# Patient Record
Sex: Male | Born: 1950 | Race: White | Hispanic: No | Smoking: Current every day smoker
Health system: Southern US, Community
[De-identification: ages and names within clinical notes are randomized; demographics above are authoritative.]

## PROBLEM LIST (undated history)

## (undated) DIAGNOSIS — J90 Pleural effusion, not elsewhere classified: Secondary | ICD-10-CM

## (undated) DIAGNOSIS — M47816 Spondylosis without myelopathy or radiculopathy, lumbar region: Secondary | ICD-10-CM

## (undated) DIAGNOSIS — K429 Umbilical hernia without obstruction or gangrene: Secondary | ICD-10-CM

## (undated) DIAGNOSIS — K5790 Diverticulosis of intestine, part unspecified, without perforation or abscess without bleeding: Secondary | ICD-10-CM

## (undated) DIAGNOSIS — C3490 Malignant neoplasm of unspecified part of unspecified bronchus or lung: Secondary | ICD-10-CM

## (undated) DIAGNOSIS — R0602 Shortness of breath: Secondary | ICD-10-CM

## (undated) DIAGNOSIS — K861 Other chronic pancreatitis: Secondary | ICD-10-CM

## (undated) DIAGNOSIS — I10 Essential (primary) hypertension: Secondary | ICD-10-CM

## (undated) DIAGNOSIS — Z86010 Personal history of colonic polyps: Secondary | ICD-10-CM

## (undated) DIAGNOSIS — C73 Malignant neoplasm of thyroid gland: Secondary | ICD-10-CM

## (undated) DIAGNOSIS — K219 Gastro-esophageal reflux disease without esophagitis: Secondary | ICD-10-CM

## (undated) DIAGNOSIS — Z860101 Personal history of adenomatous and serrated colon polyps: Secondary | ICD-10-CM

## (undated) DIAGNOSIS — R188 Other ascites: Secondary | ICD-10-CM

## (undated) DIAGNOSIS — Z9981 Dependence on supplemental oxygen: Secondary | ICD-10-CM

## (undated) DIAGNOSIS — K589 Irritable bowel syndrome without diarrhea: Secondary | ICD-10-CM

## (undated) DIAGNOSIS — I509 Heart failure, unspecified: Secondary | ICD-10-CM

## (undated) HISTORY — DX: Malignant neoplasm of thyroid gland: C73

## (undated) HISTORY — DX: Spondylosis without myelopathy or radiculopathy, lumbar region: M47.816

## (undated) HISTORY — DX: Personal history of adenomatous and serrated colon polyps: Z86.0101

## (undated) HISTORY — DX: Umbilical hernia without obstruction or gangrene: K42.9

## (undated) HISTORY — DX: Gastro-esophageal reflux disease without esophagitis: K21.9

## (undated) HISTORY — DX: Hemochromatosis, unspecified: E83.119

## (undated) HISTORY — DX: Diverticulosis of intestine, part unspecified, without perforation or abscess without bleeding: K57.90

## (undated) HISTORY — DX: Pleural effusion, not elsewhere classified: J90

## (undated) HISTORY — DX: Irritable bowel syndrome, unspecified: K58.9

## (undated) HISTORY — DX: Malignant neoplasm of unspecified part of unspecified bronchus or lung: C34.90

## (undated) HISTORY — DX: Essential (primary) hypertension: I10

## (undated) HISTORY — DX: Personal history of colonic polyps: Z86.010

## (undated) HISTORY — DX: Other chronic pancreatitis: K86.1

---
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
## 1981-11-17 HISTORY — PX: LUMBAR LAMINECTOMY: SHX95

## 1998-04-04 ENCOUNTER — Encounter: Admission: RE | Admit: 1998-04-04 | Discharge: 1998-07-03 | Payer: Self-pay | Admitting: Family Medicine

## 2001-01-19 ENCOUNTER — Ambulatory Visit (HOSPITAL_COMMUNITY): Admission: RE | Admit: 2001-01-19 | Discharge: 2001-01-19 | Payer: Self-pay | Admitting: Oncology

## 2001-04-09 ENCOUNTER — Encounter (HOSPITAL_COMMUNITY): Admission: RE | Admit: 2001-04-09 | Discharge: 2001-07-08 | Payer: Self-pay | Admitting: Oncology

## 2001-07-13 ENCOUNTER — Encounter: Payer: Self-pay | Admitting: Family Medicine

## 2001-07-13 ENCOUNTER — Encounter: Admission: RE | Admit: 2001-07-13 | Discharge: 2001-07-13 | Payer: Self-pay | Admitting: Family Medicine

## 2001-09-13 ENCOUNTER — Encounter (HOSPITAL_COMMUNITY): Admission: RE | Admit: 2001-09-13 | Discharge: 2001-12-12 | Payer: Self-pay | Admitting: Oncology

## 2001-11-17 HISTORY — PX: OTHER SURGICAL HISTORY: SHX169

## 2002-02-24 ENCOUNTER — Encounter (HOSPITAL_COMMUNITY): Admission: RE | Admit: 2002-02-24 | Discharge: 2002-05-25 | Payer: Self-pay | Admitting: Oncology

## 2002-05-25 ENCOUNTER — Encounter (HOSPITAL_COMMUNITY): Admission: RE | Admit: 2002-05-25 | Discharge: 2002-08-23 | Payer: Self-pay | Admitting: Oncology

## 2002-10-07 ENCOUNTER — Encounter (HOSPITAL_COMMUNITY): Admission: RE | Admit: 2002-10-07 | Discharge: 2003-01-05 | Payer: Self-pay | Admitting: Oncology

## 2003-02-01 ENCOUNTER — Encounter: Payer: Self-pay | Admitting: Family Medicine

## 2003-02-01 ENCOUNTER — Encounter: Admission: RE | Admit: 2003-02-01 | Discharge: 2003-02-01 | Payer: Self-pay | Admitting: Family Medicine

## 2003-03-01 ENCOUNTER — Encounter (HOSPITAL_COMMUNITY): Admission: RE | Admit: 2003-03-01 | Discharge: 2003-05-30 | Payer: Self-pay | Admitting: Oncology

## 2003-09-21 ENCOUNTER — Encounter (HOSPITAL_COMMUNITY): Admission: RE | Admit: 2003-09-21 | Discharge: 2003-12-20 | Payer: Self-pay | Admitting: Oncology

## 2004-01-04 ENCOUNTER — Encounter (HOSPITAL_COMMUNITY): Admission: RE | Admit: 2004-01-04 | Discharge: 2004-04-03 | Payer: Self-pay | Admitting: Oncology

## 2004-02-27 ENCOUNTER — Encounter: Admission: RE | Admit: 2004-02-27 | Discharge: 2004-02-27 | Payer: Self-pay | Admitting: Family Medicine

## 2004-05-01 ENCOUNTER — Ambulatory Visit (HOSPITAL_COMMUNITY): Admission: RE | Admit: 2004-05-01 | Discharge: 2004-05-01 | Payer: Self-pay | Admitting: Orthopedic Surgery

## 2004-05-09 ENCOUNTER — Encounter (HOSPITAL_COMMUNITY): Admission: RE | Admit: 2004-05-09 | Discharge: 2004-08-07 | Payer: Self-pay | Admitting: Oncology

## 2004-05-24 ENCOUNTER — Encounter: Admission: RE | Admit: 2004-05-24 | Discharge: 2004-05-24 | Payer: Self-pay | Admitting: Family Medicine

## 2004-10-03 ENCOUNTER — Encounter (HOSPITAL_COMMUNITY): Admission: RE | Admit: 2004-10-03 | Discharge: 2005-01-01 | Payer: Self-pay | Admitting: Oncology

## 2004-12-16 ENCOUNTER — Ambulatory Visit: Payer: Self-pay | Admitting: Oncology

## 2005-01-14 ENCOUNTER — Encounter (HOSPITAL_COMMUNITY): Admission: RE | Admit: 2005-01-14 | Discharge: 2005-04-14 | Payer: Self-pay | Admitting: Oncology

## 2005-02-13 ENCOUNTER — Ambulatory Visit: Payer: Self-pay | Admitting: Oncology

## 2005-04-28 ENCOUNTER — Ambulatory Visit: Payer: Self-pay | Admitting: Oncology

## 2005-06-27 ENCOUNTER — Ambulatory Visit: Payer: Self-pay | Admitting: Oncology

## 2005-08-28 ENCOUNTER — Encounter (HOSPITAL_COMMUNITY): Admission: RE | Admit: 2005-08-28 | Discharge: 2005-09-15 | Payer: Self-pay | Admitting: Family Medicine

## 2006-02-17 ENCOUNTER — Encounter (HOSPITAL_COMMUNITY): Admission: RE | Admit: 2006-02-17 | Discharge: 2006-05-18 | Payer: Self-pay | Admitting: Family Medicine

## 2006-04-23 ENCOUNTER — Encounter: Admission: RE | Admit: 2006-04-23 | Discharge: 2006-04-23 | Payer: Self-pay | Admitting: Orthopedic Surgery

## 2006-05-22 ENCOUNTER — Encounter (HOSPITAL_COMMUNITY): Admission: RE | Admit: 2006-05-22 | Discharge: 2006-07-02 | Payer: Self-pay | Admitting: Family Medicine

## 2007-02-01 ENCOUNTER — Encounter (HOSPITAL_COMMUNITY): Admission: RE | Admit: 2007-02-01 | Discharge: 2007-04-27 | Payer: Self-pay | Admitting: Family Medicine

## 2007-05-24 ENCOUNTER — Encounter: Admission: RE | Admit: 2007-05-24 | Discharge: 2007-05-24 | Payer: Self-pay | Admitting: Orthopedic Surgery

## 2007-06-22 ENCOUNTER — Encounter (HOSPITAL_COMMUNITY): Admission: RE | Admit: 2007-06-22 | Discharge: 2007-08-23 | Payer: Self-pay | Admitting: Family Medicine

## 2007-09-28 ENCOUNTER — Encounter (HOSPITAL_COMMUNITY): Admission: RE | Admit: 2007-09-28 | Discharge: 2007-11-25 | Payer: Self-pay | Admitting: Family Medicine

## 2007-12-02 ENCOUNTER — Ambulatory Visit: Payer: Self-pay | Admitting: Internal Medicine

## 2007-12-15 ENCOUNTER — Ambulatory Visit: Payer: Self-pay | Admitting: Internal Medicine

## 2007-12-15 ENCOUNTER — Encounter: Payer: Self-pay | Admitting: Internal Medicine

## 2007-12-22 ENCOUNTER — Encounter (HOSPITAL_COMMUNITY): Admission: RE | Admit: 2007-12-22 | Discharge: 2008-03-21 | Payer: Self-pay | Admitting: Family Medicine

## 2008-01-07 ENCOUNTER — Encounter: Admission: RE | Admit: 2008-01-07 | Discharge: 2008-01-07 | Payer: Self-pay | Admitting: Family Medicine

## 2008-05-08 ENCOUNTER — Ambulatory Visit (HOSPITAL_COMMUNITY): Admission: RE | Admit: 2008-05-08 | Discharge: 2008-05-08 | Payer: Self-pay | Admitting: Family Medicine

## 2008-09-06 ENCOUNTER — Encounter (HOSPITAL_COMMUNITY): Admission: RE | Admit: 2008-09-06 | Discharge: 2008-11-16 | Payer: Self-pay | Admitting: Family Medicine

## 2008-12-14 ENCOUNTER — Encounter (HOSPITAL_COMMUNITY): Admission: RE | Admit: 2008-12-14 | Discharge: 2008-12-14 | Payer: Self-pay | Admitting: Family Medicine

## 2009-04-13 ENCOUNTER — Encounter (HOSPITAL_COMMUNITY): Admission: RE | Admit: 2009-04-13 | Discharge: 2009-07-12 | Payer: Self-pay | Admitting: Family Medicine

## 2009-09-07 ENCOUNTER — Encounter (HOSPITAL_COMMUNITY): Admission: RE | Admit: 2009-09-07 | Discharge: 2009-12-06 | Payer: Self-pay | Admitting: Family Medicine

## 2010-04-10 ENCOUNTER — Encounter (HOSPITAL_COMMUNITY): Admission: RE | Admit: 2010-04-10 | Discharge: 2010-05-16 | Payer: Self-pay | Admitting: Family Medicine

## 2010-11-22 ENCOUNTER — Ambulatory Visit (HOSPITAL_COMMUNITY)
Admission: RE | Admit: 2010-11-22 | Discharge: 2010-11-22 | Payer: Self-pay | Source: Home / Self Care | Attending: Family Medicine | Admitting: Family Medicine

## 2011-02-01 LAB — CBC
HCT: 51.2 % (ref 39.0–52.0)
Hemoglobin: 18.5 g/dL — ABNORMAL HIGH (ref 13.0–17.0)
MCHC: 36.1 g/dL — ABNORMAL HIGH (ref 30.0–36.0)
RDW: 12.8 % (ref 11.5–15.5)

## 2011-02-02 LAB — CBC
MCH: 36.5 pg — ABNORMAL HIGH (ref 26.0–34.0)
MCHC: 35.9 g/dL (ref 30.0–36.0)
MCV: 101.6 fL — ABNORMAL HIGH (ref 78.0–100.0)
Platelets: 204 10*3/uL (ref 150–400)
RDW: 13.5 % (ref 11.5–15.5)
WBC: 9.8 10*3/uL (ref 4.0–10.5)

## 2011-02-11 ENCOUNTER — Encounter (HOSPITAL_COMMUNITY)
Admission: RE | Admit: 2011-02-11 | Discharge: 2011-02-11 | Disposition: A | Discharge status: Home / Self Care | Payer: BC Managed Care – PPO | Source: Ambulatory Visit | Attending: Family Medicine | Admitting: Family Medicine

## 2011-02-25 LAB — CBC
MCHC: 35.6 g/dL (ref 30.0–36.0)
Platelets: 214 10*3/uL (ref 150–400)
RDW: 13.3 % (ref 11.5–15.5)

## 2011-02-25 LAB — FERRITIN: Ferritin: 731 ng/mL — ABNORMAL HIGH (ref 22–322)

## 2011-03-03 LAB — CBC
Platelets: 234 10*3/uL (ref 150–400)
WBC: 8.6 10*3/uL (ref 4.0–10.5)

## 2011-03-03 LAB — FERRITIN: Ferritin: 951 ng/mL — ABNORMAL HIGH (ref 22–322)

## 2011-07-01 ENCOUNTER — Ambulatory Visit (HOSPITAL_BASED_OUTPATIENT_CLINIC_OR_DEPARTMENT_OTHER)
Admission: RE | Admit: 2011-07-01 | Discharge: 2011-07-01 | Disposition: A | Discharge status: Home / Self Care | Payer: BC Managed Care – PPO | Source: Ambulatory Visit | Attending: Urology | Admitting: Urology

## 2011-07-01 ENCOUNTER — Other Ambulatory Visit: Payer: Self-pay | Admitting: Urology

## 2011-07-01 DIAGNOSIS — Z01812 Encounter for preprocedural laboratory examination: Secondary | ICD-10-CM | POA: Insufficient documentation

## 2011-07-01 DIAGNOSIS — N368 Other specified disorders of urethra: Secondary | ICD-10-CM | POA: Insufficient documentation

## 2011-07-01 DIAGNOSIS — Z01810 Encounter for preprocedural cardiovascular examination: Secondary | ICD-10-CM | POA: Insufficient documentation

## 2011-07-01 DIAGNOSIS — N329 Bladder disorder, unspecified: Secondary | ICD-10-CM | POA: Insufficient documentation

## 2011-07-01 LAB — POCT I-STAT 4, (NA,K, GLUC, HGB,HCT)
Glucose, Bld: 125 mg/dL — ABNORMAL HIGH (ref 70–99)
Potassium: 3.3 mEq/L — ABNORMAL LOW (ref 3.5–5.1)

## 2011-08-08 LAB — CBC
HCT: 50.5
MCHC: 35.5
MCV: 97.9
Platelets: 264
RBC: 5.15

## 2011-08-08 LAB — FERRITIN: Ferritin: 437 — ABNORMAL HIGH (ref 22–322)

## 2011-08-14 LAB — CBC
HCT: 51.5
MCHC: 35.1
Platelets: 221
RDW: 13.8

## 2011-08-18 LAB — CBC
MCHC: 35
Platelets: 236
RBC: 5.12

## 2011-08-18 LAB — FERRITIN: Ferritin: 806 — ABNORMAL HIGH (ref 22–322)

## 2011-08-26 LAB — FERRITIN: Ferritin: 417 — ABNORMAL HIGH (ref 22–322)

## 2011-08-26 LAB — CBC
HCT: 50
Platelets: 254
RDW: 13.6
WBC: 9.1

## 2011-09-01 LAB — CBC
Hemoglobin: 18.5 — ABNORMAL HIGH
MCHC: 35
RBC: 5.5
WBC: 9.1

## 2011-09-01 LAB — FERRITIN: Ferritin: 362 — ABNORMAL HIGH (ref 22–322)

## 2011-12-18 ENCOUNTER — Encounter (HOSPITAL_COMMUNITY)
Admission: RE | Admit: 2011-12-18 | Discharge: 2011-12-18 | Disposition: A | Discharge status: Home / Self Care | Payer: BC Managed Care – PPO | Source: Ambulatory Visit | Attending: Family Medicine | Admitting: Family Medicine

## 2011-12-18 ENCOUNTER — Other Ambulatory Visit (HOSPITAL_COMMUNITY): Payer: Self-pay | Admitting: *Deleted

## 2011-12-18 NOTE — Progress Notes (Signed)
One unit theraputic phlebotomy completed usaing left antecubital fossoe; pt tolerated well.

## 2012-01-30 ENCOUNTER — Encounter (HOSPITAL_COMMUNITY)
Admission: RE | Admit: 2012-01-30 | Discharge: 2012-01-30 | Disposition: A | Discharge status: Home / Self Care | Payer: BC Managed Care – PPO | Source: Ambulatory Visit | Attending: Family Medicine | Admitting: Family Medicine

## 2012-01-30 NOTE — Progress Notes (Signed)
Patient refuses to answer questions regarding abuse,suicide, or falls. States that those questions are "awfully personal and I don't think you should be asking them".

## 2012-01-30 NOTE — Progress Notes (Signed)
Pt. States that he feels fine and is ready to go home. States that he took his bp medicine a little while ago. Gait steady. Accepted a Coke to take with him. Did not want to sit longer to see if bp came back up.

## 2012-04-01 ENCOUNTER — Encounter (HOSPITAL_COMMUNITY)
Admission: RE | Admit: 2012-04-01 | Discharge: 2012-04-01 | Disposition: A | Discharge status: Home / Self Care | Payer: BC Managed Care – PPO | Source: Ambulatory Visit | Attending: Family Medicine | Admitting: Family Medicine

## 2012-04-01 NOTE — Progress Notes (Addendum)
1315 500 cc phlebotomy right anterior forearm.  Patient tolerated well.  Taking pos. Labs received from Dr Diamantina [facility] office showing hemoglobin of 18.1 on 01/20/2012

## 2012-05-11 ENCOUNTER — Other Ambulatory Visit (HOSPITAL_COMMUNITY): Payer: Self-pay | Admitting: *Deleted

## 2012-05-12 ENCOUNTER — Encounter (HOSPITAL_COMMUNITY)
Admission: RE | Admit: 2012-05-12 | Discharge: 2012-05-12 | Disposition: A | Discharge status: Home / Self Care | Payer: BC Managed Care – PPO | Source: Ambulatory Visit | Attending: Family Medicine | Admitting: Family Medicine

## 2012-05-12 LAB — CBC
RBC: 5.04 MIL/uL (ref 4.22–5.81)
RDW: 13.3 % (ref 11.5–15.5)
WBC: 7.3 10*3/uL (ref 4.0–10.5)

## 2012-05-12 LAB — DIFFERENTIAL
Basophils Absolute: 0.1 10*3/uL (ref 0.0–0.1)
Basophils Relative: 1 % (ref 0–1)
Eosinophils Absolute: 0.4 10*3/uL (ref 0.0–0.7)
Eosinophils Relative: 5 % (ref 0–5)
Monocytes Absolute: 0.7 10*3/uL (ref 0.1–1.0)
Monocytes Relative: 10 % (ref 3–12)

## 2012-05-12 NOTE — Progress Notes (Addendum)
1230 instructions received to perform phlebotomy if hgb >17.  1245  500cc phlebotomy from right mid arm per patients request.  Tolerated well. Taking po fluids

## 2012-11-08 ENCOUNTER — Encounter (HOSPITAL_COMMUNITY): Payer: BC Managed Care – PPO

## 2012-11-08 ENCOUNTER — Other Ambulatory Visit (HOSPITAL_COMMUNITY): Payer: Self-pay | Admitting: *Deleted

## 2012-11-22 ENCOUNTER — Encounter: Payer: Self-pay | Admitting: Internal Medicine

## 2012-12-08 ENCOUNTER — Encounter: Payer: Self-pay | Admitting: Internal Medicine

## 2012-12-08 ENCOUNTER — Ambulatory Visit (AMBULATORY_SURGERY_CENTER): Payer: BC Managed Care – PPO | Admitting: *Deleted

## 2012-12-08 VITALS — Ht 72.0 in | Wt 207.0 lb

## 2012-12-08 DIAGNOSIS — Z86010 Personal history of colonic polyps: Secondary | ICD-10-CM

## 2012-12-08 DIAGNOSIS — Z1211 Encounter for screening for malignant neoplasm of colon: Secondary | ICD-10-CM

## 2012-12-08 MED ORDER — MOVIPREP 100 G PO SOLR: ORAL | Status: DC

## 2012-12-20 ENCOUNTER — Telehealth: Payer: Self-pay | Admitting: Internal Medicine

## 2012-12-20 DIAGNOSIS — Z1211 Encounter for screening for malignant neoplasm of colon: Secondary | ICD-10-CM

## 2012-12-20 MED ORDER — MOVIPREP 100 G PO SOLR: ORAL | Status: DC

## 2012-12-20 NOTE — Telephone Encounter (Signed)
Resent Moviprep to [facility] per pt's request. Did speak with patient at this time. He understands it was sent.

## 2012-12-21 ENCOUNTER — Encounter: Payer: Self-pay | Admitting: Internal Medicine

## 2012-12-21 ENCOUNTER — Ambulatory Visit (AMBULATORY_SURGERY_CENTER): Payer: BC Managed Care – PPO | Admitting: Internal Medicine

## 2012-12-21 VITALS — BP 149/95 | HR 79 | Temp 97.0°F | Resp 25 | Ht 72.0 in | Wt 207.0 lb

## 2012-12-21 DIAGNOSIS — Z1211 Encounter for screening for malignant neoplasm of colon: Secondary | ICD-10-CM

## 2012-12-21 DIAGNOSIS — D126 Benign neoplasm of colon, unspecified: Secondary | ICD-10-CM

## 2012-12-21 DIAGNOSIS — Z86010 Personal history of colonic polyps: Secondary | ICD-10-CM

## 2012-12-21 MED ORDER — DICYCLOMINE HCL 10 MG PO CAPS: 20.0000 mg | ORAL_CAPSULE | Freq: Two times a day (BID) | ORAL | Status: DC | PRN

## 2012-12-21 MED ORDER — SODIUM CHLORIDE 0.9 % IV SOLN: 500.0000 mL | INTRAVENOUS | Status: DC

## 2012-12-21 NOTE — Op Note (Signed)
 [facility] [address] [place], [address]   COLONOSCOPY PROCEDURE REPORT  PATIENT: Henry [name], Henry [name]  MR#: [id number] BIRTHDATE: [birth year] , 61  yrs. old GENDER: Male ENDOSCOPIST: Hart Carwin, MD REFERRED BY:  Lupe Carney, M.D. PROCEDURE DATE:  12/21/2012 PROCEDURE:   Colonoscopy with cold biopsy polypectomy and Colonoscopy with snare polypectomy ASA CLASS:   Class III INDICATIONS:Patient's personal history of adenomatous colon polyps and 1988adenoma,1997-hyperplastic,2002-hyperplastic,2009-adenomas.  MEDICATIONS: MAC sedation, administered by CRNA, Fentanyl-Quick Pick, and propofol (Diprivan) 450mg  IV  DESCRIPTION OF PROCEDURE:   After the risks and benefits and of the procedure were explained, informed consent was obtained.  A digital rectal exam revealed no abnormalities of the rectum.    The LB CF-[id number]  endoscope was introduced through the anus and advanced to the cecum, which was identified by both the appendix and ileocecal valve .  The quality of the prep was good, using MoviPrep .  The instrument was then slowly withdrawn as the colon was fully examined.     COLON FINDINGS: Four smooth semi-pedunculated polyps ranging between 3-13mm in size were found throughout the entire examined colon. at 120cm, 80cm, 25cm and 20cm.  A polypectomy was performed with cold forceps x2  and with a cold snare. x2   The resection was complete and the polyp tissue was completely retrieved.     Retroflexed views revealed no abnormalities. there was moderrate;yu severe diverticulosis of the left colon.    The scope was then withdrawn from the patient and the procedure completed.  COMPLICATIONS: There were no complications. ENDOSCOPIC IMPRESSION: Four semi-pedunculated polyps ranging between 3-81mm in size were found throughout the entire examined colon; polypectomy was performed with cold forceps and with a cold snare moderately severe diverticulosis of  the left colon spasm of the left colon  RECOMMENDATIONS: Await pathology results Bentyl 20 mg po bid   REPEAT EXAM: In 5 year(s)  for Colonoscopy.  cc:  _______________________________ eSignedHart Carwin, MD 12/21/2012 9:19 AM     PATIENT NAME:  Henry [name], Henry [name] MR#: [id number]

## 2012-12-21 NOTE — Patient Instructions (Addendum)
YOU HAD AN ENDOSCOPIC PROCEDURE TODAY AT THE [facility]: Refer to the procedure report that was given to you for any specific questions about what was found during the examination.  If the procedure report does not answer your questions, please call your gastroenterologist to clarify.  If you requested that your care partner not be given the details of your procedure findings, then the procedure report has been included in a sealed envelope for you to review at your convenience later.  YOU SHOULD EXPECT: Some feelings of bloating in the abdomen. Passage of more gas than usual.  Walking can help get rid of the air that was put into your GI tract during the procedure and reduce the bloating. If you had a lower endoscopy (such as a colonoscopy or flexible sigmoidoscopy) you may notice spotting of blood in your stool or on the toilet paper. If you underwent a bowel prep for your procedure, then you may not have a normal bowel movement for a few days.  DIET: Your first meal following the procedure should be a light meal and then it is ok to progress to your normal diet.  A half-sandwich or bowl of soup is an example of a good first meal.  Heavy or fried foods are harder to digest and may make you feel nauseous or bloated.  Likewise meals heavy in dairy and vegetables can cause extra gas to form and this can also increase the bloating.  Drink plenty of fluids but you should avoid alcoholic beverages for 24 hours.  You do have diverticulosis; therefore, please eat a high fiber diet to prevent diverticulitis.  ACTIVITY: Your care partner should take you home directly after the procedure.  You should plan to take it easy, moving slowly for the rest of the day.  You can resume normal activity the day after the procedure however you should NOT DRIVE or use heavy machinery for 24 hours (because of the sedation medicines used during the test).    SYMPTOMS TO REPORT IMMEDIATELY: A gastroenterologist can be  reached at any hour.  During normal business hours, 8:30 AM to 5:00 PM Monday through Friday, call [phone].  After hours and on weekends, please call the GI answering service at [phone] who will take a message and have the physician on call contact you.   Following lower endoscopy (colonoscopy or flexible sigmoidoscopy):  Excessive amounts of blood in the stool  Significant tenderness or worsening of abdominal pains  Swelling of the abdomen that is new, acute  Fever of 100F or higher  FOLLOW UP: If any biopsies were taken you will be contacted by phone or by letter within the next 1-3 weeks.  Call your gastroenterologist if you have not heard about the biopsies in 3 weeks.  Our staff will call the home number listed on your records the next business day following your procedure to check on you and address any questions or concerns that you may have at that time regarding the information given to you following your procedure. This is a courtesy call and so if there is no answer at the home number and we have not heard from you through the emergency physician on call, we will assume that you have returned to your regular daily activities without incident.  Take your bentyl if you have abd pain, and stay on a liquid diet that day per Dr. Juanda Chance.  SIGNATURES/CONFIDENTIALITY: You and/or your care partner have signed paperwork which will be entered into  your electronic medical record.  These signatures attest to the fact that that the information above on your After Visit Summary has been reviewed and is understood.  Full responsibility of the confidentiality of this discharge information lies with you and/or your care-partner.   Thank-you for choosing [facility] for your healthcare needs.

## 2012-12-21 NOTE — Progress Notes (Addendum)
Patient did not have preoperative order for IV antibiotic SSI prophylaxis. (G8918)  Patient did not experience any of the following events: a burn prior to discharge; a fall within the facility; wrong site/side/patient/procedure/implant event; or a hospital transfer or hospital admission upon discharge from the facility. (G8907)  

## 2012-12-21 NOTE — Progress Notes (Signed)
Called to room to assist during endoscopic procedure.  Patient ID and intended procedure confirmed with present staff. Received instructions for my participation in the procedure from the performing physician.  

## 2012-12-22 ENCOUNTER — Telehealth: Payer: Self-pay | Admitting: *Deleted

## 2012-12-22 NOTE — Telephone Encounter (Signed)
  Follow up Call-  Call back number 12/21/2012  Post procedure Call Back phone  # [phone]  Permission to leave phone message Yes     Patient questions:  Do you have a fever, pain , or abdominal swelling? yes Pain Score  0 *  Have you tolerated food without any problems? yes  Have you been able to return to your normal activities? yes  Do you have any questions about your discharge instructions: Diet   no Medications  no Follow up visit  no  Do you have questions or concerns about your Care? no  Actions: * If pain score is 4 or above: No action needed, pain <4.  Patient c/o intermitting localized abdomen pain as before exam. He states it's worse when he lays down and it's happen for about 2 weeks. He did tell Dr.Brodie. He has not started taking the Bentyl as prescribed. Encouraged patient to get this medication and try it. If still no improvement to call Dr.Brodie and let her know. He understands.

## 2012-12-27 ENCOUNTER — Encounter: Payer: Self-pay | Admitting: Internal Medicine

## 2013-01-28 ENCOUNTER — Telehealth: Payer: Self-pay | Admitting: Internal Medicine

## 2013-01-28 NOTE — Telephone Encounter (Signed)
Patient states he is still having pain in his stomach above the belly button. States he wakes up and feels fine, then when he eats he feels bad. Later, in the day, he feels okay again. He has noticed the pain is worse if he eats eggs. He reports a tick bite last year and is wondering if this caused Lyme disease. He would like to be seen ASAP. Scheduled with [name], PA on 01/31/13 at 10:30 AM.

## 2013-01-31 ENCOUNTER — Encounter: Payer: Self-pay | Admitting: Physician Assistant

## 2013-01-31 ENCOUNTER — Other Ambulatory Visit (INDEPENDENT_AMBULATORY_CARE_PROVIDER_SITE_OTHER): Payer: BC Managed Care – PPO

## 2013-01-31 ENCOUNTER — Ambulatory Visit (INDEPENDENT_AMBULATORY_CARE_PROVIDER_SITE_OTHER): Payer: BC Managed Care – PPO | Admitting: Physician Assistant

## 2013-01-31 VITALS — BP 142/80 | HR 85 | Ht 72.0 in | Wt 207.2 lb

## 2013-01-31 DIAGNOSIS — I10 Essential (primary) hypertension: Secondary | ICD-10-CM

## 2013-01-31 DIAGNOSIS — R1013 Epigastric pain: Secondary | ICD-10-CM

## 2013-01-31 DIAGNOSIS — Z860100 Personal history of colon polyps, unspecified: Secondary | ICD-10-CM

## 2013-01-31 DIAGNOSIS — Z860101 Personal history of adenomatous and serrated colon polyps: Secondary | ICD-10-CM | POA: Insufficient documentation

## 2013-01-31 LAB — FERRITIN: Ferritin: 195.2 ng/mL (ref 22.0–322.0)

## 2013-01-31 LAB — COMPREHENSIVE METABOLIC PANEL
ALT: 35 U/L (ref 0–53)
AST: 24 U/L (ref 0–37)
Albumin: 4.2 g/dL (ref 3.5–5.2)
Alkaline Phosphatase: 53 U/L (ref 39–117)
BUN: 12 mg/dL (ref 6–23)
Calcium: 9.2 mg/dL (ref 8.4–10.5)
Chloride: 101 mEq/L (ref 96–112)
Potassium: 3.3 mEq/L — ABNORMAL LOW (ref 3.5–5.1)
Sodium: 138 mEq/L (ref 135–145)

## 2013-01-31 LAB — CBC WITH DIFFERENTIAL/PLATELET
Basophils Absolute: 0.1 10*3/uL (ref 0.0–0.1)
Eosinophils Absolute: 0.4 10*3/uL (ref 0.0–0.7)
Lymphocytes Relative: 30 % (ref 12.0–46.0)
MCHC: 35.3 g/dL (ref 30.0–36.0)
MCV: 96.3 fl (ref 78.0–100.0)
Monocytes Absolute: 1 10*3/uL (ref 0.1–1.0)
Neutrophils Relative %: 52.7 % (ref 43.0–77.0)
Platelets: 227 10*3/uL (ref 150.0–400.0)
RBC: 5.06 Mil/uL (ref 4.22–5.81)
RDW: 13.6 % (ref 11.5–14.6)

## 2013-01-31 MED ORDER — ESOMEPRAZOLE MAGNESIUM 40 MG PO CPDR: 40.0000 mg | DELAYED_RELEASE_CAPSULE | Freq: Every day | ORAL | Status: DC

## 2013-01-31 NOTE — Progress Notes (Signed)
Reviewed, CT scan for starters, then decide about further work up, agree with a PPI.

## 2013-01-31 NOTE — Progress Notes (Signed)
Subjective:    Patient ID: Henry [name], male    DOB: [birth year], 62 y.o.   MRN: [id number]  HPI Henry [name] is a 62 year old white male known recently to Henry [name] from colonoscopy done in 12/21/2012. He was found to have 4 polyps all of which were removed and these were tubular adenomas in the 3-7 mm size range. He was also noted to have moderately 62 severe diverticular disease in the left colon. She relates history of hypertension and hemachromatosis. He says he had been followed in the past by Dr. Oletta [name] and had undergone phlebotomies in past years but admits that he really has not been keeping up with this recently. Currently he's getting his labs checked by Henry [name] his primary care physician. He comes in today with complaints of upper abdominal pain which started in December of 2013. He says initially the pain was intermittent and more of a bloated uncomfortable feeling in now with time has gotten progressively worse and more constant. He describes it as a crampy soreness that gets more intense at times. He says she's been waking up with his mid back hurting off-and-on as well over the past couple of months and gets up and put heating pad on. He says he has had lower lumbar disc problems but does not of any disc problems in his mid back as far as he is aware. He says his pain is usually worse after eating breakfast may last for several hours in the morning and and is usually somewhat better later in the afternoon into the evening. Has not had any nausea his appetite is fair he lost about 18 pounds over the past year but says he had been trying to lose some weight. His bowel habits have been regular no melena or hematochezia. Is not had any abdominal surgery, he does not use any regular aspirin or NSAIDs, he does admit to drinking alcohol on a daily basis but says usually just 3 or 4 beers per day.    Review of Systems  Constitutional: Positive for appetite change and unexpected weight  change.  HENT: Negative.   Eyes: Negative.   Respiratory: Negative.   Cardiovascular: Negative.   Gastrointestinal: Positive for abdominal pain and abdominal distention.  Endocrine: Negative.   Genitourinary: Negative.   Allergic/Immunologic: Negative.   Neurological: Negative.   Hematological: Negative.   Psychiatric/Behavioral: Negative.    Outpatient Prescriptions Prior to Visit  Medication Sig Dispense Refill  . diltiazem (CARDIZEM CD) 360 MG 24 hr capsule Take 360 mg by mouth daily.      . hydrochlorothiazide (HYDRODIURIL) 50 MG tablet Take 50 mg by mouth daily.      Marland Kitchen POTASSIUM CITRATE PO Take 2 tablets by mouth daily.      . Valsartan (DIOVAN PO) Take by mouth daily.      Marland Kitchen dicyclomine (BENTYL) 10 MG capsule Take 2 capsules (20 mg total) by mouth 2 (two) times daily as needed.  60 capsule  2  . hyoscyamine (LEVSIN, ANASPAZ) 0.125 MG tablet Take 0.125 mg by mouth every 4 (four) hours as needed.       No facility-administered medications prior to visit.   No Known Allergies Patient Active Problem List  Diagnosis  . HTN (hypertension)  . Hx of adenomatous colonic polyps  . Hemochromatosis   History  Substance Use Topics  . Smoking status: Current Every Day Smoker -- 2.00 packs/day    Types: Cigarettes  . Smokeless tobacco: Never Used  . Alcohol Use:  12.6 oz/week    21 Cans of beer per week   family history includes Colon cancer (age of onset: 26) in his maternal grandmother and Stomach cancer (age of onset: 22) in his paternal grandfather.     Objective:   Physical Exam  well-developed white male in no acute distress blood pressure 142/80 pulse 85 height 6 foot weight 207. HEENT; nontraumatic normocephalic EOMI PERRLA sclera anicteric complexion somewhat ruddy,Neck; Supple no JVD, Cardiovascular; regular rate and rhythm with S1-S2 no murmur or gallop, Pulmonary; clear bilaterally Abdomen soft and is mildly tender in the epigastrium and right upper quadrant there is  slight fullness in the right upper quadrant but no definite hepatomegaly or mass, no palpable splenomegaly, bowel sounds are active. Rectal; exam not done, Extremities; no clubbing, cyanosis ,or edema skin warm and dry, Psych; mood and affect normal and appropriate.        Assessment & Plan:  #62  62 year old male with 3 month history of upper abdominal pain sometimes exacerbated postprandially and with decreased appetite. Etiology of his symptoms is not clear and will need further workup #2 history of adenomatous colon polyps-just had colonoscopy February 2014 #3 diverticulosis #4 hemachromatosis #5 HTN #6 daily EtOH use  Plan; will give him a trial of Nexium 40 mg by mouth every morning over the next 2 weeks Check CBC with diff, CMET,Lipase, and ferritin level Schedule for CT scan of the abdomen and pelvis with contrast Further plans pending results of above-if unrevealing will need EGD with HenryBrodie

## 2013-01-31 NOTE — Patient Instructions (Addendum)
You have been given a separate informational sheet regarding your tobacco use, the importance of quitting and local resources to help you quit. Please go to the basement level to have your labs drawn.    You have been scheduled for a CT scan of the abdomen and pelvis at [facility] ([address]---this is in the same building as Architectural technologist).   You are scheduled on 02-04-2013 Friday at 9:00 am  You should arrive at 8:45 am  prior to your appointment time for registration. Please follow the written instructions below on the day of your exam:  WARNING: IF YOU ARE ALLERGIC TO IODINE/X-RAY DYE, PLEASE NOTIFY RADIOLOGY IMMEDIATELY AT [phone]! YOU WILL BE GIVEN A 13 HOUR PREMEDICATION PREP.  1) Do not eat or drink anything after 5:00 am  (4 hours prior to your test) 2) You have been given 2 bottles of oral contrast to drink. The solution may taste  better if refrigerated, but do NOT add ice or any other liquid to this solution. Shake             well before drinking.    Drink 1 bottle of contrast @ 7:00 am  (2 hours prior to your exam)  Drink 1 bottle of contrast @8 :00 am  (1 hour prior to your exam)  You may take any medications as prescribed with a small amount of water except for the following: Metformin, Glucophage, Glucovance, Avandamet, Riomet, Fortamet, Actoplus Met, Janumet, Glumetza or Metaglip. The above medications must be held the day of the exam AND 48 hours after the exam.  The purpose of you drinking the oral contrast is to aid in the visualization of your intestinal tract. The contrast solution may cause some diarrhea. Before your exam is started, you will be given a small amount of fluid to drink. Depending on your individual set of symptoms, you may also receive an intravenous injection of x-ray contrast/dye. Plan on being at [facility] for 30 minutes or long, depending on the type of exam you are having performed.  If you have any questions  regarding your exam or if you need to reschedule, you may call the CT department at [phone] between the hours of 8:00 am and 5:00 pm, Monday-Friday.  ________________________________________________________________________

## 2013-02-04 ENCOUNTER — Encounter: Payer: Self-pay | Admitting: *Deleted

## 2013-02-04 ENCOUNTER — Ambulatory Visit (INDEPENDENT_AMBULATORY_CARE_PROVIDER_SITE_OTHER)
Admission: RE | Admit: 2013-02-04 | Discharge: 2013-02-04 | Disposition: A | Discharge status: Home / Self Care | Payer: BC Managed Care – PPO | Source: Ambulatory Visit | Attending: Physician Assistant | Admitting: Physician Assistant

## 2013-02-04 DIAGNOSIS — R1013 Epigastric pain: Secondary | ICD-10-CM

## 2013-02-04 MED ORDER — IOHEXOL 300 MG/ML  SOLN
100.0000 mL | Freq: Once | INTRAMUSCULAR | Status: AC | PRN
  Administered 2013-02-04: 100 mL via INTRAVENOUS

## 2013-02-07 ENCOUNTER — Other Ambulatory Visit: Payer: Self-pay | Admitting: *Deleted

## 2013-02-07 DIAGNOSIS — R634 Abnormal weight loss: Secondary | ICD-10-CM

## 2013-02-10 ENCOUNTER — Encounter: Payer: Self-pay | Admitting: *Deleted

## 2013-02-11 ENCOUNTER — Ambulatory Visit (HOSPITAL_COMMUNITY)
Admission: RE | Admit: 2013-02-11 | Discharge: 2013-02-11 | Disposition: A | Discharge status: Home / Self Care | Payer: BC Managed Care – PPO | Source: Ambulatory Visit | Attending: Physician Assistant | Admitting: Physician Assistant

## 2013-02-11 DIAGNOSIS — R109 Unspecified abdominal pain: Secondary | ICD-10-CM | POA: Insufficient documentation

## 2013-02-11 DIAGNOSIS — R160 Hepatomegaly, not elsewhere classified: Secondary | ICD-10-CM | POA: Insufficient documentation

## 2013-02-11 DIAGNOSIS — R634 Abnormal weight loss: Secondary | ICD-10-CM

## 2013-02-11 DIAGNOSIS — R935 Abnormal findings on diagnostic imaging of other abdominal regions, including retroperitoneum: Secondary | ICD-10-CM | POA: Insufficient documentation

## 2013-02-11 DIAGNOSIS — K7689 Other specified diseases of liver: Secondary | ICD-10-CM | POA: Insufficient documentation

## 2013-02-11 DIAGNOSIS — N281 Cyst of kidney, acquired: Secondary | ICD-10-CM | POA: Insufficient documentation

## 2013-02-11 MED ORDER — GADOBENATE DIMEGLUMINE 529 MG/ML IV SOLN
20.0000 mL | Freq: Once | INTRAVENOUS | Status: AC | PRN
  Administered 2013-02-11: 20 mL via INTRAVENOUS

## 2013-02-22 ENCOUNTER — Ambulatory Visit (INDEPENDENT_AMBULATORY_CARE_PROVIDER_SITE_OTHER): Payer: BC Managed Care – PPO | Admitting: Internal Medicine

## 2013-02-22 ENCOUNTER — Encounter: Payer: Self-pay | Admitting: Internal Medicine

## 2013-02-22 ENCOUNTER — Other Ambulatory Visit (INDEPENDENT_AMBULATORY_CARE_PROVIDER_SITE_OTHER): Payer: BC Managed Care – PPO

## 2013-02-22 VITALS — BP 130/88 | HR 84 | Ht 72.0 in | Wt 200.4 lb

## 2013-02-22 DIAGNOSIS — R933 Abnormal findings on diagnostic imaging of other parts of digestive tract: Secondary | ICD-10-CM

## 2013-02-22 MED ORDER — OMEPRAZOLE 40 MG PO CPDR: 40.0000 mg | DELAYED_RELEASE_CAPSULE | Freq: Every day | ORAL | Status: DC

## 2013-02-22 MED ORDER — DICLOFENAC SODIUM 75 MG PO TBEC: 75.0000 mg | DELAYED_RELEASE_TABLET | Freq: Every day | ORAL | Status: DC

## 2013-02-22 NOTE — Progress Notes (Signed)
Henry [name] Aug 11, 1951 MRN [id number]   History of Present Illness:  This is a 62 year old white male who is here for followup after seeing Henry Gip, PA-C in March 2014 for upper abdominal pain.  He drinks excess alcohol and takes anti-inflammatory agents. He was put on Nexium which he discontinued after 1 week when his symptoms improved. We did a colonoscopy in February 2014 on him with findings of moderately severe diverticulosis and 4 polyps which were tubular adenomas. 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 His last ferritin was 195, liver function tests were normal except for a total bilirubin of 1.4. An MRI of the abdomen in March of this year showed a 22.3 cm liver consistent with hepatomegaly. He had a normal spleen. There was abnormal soft tissue encasement of the SMA raising a question of vasculitis with the possibility of pancreatitis or an early neoplastic process. An EUS versus PET scan was suggested. He drinks about a six pack of beer a day.   Past Medical History  Diagnosis Date  . Hypertension   . GERD (gastroesophageal reflux disease)   . Hemochromatosis   . Hx of adenomatous colonic polyps   . Umbilical hernia   . Diverticulosis   . Lumbar spondylosis    Past Surgical History  Procedure Laterality Date  . Lumbar laminectomy  1983  . Knee arthrsoscopy  2003    left  . Tonsillectomy  1962    reports that he has been smoking Cigarettes.  He has been smoking about 2.00 packs per day. He has never used smokeless tobacco. He reports that he drinks about 12.6 ounces of alcohol per week. He reports that he does not use illicit drugs. family history includes Colon cancer (age of onset: 60) in his maternal grandmother and Stomach cancer (age of onset: 40) in his paternal grandfather. No Known Allergies      Review of Systems:  The remainder of the 10 point ROS is negative except as outlined in H&P   Physical Exam: General appearance  Well  developed, in no distress. Eyes- non icteric. HEENT nontraumatic, normocephalic. Mouth no lesions, tongue papillated, no cheilosis. Neck supple without adenopathy, thyroid not enlarged, no carotid bruits, no JVD. Lungs Clear to auscultation bilaterally. Cor normal S1, normal S2, regular rhythm, no murmur,  quiet precordium. Abdomen: Tender epigastrium and left upper quadrant. Rectal: Not done. Extremities no pedal edema. Skin no lesions. Neurological alert and oriented x 3. Psychological normal mood and affect.  Assessment and Plan:  Problem #67 62 year old white male who uses alcohol in excess. He is now having upper abdominal pain and has a radiographic abnormality which raises the question of inflammatory process around his mesentery in the left upper quadrant. We will check his amylase and lipase today. He is reluctant to schedule an EUS at this time or upper endoscopy for that matter. We will give him a trial of diclofenac 75 mg daily and Prilosec 40 mg daily. He will call us in 4 weeks with an update. If the abdominal pain continues, we will schedule him for an upper endoscopy and possible EUS. I have counseled him about stopping alcohol abuse.  Problem #2 Hemochromatosis. Patient's last hemoglobin was 17.2 and Ferritin was 195.  Problem #3 Colorectal screening. Patient is up to date on his colonoscopy. A recall colonoscopy will be due in February 2019.   02/22/2013 Henry [name]

## 2013-02-22 NOTE — Patient Instructions (Addendum)
You have been given a separate informational sheet regarding your tobacco use, the importance of quitting and local resources to help you quit.  Please reduce your alcohol intake  Your physician has requested that you go to the basement for the following lab work before leaving today: Amylase, Lipase  We have sent the following medications to your pharmacy for you to pick up at your convenience: Diclofenac Prilosec  Please call us back in 4 weeks with an update on your condition. If you are not better at that time, we may consider an EUS (Dr Christella Hartigan) due to your Pancreatic inflammation on CT.  CC: Dr Lupe Carney

## 2013-02-23 ENCOUNTER — Other Ambulatory Visit: Payer: Self-pay | Admitting: *Deleted

## 2013-02-23 ENCOUNTER — Other Ambulatory Visit: Payer: Self-pay

## 2013-02-23 ENCOUNTER — Encounter: Payer: Self-pay | Admitting: Gastroenterology

## 2013-02-23 ENCOUNTER — Encounter (HOSPITAL_COMMUNITY): Payer: Self-pay | Admitting: *Deleted

## 2013-02-23 ENCOUNTER — Telehealth: Payer: Self-pay | Admitting: *Deleted

## 2013-02-23 DIAGNOSIS — R932 Abnormal findings on diagnostic imaging of liver and biliary tract: Secondary | ICD-10-CM

## 2013-02-23 DIAGNOSIS — R748 Abnormal levels of other serum enzymes: Secondary | ICD-10-CM

## 2013-02-23 NOTE — Telephone Encounter (Signed)
I agree that EUS is next step but if he declines that is his choice.    Dora, I'll leave repeat labs to you.  Let me know if he changes his mind about EUS. Thanks

## 2013-02-23 NOTE — Telephone Encounter (Signed)
Labs in [facility] for 03/08/13. Left a message for patient to call me.

## 2013-02-23 NOTE — Telephone Encounter (Signed)
Dora I reviewed notes, CT and MRI reports and images.  EUS is a reasonable next step to check for pancreatic process.  Visualization of SMA, ? Soft tissue around SMA may not be very good with EUS however it is worth a try.  Patty, He needs upper EUS, radial +/- linear, ++MAC, next available EUS Thursday.  Thanks

## 2013-02-23 NOTE — Telephone Encounter (Signed)
Please call pt with elevated lipase which suggests pancreatitis. I told him he needs EUS as suggested by Radiologist based on CT scan abnormality. Please set up EUS via [facility] and have DR Christella Hartigan to review. Please tell pt to drastically reduce his alcohol intake if he wants to get better.

## 2013-02-23 NOTE — Telephone Encounter (Signed)
Pt was argumentative with me as well. 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 It is OK to repeat the labs but even if they are back to normal , his pain  Abnormality on CT scan warrant further evaluation. Again, he nees to stop drinking alcohol. I will let Dr Christella Hartigan and Clayborne Dana know. Please repeat the amylase, lipase LFT's in 2 weeks from yesterday.

## 2013-02-23 NOTE — Telephone Encounter (Signed)
Rene Kocher if Dr Juanda Chance gets back to you just let me know if I need to cx appt.

## 2013-02-23 NOTE — Telephone Encounter (Signed)
Pt was called to inform him of the EUS and he states he wants to wait for 2 weeks and come in and have repeat labs.  The pt says he doesn't feel that the procedure is needed. His reasoning is that if the labs return to normal that his pancreas is fine.  I explained that the CT showed an abnormality and radiology suggested that EUS be done.  Pt was very argumentative and insist that he wait and have repeat labs.  Please advise

## 2013-02-24 NOTE — Telephone Encounter (Signed)
Spoke with patient and gave him Dr. Regino Schultze recommendation. He does not want the EUS. He will do labs in 2 weeks. Told patient Dr. Juanda Chance wants him to stop drinking also.

## 2013-03-08 ENCOUNTER — Ambulatory Visit (INDEPENDENT_AMBULATORY_CARE_PROVIDER_SITE_OTHER): Payer: BC Managed Care – PPO

## 2013-03-08 DIAGNOSIS — R748 Abnormal levels of other serum enzymes: Secondary | ICD-10-CM

## 2013-03-08 LAB — HEPATIC FUNCTION PANEL
ALT: 32 U/L (ref 0–53)
Albumin: 3.7 g/dL (ref 3.5–5.2)
Total Bilirubin: 1.1 mg/dL (ref 0.3–1.2)
Total Protein: 6.5 g/dL (ref 6.0–8.3)

## 2013-03-09 ENCOUNTER — Other Ambulatory Visit: Payer: Self-pay

## 2013-03-09 DIAGNOSIS — R748 Abnormal levels of other serum enzymes: Secondary | ICD-10-CM

## 2013-03-09 DIAGNOSIS — R109 Unspecified abdominal pain: Secondary | ICD-10-CM

## 2013-03-09 NOTE — Patient Instructions (Signed)
Pt has been scheduled for EUS 03/31/13 pt has been instructed and meds reviewed Instructions have also been mailed

## 2013-03-10 ENCOUNTER — Encounter (HOSPITAL_COMMUNITY): Admission: RE | Payer: Self-pay | Source: Ambulatory Visit

## 2013-03-10 ENCOUNTER — Ambulatory Visit (HOSPITAL_COMMUNITY)
Admission: RE | Admit: 2013-03-10 | Payer: BC Managed Care – PPO | Source: Ambulatory Visit | Admitting: Gastroenterology

## 2013-03-10 SURGERY — UPPER ENDOSCOPIC ULTRASOUND (EUS) LINEAR
Anesthesia: Monitor Anesthesia Care

## 2013-03-22 ENCOUNTER — Encounter (HOSPITAL_COMMUNITY): Payer: Self-pay | Admitting: Pharmacy Technician

## 2013-03-22 ENCOUNTER — Telehealth: Payer: Self-pay | Admitting: Gastroenterology

## 2013-03-22 NOTE — Telephone Encounter (Signed)
Patient needs to have his ultrasound rescheduled

## 2013-03-22 NOTE — Telephone Encounter (Signed)
Left a message for patient to call me. 

## 2013-03-24 NOTE — Telephone Encounter (Signed)
Patient needs to reschedule his EUS on 03/31/13 due to not having a ride that day. Dr. Christella Hartigan, please, advise.

## 2013-03-24 NOTE — Telephone Encounter (Signed)
Ok, please reschedule it for next available EUS Thursday (not during my hosp week), +MAC (same indication, timing as on previous booking)

## 2013-03-24 NOTE — Telephone Encounter (Signed)
Spoke with Henry [name] in [facility] endo and moved patient to 04/07/13 at 8:30 AM with 7:00 AM arrival. Patient aware.

## 2013-04-07 ENCOUNTER — Ambulatory Visit (HOSPITAL_COMMUNITY): Payer: BC Managed Care – PPO | Admitting: Anesthesiology

## 2013-04-07 ENCOUNTER — Encounter (HOSPITAL_COMMUNITY)
Admission: RE | Disposition: A | Discharge status: Home / Self Care | Payer: Self-pay | Source: Ambulatory Visit | Attending: Gastroenterology

## 2013-04-07 ENCOUNTER — Telehealth: Payer: Self-pay | Admitting: *Deleted

## 2013-04-07 ENCOUNTER — Ambulatory Visit (HOSPITAL_COMMUNITY)
Admission: RE | Admit: 2013-04-07 | Discharge: 2013-04-07 | Disposition: A | Discharge status: Home / Self Care | Payer: BC Managed Care – PPO | Source: Ambulatory Visit | Attending: Gastroenterology | Admitting: Gastroenterology

## 2013-04-07 ENCOUNTER — Encounter: Payer: Self-pay | Admitting: *Deleted

## 2013-04-07 ENCOUNTER — Encounter (HOSPITAL_COMMUNITY): Payer: Self-pay | Admitting: *Deleted

## 2013-04-07 ENCOUNTER — Encounter (HOSPITAL_COMMUNITY): Payer: Self-pay | Admitting: Anesthesiology

## 2013-04-07 DIAGNOSIS — F1020 Alcohol dependence, uncomplicated: Secondary | ICD-10-CM | POA: Insufficient documentation

## 2013-04-07 DIAGNOSIS — R109 Unspecified abdominal pain: Secondary | ICD-10-CM | POA: Insufficient documentation

## 2013-04-07 DIAGNOSIS — R933 Abnormal findings on diagnostic imaging of other parts of digestive tract: Secondary | ICD-10-CM

## 2013-04-07 DIAGNOSIS — R748 Abnormal levels of other serum enzymes: Secondary | ICD-10-CM

## 2013-04-07 DIAGNOSIS — K2990 Gastroduodenitis, unspecified, without bleeding: Secondary | ICD-10-CM | POA: Insufficient documentation

## 2013-04-07 DIAGNOSIS — K219 Gastro-esophageal reflux disease without esophagitis: Secondary | ICD-10-CM | POA: Insufficient documentation

## 2013-04-07 DIAGNOSIS — K2970 Gastritis, unspecified, without bleeding: Secondary | ICD-10-CM | POA: Insufficient documentation

## 2013-04-07 DIAGNOSIS — F17200 Nicotine dependence, unspecified, uncomplicated: Secondary | ICD-10-CM | POA: Insufficient documentation

## 2013-04-07 DIAGNOSIS — R932 Abnormal findings on diagnostic imaging of liver and biliary tract: Secondary | ICD-10-CM

## 2013-04-07 DIAGNOSIS — I10 Essential (primary) hypertension: Secondary | ICD-10-CM | POA: Insufficient documentation

## 2013-04-07 HISTORY — PX: EUS: SHX5427

## 2013-04-07 SURGERY — UPPER ENDOSCOPIC ULTRASOUND (EUS) LINEAR
Anesthesia: Monitor Anesthesia Care

## 2013-04-07 MED ORDER — BUTAMBEN-TETRACAINE-BENZOCAINE 2-2-14 % EX AERO
INHALATION_SPRAY | CUTANEOUS | Status: DC | PRN
  Administered 2013-04-07: 2 via TOPICAL

## 2013-04-07 MED ORDER — KETAMINE HCL 10 MG/ML IJ SOLN
INTRAMUSCULAR | Status: DC | PRN
  Administered 2013-04-07 (×2): 20 mg via INTRAVENOUS

## 2013-04-07 MED ORDER — SODIUM CHLORIDE 0.9 % IV SOLN: INTRAVENOUS | Status: DC

## 2013-04-07 MED ORDER — MIDAZOLAM HCL 5 MG/5ML IJ SOLN
INTRAMUSCULAR | Status: DC | PRN
  Administered 2013-04-07: 2 mg via INTRAVENOUS

## 2013-04-07 MED ORDER — PROMETHAZINE HCL 25 MG/ML IJ SOLN: 6.2500 mg | INTRAMUSCULAR | Status: DC | PRN

## 2013-04-07 MED ORDER — ONDANSETRON HCL 4 MG/2ML IJ SOLN
INTRAMUSCULAR | Status: DC | PRN
  Administered 2013-04-07: 4 mg via INTRAVENOUS

## 2013-04-07 MED ORDER — LACTATED RINGERS IV SOLN
INTRAVENOUS | Status: DC | PRN
  Administered 2013-04-07: 08:00:00 via INTRAVENOUS

## 2013-04-07 MED ORDER — PROPOFOL INFUSION 10 MG/ML OPTIME
INTRAVENOUS | Status: DC | PRN
  Administered 2013-04-07: 120 ug/kg/min via INTRAVENOUS

## 2013-04-07 NOTE — Op Note (Signed)
[facility] [address] [place], [address]   ENDOSCOPIC ULTRASOUND PROCEDURE REPORT PATIENT: Henry [name], Henry [name]  MR#: [id number] BIRTHDATE: 12-30-1950  GENDER: Male ENDOSCOPIST: Rachael Fee, MD REFERRED BY:  Hart Carwin, M.D. PROCEDURE DATE:  04/07/2013 PROCEDURE:   Upper EUS  , EGD with biopsy ASA CLASS:      Class III INDICATIONS:   abdominal pain, chronic alcohol overuse; recent imaging (CT and MR) suggested abnormal soft tissue around SMA. MEDICATIONS: MAC sedation, administered by CRNA DESCRIPTION OF PROCEDURE:   After the risks benefits and alternatives of the procedure were  explained, informed consent was obtained. The patient was then placed in the left, lateral, decubitus postion and IV sedation was administered. Throughout the procedure, the patients blood pressure, pulse and oxygen saturations were monitored continuously.  Under direct visualization, the Pentax Radial EUS [id number]  endoscope was introduced through the mouth  and advanced to the second portion of the duodenum .  Water was used as necessary to provide an acoustic interface.  Upon completion of the imaging, water was removed and the patient was sent to the recovery room in satisfactory condition.  Endoscopic findings (with radial and linear echoendoscopes): 1. Normal esopphagus 2. Mild to moderate distal gastritis, this was biopsied to check for H. pylori 3. Normal duodenum EUS findings: 1. Pancreatic parenchyma was diffusely abnormal; more hypoechoic than usual, lobular overall architecture with hyper echoic strands and foci.  No disrete masses were seen however the sensity of EUS to detect pancreatic neoplasm is lower in the setting of diffuse parenchymal abnormality (chronic pancreatitis). 2. No peripancreatic adenopathy 3. Main pancreatic duct was normal; non-dilated 4. Gallbladder was normal 5. CBD was normal, non-dilated and contained no stones 6. The most proximal 3.5cm  of the SMA was visible on this exam and there was no unusual surrounding soft tissue. The more distal SMA was not visible on this exam. Impression: Diffusely abnormal pancreatic parenchyma due to chronic (likely etoh related) pancreatitis. No discrete pancreatic lesions. The most proximal 3.5cm of SMA was visible and appeared normal without surrounding abnormal soft tissue. The area of concern on CT, MRI may be along more distal SMA that was not visible by EUS.  Should consider PET scan if there is still concern about possible underlying neoplasm along the SMA.   _______________________________ eSigned:  Rachael Fee, MD 04/07/2013 9:32 AM

## 2013-04-07 NOTE — Telephone Encounter (Signed)
Thank you, Jesusita Oka. I will see him in the office And decide if further evaluation warranted. Rene Kocher, please set up an appointment first available. ----- Message ----- From: Rachael Fee, MD Sent: 04/07/2013 9:34 AM To: Hart Carwin, MD Verlee Monte, just completed EUS. See impression below. I also took biopsies from stomach to check for H. Pylori: Diffusely abnormal pancreatic parenchyma due to chronic (likely etoh related) pancreatitis. No discrete pancreatic lesions. The most proximal 3.5cm of SMA was visible and appeared normal without surrounding abnormal soft tissue. The area of concern on CT, MRI may be along more distal SMA that was not visible by EUS. Should consider PET scan if there is still concern about possible underlying neoplasm along the SMA.  Scheduled patient on 05/03/13 at 2:00 PM. Letter mailed.

## 2013-04-07 NOTE — Anesthesia Postprocedure Evaluation (Signed)
  Anesthesia Post-op Note  Patient: Henry [name]  Procedure(s) Performed: Procedure(s) (LRB): UPPER ENDOSCOPIC ULTRASOUND (EUS) LINEAR (N/A)  Patient Location: PACU  Anesthesia Type: MAC  Level of Consciousness: awake and alert   Airway and Oxygen Therapy: Patient Spontanous Breathing  Post-op Pain: mild  Post-op Assessment: Post-op Vital signs reviewed, Patient's Cardiovascular Status Stable, Respiratory Function Stable, Patent Airway and No signs of Nausea or vomiting  Last Vitals:  Filed Vitals:   04/07/13 0940  BP: 144/101  Pulse:   Temp:   Resp: 21    Post-op Vital Signs: stable   Complications: No apparent anesthesia complications

## 2013-04-07 NOTE — Transfer of Care (Signed)
Immediate Anesthesia Transfer of Care Note  Patient: Henry [name]  Procedure(s) Performed: Procedure(s) (LRB): UPPER ENDOSCOPIC ULTRASOUND (EUS) LINEAR (N/A)  Patient Location: PACU  Anesthesia Type: MAC  Level of Consciousness: sedated, patient cooperative and responds to stimulaton  Airway & Oxygen Therapy: Patient Spontanous Breathing and Patient connected to face mask oxgen  Post-op Assessment: Report given to PACU RN and Post -op Vital signs reviewed and stable  Post vital signs: Reviewed and stable  Complications: No apparent anesthesia complications

## 2013-04-07 NOTE — H&P (Signed)
  HPI: This is a man with alcoholism recently found to have abnormal soft tissue around SMA    Past Medical History  Diagnosis Date  . Hypertension   . GERD (gastroesophageal reflux disease)   . Hemochromatosis   . Hx of adenomatous colonic polyps   . Umbilical hernia   . Diverticulosis   . Lumbar spondylosis     ankle    Past Surgical History  Procedure Laterality Date  . Lumbar laminectomy  1983  . Knee arthrsoscopy  2003    left  . Tonsillectomy  1962    Current Facility-Administered Medications  Medication Dose Route Frequency Provider Last Rate Last Dose  . 0.9 %  sodium chloride infusion   Intravenous Continuous Rachael Fee, MD      . 0.9 %  sodium chloride infusion   Intravenous Continuous Rachael Fee, MD      . promethazine [facility]) injection 6.25-12.5 mg  6.25-12.5 mg Intravenous Q15 min PRN Eilene Ghazi, MD        Allergies as of 03/09/2013  . (No Known Allergies)    Family History  Problem Relation Age of Onset  . Colon cancer Maternal Grandmother 60  . Stomach cancer Paternal Grandfather 17    History   Social History  . Marital Status: Widowed    Spouse Name: N/A    Number of Children: 2  . Years of Education: N/A   Occupational History  . PRESIDENT    Social History Main Topics  . Smoking status: Current Every Day Smoker -- 2.00 packs/day for 40 years    Types: Cigarettes  . Smokeless tobacco: Never Used  . Alcohol Use: 12.6 oz/week    21 Cans of beer per week     Comment: Daily 1-2 beers  . Drug Use: No  . Sexually Active: Not on file   Other Topics Concern  . Not on file   Social History Narrative  . No narrative on file      Physical Exam: BP 144/102  Pulse 77  Temp(Src) 98.2 F (36.8 C) (Oral)  Resp 14  Ht 6' (1.829 m)  Wt 196 lb (88.905 kg)  BMI 26.58 kg/m2  SpO2 95% Constitutional: generally well-appearing Psychiatric: alert and oriented x3 Abdomen: soft, nontender, nondistended, no obvious ascites, no  peritoneal signs, normal bowel sounds     Assessment and plan: 62 y.o. male with abnormal soft tissue around SMA  For EUS today

## 2013-04-07 NOTE — Anesthesia Preprocedure Evaluation (Signed)
Anesthesia Evaluation  Patient identified by MRN, date of birth, ID band Patient awake    Reviewed: Allergy & Precautions, H&P , NPO status , Patient's Chart, lab work & pertinent test results  Airway Mallampati: II TM Distance: >3 FB Neck ROM: Full    Dental no notable dental hx.    Pulmonary Current Smoker,  80 pk yr hx breath sounds clear to auscultation  Pulmonary exam normal       Cardiovascular hypertension, Pt. on medications Rhythm:Regular Rate:Normal     Neuro/Psych negative neurological ROS  negative psych ROS   GI/Hepatic negative GI ROS, Neg liver ROS, GERD-  Medicated,  Endo/Other  negative endocrine ROS  Renal/GU negative Renal ROS  negative genitourinary   Musculoskeletal negative musculoskeletal ROS (+)   Abdominal   Peds negative pediatric ROS (+)  Hematology negative hematology ROS (+)   Anesthesia Other Findings   Reproductive/Obstetrics negative OB ROS                           Anesthesia Physical Anesthesia Plan  ASA: III  Anesthesia Plan: MAC   Post-op Pain Management:    Induction: Intravenous  Airway Management Planned: Nasal Cannula  Additional Equipment:   Intra-op Plan:   Post-operative Plan:   Informed Consent: I have reviewed the patients History and Physical, chart, labs and discussed the procedure including the risks, benefits and alternatives for the proposed anesthesia with the patient or authorized representative who has indicated his/her understanding and acceptance.   Dental advisory given  Plan Discussed with: CRNA and Surgeon  Anesthesia Plan Comments:         Anesthesia Quick Evaluation

## 2013-04-15 ENCOUNTER — Encounter: Payer: Self-pay | Admitting: Internal Medicine

## 2013-04-15 ENCOUNTER — Other Ambulatory Visit (INDEPENDENT_AMBULATORY_CARE_PROVIDER_SITE_OTHER): Payer: BC Managed Care – PPO

## 2013-04-15 ENCOUNTER — Ambulatory Visit (INDEPENDENT_AMBULATORY_CARE_PROVIDER_SITE_OTHER): Payer: BC Managed Care – PPO | Admitting: Internal Medicine

## 2013-04-15 VITALS — BP 138/90 | HR 80 | Ht 72.0 in | Wt 196.0 lb

## 2013-04-15 DIAGNOSIS — K861 Other chronic pancreatitis: Secondary | ICD-10-CM

## 2013-04-15 DIAGNOSIS — R933 Abnormal findings on diagnostic imaging of other parts of digestive tract: Secondary | ICD-10-CM

## 2013-04-15 MED ORDER — DICYCLOMINE HCL 10 MG PO CAPS: 10.0000 mg | ORAL_CAPSULE | Freq: Two times a day (BID) | ORAL | Status: DC

## 2013-04-15 MED ORDER — OMEPRAZOLE 20 MG PO CPDR: 20.0000 mg | DELAYED_RELEASE_CAPSULE | Freq: Every day | ORAL | Status: DC

## 2013-04-15 NOTE — Patient Instructions (Addendum)
Your physician has requested that you go to the basement for lab work before leaving today. We have sent  medications to your pharmacy for you to pick up at your convenience. CC:  Dr Rosalio Macadamia MD

## 2013-04-15 NOTE — Progress Notes (Signed)
Henry [name] 27-Dec-1950 MRN [id number]        History of Present Illness:  This is a 62 year old white male who had EUS by Dr. Christella Hartigan for suspected pancreatitis and abnormality on CT scan described as a soft tissue in casement of SMA. The EUS showed diffuse echo hypodensity of the pancreas suggestive of chronic pancreatitis.  He has a history of hereditary hemachromatosis and is a heavy alcohol drinker. He says that he cut back his alcohol intake to 2 beers a day. EUS showed diffusely abnormal pancreas without focal lesion, normal-appearing pancreatic duct and no evidence of inflammatory changes surrounding the pancreas. His abdominal pain has markedly improved. He has hepatomegaly and a normal splenic size. His chief complaint today  is having frequent bowel movements sometimes loose and urgent after each meal but not at night.. He denies any rectal bleeding. Screening colonoscopy in February 2014 showed several polyps one of them adenomatous.   Past Medical History  Diagnosis Date  . Hypertension   . GERD (gastroesophageal reflux disease)   . Hemochromatosis   . Hx of adenomatous colonic polyps   . Umbilical hernia   . Diverticulosis   . Lumbar spondylosis     ankle   Past Surgical History  Procedure Laterality Date  . Lumbar laminectomy  1983  . Knee arthrsoscopy  2003    left  . Tonsillectomy  1962  . Eus N/A 04/07/2013    Procedure: UPPER ENDOSCOPIC ULTRASOUND (EUS) LINEAR;  Surgeon: Rachael Fee, MD;  Location: [facility];  Service: Endoscopy;  Laterality: N/A;    reports that he has been smoking Cigarettes.  He has a 80 pack-year smoking history. He has never used smokeless tobacco. He reports that he drinks about 12.6 ounces of alcohol per week. He reports that he does not use illicit drugs. family history includes Colon cancer (age of onset: 67) in his maternal grandmother and Stomach cancer (age of onset: 26) in his paternal grandfather. No Known Allergies       Review of Systems: Denies heartburn. Stop taking Prilosec. Weight loss of several pounds  The remainder of the 10 point ROS is negative except as outlined in H&P   Physical Exam: General appearance  Well developed, in no distress. Eyes- non icteric. HEENT nontraumatic, normocephalic. Mouth no lesions, tongue papillated, no cheilosis. Neck supple without adenopathy, thyroid not enlarged, no carotid bruits, no JVD. Lungs Clear to auscultation bilaterally. Cor normal S1, normal S2, regular rhythm, no murmur,  quiet precordium. Abdomen: Soft abdomen. Nontender. Normoactive bowel sounds. No distention. Liver edge at costal margin Rectal: Not done Extremities no pedal edema. Skin no lesions. Neurological alert and oriented x 3. Psychological normal mood and affect.  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 Clovis Riley and Dr.Grandfortuna, has not needed phlebotomy recently  Chronic pancreatitis by EUS. Likely alcohol related. Abdominal pain much improved. We will recheck his serum lipase today. I have counseled him about the abstaining from alcohol. Continue to take Prilosec 20 mg when necessary.  History of adenomatous polyps of the colon. Recall colonoscopy January 2019  Change in bowel habits consistent with irritable bowel syndrome. Start Bentyl 20 mg twice a day. I suggested probiotics when necessary, high fiber diet   04/15/2013 Henry [name]

## 2013-04-19 ENCOUNTER — Other Ambulatory Visit: Payer: Self-pay | Admitting: *Deleted

## 2013-04-19 DIAGNOSIS — K859 Acute pancreatitis without necrosis or infection, unspecified: Secondary | ICD-10-CM

## 2013-05-03 ENCOUNTER — Ambulatory Visit: Payer: BC Managed Care – PPO | Admitting: Internal Medicine

## 2013-06-03 ENCOUNTER — Encounter: Payer: Self-pay | Admitting: Internal Medicine

## 2013-06-16 ENCOUNTER — Telehealth: Payer: Self-pay

## 2013-06-16 DIAGNOSIS — R197 Diarrhea, unspecified: Secondary | ICD-10-CM

## 2013-06-16 NOTE — Telephone Encounter (Signed)
Orders in epic. 

## 2013-06-16 NOTE — Telephone Encounter (Signed)
Message copied by Chrystie Nose on Thu Jun 16, 2013 11:17 AM ------      Message from: Daphine Deutscher      Created: Tue Apr 19, 2013  3:59 PM       Call and remind patient due for lipase on 06/20/13 DB. Lab in EPIC ------

## 2013-06-16 NOTE — Telephone Encounter (Signed)
Please order stool for o&p, C.Diff, pathogens,lactoferin

## 2013-06-16 NOTE — Telephone Encounter (Signed)
Called pt to remind him to come in for Lipase lab next week. Pt states he is still not feeling well. States he is still having the symptoms he had in April. Pt wonders if he may have a parasite. Reports he and the stray dog he took in are having the same symptoms. Pt would like to be tested for parasites. Please advise.

## 2013-06-29 ENCOUNTER — Other Ambulatory Visit (INDEPENDENT_AMBULATORY_CARE_PROVIDER_SITE_OTHER): Payer: BC Managed Care – PPO

## 2013-06-29 ENCOUNTER — Telehealth: Payer: Self-pay | Admitting: Internal Medicine

## 2013-06-29 DIAGNOSIS — K859 Acute pancreatitis without necrosis or infection, unspecified: Secondary | ICD-10-CM

## 2013-06-29 NOTE — Telephone Encounter (Signed)
Spoke with patient and he picked up his stool study containers today. He states he has been on Amoxicillin since he called on 06/16/13. He wanted to be sure this would not interfere with his studies. Told patient this would not.

## 2013-06-30 LAB — LIPASE: Lipase: 75 U/L — ABNORMAL HIGH (ref 11.0–59.0)

## 2013-07-05 ENCOUNTER — Other Ambulatory Visit: Payer: BC Managed Care – PPO

## 2013-07-05 DIAGNOSIS — R197 Diarrhea, unspecified: Secondary | ICD-10-CM

## 2013-07-06 LAB — GASTROINTESTINAL PATHOGEN PANEL PCR
C. difficile Tox A/B, PCR: NEGATIVE
Cryptosporidium, PCR: NEGATIVE
E coli (ETEC) LT/ST PCR: NEGATIVE
E coli 0157, PCR: NEGATIVE
Giardia lamblia, PCR: NEGATIVE

## 2013-07-06 LAB — FECAL LACTOFERRIN, QUANT: Lactoferrin: NEGATIVE

## 2013-07-06 LAB — OVA AND PARASITE SCREEN

## 2013-11-23 ENCOUNTER — Other Ambulatory Visit: Payer: Self-pay | Admitting: Family Medicine

## 2013-11-24 ENCOUNTER — Other Ambulatory Visit: Payer: Self-pay | Admitting: Family Medicine

## 2013-11-24 ENCOUNTER — Ambulatory Visit
Admission: RE | Admit: 2013-11-24 | Discharge: 2013-11-24 | Disposition: A | Discharge status: Home / Self Care | Payer: BC Managed Care – PPO | Source: Ambulatory Visit | Attending: Family Medicine | Admitting: Family Medicine

## 2013-11-24 DIAGNOSIS — R634 Abnormal weight loss: Secondary | ICD-10-CM

## 2013-12-16 DIAGNOSIS — J90 Pleural effusion, not elsewhere classified: Secondary | ICD-10-CM | POA: Insufficient documentation

## 2013-12-16 HISTORY — DX: Pleural effusion, not elsewhere classified: J90

## 2014-06-15 ENCOUNTER — Encounter: Payer: Self-pay | Admitting: *Deleted

## 2014-06-15 ENCOUNTER — Ambulatory Visit
Admission: RE | Admit: 2014-06-15 | Discharge: 2014-06-15 | Disposition: A | Discharge status: Home / Self Care | Payer: BC Managed Care – PPO | Source: Ambulatory Visit | Attending: Family Medicine | Admitting: Family Medicine

## 2014-06-15 ENCOUNTER — Other Ambulatory Visit: Payer: Self-pay | Admitting: Family Medicine

## 2014-06-15 DIAGNOSIS — R079 Chest pain, unspecified: Secondary | ICD-10-CM

## 2014-06-15 DIAGNOSIS — R059 Cough, unspecified: Secondary | ICD-10-CM

## 2014-06-15 DIAGNOSIS — R05 Cough: Secondary | ICD-10-CM

## 2014-06-15 DIAGNOSIS — J90 Pleural effusion, not elsewhere classified: Secondary | ICD-10-CM

## 2014-06-16 ENCOUNTER — Other Ambulatory Visit: Payer: Self-pay | Admitting: *Deleted

## 2014-06-16 ENCOUNTER — Other Ambulatory Visit: Payer: Self-pay | Admitting: Thoracic Surgery (Cardiothoracic Vascular Surgery)

## 2014-06-16 ENCOUNTER — Ambulatory Visit
Admission: RE | Admit: 2014-06-16 | Discharge: 2014-06-16 | Disposition: A | Discharge status: Home / Self Care | Payer: BC Managed Care – PPO | Source: Ambulatory Visit | Attending: Thoracic Surgery (Cardiothoracic Vascular Surgery) | Admitting: Thoracic Surgery (Cardiothoracic Vascular Surgery)

## 2014-06-16 ENCOUNTER — Encounter: Payer: Self-pay | Admitting: Thoracic Surgery (Cardiothoracic Vascular Surgery)

## 2014-06-16 ENCOUNTER — Institutional Professional Consult (permissible substitution) (INDEPENDENT_AMBULATORY_CARE_PROVIDER_SITE_OTHER): Payer: BC Managed Care – PPO | Admitting: Thoracic Surgery (Cardiothoracic Vascular Surgery)

## 2014-06-16 VITALS — BP 151/96 | HR 76 | Resp 16 | Ht 72.0 in | Wt 150.0 lb

## 2014-06-16 DIAGNOSIS — J90 Pleural effusion, not elsewhere classified: Secondary | ICD-10-CM

## 2014-06-16 MED ORDER — CIPROFLOXACIN HCL 500 MG PO TABS: 500.0000 mg | ORAL_TABLET | Freq: Two times a day (BID) | ORAL | Status: DC

## 2014-06-16 NOTE — Progress Notes (Signed)
PCP is Donnie Coffin, MD Referring Provider is Gaynelle Arabian, MD  Chief Complaint  Patient presents with  . Pleural Effusion    large right...Marland KitchenMarland KitchenCXR    HPI: 63 year-old gentleman sent for consultation regarding right pleural effusion.  Mr. Henry [name] is a 63 year old gentleman with a history of hemochromatosis, hypertension, and tobacco abuse 80 pack years). He was in his usual state of health until about 2-3 weeks ago when he noticed that he was having some shortness of breath initially with exertion. He also was having wheezing and coughing more frequently. This progressed in at the point where he would be short of breath he tried to lie on his right side. 3 or 4 days ago he had some right shoulder pain. Then over the last 24 hours he developed severe pleuritic right-sided chest pain with coughing and deep breathing.  He saw Dr. Marisue Humble he did a chest x-ray. It revealed a large right pleural effusion.  He said that he has lost 60 pounds over the past 2 years due to issues related to irritable bowel syndrome. He has lost about 15 pounds over the past 6 months and 8 pounds over the past 3 months.he currently is smoking about 1 1/2 packs of cigarettes a day. He denies fevers, chills, and night sweats.   Past Medical History  Diagnosis Date  . Hypertension   . GERD (gastroesophageal reflux disease)   . Hemochromatosis   . Hx of adenomatous colonic polyps   . Umbilical hernia   . Diverticulosis   . Lumbar spondylosis     ankle  . Pleural effusion on right 12/16/13    per chest xray    Past Surgical History  Procedure Laterality Date  . Lumbar laminectomy  1983  . Knee arthrsoscopy  2003    left  . Tonsillectomy  1962  . Eus N/A 04/07/2013    Procedure: UPPER ENDOSCOPIC ULTRASOUND (EUS) LINEAR;  Surgeon: [name], MD;  Location: [facility];  Service: Endoscopy;  Laterality: N/A;    Family History  Problem Relation Age of Onset  . Colon cancer Maternal Grandmother 81  .  Stomach cancer Paternal Grandfather 56  . Hypertension Mother   . Hypertension Father   . Hypertension Sister     Social History History  Substance Use Topics  . Smoking status: Current Every Day Smoker -- 2.00 packs/day for 40 years    Types: Cigarettes  . Smokeless tobacco: Never Used  . Alcohol Use: 12.6 oz/week    21 Cans of beer per week     Comment: Daily 1-2 beers    Current Outpatient Prescriptions  Medication Sig Dispense Refill  . albuterol (PROVENTIL HFA;VENTOLIN HFA) 108 (90 BASE) MCG/ACT inhaler Inhale 2 puffs into the lungs every 4 (four) hours as needed for wheezing or shortness of breath.      . dicyclomine (BENTYL) 10 MG capsule Take 10 mg by mouth 4 (four) times daily as needed. For abdominal pain      . diltiazem (CARDIZEM CD) 360 MG 24 hr capsule Take 360 mg by mouth every morning.       . etodolac (LODINE) 400 MG tablet Take 400 mg by mouth 2 (two) times daily.      . fluticasone (FLONASE) 50 MCG/ACT nasal spray Place into both nostrils daily.      . hydrochlorothiazide (HYDRODIURIL) 25 MG tablet Take 25 mg by mouth daily. 1/2 tablet per day      . nebivolol (BYSTOLIC) 5 MG tablet Take  5 mg by mouth daily.      . NON FORMULARY Apply 1 application topically 3 (three) times daily. fdgl 10%-10%-5% Flurbiprofen, diclofenac gabapentin Apply to ankle      . omeprazole (PRILOSEC) 20 MG capsule Take 1 capsule (20 mg total) by mouth daily.  90 capsule  3  . Probiotic Product (ALIGN) 4 MG CAPS Take 1 capsule by mouth daily as needed.      . sildenafil (VIAGRA) 100 MG tablet Take 100 mg by mouth daily as needed for erectile dysfunction.      . triamcinolone cream (KENALOG) 0.1 % Apply 1 application topically 2 (two) times daily as needed.      . ciprofloxacin (CIPRO) 500 MG tablet Take 1 tablet (500 mg total) by mouth 2 (two) times daily.  14 tablet  0  . potassium chloride SA (K-DUR,KLOR-CON) 20 MEQ tablet Take 20 mEq by mouth daily.       . VOLTAREN 1 % GEL        No  current facility-administered medications for this visit.    No Known Allergies  Review of Systems  Constitutional: Positive for appetite change and unexpected weight change (lost 60 pounds over 2 years, 15 over past 6 months). Negative for fever and chills.  Respiratory: Positive for cough (clear mucous), shortness of breath and wheezing.        Pleuritic right chest wall pain  Gastrointestinal:       Hiatal hernia  Musculoskeletal: Positive for arthralgias and joint swelling.  All other systems reviewed and are negative.   BP 151/96  Pulse 76  Resp 16  Ht 6' (1.829 m)  Wt 150 lb (68.04 kg)  BMI 20.34 kg/m2  SpO2 97% Physical Exam  Vitals reviewed. Constitutional: He is oriented to person, place, and time.  Thin  HENT:  Head: Normocephalic and atraumatic.  Eyes: EOM are normal. Pupils are equal, round, and reactive to light.  Neck: Neck supple. No thyromegaly present.  Cardiovascular: Normal rate, regular rhythm and normal heart sounds.   No murmur heard. Pulmonary/Chest: He has wheezes.  Absent BS right lower 2/3 of chest  Abdominal: Soft. There is no tenderness.  Musculoskeletal: He exhibits no edema.  Lymphadenopathy:    He has no cervical adenopathy.  Neurological: He is alert and oriented to person, place, and time. No cranial nerve deficit.  Skin: Skin is warm and dry.     Diagnostic Tests: Chest x-ray 06/15/2014 FINDINGS:  Examination demonstrates opacification over the lower half of the  right hemithorax likely a large effusion with associated  atelectasis. Left lung is clear. Cardiac silhouette is within  normal. There is mild calcified plaque over the thoracic aorta.  Remainder of the exam is unchanged.  IMPRESSION:  New large right pleural effusion likely with associated atelectasis  occupying approximately 1/2 the right hemithorax.  Electronically Signed  By: Marin Olp M.D.  On: 06/15/2014 13:13    Impression:  63 year old gentleman with a  new right pleural effusion. The differential is very broad and includes infectious and inflammatory etiologies as well as the possibility of being cancerous. The time course would suggest the possibility of an infectious etiology, although the absence of fever would be unusual. He does smoke so he is at risk for lung cancer, however he had a chest x-ray in January which showed no suspicious findings. Of course, that does not completely rule out the possibility, but it would be unusually rapid progression.  The first step is to do  a thoracentesis so that we can examine the fluid. I discussed the indications, risks, benefits, and alternatives with Mr. Rumsey. He understands the risks of bleeding and pneumothorax. He does understand that if this is a infectious or inflammatory effusion we may not be able to drain  much of the fluid.  Plan: Thoracentesis - We'll send fluid for cytology, cell count with differential, cultures, amylase, protein, glucose, and LDH  Repeat PA and lateral chest x-ray postthoracentesis  Will followup in the office with repeat PA and lateral chest x-ray next week.  Addendum  Thoracentesis was performed in the office using sterile technique and 1% lidocaine local anesthetic. 2 L of sterile saline was fluid was evacuated. This did cause coughing and some discomfort, but overall he tolerated it well.  Followup chest x-ray postthoracentesis shows incomplete reexpansion of the lower and middle lobes with a residual space at the right base. This in all likelihood is a trapped lung.   Since he is doing well symptomatically I will plan to see him back in the office on Monday with a PA and lateral chest x-ray. It will determine whether to any further reexpansion of the lower and middle lobes or whether he is going to be surgical decortication. He knows to call if he has any difficulties with chest pain or shortness of breath over the weekend.

## 2014-06-17 DIAGNOSIS — C3490 Malignant neoplasm of unspecified part of unspecified bronchus or lung: Secondary | ICD-10-CM

## 2014-06-17 HISTORY — DX: Malignant neoplasm of unspecified part of unspecified bronchus or lung: C34.90

## 2014-06-17 LAB — BODY FLUID CELL COUNT WITH DIFFERENTIAL
Eos, Fluid: 10 % — ABNORMAL HIGH
LYMPHS FL: 53 %
MONOCYTE-MACROPHAGE-SEROUS FLUID: 1 %
NEUTROPHIL FLUID: 33 % — AB (ref 0–25)
Total Nucleated Cell Count, Fluid: 870 cu mm (ref 0–1000)

## 2014-06-19 ENCOUNTER — Other Ambulatory Visit: Payer: Self-pay | Admitting: Thoracic Surgery (Cardiothoracic Vascular Surgery)

## 2014-06-19 ENCOUNTER — Other Ambulatory Visit: Payer: Self-pay | Admitting: *Deleted

## 2014-06-19 ENCOUNTER — Ambulatory Visit
Admission: RE | Admit: 2014-06-19 | Discharge: 2014-06-19 | Disposition: A | Discharge status: Home / Self Care | Payer: BC Managed Care – PPO | Source: Ambulatory Visit | Attending: Thoracic Surgery (Cardiothoracic Vascular Surgery) | Admitting: Thoracic Surgery (Cardiothoracic Vascular Surgery)

## 2014-06-19 ENCOUNTER — Ambulatory Visit (INDEPENDENT_AMBULATORY_CARE_PROVIDER_SITE_OTHER): Payer: BC Managed Care – PPO | Admitting: Thoracic Surgery (Cardiothoracic Vascular Surgery)

## 2014-06-19 ENCOUNTER — Ambulatory Visit: Payer: BC Managed Care – PPO

## 2014-06-19 ENCOUNTER — Encounter (HOSPITAL_COMMUNITY): Payer: Self-pay | Admitting: Pharmacy Technician

## 2014-06-19 ENCOUNTER — Other Ambulatory Visit: Payer: Self-pay

## 2014-06-19 ENCOUNTER — Encounter: Payer: Self-pay | Admitting: Thoracic Surgery (Cardiothoracic Vascular Surgery)

## 2014-06-19 VITALS — BP 138/90 | HR 75 | Ht 72.0 in | Wt 150.0 lb

## 2014-06-19 DIAGNOSIS — J90 Pleural effusion, not elsewhere classified: Secondary | ICD-10-CM

## 2014-06-19 DIAGNOSIS — J9819 Other pulmonary collapse: Secondary | ICD-10-CM

## 2014-06-19 DIAGNOSIS — J948 Other specified pleural conditions: Secondary | ICD-10-CM

## 2014-06-19 NOTE — Progress Notes (Signed)
HPI:  Henry [name] returns with a follow up chest xray following thoracentesis last Friday.  He is a 63 yo man with an 80 pack year history of smoking and hemochromatosis.He was in his usual state of health until about 2-3 weeks ago when he noticed that he was having some shortness of breath initially with exertion. He also was having wheezing and coughing more frequently. This progressed in at the point where he would be short of breath he tried to lie on his right side. Last week he had some right shoulder pain. Then last Thursday he developed severe pleuritic right-sided chest pain with coughing and deep breathing. Dr. Marisue Humble did a CXR which showed a large right effusion.  I saw him on Friday and did a thoracentesis draining 2 L of serosanguinous fluid. His post drainage CXR showed a basilar space c/w a trapped lung. He says that his breathing improved after thoracentesis but did not completely get back to baseline. He still has a cough.  Past Medical History  Diagnosis Date  . Hypertension   . GERD (gastroesophageal reflux disease)   . Hemochromatosis   . Hx of adenomatous colonic polyps   . Umbilical hernia   . Diverticulosis   . Lumbar spondylosis     ankle  . Pleural effusion on right 12/16/13    per chest xray       Current Outpatient Prescriptions  Medication Sig Dispense Refill  . albuterol (PROVENTIL HFA;VENTOLIN HFA) 108 (90 BASE) MCG/ACT inhaler Inhale 2 puffs into the lungs every 4 (four) hours as needed for wheezing or shortness of breath.      . ciprofloxacin (CIPRO) 500 MG tablet Take 1 tablet (500 mg total) by mouth 2 (two) times daily.  14 tablet  0  . dicyclomine (BENTYL) 10 MG capsule Take 10 mg by mouth 4 (four) times daily as needed. For abdominal pain      . diltiazem (CARDIZEM CD) 360 MG 24 hr capsule Take 360 mg by mouth every morning.       . etodolac (LODINE) 400 MG tablet Take 400 mg by mouth 2 (two) times daily.      . fluticasone (FLONASE) 50 MCG/ACT  nasal spray Place into both nostrils daily.      . hydrochlorothiazide (HYDRODIURIL) 25 MG tablet Take 25 mg by mouth daily. 1/2 tablet per day      . nebivolol (BYSTOLIC) 5 MG tablet Take 5 mg by mouth daily.      . NON FORMULARY Apply 1 application topically 3 (three) times daily. fdgl 10%-10%-5% Flurbiprofen, diclofenac gabapentin Apply to ankle      . omeprazole (PRILOSEC) 20 MG capsule Take 1 capsule (20 mg total) by mouth daily.  90 capsule  3  . potassium chloride SA (K-DUR,KLOR-CON) 20 MEQ tablet Take 20 mEq by mouth daily.       . Probiotic Product (ALIGN) 4 MG CAPS Take 1 capsule by mouth daily as needed.      . sildenafil (VIAGRA) 100 MG tablet Take 100 mg by mouth daily as needed for erectile dysfunction.      . triamcinolone cream (KENALOG) 0.1 % Apply 1 application topically 2 (two) times daily as needed.      . VOLTAREN 1 % GEL        No current facility-administered medications for this visit.    Physical Exam BP 138/90  Pulse 75  Ht 6' (1.829 m)  Wt 150 lb (68.04 kg)  BMI 20.34 kg/m2  SpO2 96%  63 yo man in NAD Alert and oriented x 3, no focal neuro deficits HEENT unremarkable Neck- no adenopathy Cardiac RRR no murmur Lungs- absent BS lower 1/2 of right chest Abdomen- soft NT No peripheral edema  Diagnostic Tests: CHEST 2 VIEW  COMPARISON: Radiographs 11/24/2013, 06/15/2014 and 06/16/2014.  FINDINGS:  There is a persistent right-sided hydro pneumothorax status post  recent right-sided thoracentesis. Compared with the study performed  3 days ago, there is slightly increased air and fluid within the  right pleural space. Underlying right basilar pulmonary opacity and  partial right lung collapse are stable. There is no mediastinal  shift. The left lung is clear. The visualized heart size and  mediastinal contours are stable.  IMPRESSION:  Slight enlargement of right-sided hydro pneumothorax following  recent thoracentesis. No associated mediastinal shift.   Electronically Signed  By: Camie Patience M.D.  On: 06/19/2014 14:32   Impression: 63 yo man with a new right pleural effusion. It was serosanguinous when drained. It is most likely a para-pneumonic effusion but a malignant etiology cannot be ruled out. In any event he has a trapped lung that did not re-expand with thoracentesis. He needs a surgical decortication to definitively deal with the effusion and re-expand the lung to prevent permanent loss of function in that area.  I do think he should have a CT of the chest prior to surgery to r/o an obstructing mass  I discussed the general nature of the procedure, the need for general anesthesia, the incisions to be used, and the need for chest tubes postop with Henry [name]. We discussed the expected hospital stay, overall recovery and short and long term outcomes. We reviewed the indications, risks, benefits and alternatives. He understands the risks include but are not limited to death, stroke, MI, DVT/PE, bleeding, possible need for transfusion, infections, prolonged air leaks, cardiac arrhythmias, as well as the possibility of unforeseeable complications.  He accepts the risks and agrees to proceed.  Plan:  Right VATS, drainage of effusion and decortication on Thursday 8/6

## 2014-06-20 ENCOUNTER — Encounter (HOSPITAL_COMMUNITY)
Admission: RE | Admit: 2014-06-20 | Discharge: 2014-06-20 | Disposition: A | Discharge status: Home / Self Care | Payer: BC Managed Care – PPO | Source: Ambulatory Visit | Attending: Thoracic Surgery (Cardiothoracic Vascular Surgery) | Admitting: Thoracic Surgery (Cardiothoracic Vascular Surgery)

## 2014-06-20 ENCOUNTER — Telehealth: Payer: Self-pay | Admitting: *Deleted

## 2014-06-20 ENCOUNTER — Ambulatory Visit: Payer: BC Managed Care – PPO | Admitting: Thoracic Surgery (Cardiothoracic Vascular Surgery)

## 2014-06-20 ENCOUNTER — Other Ambulatory Visit: Payer: Self-pay

## 2014-06-20 ENCOUNTER — Encounter (HOSPITAL_COMMUNITY): Payer: Self-pay | Admitting: *Deleted

## 2014-06-20 VITALS — BP 126/87 | HR 74 | Temp 99.1°F | Resp 18 | Ht 72.0 in | Wt 146.6 lb

## 2014-06-20 DIAGNOSIS — J90 Pleural effusion, not elsewhere classified: Secondary | ICD-10-CM

## 2014-06-20 LAB — COMPREHENSIVE METABOLIC PANEL
ALT: 14 U/L (ref 0–53)
AST: 18 U/L (ref 0–37)
Albumin: 3.4 g/dL — ABNORMAL LOW (ref 3.5–5.2)
Alkaline Phosphatase: 70 U/L (ref 39–117)
Anion gap: 16 — ABNORMAL HIGH (ref 5–15)
BUN: 10 mg/dL (ref 6–23)
CO2: 20 mEq/L (ref 19–32)
CREATININE: 0.57 mg/dL (ref 0.50–1.35)
Calcium: 9 mg/dL (ref 8.4–10.5)
Chloride: 102 mEq/L (ref 96–112)
GFR calc Af Amer: >90 mL/min (ref >90)
Glucose, Bld: 122 mg/dL — ABNORMAL HIGH (ref 70–99)
POTASSIUM: 3.6 meq/L — AB (ref 3.7–5.3)
Sodium: 138 mEq/L (ref 137–147)
TOTAL PROTEIN: 6.4 g/dL (ref 6.0–8.3)
Total Bilirubin: 0.9 mg/dL (ref 0.3–1.2)

## 2014-06-20 LAB — CBC
HCT: 48.7 % (ref 39.0–52.0)
Hemoglobin: 17.6 g/dL — ABNORMAL HIGH (ref 13.0–17.0)
MCH: 34 pg (ref 26.0–34.0)
MCHC: 36.1 g/dL — AB (ref 30.0–36.0)
MCV: 94.2 fL (ref 78.0–100.0)
Platelets: 247 10*3/uL (ref 150–400)
RBC: 5.17 MIL/uL (ref 4.22–5.81)
RDW: 13.7 % (ref 11.5–15.5)
WBC: 9.3 10*3/uL (ref 4.0–10.5)

## 2014-06-20 LAB — PROTIME-INR
INR: 1.13 (ref 0.00–1.49)
Prothrombin Time: 14.5 seconds (ref 11.6–15.2)

## 2014-06-20 LAB — SURGICAL PCR SCREEN
MRSA, PCR: NEGATIVE
STAPHYLOCOCCUS AUREUS: POSITIVE — AB

## 2014-06-20 LAB — BLOOD GAS, ARTERIAL
Acid-Base Excess: 2.5 mmol/L — ABNORMAL HIGH (ref 0.0–2.0)
Bicarbonate: 26.3 mEq/L — ABNORMAL HIGH (ref 20.0–24.0)
DRAWN BY: 20636
FIO2: 0.21 %
O2 Saturation: 94.8 %
PCO2 ART: 38.5 mmHg (ref 35.0–45.0)
PH ART: 7.449 (ref 7.350–7.450)
Patient temperature: 98.6
TCO2: 27.4 mmol/L (ref 0–100)
pO2, Arterial: 71.1 mmHg — ABNORMAL LOW (ref 80.0–100.0)

## 2014-06-20 LAB — URINALYSIS, ROUTINE W REFLEX MICROSCOPIC
Bilirubin Urine: NEGATIVE
Glucose, UA: NEGATIVE mg/dL
Hgb urine dipstick: NEGATIVE
Ketones, ur: NEGATIVE mg/dL
LEUKOCYTES UA: NEGATIVE
NITRITE: NEGATIVE
PH: 6 (ref 5.0–8.0)
Protein, ur: NEGATIVE mg/dL
SPECIFIC GRAVITY, URINE: 1.009 (ref 1.005–1.030)
Urobilinogen, UA: 1 mg/dL (ref 0.0–1.0)

## 2014-06-20 LAB — BODY FLUID CULTURE
Gram Stain: NONE SEEN
Organism ID, Bacteria: NO GROWTH

## 2014-06-20 LAB — CYTOLOGY, FLUID (SOLSTAS)

## 2014-06-20 LAB — AMYLASE, PLEURAL FLUID: AMYLASE, PLEURAL FLUID: 20 U/L

## 2014-06-20 LAB — PROTEIN, PLEURAL FLUID: PROTEIN, PLEURAL FLUID: 4.2 g/dL

## 2014-06-20 LAB — LACTATE DEHYDROGENASE, PLEURAL FLUID: LD, Pleural fluid: 235 U/L

## 2014-06-20 LAB — ABO/RH: ABO/RH(D): B POS

## 2014-06-20 LAB — GLUCOSE, PLEURAL OR PERITONEAL FLUID: Glucose, Pleural fluid: 98 mg/dL

## 2014-06-20 LAB — APTT: aPTT: 32 seconds (ref 24–37)

## 2014-06-20 NOTE — Pre-Procedure Instructions (Signed)
Henry [name]  06/20/2014   Your procedure is scheduled on:  Thursday, August 6.  Report to [facility] Admitting at 11:30 AM.  Call this number if you have problems the morning of surgery: [phone]   Remember:   Do not eat food or drink liquids after midnight Wednesday,     Take these medicines the morning of surgery with A SIP OF WATER: diltiazem (CARDIZEM CD), nebivolol (BYSTOLIC).  May use Albuterol if needed, bring to the hospital with you.     Do not wear jewelry, make-up or nail polish.  Do not wear lotions, powders, or perfumes. You may wear deodorant.   Men may shave face and neck.  Do not bring valuables to the hospital.              [facility] is not responsible  for any belongings or valuables.               Contacts, dentures or bridgework may not be worn into surgery.  Leave suitcase in the car. After surgery it may be brought to your room.  For patients admitted to the hospital, discharge time is determined by your treatment team.                Special Instructions: Review  [facility] - Preparing For Surgery.   Please read over the following fact sheets that you were given: Pain Booklet, Coughing and Deep Breathing, Blood Transfusion Information and Surgical Site Infection Prevention

## 2014-06-20 NOTE — Progress Notes (Signed)
I received notes from oncology office,  With Dr Beryle Beams notes regarding washed PRBCs.  I faxesd these notes to the Blood Bank and spoke with [name] there , who confirmed that she received the note.

## 2014-06-20 NOTE — Progress Notes (Signed)
I called a prescription  For Mupirocin ointment to [facility] , [address] , [place].

## 2014-06-20 NOTE — Telephone Encounter (Signed)
Jan with [facility] called reporting this former patient of Dr. Beryle Beams with Hemochromatosis.  Mr. 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  Jan unable to find anything about this.  This nurse checked [facility] information.  Note from 12-22-2003 plan does reasd he will need washed red blood cells in the eventuality he requires transfusion.  Will send this to Pre-admission fax number [phone] for continuity of care.

## 2014-06-20 NOTE — Progress Notes (Addendum)
Henry [name] reported that when he was followed by Henry [name], that Henry [name] instructed him that if he ever needed a blood transfusion that he should have washed PRBCs.  I spook to blood bank and was told that we need a note from Henry [name].  I spoke with [facility]  triage nurse at oncology office.  Henry [name] found notes in the old computer system with information regarding the need for washed PRBCs for Henry [name] and is faxing this to me.I notified [name] in blood bank and was instructed to fax the information to [place] when I receive it.  Henry [name]  is lactose intolertant and eats a lactose free and gluten free diet and is concerned about having a flair up if he doesn't maintain this diet while he is here.  I obtained the patient menu we have and gave it to Henry [name]. Henry [name] said  "I guess the servers will know what is in the food".  I spoke with dietary and was instructed to have MD order Lactose free and Gluten free diet and that will  Inform kitchen.

## 2014-06-21 ENCOUNTER — Ambulatory Visit
Admission: RE | Admit: 2014-06-21 | Discharge: 2014-06-21 | Disposition: A | Discharge status: Home / Self Care | Payer: BC Managed Care – PPO | Source: Ambulatory Visit | Attending: Thoracic Surgery (Cardiothoracic Vascular Surgery) | Admitting: Thoracic Surgery (Cardiothoracic Vascular Surgery)

## 2014-06-21 DIAGNOSIS — J948 Other specified pleural conditions: Secondary | ICD-10-CM

## 2014-06-21 MED ORDER — DEXTROSE 5 % IV SOLN
1.5000 g | INTRAVENOUS | Status: AC
  Administered 2014-06-22: 1.5 g via INTRAVENOUS
  Filled 2014-06-21: qty 1.5

## 2014-06-22 ENCOUNTER — Inpatient Hospital Stay (HOSPITAL_COMMUNITY): Payer: BC Managed Care – PPO | Admitting: Anesthesiology

## 2014-06-22 ENCOUNTER — Encounter (HOSPITAL_COMMUNITY)
Admission: RE | Disposition: A | Discharge status: Home / Self Care | Payer: Self-pay | Source: Ambulatory Visit | Attending: Thoracic Surgery (Cardiothoracic Vascular Surgery)

## 2014-06-22 ENCOUNTER — Inpatient Hospital Stay (HOSPITAL_COMMUNITY): Payer: BC Managed Care – PPO

## 2014-06-22 ENCOUNTER — Encounter (HOSPITAL_COMMUNITY): Payer: BC Managed Care – PPO | Admitting: Anesthesiology

## 2014-06-22 ENCOUNTER — Inpatient Hospital Stay (HOSPITAL_COMMUNITY)
Admission: RE | Admit: 2014-06-22 | Discharge: 2014-06-28 | DRG: 163 | Disposition: A | Discharge status: Home / Self Care | Payer: BC Managed Care – PPO | Source: Ambulatory Visit | Attending: Thoracic Surgery (Cardiothoracic Vascular Surgery) | Admitting: Thoracic Surgery (Cardiothoracic Vascular Surgery)

## 2014-06-22 ENCOUNTER — Encounter (HOSPITAL_COMMUNITY): Payer: Self-pay | Admitting: Certified Registered Nurse Anesthetist

## 2014-06-22 DIAGNOSIS — R339 Retention of urine, unspecified: Secondary | ICD-10-CM | POA: Diagnosis not present

## 2014-06-22 DIAGNOSIS — J910 Malignant pleural effusion: Secondary | ICD-10-CM

## 2014-06-22 DIAGNOSIS — Z01810 Encounter for preprocedural cardiovascular examination: Secondary | ICD-10-CM

## 2014-06-22 DIAGNOSIS — Z79899 Other long term (current) drug therapy: Secondary | ICD-10-CM

## 2014-06-22 DIAGNOSIS — Y838 Other surgical procedures as the cause of abnormal reaction of the patient, or of later complication, without mention of misadventure at the time of the procedure: Secondary | ICD-10-CM | POA: Diagnosis not present

## 2014-06-22 DIAGNOSIS — E876 Hypokalemia: Secondary | ICD-10-CM | POA: Diagnosis not present

## 2014-06-22 DIAGNOSIS — IMO0002 Reserved for concepts with insufficient information to code with codable children: Secondary | ICD-10-CM

## 2014-06-22 DIAGNOSIS — I10 Essential (primary) hypertension: Secondary | ICD-10-CM | POA: Diagnosis present

## 2014-06-22 DIAGNOSIS — F17200 Nicotine dependence, unspecified, uncomplicated: Secondary | ICD-10-CM | POA: Diagnosis present

## 2014-06-22 DIAGNOSIS — K219 Gastro-esophageal reflux disease without esophagitis: Secondary | ICD-10-CM | POA: Diagnosis present

## 2014-06-22 DIAGNOSIS — C3490 Malignant neoplasm of unspecified part of unspecified bronchus or lung: Secondary | ICD-10-CM | POA: Diagnosis present

## 2014-06-22 DIAGNOSIS — J90 Pleural effusion, not elsewhere classified: Secondary | ICD-10-CM

## 2014-06-22 DIAGNOSIS — Z01812 Encounter for preprocedural laboratory examination: Secondary | ICD-10-CM

## 2014-06-22 DIAGNOSIS — E43 Unspecified severe protein-calorie malnutrition: Secondary | ICD-10-CM | POA: Insufficient documentation

## 2014-06-22 DIAGNOSIS — T8182XA Emphysema (subcutaneous) resulting from a procedure, initial encounter: Secondary | ICD-10-CM | POA: Diagnosis not present

## 2014-06-22 HISTORY — PX: VIDEO ASSISTED THORACOSCOPY (VATS)/DECORTICATION: SHX6171

## 2014-06-22 HISTORY — DX: Shortness of breath: R06.02

## 2014-06-22 HISTORY — PX: PLEURAL EFFUSION DRAINAGE: SHX5099

## 2014-06-22 SURGERY — VIDEO ASSISTED THORACOSCOPY (VATS)/DECORTICATION
Anesthesia: General | Site: Chest | Laterality: Right

## 2014-06-22 MED ORDER — NEOSTIGMINE METHYLSULFATE 10 MG/10ML IV SOLN
INTRAVENOUS | Status: DC | PRN
  Administered 2014-06-22: 4 mg via INTRAVENOUS

## 2014-06-22 MED ORDER — MIDAZOLAM HCL 5 MG/5ML IJ SOLN
INTRAMUSCULAR | Status: DC | PRN
  Administered 2014-06-22: 2 mg via INTRAVENOUS

## 2014-06-22 MED ORDER — GLYCOPYRROLATE 0.2 MG/ML IJ SOLN
INTRAMUSCULAR | Status: AC
  Filled 2014-06-22: qty 3

## 2014-06-22 MED ORDER — HYDROMORPHONE HCL PF 1 MG/ML IJ SOLN
0.2500 mg | INTRAMUSCULAR | Status: DC | PRN
  Administered 2014-06-22: 0.5 mg via INTRAVENOUS

## 2014-06-22 MED ORDER — FENTANYL 10 MCG/ML IV SOLN
INTRAVENOUS | Status: DC
  Administered 2014-06-22: 17:00:00 via INTRAVENOUS
  Administered 2014-06-22: 75 ug via INTRAVENOUS
  Administered 2014-06-23: 90 ug via INTRAVENOUS
  Administered 2014-06-23: 15:00:00 via INTRAVENOUS
  Administered 2014-06-23: 30 ug via INTRAVENOUS
  Administered 2014-06-23: 75 ug via INTRAVENOUS
  Administered 2014-06-23: 120 ug via INTRAVENOUS
  Administered 2014-06-23: 105 ug via INTRAVENOUS
  Administered 2014-06-23: 135 ug via INTRAVENOUS
  Administered 2014-06-24: 150 ug via INTRAVENOUS
  Administered 2014-06-24: 255 ug via INTRAVENOUS
  Administered 2014-06-24: 15:00:00 via INTRAVENOUS
  Administered 2014-06-24: 45 ug via INTRAVENOUS
  Administered 2014-06-24: 160.9 ug via INTRAVENOUS
  Administered 2014-06-24 (×2): 30 ug via INTRAVENOUS
  Administered 2014-06-25: 345 ug via INTRAVENOUS
  Administered 2014-06-25: 15 ug via INTRAVENOUS
  Administered 2014-06-25: 98.98 ug via INTRAVENOUS
  Administered 2014-06-25: 06:00:00 via INTRAVENOUS
  Administered 2014-06-25: 75 ug via INTRAVENOUS
  Administered 2014-06-25: 19:00:00 via INTRAVENOUS
  Administered 2014-06-25: 375 ug via INTRAVENOUS
  Administered 2014-06-25: 150 ug via INTRAVENOUS
  Administered 2014-06-26 (×2): via INTRAVENOUS
  Administered 2014-06-26: 172 ug via INTRAVENOUS
  Administered 2014-06-26: 192 ug via INTRAVENOUS
  Administered 2014-06-26: 75 ug via INTRAVENOUS
  Administered 2014-06-26: 150 ug via INTRAVENOUS
  Administered 2014-06-26: 105 ug via INTRAVENOUS
  Administered 2014-06-27: 120 ug via INTRAVENOUS
  Administered 2014-06-27: 94 ug via INTRAVENOUS
  Filled 2014-06-22 (×8): qty 50

## 2014-06-22 MED ORDER — FENTANYL CITRATE 0.05 MG/ML IJ SOLN
INTRAMUSCULAR | Status: AC
  Filled 2014-06-22: qty 5

## 2014-06-22 MED ORDER — LIDOCAINE HCL (CARDIAC) 20 MG/ML IV SOLN
INTRAVENOUS | Status: AC
  Filled 2014-06-22: qty 5

## 2014-06-22 MED ORDER — ALBUTEROL SULFATE (2.5 MG/3ML) 0.083% IN NEBU
2.5000 mg | INHALATION_SOLUTION | RESPIRATORY_TRACT | Status: DC
  Administered 2014-06-22 – 2014-06-23 (×3): 2.5 mg via RESPIRATORY_TRACT
  Filled 2014-06-22 (×3): qty 3

## 2014-06-22 MED ORDER — ACETAMINOPHEN 160 MG/5ML PO SOLN
1000.0000 mg | Freq: Four times a day (QID) | ORAL | Status: AC
  Administered 2014-06-23: 1000 mg via ORAL
  Filled 2014-06-22 (×20): qty 40

## 2014-06-22 MED ORDER — PROPOFOL 10 MG/ML IV BOLUS
INTRAVENOUS | Status: DC | PRN
  Administered 2014-06-22: 140 mg via INTRAVENOUS

## 2014-06-22 MED ORDER — CIPROFLOXACIN HCL 500 MG PO TABS
500.0000 mg | ORAL_TABLET | Freq: Two times a day (BID) | ORAL | Status: DC
  Administered 2014-06-22 – 2014-06-23 (×2): 500 mg via ORAL
  Filled 2014-06-22 (×4): qty 1

## 2014-06-22 MED ORDER — DEXAMETHASONE SODIUM PHOSPHATE 4 MG/ML IJ SOLN
INTRAMUSCULAR | Status: AC
  Filled 2014-06-22: qty 2

## 2014-06-22 MED ORDER — OXYCODONE HCL 5 MG PO TABS
5.0000 mg | ORAL_TABLET | ORAL | Status: DC | PRN
  Administered 2014-06-27: 10 mg via ORAL
  Filled 2014-06-22: qty 1
  Filled 2014-06-22: qty 2

## 2014-06-22 MED ORDER — ROCURONIUM BROMIDE 100 MG/10ML IV SOLN
INTRAVENOUS | Status: DC | PRN
  Administered 2014-06-22 (×2): 10 mg via INTRAVENOUS
  Administered 2014-06-22: 40 mg via INTRAVENOUS
  Administered 2014-06-22: 10 mg via INTRAVENOUS

## 2014-06-22 MED ORDER — DIPHENHYDRAMINE HCL 12.5 MG/5ML PO ELIX
12.5000 mg | ORAL_SOLUTION | Freq: Four times a day (QID) | ORAL | Status: DC | PRN
  Filled 2014-06-22: qty 5

## 2014-06-22 MED ORDER — DEXAMETHASONE SODIUM PHOSPHATE 4 MG/ML IJ SOLN
INTRAMUSCULAR | Status: DC | PRN
  Administered 2014-06-22: 8 mg via INTRAVENOUS

## 2014-06-22 MED ORDER — TALC 5 G PL SUSR
INTRAPLEURAL | Status: AC
  Filled 2014-06-22: qty 5

## 2014-06-22 MED ORDER — ARTIFICIAL TEARS OP OINT
TOPICAL_OINTMENT | OPHTHALMIC | Status: AC
  Filled 2014-06-22: qty 3.5

## 2014-06-22 MED ORDER — EPHEDRINE SULFATE 50 MG/ML IJ SOLN
INTRAMUSCULAR | Status: DC | PRN
  Administered 2014-06-22: 5 mg via INTRAVENOUS

## 2014-06-22 MED ORDER — BISACODYL 5 MG PO TBEC
10.0000 mg | DELAYED_RELEASE_TABLET | Freq: Every day | ORAL | Status: DC
  Administered 2014-06-22 – 2014-06-23 (×2): 10 mg via ORAL
  Filled 2014-06-22 (×6): qty 2

## 2014-06-22 MED ORDER — SENNOSIDES-DOCUSATE SODIUM 8.6-50 MG PO TABS
1.0000 | ORAL_TABLET | Freq: Every day | ORAL | Status: DC
  Administered 2014-06-22 – 2014-06-26 (×3): 1 via ORAL
  Filled 2014-06-22 (×7): qty 1

## 2014-06-22 MED ORDER — DIPHENHYDRAMINE HCL 50 MG/ML IJ SOLN
12.5000 mg | Freq: Four times a day (QID) | INTRAMUSCULAR | Status: DC | PRN
  Filled 2014-06-22: qty 1

## 2014-06-22 MED ORDER — SODIUM CHLORIDE 0.9 % IV SOLN
10.0000 mg | INTRAVENOUS | Status: DC | PRN
  Administered 2014-06-22: 20 ug/min via INTRAVENOUS

## 2014-06-22 MED ORDER — HEMOSTATIC AGENTS (NO CHARGE) OPTIME
TOPICAL | Status: DC | PRN
  Administered 2014-06-22: 1 via TOPICAL

## 2014-06-22 MED ORDER — MIDAZOLAM HCL 2 MG/2ML IJ SOLN
INTRAMUSCULAR | Status: AC
  Filled 2014-06-22: qty 2

## 2014-06-22 MED ORDER — GLYCOPYRROLATE 0.2 MG/ML IJ SOLN
INTRAMUSCULAR | Status: DC | PRN
  Administered 2014-06-22: 0.4 mg via INTRAVENOUS
  Administered 2014-06-22: 0.6 mg via INTRAVENOUS

## 2014-06-22 MED ORDER — ROCURONIUM BROMIDE 50 MG/5ML IV SOLN
INTRAVENOUS | Status: AC
  Filled 2014-06-22: qty 1

## 2014-06-22 MED ORDER — ONDANSETRON HCL 4 MG/2ML IJ SOLN: 4.0000 mg | Freq: Four times a day (QID) | INTRAMUSCULAR | Status: DC | PRN

## 2014-06-22 MED ORDER — HYDROMORPHONE HCL PF 1 MG/ML IJ SOLN
INTRAMUSCULAR | Status: AC
  Filled 2014-06-22: qty 1

## 2014-06-22 MED ORDER — DILTIAZEM HCL ER COATED BEADS 360 MG PO CP24
360.0000 mg | ORAL_CAPSULE | Freq: Every morning | ORAL | Status: DC
  Administered 2014-06-24 – 2014-06-28 (×5): 360 mg via ORAL
  Filled 2014-06-22 (×6): qty 1

## 2014-06-22 MED ORDER — DEXTROSE 5 % IV SOLN
1.5000 g | Freq: Two times a day (BID) | INTRAVENOUS | Status: AC
  Administered 2014-06-23 (×2): 1.5 g via INTRAVENOUS
  Filled 2014-06-22 (×2): qty 1.5

## 2014-06-22 MED ORDER — ONDANSETRON HCL 4 MG/2ML IJ SOLN
4.0000 mg | Freq: Four times a day (QID) | INTRAMUSCULAR | Status: DC | PRN
  Filled 2014-06-22: qty 2

## 2014-06-22 MED ORDER — PROPOFOL 10 MG/ML IV BOLUS
INTRAVENOUS | Status: AC
Start: 2014-06-22 — End: 2014-06-22
  Filled 2014-06-22: qty 20

## 2014-06-22 MED ORDER — SODIUM CHLORIDE 0.9 % IJ SOLN: 9.0000 mL | INTRAMUSCULAR | Status: DC | PRN

## 2014-06-22 MED ORDER — NEBIVOLOL HCL 5 MG PO TABS
5.0000 mg | ORAL_TABLET | Freq: Every day | ORAL | Status: DC
  Administered 2014-06-25 – 2014-06-28 (×4): 5 mg via ORAL
  Filled 2014-06-22 (×6): qty 1

## 2014-06-22 MED ORDER — TRAMADOL HCL 50 MG PO TABS: 50.0000 mg | ORAL_TABLET | Freq: Four times a day (QID) | ORAL | Status: DC | PRN

## 2014-06-22 MED ORDER — ACETAMINOPHEN 500 MG PO TABS
1000.0000 mg | ORAL_TABLET | Freq: Four times a day (QID) | ORAL | Status: AC
  Administered 2014-06-23 – 2014-06-27 (×17): 1000 mg via ORAL
  Filled 2014-06-22 (×22): qty 2

## 2014-06-22 MED ORDER — FENTANYL CITRATE 0.05 MG/ML IJ SOLN
INTRAMUSCULAR | Status: DC | PRN
  Administered 2014-06-22 (×5): 50 ug via INTRAVENOUS

## 2014-06-22 MED ORDER — 0.9 % SODIUM CHLORIDE (POUR BTL) OPTIME
TOPICAL | Status: DC | PRN
  Administered 2014-06-22: 1000 mL

## 2014-06-22 MED ORDER — NEOSTIGMINE METHYLSULFATE 10 MG/10ML IV SOLN
INTRAVENOUS | Status: AC
  Filled 2014-06-22: qty 1

## 2014-06-22 MED ORDER — LACTATED RINGERS IV SOLN
INTRAVENOUS | Status: DC | PRN
  Administered 2014-06-22: 13:00:00 via INTRAVENOUS

## 2014-06-22 MED ORDER — DEXTROSE-NACL 5-0.9 % IV SOLN
INTRAVENOUS | Status: DC
  Administered 2014-06-22 – 2014-06-23 (×2): via INTRAVENOUS

## 2014-06-22 MED ORDER — TALC 5 G PL SUSR
INTRAPLEURAL | Status: DC | PRN
  Administered 2014-06-22: 5 g via INTRAPLEURAL

## 2014-06-22 MED ORDER — ONDANSETRON HCL 4 MG/2ML IJ SOLN
INTRAMUSCULAR | Status: DC | PRN
  Administered 2014-06-22: 4 mg via INTRAVENOUS

## 2014-06-22 MED ORDER — KETOROLAC TROMETHAMINE 15 MG/ML IJ SOLN
15.0000 mg | Freq: Four times a day (QID) | INTRAMUSCULAR | Status: AC
  Administered 2014-06-22 – 2014-06-24 (×8): 15 mg via INTRAVENOUS
  Filled 2014-06-22 (×10): qty 1

## 2014-06-22 MED ORDER — LIDOCAINE HCL (CARDIAC) 20 MG/ML IV SOLN
INTRAVENOUS | Status: DC | PRN
  Administered 2014-06-22: 60 mg via INTRAVENOUS

## 2014-06-22 MED ORDER — FENTANYL CITRATE 0.05 MG/ML IJ SOLN
INTRAMUSCULAR | Status: AC
  Filled 2014-06-22: qty 2

## 2014-06-22 MED ORDER — ONDANSETRON HCL 4 MG/2ML IJ SOLN
INTRAMUSCULAR | Status: AC
  Filled 2014-06-22: qty 2

## 2014-06-22 MED ORDER — NALOXONE HCL 0.4 MG/ML IJ SOLN: 0.4000 mg | INTRAMUSCULAR | Status: DC | PRN

## 2014-06-22 MED ORDER — PHENYLEPHRINE HCL 10 MG/ML IJ SOLN: INTRAMUSCULAR | Status: DC | PRN

## 2014-06-22 MED ORDER — POTASSIUM CHLORIDE 10 MEQ/50ML IV SOLN: 10.0000 meq | Freq: Every day | INTRAVENOUS | Status: DC | PRN

## 2014-06-22 SURGICAL SUPPLY — 78 items
ADH SKN CLS APL DERMABOND .7 (GAUZE/BANDAGES/DRESSINGS) ×1
APL SRG 22X2 LUM MLBL SLNT (VASCULAR PRODUCTS) ×1
APPLICATOR TIP EXT COSEAL (VASCULAR PRODUCTS) ×2 IMPLANT
BAG SPEC RTRVL LRG 6X4 10 (ENDOMECHANICALS)
CANISTER SUCTION 2500CC (MISCELLANEOUS) ×3 IMPLANT
CATH KIT ON Q 5IN SLV (PAIN MANAGEMENT) IMPLANT
CATH ROBINSON RED A/P 18FR (CATHETERS) ×2 IMPLANT
CATH THORACIC 28FR (CATHETERS) ×2 IMPLANT
CATH THORACIC 28FR RT ANG (CATHETERS) IMPLANT
CATH THORACIC 36FR (CATHETERS) IMPLANT
CATH THORACIC 36FR RT ANG (CATHETERS) IMPLANT
CLIP TI MEDIUM 6 (CLIP) ×3 IMPLANT
CONN ST 1/4X3/8  BEN (MISCELLANEOUS)
CONN ST 1/4X3/8 BEN (MISCELLANEOUS) IMPLANT
CONN Y 3/8X3/8X3/8  BEN (MISCELLANEOUS)
CONN Y 3/8X3/8X3/8 BEN (MISCELLANEOUS) IMPLANT
CONNECTOR 5 IN 1 STRAIGHT STRL (MISCELLANEOUS) ×4 IMPLANT
CONT SPEC 4OZ CLIKSEAL STRL BL (MISCELLANEOUS) ×8 IMPLANT
COVER SURGICAL LIGHT HANDLE (MISCELLANEOUS) ×6 IMPLANT
DERMABOND ADVANCED (GAUZE/BANDAGES/DRESSINGS) ×2
DERMABOND ADVANCED .7 DNX12 (GAUZE/BANDAGES/DRESSINGS) IMPLANT
DRAIN CHANNEL 32F RND 10.7 FF (WOUND CARE) ×4 IMPLANT
DRAPE LAPAROSCOPIC ABDOMINAL (DRAPES) ×3 IMPLANT
DRAPE WARM FLUID 44X44 (DRAPE) ×6 IMPLANT
ELECT REM PT RETURN 9FT ADLT (ELECTROSURGICAL) ×3
ELECTRODE REM PT RTRN 9FT ADLT (ELECTROSURGICAL) ×1 IMPLANT
GAUZE SPONGE 4X4 12PLY STRL (GAUZE/BANDAGES/DRESSINGS) ×3 IMPLANT
GLOVE BIO SURGEON STRL SZ 6.5 (GLOVE) ×1 IMPLANT
GLOVE BIO SURGEONS STRL SZ 6.5 (GLOVE) ×1
GLOVE BIOGEL PI IND STRL 7.0 (GLOVE) IMPLANT
GLOVE BIOGEL PI INDICATOR 7.0 (GLOVE) ×4
GLOVE SKINSENSE NS SZ6.5 (GLOVE) ×4
GLOVE SKINSENSE STRL SZ6.5 (GLOVE) IMPLANT
GLOVE SURG SIGNA 7.5 PF LTX (GLOVE) ×6 IMPLANT
GOWN STRL REUS W/ TWL LRG LVL3 (GOWN DISPOSABLE) ×2 IMPLANT
GOWN STRL REUS W/ TWL XL LVL3 (GOWN DISPOSABLE) ×1 IMPLANT
GOWN STRL REUS W/TWL LRG LVL3 (GOWN DISPOSABLE) ×15
GOWN STRL REUS W/TWL XL LVL3 (GOWN DISPOSABLE) ×3
HANDLE STAPLE ENDO GIA SHORT (STAPLE)
HEMOSTAT SURGICEL 2X14 (HEMOSTASIS) IMPLANT
KIT BASIN OR (CUSTOM PROCEDURE TRAY) ×3 IMPLANT
KIT ROOM TURNOVER OR (KITS) ×3 IMPLANT
NS IRRIG 1000ML POUR BTL (IV SOLUTION) ×12 IMPLANT
PACK CHEST (CUSTOM PROCEDURE TRAY) ×3 IMPLANT
PAD ARMBOARD 7.5X6 YLW CONV (MISCELLANEOUS) ×6 IMPLANT
POUCH ENDO CATCH II 15MM (MISCELLANEOUS) IMPLANT
POUCH SPECIMEN RETRIEVAL 10MM (ENDOMECHANICALS) IMPLANT
SEALANT PROGEL (MISCELLANEOUS) ×2 IMPLANT
SEALANT SURG COSEAL 4ML (VASCULAR PRODUCTS) IMPLANT
SEALANT SURG COSEAL 8ML (VASCULAR PRODUCTS) IMPLANT
SOLUTION ANTI FOG 6CC (MISCELLANEOUS) ×6 IMPLANT
SPONGE GAUZE 4X4 12PLY STER LF (GAUZE/BANDAGES/DRESSINGS) ×2 IMPLANT
SPONGE INTESTINAL PEANUT (DISPOSABLE) ×10 IMPLANT
STAPLER ENDO GIA 12 SHRT THIN (STAPLE) IMPLANT
STAPLER ENDO GIA 12MM SHORT (STAPLE) IMPLANT
SUT PROLENE 4 0 RB 1 (SUTURE)
SUT PROLENE 4-0 RB1 .5 CRCL 36 (SUTURE) IMPLANT
SUT SILK  1 MH (SUTURE) ×6
SUT SILK 1 MH (SUTURE) ×2 IMPLANT
SUT SILK 2 0SH CR/8 30 (SUTURE) IMPLANT
SUT SILK 3 0SH CR/8 30 (SUTURE) IMPLANT
SUT VIC AB 1 CTX 36 (SUTURE) ×3
SUT VIC AB 1 CTX36XBRD ANBCTR (SUTURE) IMPLANT
SUT VIC AB 2-0 CTX 36 (SUTURE) ×2 IMPLANT
SUT VIC AB 3-0 MH 27 (SUTURE) IMPLANT
SUT VIC AB 3-0 X1 27 (SUTURE) ×2 IMPLANT
SUT VICRYL 2 TP 1 (SUTURE) IMPLANT
SYSTEM SAHARA CHEST DRAIN RE-I (WOUND CARE) ×3 IMPLANT
TAPE CLOTH 4X10 WHT NS (GAUZE/BANDAGES/DRESSINGS) ×3 IMPLANT
TAPE CLOTH SURG 4X10 WHT LF (GAUZE/BANDAGES/DRESSINGS) ×2 IMPLANT
TIP APPLICATOR SPRAY EXTEND 16 (VASCULAR PRODUCTS) IMPLANT
TOWEL OR 17X24 6PK STRL BLUE (TOWEL DISPOSABLE) ×6 IMPLANT
TOWEL OR 17X26 10 PK STRL BLUE (TOWEL DISPOSABLE) ×6 IMPLANT
TRAP SPECIMEN MUCOUS 40CC (MISCELLANEOUS) ×4 IMPLANT
TRAY FOLEY CATH 16FRSI W/METER (SET/KITS/TRAYS/PACK) ×3 IMPLANT
TROCAR XCEL BLADELESS 5X75MML (TROCAR) ×3 IMPLANT
TUNNELER SHEATH ON-Q 11GX8 DSP (PAIN MANAGEMENT) IMPLANT
WATER STERILE IRR 1000ML POUR (IV SOLUTION) ×6 IMPLANT

## 2014-06-22 NOTE — Transfer of Care (Signed)
Immediate Anesthesia Transfer of Care Note  Patient: Henry [name]  Procedure(s) Performed: Procedure(s): VIDEO ASSISTED THORACOSCOPY (VATS)/DECORTICATION (Right) DRAINAGE OF PLEURAL EFFUSION (Right)  Patient Location: PACU  Anesthesia Type:General  Level of Consciousness: awake, alert  and oriented  Airway & Oxygen Therapy: Patient Spontanous Breathing and Patient connected to nasal cannula oxygen  Post-op Assessment: Report given to PACU RN, Post -op Vital signs reviewed and stable and Patient moving all extremities X 4  Post vital signs: Reviewed and stable  Complications: No apparent anesthesia complications

## 2014-06-22 NOTE — Anesthesia Postprocedure Evaluation (Signed)
  Anesthesia Post-op Note  Patient: Henry [name]  Procedure(s) Performed: Procedure(s): VIDEO ASSISTED THORACOSCOPY (VATS)/DECORTICATION (Right) DRAINAGE OF PLEURAL EFFUSION (Right)  Patient Location: PACU  Anesthesia Type:General  Level of Consciousness: awake  Airway and Oxygen Therapy: Patient Spontanous Breathing  Post-op Pain: mild  Post-op Assessment: Post-op Vital signs reviewed  Post-op Vital Signs: Reviewed  Last Vitals:  Filed Vitals:   06/22/14 1715  BP:   Pulse: 51  Temp:   Resp: 16    Complications: No apparent anesthesia complications

## 2014-06-22 NOTE — Anesthesia Preprocedure Evaluation (Addendum)
Anesthesia Evaluation  Patient identified by MRN, date of birth, ID band Patient awake    Reviewed: Allergy & Precautions, H&P , NPO status , Patient's Chart, lab work & pertinent test results  Airway Mallampati: II      Dental   Pulmonary shortness of breath and with exertion, Current Smoker,          Cardiovascular hypertension,     Neuro/Psych    GI/Hepatic Neg liver ROS, GERD-  ,  Endo/Other    Renal/GU negative Renal ROS     Musculoskeletal   Abdominal   Peds  Hematology   Anesthesia Other Findings   Reproductive/Obstetrics                          Anesthesia Physical Anesthesia Plan  ASA: III  Anesthesia Plan: General   Post-op Pain Management:    Induction: Intravenous  Airway Management Planned: Double Lumen EBT  Additional Equipment: Arterial line and CVP  Intra-op Plan:   Post-operative Plan: Possible Post-op intubation/ventilation  Informed Consent: I have reviewed the patients History and Physical, chart, labs and discussed the procedure including the risks, benefits and alternatives for the proposed anesthesia with the patient or authorized representative who has indicated his/her understanding and acceptance.   Dental advisory given  Plan Discussed with: Anesthesiologist, CRNA and Surgeon  Anesthesia Plan Comments:        Anesthesia Quick Evaluation

## 2014-06-22 NOTE — H&P (View-Only) (Signed)
HPI:  Henry [name] returns with a follow up chest xray following thoracentesis last Friday.  He is a 63 yo man with an 80 pack year history of smoking and hemochromatosis.He was in his usual state of health until about 2-3 weeks ago when he noticed that he was having some shortness of breath initially with exertion. He also was having wheezing and coughing more frequently. This progressed in at the point where he would be short of breath he tried to lie on his right side. Last week he had some right shoulder pain. Then last Thursday he developed severe pleuritic right-sided chest pain with coughing and deep breathing. Dr. Marisue Humble did a CXR which showed a large right effusion.  I saw him on Friday and did a thoracentesis draining 2 L of serosanguinous fluid. His post drainage CXR showed a basilar space c/w a trapped lung. He says that his breathing improved after thoracentesis but did not completely get back to baseline. He still has a cough.  Past Medical History  Diagnosis Date  . Hypertension   . GERD (gastroesophageal reflux disease)   . Hemochromatosis   . Hx of adenomatous colonic polyps   . Umbilical hernia   . Diverticulosis   . Lumbar spondylosis     ankle  . Pleural effusion on right 12/16/13    per chest xray       Current Outpatient Prescriptions  Medication Sig Dispense Refill  . albuterol (PROVENTIL HFA;VENTOLIN HFA) 108 (90 BASE) MCG/ACT inhaler Inhale 2 puffs into the lungs every 4 (four) hours as needed for wheezing or shortness of breath.      . ciprofloxacin (CIPRO) 500 MG tablet Take 1 tablet (500 mg total) by mouth 2 (two) times daily.  14 tablet  0  . dicyclomine (BENTYL) 10 MG capsule Take 10 mg by mouth 4 (four) times daily as needed. For abdominal pain      . diltiazem (CARDIZEM CD) 360 MG 24 hr capsule Take 360 mg by mouth every morning.       . etodolac (LODINE) 400 MG tablet Take 400 mg by mouth 2 (two) times daily.      . fluticasone (FLONASE) 50 MCG/ACT  nasal spray Place into both nostrils daily.      . hydrochlorothiazide (HYDRODIURIL) 25 MG tablet Take 25 mg by mouth daily. 1/2 tablet per day      . nebivolol (BYSTOLIC) 5 MG tablet Take 5 mg by mouth daily.      . NON FORMULARY Apply 1 application topically 3 (three) times daily. fdgl 10%-10%-5% Flurbiprofen, diclofenac gabapentin Apply to ankle      . omeprazole (PRILOSEC) 20 MG capsule Take 1 capsule (20 mg total) by mouth daily.  90 capsule  3  . potassium chloride SA (K-DUR,KLOR-CON) 20 MEQ tablet Take 20 mEq by mouth daily.       . Probiotic Product (ALIGN) 4 MG CAPS Take 1 capsule by mouth daily as needed.      . sildenafil (VIAGRA) 100 MG tablet Take 100 mg by mouth daily as needed for erectile dysfunction.      . triamcinolone cream (KENALOG) 0.1 % Apply 1 application topically 2 (two) times daily as needed.      . VOLTAREN 1 % GEL        No current facility-administered medications for this visit.    Physical Exam BP 138/90  Pulse 75  Ht 6' (1.829 m)  Wt 150 lb (68.04 kg)  BMI 20.34 kg/m2  SpO2 96%  63 yo man in NAD Alert and oriented x 3, no focal neuro deficits HEENT unremarkable Neck- no adenopathy Cardiac RRR no murmur Lungs- absent BS lower 1/2 of right chest Abdomen- soft NT No peripheral edema  Diagnostic Tests: CHEST 2 VIEW  COMPARISON: Radiographs 11/24/2013, 06/15/2014 and 06/16/2014.  FINDINGS:  There is a persistent right-sided hydro pneumothorax status post  recent right-sided thoracentesis. Compared with the study performed  3 days ago, there is slightly increased air and fluid within the  right pleural space. Underlying right basilar pulmonary opacity and  partial right lung collapse are stable. There is no mediastinal  shift. The left lung is clear. The visualized heart size and  mediastinal contours are stable.  IMPRESSION:  Slight enlargement of right-sided hydro pneumothorax following  recent thoracentesis. No associated mediastinal shift.   Electronically Signed  By: Camie Patience M.D.  On: 06/19/2014 14:32   Impression: 63 yo man with a new right pleural effusion. It was serosanguinous when drained. It is most likely a para-pneumonic effusion but a malignant etiology cannot be ruled out. In any event he has a trapped lung that did not re-expand with thoracentesis. He needs a surgical decortication to definitively deal with the effusion and re-expand the lung to prevent permanent loss of function in that area.  I do think he should have a CT of the chest prior to surgery to r/o an obstructing mass  I discussed the general nature of the procedure, the need for general anesthesia, the incisions to be used, and the need for chest tubes postop with Mr. Bougie. We discussed the expected hospital stay, overall recovery and short and long term outcomes. We reviewed the indications, risks, benefits and alternatives. He understands the risks include but are not limited to death, stroke, MI, DVT/PE, bleeding, possible need for transfusion, infections, prolonged air leaks, cardiac arrhythmias, as well as the possibility of unforeseeable complications.  He accepts the risks and agrees to proceed.  Plan:  Right VATS, drainage of effusion and decortication on Thursday 8/6

## 2014-06-22 NOTE — Progress Notes (Signed)
Pt sister request we call for a notary for advance directives.  Chaplin services called and informed. Sister and pt informed that chaplin services have been called.

## 2014-06-22 NOTE — Interval H&P Note (Signed)
History and Physical Interval Note:  06/22/2014 1:09 PM  Henry [name]  has presented today for surgery, with the diagnosis of right pleural effusion with trapped lungs  The various methods of treatment have been discussed with the patient and family. After consideration of risks, benefits and other options for treatment, the patient has consented to  Procedure(s): VIDEO ASSISTED THORACOSCOPY (VATS)/DECORTICATION (Right) DRAINAGE OF PLEURAL EFFUSION (Right) as a surgical intervention .  The patient's history has been reviewed, patient examined, no change in status, stable for surgery.  I have reviewed the patient's chart and labs.  Questions were answered to the patient's satisfaction.     Nickolas Chalfin C

## 2014-06-22 NOTE — Progress Notes (Signed)
           Intra-op Images

## 2014-06-22 NOTE — Progress Notes (Signed)
Pt admitted to 3S11 from PACU.VSS. 3 chest tubes to right side with 20 cm of suction, minimal air leak. Pt on full dose of fentanyl PCA. Educated on [place]. Safety discussed, call bell within reach, will continue to monitor

## 2014-06-22 NOTE — Brief Op Note (Addendum)
      [address]       [place] [address]             [phone]     06/22/2014  4:33 PM  PATIENT:  Huel Coventry  63 y.o. male  PRE-OPERATIVE DIAGNOSIS:  right pleural effusion with trapped lungs  POST-OPERATIVE DIAGNOSIS:  right pleural effusion  PROCEDURE:  Procedure(s): RIGHT VIDEO ASSISTED THORACOSCOPY (VATS) DRAINAGE OF PLEURAL EFFUSION PLEURAL BIOPSY PARTIAL DECORTICATION TALC PLEURODESIS  FROZEN: PARIETAL PLEURA C/W ADENOCARCINOMA  SURGEON:  Surgeon(s): [name], MD  PHYSICIAN ASSISTANT: WAYNE GOLD PA-C  ANESTHESIA:   general  SPECIMEN:  Source of Specimen:  PLEURA/PLEURAL EFFUSION  DISPOSITION OF SPECIMEN:  Pathology  DRAINS: 3 Chest Tube(s) in the RIGHT HEMITHORAX   PATIENT CONDITION:  PACU - hemodynamically stable.  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

## 2014-06-23 ENCOUNTER — Inpatient Hospital Stay (HOSPITAL_COMMUNITY): Payer: BC Managed Care – PPO

## 2014-06-23 ENCOUNTER — Encounter (HOSPITAL_COMMUNITY): Payer: Self-pay | Admitting: Thoracic Surgery (Cardiothoracic Vascular Surgery)

## 2014-06-23 LAB — BASIC METABOLIC PANEL
ANION GAP: 13 (ref 5–15)
BUN: 9 mg/dL (ref 6–23)
CHLORIDE: 99 meq/L (ref 96–112)
CO2: 26 mEq/L (ref 19–32)
CREATININE: 0.68 mg/dL (ref 0.50–1.35)
Calcium: 8.3 mg/dL — ABNORMAL LOW (ref 8.4–10.5)
GFR calc Af Amer: >90 mL/min (ref >90)
Glucose, Bld: 201 mg/dL — ABNORMAL HIGH (ref 70–99)
POTASSIUM: 3.8 meq/L (ref 3.7–5.3)
Sodium: 138 mEq/L (ref 137–147)

## 2014-06-23 LAB — TYPE AND SCREEN
ABO/RH(D): B POS
Antibody Screen: NEGATIVE
Unit division: 0
Unit division: 0

## 2014-06-23 LAB — CBC
HEMATOCRIT: 48.9 % (ref 39.0–52.0)
HEMOGLOBIN: 17.3 g/dL — AB (ref 13.0–17.0)
MCH: 33.8 pg (ref 26.0–34.0)
MCHC: 35.4 g/dL (ref 30.0–36.0)
MCV: 95.5 fL (ref 78.0–100.0)
Platelets: 242 10*3/uL (ref 150–400)
RBC: 5.12 MIL/uL (ref 4.22–5.81)
RDW: 14.1 % (ref 11.5–15.5)
WBC: 14.6 10*3/uL — AB (ref 4.0–10.5)

## 2014-06-23 LAB — BLOOD GAS, ARTERIAL
Acid-Base Excess: 1.2 mmol/L (ref 0.0–2.0)
Bicarbonate: 25.6 mEq/L — ABNORMAL HIGH (ref 20.0–24.0)
DRAWN BY: 41308
O2 CONTENT: 2 L/min
O2 SAT: 97.8 %
PO2 ART: 113 mmHg — AB (ref 80.0–100.0)
Patient temperature: 98.6
TCO2: 26.9 mmol/L (ref 0–100)
pCO2 arterial: 42.6 mmHg (ref 35.0–45.0)
pH, Arterial: 7.396 (ref 7.350–7.450)

## 2014-06-23 MED ORDER — SODIUM CHLORIDE 0.9 % IV SOLN
INTRAVENOUS | Status: DC
  Administered 2014-06-23 – 2014-06-24 (×2): via INTRAVENOUS

## 2014-06-23 MED ORDER — ALBUTEROL SULFATE (2.5 MG/3ML) 0.083% IN NEBU: 2.5000 mg | INHALATION_SOLUTION | Freq: Four times a day (QID) | RESPIRATORY_TRACT | Status: DC | PRN

## 2014-06-23 NOTE — Progress Notes (Signed)
Chaplain followed up on pt request from yesterday for Advance Directive. (Pt was already in surgery yesterday when I received the request.) Pt states he had completed an AD in the past and it was in his chart. What I found in his chart was not an AD but a will. Pt says he had wanted the will notarized yesterday before surgery. I told him that [facility] notaries couldn't do that but could notarize an AD. Pt completed AD today. Chaplain found witnesses and secured notary. Gave original and two copies to pt and gave another copy to RN to place in pt's chart.

## 2014-06-23 NOTE — Progress Notes (Signed)
[facility] DAILY ICU PROGRESS NOTE                   [address]            [place] [address]          [phone]   1 Day Post-Op Procedure(s) (LRB): VIDEO ASSISTED THORACOSCOPY (VATS)/DECORTICATION (Right) DRAINAGE OF PLEURAL EFFUSION (Right)  Total Length of Stay:  LOS: 1 day   Subjective: Feels pretty well, not SOB, pain controlled with PCA  Objective: Vital signs in last 24 hours: Temp:  [96.7 F (35.9 C)-98.4 F (36.9 C)] 98 F (36.7 C) (08/07 0451) Pulse Rate:  [48-66] 59 (08/07 0451) Cardiac Rhythm:  [-] Normal sinus rhythm (08/06 1945) Resp:  [12-20] 18 (08/07 0451) BP: (99-129)/(45-84) 99/45 mmHg (08/07 0451) SpO2:  [97 %-100 %] 98 % (08/07 0451) Arterial Line BP: (105-115)/(58-66) 115/66 mmHg (08/06 1800) Weight:  [146 lb (66.225 kg)] 146 lb (66.225 kg) (08/06 1840)  Filed Weights   06/22/14 1243 06/22/14 1840  Weight: 146 lb (66.225 kg) 146 lb (66.225 kg)    Weight change:    Hemodynamic parameters for last 24 hours:    Intake/Output from previous day: 08/06 0701 - 08/07 0700 In: 925 [I.V.:925] Out: 1225 [Urine:525; Blood:100; Chest Tube:600]  Intake/Output this shift:    Current Meds: Scheduled Meds: . acetaminophen  1,000 mg Oral 4 times per day   Or  . acetaminophen (TYLENOL) oral liquid 160 mg/5 mL  1,000 mg Oral 4 times per day  . albuterol  2.5 mg Nebulization Q4H while awake  . bisacodyl  10 mg Oral Daily  . cefUROXime (ZINACEF)  IV  1.5 g Intravenous Q12H  . ciprofloxacin  500 mg Oral BID  . diltiazem  360 mg Oral q morning - 10a  . fentaNYL   Intravenous 6 times per day  . ketorolac  15 mg Intravenous 4 times per day  . nebivolol  5 mg Oral Daily  . senna-docusate  1 tablet Oral QHS   Continuous Infusions: . dextrose 5 % and 0.9% NaCl 100 mL/hr at 06/23/14 0635   PRN Meds:.diphenhydrAMINE, diphenhydrAMINE, naloxone, ondansetron (ZOFRAN) IV, oxyCODONE, potassium chloride, sodium chloride, traMADol  General  appearance: alert, cooperative and no distress Heart: regular rate and rhythm Lungs: coarse with crackles on right, left pretty clear Abdomen: benign Extremities: PAS in place Wound: some bloody drainage on dressing  Lab Results: CBC: Recent Labs  06/20/14 1524 06/23/14 0400  WBC 9.3 14.6*  HGB 17.6* 17.3*  HCT 48.7 48.9  PLT 247 242   BMET:  Recent Labs  06/20/14 1524 06/23/14 0400  NA 138 138  K 3.6* 3.8  CL 102 99  CO2 20 26  GLUCOSE 122* 201*  BUN 10 9  CREATININE 0.57 0.68  CALCIUM 9.0 8.3*    PT/INR:  Recent Labs  06/20/14 1524  LABPROT 14.5  INR 1.13   Radiology: Chest 2 View  06/22/2014   CLINICAL DATA:  Hydropneumothorax  EXAM: CHEST  2 VIEW  COMPARISON:  Chest CT June 21, 2014  FINDINGS: There is a persistent hydro pneumothorax on the right. The pneumothorax portion appears slightly smaller than 1 day prior. There is no appreciable tension component.  There is underlying emphysematous change. There are scattered areas of lung scarring bilaterally. There is no edema or consolidation on the left. Small nodular opacities on both sides of appears stable compared to CT study 1 day prior. Heart size is normal. Pulmonary  vascularity reflects underlying emphysema. No adenopathy.  IMPRESSION: Persistent hydropneumothorax in the right, slightly smaller than 1 day prior. No tension component. Underlying emphysema. Multiple small nodular opacities appear stable.   Electronically Signed   By: Lowella Grip M.D.   On: 06/22/2014 12:58   Ct Chest Wo Contrast  06/21/2014   CLINICAL DATA:  Preop right hydropneumothorax  EXAM: CT CHEST WITHOUT CONTRAST  TECHNIQUE: Multidetector CT imaging of the chest was performed following the standard protocol without IV contrast.  COMPARISON:  Chest radiograph dated 06/19/2014. Partial comparison to CT abdomen pelvis dated 02/04/2013.  FINDINGS: Moderate right hydropneumothorax. Layering debris within the right pleural collection,  hyperdense, possibly reflecting hemorrhage.  Subpleural opacities/nodularity, right lung predominant, favored to reflect endobronchial spread of infection. When correlating with prior CT abdomen pelvis, the appearance of the lung bases are new.  Compressive atelectasis in the right lower lobe.  Visualized thyroid is unremarkable.  The heart is normal in size. No pericardial effusion. Coronary atherosclerosis. Atherosclerotic calcifications of the aortic arch.  Small mediastinal lymph nodes, which do not meet pathologic CT size criteria.  Visualized upper abdomen is notable for small hepatic cysts.  Degenerative changes of the visualized thoracolumbar spine.  IMPRESSION: Moderate right hydro pneumothorax with suspected layering debris/hemorrhage.  Underlying multifocal subpleural opacity/nodularity, right lung predominant, favored to reflect endobronchial spread of infection. Consider follow-up CT chest in 4-6 weeks to document resolution.  Underlying compressive atelectasis in the right lower lobe.   Electronically Signed   By: Julian Hy M.D.   On: 06/21/2014 15:58   Dg Chest Port 1 View  06/23/2014   CLINICAL DATA:  Evaluate pneumothorax  EXAM: PORTABLE CHEST - 1 VIEW  COMPARISON:  Prior chest x-ray 06/22/2014  FINDINGS: No definite residual apical pneumothorax. There is a tiny basilar pneumothorax in the right costophrenic angle. Chest tubes remain in good position. There are 3. Right IJ central venous catheter with the tip at the superior cavoatrial junction. Slightly decreased subcutaneous emphysema along the right lateral chest wall. Persistent right basilar airspace opacity consistent with a combination of atelectasis and infiltrate. Lower inspiratory volumes. Slightly increased subsegmental atelectasis in the left base and mid lung. a. Stable cardiac and mediastinal contours. Aortic atherosclerosis. No acute osseous abnormality.  IMPRESSION: 1. Persistent small basilar pneumothorax in the right  costophrenic angle. 2. Interval resolution of right apical pneumothorax. 3. Stable and satisfactory support apparatus. 4. Slightly lower inspiratory volumes with increased subsegmental atelectasis.   Electronically Signed   By: Jacqulynn Cadet M.D.   On: 06/23/2014 07:40   Dg Chest Port 1 View  06/22/2014   CLINICAL DATA:  Recent hydropneumothorax  EXAM: PORTABLE CHEST - 1 VIEW  COMPARISON:  Study obtained earlier in the day  FINDINGS: There are now 2 chest tubes on the right. There is currently a minimal right apical pneumothorax. There is moderate soft tissue air on the right. There has been essentially complete resolution of effusion on the right. There is patchy atelectasis in the right base.  The lungs are elsewhere clear except for the small nodular lesions noted previously. The heart size and pulmonary vascularity are normal. There is no a central catheter with the tip in the superior vena cava.  IMPRESSION: Only minimal pneumothorax remains in the right apex following chest tube placements on the right. There is right base atelectasis but no appreciable effusion. There is moderate subcutaneous air on the right. Central catheter tip in superior vena cava. Small nodular opacities remain bilaterally, unchanged.  Electronically Signed   By: Lowella Grip M.D.   On: 06/22/2014 17:01   Chest tube: + air leak, 600 cc recorded   Assessment/Plan: S/P Procedure(s) (LRB): VIDEO ASSISTED THORACOSCOPY (VATS)/DECORTICATION (Right) DRAINAGE OF PLEURAL EFFUSION (Right) Mobilize d/c a line H/h stable,+ leukocytosis  D/c foley Advance diet Cont pca  pulm toilet Routine activity advancement     Henry [name] E 06/23/2014 7:48 AM

## 2014-06-23 NOTE — Progress Notes (Signed)
Inpatient Diabetes Program Recommendations  AACE/ADA: New Consensus Statement on Inpatient Glycemic Control (2013)  Target Ranges:  Prepandial:   less than 140 mg/dL      Peak postprandial:   less than 180 mg/dL (1-2 hours)      Critically ill patients:  140 - 180 mg/dL   Reason for Assessment:  Results for Henry [name], Henry [name] (MRN [id number]) as of 06/23/2014 11:20  Ref. Range 06/23/2014 04:00  Glucose Latest Range: 70-99 mg/dL 201 (H)    Diabetes history: No history  May consider checking CBG's tid with meals and HS while patient is in the hospital and cover with Novolog correction if CBG's are greater than 150 mg/dL.    Thanks, Adah Perl, RN, BC-ADM Inpatient Diabetes Coordinator Pager [phone]

## 2014-06-23 NOTE — Op Note (Signed)
NAMECUTHBERT, Henry [name] NO.:  [id number]  MEDICAL RECORD NO.:  [id number]  LOCATION:  3S11C                        FACILITY:  [facility]  PHYSICIAN:  Revonda Standard. Roxan Hockey, M.D.DATE OF BIRTH:  1951-01-16  DATE OF PROCEDURE:  06/22/2014 DATE OF DISCHARGE:                              OPERATIVE REPORT   PREOPERATIVE DIAGNOSIS:  Exudative right pleural effusion.  POSTOPERATIVE DIAGNOSIS:  Malignant right pleural effusion.  PROCEDURE:  Right video-assisted thoracoscopy, drainage of pleural effusion, pleural biopsy, partial decortication, talc pleurodesis.  SURGEON:  Revonda Standard. Roxan Hockey, MD  FIRST ASSISTANT:  John Giovanni, PA-C  ANESTHESIA:  General.  FINDINGS:  Approximately 1 Liter bloody pericardial effusion. Lower and middle lobes trapped. 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 Unable to achieve complete reexpansion of lower and middle lobes.  CLINICAL NOTE:  Henry [name] is a 63 year old gentleman with a history of tobacco abuse and hemochromatosis, who had been having shortness of breath and pleuritic chest pain for the past 3 weeks. A chest x-ray showed a large right pleural effusion.  Thoracentesis drained 2 L of serosanguineous fluid.  Cytology showed only inflammatory cells, there was no malignancy seen.  The LDH was elevated.  A followup chest x-ray showed a trapped lung with a persistent basilar space and a  partially re-accumulated pleural effusion.  He was advised to undergo right VATS, drainage of the effusion and decortication.  The indications, risks, benefits, and alternatives were discussed in detail with the patient.  He understood and accepted the risks and agreed to proceed.  OPERATIVE NOTE:  Henry [name] was brought to the preoperative holding area on June 22, 2014, there Anesthesia placed an arterial blood pressure monitoring line and central venous line.  He was taken to the  operating room, anesthetized, and intubated with a double-lumen endotracheal tube. A Foley catheter was placed.  Sequential compression devices were placed on the legs for DVT prophylaxis.  He was placed in the left lateral decubitus position and the right chest was prepped and draped in usual sterile fashion.  Single lung ventilation of the left lung was initiated and was tolerated well throughout the procedure.  An incision was made in approximately the 7th intercostal space in the midaxillary line. It was carried through the skin and subcutaneous tissue. The chest was entered bluntly using a hemostat.  A port was inserted, and the 10 mm thoracoscope was placed into the chest.  There were adhesions of the lung to the lateral chest wall.  There was a bloody Effusion and there was obvious tumor involvement of the parietal pleura and diaphragm and trapping of the lower and middle lobes with a visceral peel that caused multiple invaginations of the lung.  A small working incision was made in the 5th intercostal space, anterolaterally.  No rib spreading was performed during the procedure.  The pleural effusion was evacuated.  There was approximately one liter of fluid.  The adhesions were easily taken down with blunt dissection.  A biopsy was performed of the parietal pleura and this was sent for frozen section.  Additional tumor was taken off the parietal pleura and the diaphragm and sent  for permanent pathology.  Next, attention was turned to the lung. The upper lobe was relatively completely expanded.  The fissures had some mild adhesions, these were taken down without difficulty.  Along the inferior aspect of the middle lobe, there was a tumor contracting and causing invaginations of the middle lobe which impeded it's ability to re-expand.  By this point, the frozen section returned showing adenocarcinoma.  An attempt to take off the visceral peel on the middle lobe was Unsuccessful.  Small portions of this could be taken off, but the majority of it was intimately involved with the lung.  Electrocautery was used to score the adhesions to allow some re-expansion of the middle lobe.  Next, attention was turned to the lower lobe and there was a similar scenario with the worst involvement being on the diaphragmatic surface.  The lung on the right was reinflated and again electrocautery was used primarily, along with some stripping of areas where there was a peel that could be removed. In the majority of areas, there was scarring with contracture and tumor that was causing the lung to be entrapped.  Electrocautery was again used to free up as much as the lung as possible.  There were some tears along the diaphragmatic surface of the lower lobe.  Progel was applied to these areas. With positive pressure to 30 cm of water on the right lung, there was approximately 90% reexpansion.  It was felt that additional attempts to re-expand the lung further would be likely to result in more severe air leaks, and the decision was made to stop at that point.  Given that this was a malignant effusion with extensive involvement of the chest wall, the decision was made to do a talc pleurodesis.  4 g of talc was insufflated into the chest. There was good distribution.  A 36 Blake drain was placed through the original port incision and directed posteriorly towards the apex.  A second 36-French Blake drain was placed through a second incision and placed along the diaphragm and finally a 28-French chest tube was placed through the third port incision and directed apically, all were secured with #1 silk sutures.  The right lung was reinflated.  The incision was closed with a running #1 Vicryl fascial suture followed by 2-0 Vicryl subcutaneous suture and a 3-0 Vicryl subcuticular suture.  The patient was extubated in the operating room, taken to the postanesthetic care unit in good  condition.     Revonda Standard Roxan Hockey, M.D.     SCH/MEDQ  D:  06/22/2014  T:  06/23/2014  Job:  [id number]

## 2014-06-23 NOTE — Progress Notes (Signed)
1 Day Post-Op Procedure(s) (LRB): VIDEO ASSISTED THORACOSCOPY (VATS)/DECORTICATION (Right) DRAINAGE OF PLEURAL EFFUSION (Right) Subjective: Some discomfort but overall feels well  Objective: Vital signs in last 24 hours: Temp:  [96.7 F (35.9 C)-98.4 F (36.9 C)] 98.1 F (36.7 C) (08/07 0900) Pulse Rate:  [48-66] 59 (08/07 0451) Cardiac Rhythm:  [-] Normal sinus rhythm (08/06 1945) Resp:  [12-20] 18 (08/07 0800) BP: (99-129)/(45-84) 99/45 mmHg (08/07 0451) SpO2:  [97 %-100 %] 98 % (08/07 0851) Arterial Line BP: (105-115)/(58-66) 115/66 mmHg (08/06 1800) FiO2 (%):  [28 %] 28 % (08/07 0851) Weight:  [146 lb (66.225 kg)] 146 lb (66.225 kg) (08/06 1840)  Hemodynamic parameters for last 24 hours:    Intake/Output from previous day: 08/06 0701 - 08/07 0700 In: 925 [I.V.:925] Out: 1225 [Urine:525; Blood:100; Chest Tube:600] Intake/Output this shift: Total I/O In: -  Out: 200 [Urine:200]  General appearance: alert and no distress Neurologic: intact Heart: regular rate and rhythm Lungs: diminished breath sounds right base Abdomen: normal findings: soft, non-tender + air leak, serosanguinous drainage  Lab Results:  Recent Labs  06/20/14 1524 06/23/14 0400  WBC 9.3 14.6*  HGB 17.6* 17.3*  HCT 48.7 48.9  PLT 247 242   BMET:  Recent Labs  06/20/14 1524 06/23/14 0400  NA 138 138  K 3.6* 3.8  CL 102 99  CO2 20 26  GLUCOSE 122* 201*  BUN 10 9  CREATININE 0.57 0.68  CALCIUM 9.0 8.3*    PT/INR:  Recent Labs  06/20/14 1524  LABPROT 14.5  INR 1.13   ABG    Component Value Date/Time   PHART 7.396 06/23/2014 0430   HCO3 25.6* 06/23/2014 0430   TCO2 26.9 06/23/2014 0430   O2SAT 97.8 06/23/2014 0430   CBG (last 3)  No results found for this basename: GLUCAP,  in the last 72 hours  Assessment/Plan: S/P Procedure(s) (LRB): VIDEO ASSISTED THORACOSCOPY (VATS)/DECORTICATION (Right) DRAINAGE OF PLEURAL EFFUSION (Right) POD # 1 Looks great this AM  Keep chest tubes  to suction today- consider changing to water seal in AM if CXR Ok  Has a moderate air leak- should resolve with time  Pain well controlled with PCA  DVT prophylaxis- SCD, add enoxaparin  Advance diet  Adenocarcinoma - path pending, will need Oncology consult  He knows Dr. Beryle Beams, but knows Dr Beryle Beams no longer does Oncology   LOS: 1 day    HENDRICKSON,STEVEN C 06/23/2014

## 2014-06-23 NOTE — Progress Notes (Addendum)
Pt was I&O cathed at 1850. He states he voided urine in the toilet will having a BM. Bladder scanner reads 664mL. Pt then voided 200 mL of clear yellow urine into urinal. Pt educated and states he does not want to be I&O cathed again. Will continue to monitor.

## 2014-06-23 NOTE — Progress Notes (Signed)
Utilization review completed.  

## 2014-06-23 NOTE — Discharge Summary (Signed)
Physician Discharge Summary  Patient ID: Henry [name] MRN: [id number] DOB/AGE: [birth year] 63 y.o.  Admit date: 06/22/2014 Discharge date: 06/28/2014  Admission Diagnoses:  Patient Active Problem List   Diagnosis Date Noted  . Protein-calorie malnutrition, severe 06/26/2014  . Lung cancer 06/22/2014  . Pleural effusion on right 12/16/2013  . Nonspecific (abnormal) findings on radiological and other examination of gastrointestinal tract 04/07/2013  . Unspecified gastritis and gastroduodenitis without mention of hemorrhage 04/07/2013  . HTN (hypertension) 01/31/2013  . Hx of adenomatous colonic polyps 01/31/2013  . Hemochromatosis 01/31/2013   Discharge Diagnoses:   Adenocarcinoma- malignant right pleural effusion Patient Active Problem List   Diagnosis Date Noted  . Protein-calorie malnutrition, severe 06/26/2014  . Lung cancer 06/22/2014  . Pleural effusion on right 12/16/2013  . Nonspecific (abnormal) findings on radiological and other examination of gastrointestinal tract 04/07/2013  . Unspecified gastritis and gastroduodenitis without mention of hemorrhage 04/07/2013  . HTN (hypertension) 01/31/2013  . Hx of adenomatous colonic polyps 01/31/2013  . Hemochromatosis 01/31/2013   Discharged Condition: good  History of Present Illness:   Henry [name] is a 63 yo man with significant history of smoking.  The patient was in his usual state of health until about 2-3 weeks ago when he developed shortness of breath with exertion.  This was associated with wheezing and coughing more frequently.  This continued to progress to the point where the patient developed short of breath when he tried to lay on his right side.  This continued to progress to the point the patient developed severe pleuritic chest pain on the right side with cough and deep breath.  He evaluated by Dr. Marisue Humble which obtained a CXR which showed a large right sided pleural effusion.  He was subsequently referred to [facility] for  possible surgical drainage.  He was initially evaluated by Dr. Roxan Hockey on 06/16/2014.  At that visit the patient admitted to about a 25 lb weight loss over the last 6 months.  He continues to smoke 1.5 packs of cigarettes per day.  After review of the CXR, Dr. Roxan Hockey performed a bedside Thoracentesis in the office at which time 2L of fluid was removed.  This was sent for culture and cytology.  Follow up CXR showed incomplete re expansion of the lower and middle lobes which was felt to most likely be trapped lung.  He was instructed to RTC in 1 week with a CXR to assess possible need for surgical decortication.  He was again evaluated by Dr. Roxan Hockey on 06/19/2014 at which time it was felt he would benefit from surgical decortication to treat the effusion and re-expand the lung.  It was also felt the patient should undergo CT scan of the chest to ensure no obstructing mass is present.  The risks and benefits of the procedure were explained to the patient and he was agreeable to proceed.    Hospital Course:   The patient presented to [facility] on 06/22/2014.  He was taken to the operating room and underwent Right Video Assisted Thoracoscopy, drainage of pleural effusion, pleural biopsy, partial decortication, and talc pleurodesis.  He tolerated the procedure without difficulty, was extubated and taken to the PACU in stable condition.  The patient is progressing well since surgery.  His chest tubes do exhibit evidence of an air leak with a small basilar pneumothorax.  This continued to for several days.  Once drainage had decreased his posterior chest tube was removed without difficulty.  The remaining chest tube  continued to have a small air leak.  This has since resolved and his final chest tube has been removed.  Follow up CXR was obtained and showed a small apical and basilar pneumothorax.  This has remained stable and patient is asymptomatic.  He is medically stable for discharge and will be  discharged home today 06/28/14.   Significant Diagnostic Studies: radiology:   CT scan:   Moderate right hydro pneumothorax with suspected layering  debris/hemorrhage.  Underlying multifocal subpleural opacity/nodularity, right lung  predominant, favored to reflect endobronchial spread of infection.  Consider follow-up CT chest in 4-6 weeks to document resolution.  Underlying compressive atelectasis in the right lower lobe.   Pathology:   1. Pleura, biopsy, right parietal - ADENOCARCINOMA. 2. Pleura, biopsy, right parietal #2 - ADENOCARCINOMA. 3. Pleura, peel, right visceral - INFLAMMATION AND FOCAL NECROSIS. - SEE MICROSCOPIC DESCRIPTION.  Treatments: surgery:   Right video-assisted thoracoscopy, drainage of pleural effusion, pleural biopsy, partial decortication, talc pleurodesis  Disposition: 01-Home or Self Care  Discharge medications:     Medication List    STOP taking these medications       ciprofloxacin 500 MG tablet  Commonly known as:  CIPRO      TAKE these medications       albuterol 108 (90 BASE) MCG/ACT inhaler  Commonly known as:  PROVENTIL HFA;VENTOLIN HFA  Inhale 1-2 puffs into the lungs every 4 (four) hours as needed for wheezing or shortness of breath.     ALIGN 4 MG Caps  Take 4 mg by mouth daily.     diltiazem 360 MG 24 hr capsule  Commonly known as:  CARDIZEM CD  Take 360 mg by mouth every morning.     hydrochlorothiazide 25 MG tablet  Commonly known as:  HYDRODIURIL  Take 25 mg by mouth daily.     nebivolol 5 MG tablet  Commonly known as:  BYSTOLIC  Take 5 mg by mouth daily.     oxyCODONE 5 MG immediate release tablet  Commonly known as:  Oxy IR/ROXICODONE  Take 1-2 tablets (5-10 mg total) by mouth every 4 (four) hours as needed for severe pain.     potassium chloride SA 20 MEQ tablet  Commonly known as:  K-DUR,KLOR-CON  Take 40 mEq by mouth daily.     tamsulosin 0.4 MG Caps capsule  Commonly known as:  FLOMAX  Take 1 capsule  (0.4 mg total) by mouth daily.     VOLTAREN 1 % Gel  Generic drug:  diclofenac sodium  Apply 2 g topically 3 (three) times a week.          Follow-up Information   Follow up with [name], MD On 07/18/2014. (Appointment is at 2:45)    Specialty:  Cardiothoracic Surgery   Contact information:   [address] [place] [address] [phone]       Follow up with [facility] On 07/18/2014. (Please get CXR at 1:45)    Contact information:   [place]       Follow up with [facility] On 07/03/2014. (suture removal at 10:00)       Follow up with Eilleen Kempf., MD On 06/29/2014. (Appointment is at 1:00)    Specialty:  Oncology   Contact information:   [address] [place] [address] [phone]       Follow up with [facility] On 06/29/2014. (Appointment is at 5:00)    Specialty:  Rehabilitation   Contact  information:   [address] [id number] [place] [address] [phone]      Signed: Ellwood Handler 06/28/2014, 9:26 AM

## 2014-06-24 ENCOUNTER — Inpatient Hospital Stay (HOSPITAL_COMMUNITY): Payer: BC Managed Care – PPO

## 2014-06-24 LAB — COMPREHENSIVE METABOLIC PANEL
ALBUMIN: 2.6 g/dL — AB (ref 3.5–5.2)
ALK PHOS: 54 U/L (ref 39–117)
ALT: 11 U/L (ref 0–53)
AST: 13 U/L (ref 0–37)
Anion gap: 10 (ref 5–15)
BUN: 11 mg/dL (ref 6–23)
CALCIUM: 8.6 mg/dL (ref 8.4–10.5)
CO2: 28 mEq/L (ref 19–32)
Chloride: 102 mEq/L (ref 96–112)
Creatinine, Ser: 0.75 mg/dL (ref 0.50–1.35)
GFR calc Af Amer: >90 mL/min (ref >90)
GFR calc non Af Amer: >90 mL/min (ref >90)
GLUCOSE: 104 mg/dL — AB (ref 70–99)
POTASSIUM: 3.3 meq/L — AB (ref 3.7–5.3)
SODIUM: 140 meq/L (ref 137–147)
Total Bilirubin: 0.6 mg/dL (ref 0.3–1.2)
Total Protein: 5.3 g/dL — ABNORMAL LOW (ref 6.0–8.3)

## 2014-06-24 LAB — CBC
HCT: 45.3 % (ref 39.0–52.0)
HEMOGLOBIN: 16 g/dL (ref 13.0–17.0)
MCH: 34 pg (ref 26.0–34.0)
MCHC: 35.3 g/dL (ref 30.0–36.0)
MCV: 96.2 fL (ref 78.0–100.0)
Platelets: 228 10*3/uL (ref 150–400)
RBC: 4.71 MIL/uL (ref 4.22–5.81)
RDW: 14.4 % (ref 11.5–15.5)
WBC: 13 10*3/uL — ABNORMAL HIGH (ref 4.0–10.5)

## 2014-06-24 MED ORDER — GUAIFENESIN ER 600 MG PO TB12
600.0000 mg | ORAL_TABLET | Freq: Two times a day (BID) | ORAL | Status: DC
  Administered 2014-06-24 – 2014-06-28 (×9): 600 mg via ORAL
  Filled 2014-06-24 (×10): qty 1

## 2014-06-24 MED ORDER — TAMSULOSIN HCL 0.4 MG PO CAPS
0.4000 mg | ORAL_CAPSULE | Freq: Every day | ORAL | Status: DC
  Administered 2014-06-24 – 2014-06-28 (×5): 0.4 mg via ORAL
  Filled 2014-06-24 (×5): qty 1

## 2014-06-24 MED ORDER — POTASSIUM CHLORIDE CRYS ER 20 MEQ PO TBCR
40.0000 meq | EXTENDED_RELEASE_TABLET | Freq: Once | ORAL | Status: AC
  Administered 2014-06-24: 40 meq via ORAL
  Filled 2014-06-24: qty 2

## 2014-06-24 NOTE — Progress Notes (Addendum)
      [address]       [place] [address]             [phone]       2 Days Post-Op Procedure(s) (LRB): VIDEO ASSISTED THORACOSCOPY (VATS)/DECORTICATION (Right) DRAINAGE OF PLEURAL EFFUSION (Right)  Subjective: Patient with bowel movement yesterday.   Objective: Vital signs in last 24 hours: Temp:  [97.6 F (36.4 C)-98.5 F (36.9 C)] 97.8 F (36.6 C) (08/08 0750) Pulse Rate:  [63-72] 66 (08/08 0750) Cardiac Rhythm:  [-] Normal sinus rhythm (08/07 2330) Resp:  [15-25] 18 (08/08 0800) BP: (95-110)/(59-72) 110/72 mmHg (08/08 0750) SpO2:  [94 %-100 %] 100 % (08/08 0800)     Intake/Output from previous day: 08/07 0701 - 08/08 0700 In: 366.8 [P.O.:240; I.V.:126.8] Out: 1190 [Urine:950; Chest Tube:240]   Physical Exam:  Cardiovascular: RRR Pulmonary: Diminished on the right base; left lung is clear; no rales, wheezes, or rhonchi. Abdomen: Soft, non tender, bowel sounds present. Extremities: No lower extremity edema. Wounds: Slight bloody ooze from corner of incision. Remainder of incision clean and dry. Chest Tubes: to suction and air leak that worsens with cough  Lab Results: CBC: Recent Labs  06/23/14 0400 06/24/14 0346  WBC 14.6* 13.0*  HGB 17.3* 16.0  HCT 48.9 45.3  PLT 242 228   BMET:  Recent Labs  06/23/14 0400 06/24/14 0346  NA 138 140  K 3.8 3.3*  CL 99 102  CO2 26 28  GLUCOSE 201* 104*  BUN 9 11  CREATININE 0.68 0.75  CALCIUM 8.3* 8.6    PT/INR: No results found for this basename: LABPROT, INR,  in the last 72 hours ABG:  INR: Will add last result for INR, ABG once components are confirmed Will add last 4 CBG results once components are confirmed  Assessment/Plan:  1. CV - SR in the 70's. 2.  Pulmonary - Chest tube with 240 cc last 24 hours. There is an air leak that is worth with cough. NO CXR taken today. Will order. Final pathology pending.  3. Urinary retention. I&O cath'd last evening. Will start Flomax. No  previous history of BPH. No GU complaints. Continue to monitor 4. Hypokalemia-supplement 5. Mucinex for cough  Henry [name],Henry [name] 06/24/2014,9:16 AM  Still with air leak I have seen and examined Henry [name] and agree with the above assessment  and plan.  Grace Isaac MD Beeper [phone] Office [phone] 06/24/2014 12:22 PM

## 2014-06-25 ENCOUNTER — Inpatient Hospital Stay (HOSPITAL_COMMUNITY): Payer: BC Managed Care – PPO

## 2014-06-25 LAB — BODY FLUID CULTURE: CULTURE: NO GROWTH

## 2014-06-25 NOTE — Progress Notes (Addendum)
      [address]       [place] [address]             [phone]       3 Days Post-Op Procedure(s) (LRB): VIDEO ASSISTED THORACOSCOPY (VATS)/DECORTICATION (Right) DRAINAGE OF PLEURAL EFFUSION (Right)  Subjective: Patient with some increase soreness at right chest tube sites.  Objective: Vital signs in last 24 hours: Temp:  [97.4 F (36.3 C)-98.8 F (37.1 C)] 97.8 F (36.6 C) (08/09 0821) Pulse Rate:  [67-77] 67 (08/09 0821) Cardiac Rhythm:  [-] Normal sinus rhythm (08/09 0821) Resp:  [16-29] 16 (08/09 0821) BP: (109-130)/(64-85) 123/85 mmHg (08/09 0821) SpO2:  [94 %-100 %] 96 % (08/09 0821)     Intake/Output from previous day: 08/08 0701 - 08/09 0700 In: 830 [P.O.:600; I.V.:230] Out: 600 [Urine:400; Chest Tube:200]   Physical Exam:  Cardiovascular: RRR Pulmonary: Diminished on the right base; left lung is clear; no rales, wheezes, or rhonchi. Subcutaneous emphysema right lateral and portion of anterior chest wall Abdomen: Soft, non tender, bowel sounds present. Extremities: No lower extremity edema. Wounds: Slight bloody ooze from corner of incision. Remainder of incision clean and dry. Chest Tubes: to water seal  and there is an air leak that worsens with cough  Lab Results: CBC:  Recent Labs  06/23/14 0400 06/24/14 0346  WBC 14.6* 13.0*  HGB 17.3* 16.0  HCT 48.9 45.3  PLT 242 228   BMET:   Recent Labs  06/23/14 0400 06/24/14 0346  NA 138 140  K 3.8 3.3*  CL 99 102  CO2 26 28  GLUCOSE 201* 104*  BUN 9 11  CREATININE 0.68 0.75  CALCIUM 8.3* 8.6    PT/INR: No results found for this basename: LABPROT, INR,  in the last 72 hours ABG:  INR: Will add last result for INR, ABG once components are confirmed Will add last 4 CBG results once components are confirmed  Assessment/Plan:  1. CV - SR in the 70's. 2.  Pulmonary -  Chest tube with 200 cc last 24 hours. Chest tubes are to water seal.There is an air leak that is worth  with cough. CXR shows subcutaneous emphysema, small right apical pneuomthorax and a right basilar pneumothorax. Will need to place CTs back to suction. Final pathology pending.  3. Had Urinary retention.Flomax started yesterday.No previous history of BPH. No GU complaints. Able to void on own.   ZIMMERMAN,DONIELLE MPA-C 06/25/2014,8:46 AM   Increased subq air today, back on sunction, air leak from middle tube, will d/c posterior tube today I have seen and examined Henry [name] and agree with the above assessment  and plan.  Grace Isaac MD Beeper [phone] Office [phone] 06/25/2014 10:41 AM

## 2014-06-26 ENCOUNTER — Inpatient Hospital Stay (HOSPITAL_COMMUNITY): Payer: BC Managed Care – PPO

## 2014-06-26 DIAGNOSIS — E43 Unspecified severe protein-calorie malnutrition: Secondary | ICD-10-CM | POA: Insufficient documentation

## 2014-06-26 LAB — TISSUE CULTURE: Culture: NO GROWTH

## 2014-06-26 MED ORDER — ENSURE COMPLETE PO LIQD
237.0000 mL | Freq: Two times a day (BID) | ORAL | Status: DC
  Administered 2014-06-26 – 2014-06-27 (×2): 237 mL via ORAL

## 2014-06-26 MED ORDER — BOOST / RESOURCE BREEZE PO LIQD
1.0000 | Freq: Two times a day (BID) | ORAL | Status: DC
  Administered 2014-06-26 – 2014-06-28 (×3): 1 via ORAL

## 2014-06-26 NOTE — Progress Notes (Signed)
4 Days Post-Op Procedure(s) (LRB): VIDEO ASSISTED THORACOSCOPY (VATS)/DECORTICATION (Right) DRAINAGE OF PLEURAL EFFUSION (Right) Subjective: C/o not being able to eat much of his food because of issues with his diet  Objective: Vital signs in last 24 hours: Temp:  [97.7 F (36.5 C)-98.8 F (37.1 C)] 98.2 F (36.8 C) (08/10 0734) Pulse Rate:  [60-67] 60 (08/10 0317) Cardiac Rhythm:  [-] Normal sinus rhythm (08/09 2007) Resp:  [13-20] 18 (08/10 0400) BP: (99-123)/(58-85) 100/58 mmHg (08/10 0317) SpO2:  [95 %-100 %] 96 % (08/10 0400)  Hemodynamic parameters for last 24 hours:    Intake/Output from previous day: 08/09 0701 - 08/10 0700 In: 100 [I.V.:100] Out: 610 [Urine:450; Chest Tube:160] Intake/Output this shift: Total I/O In: -  Out: 400 [Urine:400]  General appearance: alert and no distress Neurologic: intact Heart: regular rate and rhythm Lungs: diminished breath sounds right base + small air leak  Lab Results:  Recent Labs  06/24/14 0346  WBC 13.0*  HGB 16.0  HCT 45.3  PLT 228   BMET:  Recent Labs  06/24/14 0346  NA 140  K 3.3*  CL 102  CO2 28  GLUCOSE 104*  BUN 11  CREATININE 0.75  CALCIUM 8.6    PT/INR: No results found for this basename: LABPROT, INR,  in the last 72 hours ABG    Component Value Date/Time   PHART 7.396 06/23/2014 0430   HCO3 25.6* 06/23/2014 0430   TCO2 26.9 06/23/2014 0430   O2SAT 97.8 06/23/2014 0430   CBG (last 3)  No results found for this basename: GLUCAP,  in the last 72 hours  Assessment/Plan: S/P Procedure(s) (LRB): VIDEO ASSISTED THORACOSCOPY (VATS)/DECORTICATION (Right) DRAINAGE OF PLEURAL EFFUSION (Right) - Overall stable  Still has an air leak but has decreased significantly since Friday, drainage down significantly Air leak is isolated to the diaphragmatic blake drain Dc anterior CT Leave Blake drain to water seal  Path still pending  Continue ambulation  SCD for DVT prophylaxis     LOS: 4 days     Dorathy Stallone C 06/26/2014

## 2014-06-26 NOTE — Progress Notes (Signed)
INITIAL NUTRITION ASSESSMENT  DOCUMENTATION CODES Per approved criteria  -Severe malnutrition in the context of chronic illness   INTERVENTION:  Ensure Complete PO BID, each supplement provides 350 kcal and 13 grams of protein  Resource Breeze PO BID, each supplement provides 250 kcal and 9 grams of protein  Continue Gluten free and Lactose free diet.   NUTRITION DIAGNOSIS: Malnutrition related to inadequate oral intake as evidenced by severe depletion of subcutaneous fat and muscle mass.   Goal: Intake to meet >90% of estimated nutrition needs.  Monitor:  PO intake, labs, weight trend.  Reason for Assessment: MST  63 y.o. male  Admitting Dx: Right Pleural Effusion S/P VATS with drainage of pleural effusion 8/6  ASSESSMENT: 63 yo man with an 80 pack year history of smoking and hemochromatosis. Found to have right pleural effusion, S/P thoracentesis. Post-drainage CXR showed trapped lung.  Patient reports weight loss started ~1-1.5 years ago, he weighed ~196 lbs 1 year ago. Has had trouble in the hospital finding foods that he can have that are gluten free and lactose free. He reports that he has just recently started a gluten free and lactose free diet to help with his GI symptoms. Agreed to try Ensure Complete and Resource Breeze to help maximize oral intake. Patient has tried a protein supplement provided by his son, which he tolerated well. He reports that he usually consumes ~1200-1400 calories per day.   Nutrition Focused Physical Exam:  Subcutaneous Fat:  Orbital Region: mild depletion Upper Arm Region: severe depletion Thoracic and Lumbar Region: NA  Muscle:  Temple Region: severe depletion Clavicle Bone Region: severe depletion Clavicle and Acromion Bone Region: severe depletion Scapular Bone Region: NA Dorsal Hand: severe depletion Patellar Region: NA Anterior Thigh Region: NA Posterior Calf Region: NA  Edema: NA    Height: Ht Readings from Last 1  Encounters:  06/22/14 6' (1.829 m)    Weight: Wt Readings from Last 1 Encounters:  06/22/14 146 lb (66.225 kg)    Ideal Body Weight: 80.9 kg  % Ideal Body Weight: 82%  Wt Readings from Last 10 Encounters:  06/22/14 146 lb (66.225 kg)  06/22/14 146 lb (66.225 kg)  06/20/14 146 lb 9.7 oz (66.5 kg)  06/19/14 150 lb (68.04 kg)  06/16/14 150 lb (68.04 kg)  04/15/13 196 lb (88.905 kg)  04/07/13 196 lb (88.905 kg)  04/07/13 196 lb (88.905 kg)  02/22/13 200 lb 6.4 oz (90.901 kg)  01/31/13 207 lb 3.2 oz (93.985 kg)    Usual Body Weight: 196 lb  % Usual Body Weight: 74%  BMI:  Body mass index is 19.8 kg/(m^2).  Estimated Nutritional Needs: Kcal: 2000-2200 Protein: 100-110 gm Fluid: >/= 2 L  Skin: WDL  Diet Order: Gluten Restricted  EDUCATION NEEDS: -Education needs addressed   Intake/Output Summary (Last 24 hours) at 06/26/14 1000 Last data filed at 06/26/14 0800  Gross per 24 hour  Intake 338.99 ml  Output   1020 ml  Net -681.01 ml    Last BM: 8/9   Labs:   Recent Labs Lab 06/20/14 1524 06/23/14 0400 06/24/14 0346  NA 138 138 140  K 3.6* 3.8 3.3*  CL 102 99 102  CO2 20 26 28   BUN 10 9 11   CREATININE 0.57 0.68 0.75  CALCIUM 9.0 8.3* 8.6  GLUCOSE 122* 201* 104*    CBG (last 3)  No results found for this basename: GLUCAP,  in the last 72 hours  Scheduled Meds: . acetaminophen  1,000 mg  Oral 4 times per day   Or  . acetaminophen (TYLENOL) oral liquid 160 mg/5 mL  1,000 mg Oral 4 times per day  . bisacodyl  10 mg Oral Daily  . diltiazem  360 mg Oral q morning - 10a  . fentaNYL   Intravenous 6 times per day  . guaiFENesin  600 mg Oral BID  . nebivolol  5 mg Oral Daily  . senna-docusate  1 tablet Oral QHS  . tamsulosin  0.4 mg Oral Daily    Continuous Infusions: . sodium chloride 10 mL/hr at 06/24/14 6063    Past Medical History  Diagnosis Date  . Hypertension   . GERD (gastroesophageal reflux disease)   . Hemochromatosis   . Hx of  adenomatous colonic polyps   . Umbilical hernia   . Diverticulosis   . Lumbar spondylosis     ankle  . Pleural effusion on right 12/16/13    per chest xray  . Shortness of breath     with  exertion    Past Surgical History  Procedure Laterality Date  . Lumbar laminectomy  1983  . Knee arthrsoscopy  2003    left  . Tonsillectomy  1962  . Eus N/A 04/07/2013    Procedure: UPPER ENDOSCOPIC ULTRASOUND (EUS) LINEAR;  Surgeon: [name], MD;  Location: [facility];  Service: Endoscopy;  Laterality: N/A;  . Video assisted thoracoscopy (vats)/decortication Right 06/22/2014    Procedure: VIDEO ASSISTED THORACOSCOPY (VATS)/DECORTICATION;  Surgeon: [name], MD;  Location: [facility];  Service: Thoracic;  Laterality: Right;  . Pleural effusion drainage Right 06/22/2014    Procedure: DRAINAGE OF PLEURAL EFFUSION;  Surgeon: [name], MD;  Location: [facility];  Service: Thoracic;  Laterality: Right;    Molli Barrows, [place], [place], [place] Pager [phone] After Hours Pager [phone]

## 2014-06-26 NOTE — Progress Notes (Signed)
Utilization review completed.  

## 2014-06-27 ENCOUNTER — Inpatient Hospital Stay (HOSPITAL_COMMUNITY): Payer: BC Managed Care – PPO

## 2014-06-27 ENCOUNTER — Encounter: Payer: Self-pay | Admitting: *Deleted

## 2014-06-27 MED ORDER — TAMSULOSIN HCL 0.4 MG PO CAPS: 0.4000 mg | ORAL_CAPSULE | Freq: Every day | ORAL | Status: DC

## 2014-06-27 MED ORDER — OXYCODONE HCL 5 MG PO TABS: 5.0000 mg | ORAL_TABLET | ORAL | Status: DC | PRN

## 2014-06-27 NOTE — Progress Notes (Signed)
Pt insisted on going off floor, stated he was going to the solarium, pt still on telemetry. Notified Central Monitoring that he is off floor. Etta Quill, RN

## 2014-06-27 NOTE — Progress Notes (Signed)
5 Days Post-Op Procedure(s) (LRB): VIDEO ASSISTED THORACOSCOPY (VATS)/DECORTICATION (Right) DRAINAGE OF PLEURAL EFFUSION (Right) Subjective: Feels well and wants to go home  Objective: Vital signs in last 24 hours: Temp:  [97.6 F (36.4 C)-98.8 F (37.1 C)] 98.1 F (36.7 C) (08/11 0700) Pulse Rate:  [62-68] 68 (08/11 0514) Cardiac Rhythm:  [-] Normal sinus rhythm (08/10 1936) Resp:  [13-19] 18 (08/11 0739) BP: (97-127)/(60-90) 117/74 mmHg (08/11 0514) SpO2:  [96 %-97 %] 96 % (08/11 0739)  Hemodynamic parameters for last 24 hours:    Intake/Output from previous day: 08/10 0701 - 08/11 0700 In: 1575.4 [P.O.:1200; I.V.:375.4] Out: 1050 [Urine:1000; Chest Tube:50] Intake/Output this shift:    General appearance: alert and no distress Neurologic: intact Lungs: clear to auscultation bilaterally no air leak  Lab Results: No results found for this basename: WBC, HGB, HCT, PLT,  in the last 72 hours BMET: No results found for this basename: NA, K, CL, CO2, GLUCOSE, BUN, CREATININE, CALCIUM,  in the last 72 hours  PT/INR: No results found for this basename: LABPROT, INR,  in the last 72 hours ABG    Component Value Date/Time   PHART 7.396 06/23/2014 0430   HCO3 25.6* 06/23/2014 0430   TCO2 26.9 06/23/2014 0430   O2SAT 97.8 06/23/2014 0430   CBG (last 3)  No results found for this basename: GLUCAP,  in the last 72 hours  Assessment/Plan: S/P Procedure(s) (LRB): VIDEO ASSISTED THORACOSCOPY (VATS)/DECORTICATION (Right) DRAINAGE OF PLEURAL EFFUSION (Right) - DC CT  PATH- adenocarcinoma- unknown primary  If CXR OK post CT removal will dc later today  Will need MR brain and PET as outpatient- office will arrange  Has appointment in [place] this week  I will see back for surgical follow up in 2-3 weeks   LOS: 5 days    Joaopedro Eschbach C 06/27/2014

## 2014-06-27 NOTE — Progress Notes (Signed)
After chest tube removal there was a small mostly basilar pneumo on CXR  A repeat study several hours later showed the space to be a little bit larger.  I found Henry [name] at the elevator going down to [name] to get a sandwich. He is not having any difficulty breathing or pain out of the ordinary. He is able to ambulate without difficulty.  As it has been almost 3 hours since his last CXR, I do not think he has a large air leak. I think it is safe to observe him overnight and if the space is larger in the morning will have Radiology place a pigtail and then potentially could go home with a mini-xpress. Of course if there is a major pneumo he would probably be better off being put back to suction. If stable or decreased will dc home in AM'  Will keep a CT insertion tray at the desk outside his room in case he has trouble overnight

## 2014-06-27 NOTE — Progress Notes (Addendum)
PCA fentanyl discontinued per MD order. Wasted 52ml in sharps with Sophronia Simas, RN. Chest Tube discontinued per MD order, pt tolerated well, VSS, will continue to monitor. Etta Quill, RN

## 2014-06-27 NOTE — Progress Notes (Signed)
Called hospital to check on pt.  Nurse stated he would be going home tomorrow.  I instructed nurse to let pt know I will be calling tomorrow with an appt for Dr. Julien Nordmann.

## 2014-06-28 ENCOUNTER — Inpatient Hospital Stay (HOSPITAL_COMMUNITY): Payer: BC Managed Care – PPO

## 2014-06-28 ENCOUNTER — Other Ambulatory Visit: Payer: Self-pay | Admitting: *Deleted

## 2014-06-28 ENCOUNTER — Encounter: Payer: Self-pay | Admitting: *Deleted

## 2014-06-28 DIAGNOSIS — C3481 Malignant neoplasm of overlapping sites of right bronchus and lung: Secondary | ICD-10-CM

## 2014-06-28 NOTE — Progress Notes (Signed)
      [address]       [place] [address]             [phone]      6 Days Post-Op Procedure(s) (LRB): VIDEO ASSISTED THORACOSCOPY (VATS)/DECORTICATION (Right) DRAINAGE OF PLEURAL EFFUSION (Right)  Subjective:  Mr. Ryback has no new complaints this morning.  The patient does state he had a coughing episode at which point some fluid drained from his chest tube site.  He questions if pathology is back and what type of cancer he has.  I explained to the patient that we have preliminary pathology results, but we will require further testing and evaluation with oncology to give him full prognosis and explanation as to the type and nature of his cancer.  Objective: Vital signs in last 24 hours: Temp:  [97.4 F (36.3 C)-98.3 F (36.8 C)] 98 F (36.7 C) (08/12 0746) Pulse Rate:  [55-71] 63 (08/12 0746) Cardiac Rhythm:  [-] Normal sinus rhythm (08/12 0800) Resp:  [16-22] 19 (08/12 0746) BP: (109-133)/(71-81) 126/78 mmHg (08/12 0746) SpO2:  [97 %-98 %] 98 % (08/12 0746)  Intake/Output from previous day: 08/11 0701 - 08/12 0700 In: 91.4 [I.V.:91.4] Out: 125 [Chest Tube:125] Intake/Output this shift: Total I/O In: 360 [P.O.:360] Out: -   General appearance: alert, cooperative and no distress Heart: regular rate and rhythm Lungs: clear to auscultation bilaterally Wound: clean, some serous output on dressing  Lab Results: No results found for this basename: WBC, HGB, HCT, PLT,  in the last 72 hours BMET: No results found for this basename: NA, K, CL, CO2, GLUCOSE, BUN, CREATININE, CALCIUM,  in the last 72 hours  PT/INR: No results found for this basename: LABPROT, INR,  in the last 72 hours ABG    Component Value Date/Time   PHART 7.396 06/23/2014 0430   HCO3 25.6* 06/23/2014 0430   TCO2 26.9 06/23/2014 0430   O2SAT 97.8 06/23/2014 0430   CBG (last 3)  No results found for this basename: GLUCAP,  in the last 72 hours  Assessment/Plan: S/P Procedure(s)  (LRB): VIDEO ASSISTED THORACOSCOPY (VATS)/DECORTICATION (Right) DRAINAGE OF PLEURAL EFFUSION (Right)  1. Pneumothorax- post chest tube removal, CXR this morning shows improvement of apical and basilar portion of pneumothorax.  Sub -Q remains present and is stable 2. Dispo- patient is medically stable, no indication for chest tube placement at this time, will d/c home today.  Instructed patient to contact our office or report to the ED should he develop severe pain and worsening shortness of breath   LOS: 6 days    Ellwood Handler 06/28/2014

## 2014-06-28 NOTE — [facility] Oncology Navigator Note (Unsigned)
Called and spoke with pt nurse on 3300.  She stated pt just left.  I will call pt at home to give him appt time with Dr. Julien Nordmann.

## 2014-06-28 NOTE — Progress Notes (Signed)
Discharge instructions given. No questions or concerns at this time.

## 2014-06-28 NOTE — Progress Notes (Signed)
Pt d/c'd home via family vehicle.

## 2014-06-28 NOTE — Discharge Instructions (Signed)
Video-Assisted Thoracic Surgery °Care After °Refer to this sheet in the next few weeks. These instructions provide you with information on caring for yourself after your procedure. Your caregiver may also give you more specific instructions. Your procedure has been planned according to current medical practices, but problems sometimes occur. Call your caregiver if you have any problems or questions after your procedure. °HOME CARE INSTRUCTIONS  °· Only take over-the-counter or prescription medications as directed. °· Only take pain medications (narcotics) as directed. °· Do not drive until your caregiver approves. Driving while taking narcotics or soon after surgery can be dangerous, so discuss the specific timing with your caregiver. °· Avoid activities that use your chest muscles, such as lifting heavy objects, for at least 3-4 weeks.   °· Take deep breaths to expand the lungs and to protect against pneumonia. °· Do breathing exercises as directed by your caregiver. If you were given an incentive spirometer to help with breathing, use it as directed. °· You may resume a normal diet and activities when you feel you are able to or as directed. °· Do not take a bath until your caregiver says it is OK. Use the shower instead.   °· Keep the bandage (dressing) covering the area where the chest tube was inserted (incision site) dry for 48 hours. After 48 hours, remove the dressing unless there is new drainage. °· Remove dressings as directed by your caregiver. °· Change dressings if necessary or as directed. °· Keep all follow-up appointments. It is important for you to see your caregiver after surgery to discuss appropriate follow-up care and surveillance, if it is necessary. °SEEK MEDICAL CARE: °· You feel excessive or increasing pain at an incision site. °· You notice bleeding, skin irritation, drainage, swelling, or redness at an incision site. °· There is a bad smell coming from an incision or dressing. °· It feels  like your heart is fluttering or beating rapidly. °· Your pain medication does not relieve your pain. °SEEK IMMEDIATE MEDICAL CARE IF:  °· You have a fever.   °· You have chest pain.  °· You have a rash. °· You have shortness of breath. °· You have trouble breathing.   °· You feel weak, lightheaded, dizzy, or faint.   °MAKE SURE YOU:  °· Understand these instructions.   °· Will watch your condition.   °· Will get help right away if you are not doing well or get worse. °Document Released: 02/28/2013 Document Reviewed: 02/28/2013 °ExitCare® Patient Information ©2015 [employer]. This information is not intended to replace advice given to you by your health care provider. Make sure you discuss any questions you have with your health care provider. ° °Pneumothorax °A pneumothorax, commonly called a collapsed lung, is a condition in which air leaks from a lung and builds up in the space between the lung and the chest wall (pleural space). The air in a pneumothorax is trapped outside the lung and takes up space, preventing the lung from fully expanding. This is a condition that usually occurs suddenly. The buildup of air may be small or large. A small pneumothorax may go away on its own. When a pneumothorax is larger, it will often require medical treatment and hospitalization.  °CAUSES  °A pneumothorax can sometimes happen quickly with no apparent cause. People with underlying lung problems, particularly COPD or emphysema, are at higher risk of pneumothorax. However, pneumothorax can happen quickly even in people with no prior known lung problems. Trauma, surgery, medical procedures, or injury to the chest wall can also   cause a pneumothorax. °SIGNS AND SYMPTOMS  °Sometimes a pneumothorax will have no symptoms. When symptoms are present, they can include: °· Chest pain. °· Shortness of breath. °· Increased rate of breathing. °· Bluish color to your lips or skin (cyanosis). °DIAGNOSIS  °Pneumothorax is usually diagnosed  by a chest X-ray or chest CT scan. Your health care provider will also take a medical history and perform a physical exam to determine why you may have a pneumothorax. °TREATMENT  °A small pneumothorax may go away on its own without treatment. Extra oxygen can sometimes help a small pneumothorax go away more quickly. For a larger pneumothorax or a pneumothorax that is causing symptoms, a procedure is usually needed to drain the air. In some cases, the health care provider may drain the air using a needle. In other cases, a chest tube may be inserted into the pleural space. A chest tube is a small tube placed between the ribs and into the pleural space. This removes the extra air and allows the lung to expand back to its normal size. A large pneumothorax will usually require a hospital stay. If there is ongoing air leakage into the pleural space, then the chest tube may need to remain in place for several days until the air leak has healed. In some cases, surgery may be needed.  °HOME CARE INSTRUCTIONS  °· Only take over-the-counter or prescription medicines as directed by your health care provider. °· If a cough or pain makes it difficult for you to sleep at night, try sleeping in a semi-upright position in a recliner or by using 2 or 3 pillows. °· Rest and limit activity as directed by your health care provider. °· If you had a chest tube and it was removed, ask your health care provider when it is okay to remove the dressing. Until your health care provider says you can remove the dressing, do not allow it to get wet. °· Do not smoke. Smoking is a risk factor for pneumothorax. °· Do not fly in an airplane or scuba dive until your health care provider says it is okay. °· Follow up with your health care provider as directed. °SEEK IMMEDIATE MEDICAL CARE IF:  °· You have increasing chest pain or shortness of breath. °· You have a cough that is not controlled with suppressants. °· You begin coughing up blood. °· You  have pain that is getting worse or is not controlled with medicines. °· You cough up thick, discolored mucus (sputum) that is yellow to green in color. °· You have redness, increasing pain, or discharge at the site where a chest tube had been in place (if your pneumothorax was treated with a chest tube). °· The site where your chest tube was located opens up. °· You feel air coming out of the site where the chest tube was placed. °· You have a fever or persistent symptoms for more than 2-3 days. °· You have a fever and your symptoms suddenly get worse. °MAKE SURE YOU:  °· Understand these instructions. °· Will watch your condition. °· Will get help right away if you are not doing well or get worse. °Document Released: 11/03/2005 Document Revised: 08/24/2013 Document Reviewed: 06/02/2013 °ExitCare® Patient Information ©2015 [employer]. This information is not intended to replace advice given to you by your health care provider. Make sure you discuss any questions you have with your health care provider. ° ° °

## 2014-06-28 NOTE — Progress Notes (Signed)
Offered Pt. assistance with bath. Pt stated he will be leaving for home in a few minutes and will do bath once he gets home.

## 2014-06-29 ENCOUNTER — Telehealth: Payer: Self-pay | Admitting: Internal Medicine

## 2014-06-29 ENCOUNTER — Ambulatory Visit (HOSPITAL_BASED_OUTPATIENT_CLINIC_OR_DEPARTMENT_OTHER): Payer: BC Managed Care – PPO

## 2014-06-29 ENCOUNTER — Encounter: Payer: BC Managed Care – PPO | Admitting: *Deleted

## 2014-06-29 ENCOUNTER — Ambulatory Visit (HOSPITAL_BASED_OUTPATIENT_CLINIC_OR_DEPARTMENT_OTHER): Payer: BC Managed Care – PPO | Admitting: Internal Medicine

## 2014-06-29 ENCOUNTER — Ambulatory Visit: Payer: BC Managed Care – PPO | Attending: Internal Medicine | Admitting: Physical Therapy

## 2014-06-29 VITALS — BP 129/73 | HR 71 | Temp 98.1°F | Resp 19 | Ht 72.0 in | Wt 151.5 lb

## 2014-06-29 DIAGNOSIS — F17200 Nicotine dependence, unspecified, uncomplicated: Secondary | ICD-10-CM | POA: Diagnosis not present

## 2014-06-29 DIAGNOSIS — R0989 Other specified symptoms and signs involving the circulatory and respiratory systems: Secondary | ICD-10-CM

## 2014-06-29 DIAGNOSIS — R933 Abnormal findings on diagnostic imaging of other parts of digestive tract: Secondary | ICD-10-CM

## 2014-06-29 DIAGNOSIS — C782 Secondary malignant neoplasm of pleura: Secondary | ICD-10-CM

## 2014-06-29 DIAGNOSIS — R0602 Shortness of breath: Secondary | ICD-10-CM | POA: Diagnosis not present

## 2014-06-29 DIAGNOSIS — R293 Abnormal posture: Secondary | ICD-10-CM | POA: Insufficient documentation

## 2014-06-29 DIAGNOSIS — C801 Malignant (primary) neoplasm, unspecified: Secondary | ICD-10-CM

## 2014-06-29 DIAGNOSIS — C3490 Malignant neoplasm of unspecified part of unspecified bronchus or lung: Secondary | ICD-10-CM | POA: Insufficient documentation

## 2014-06-29 DIAGNOSIS — C7801 Secondary malignant neoplasm of right lung: Secondary | ICD-10-CM

## 2014-06-29 DIAGNOSIS — IMO0001 Reserved for inherently not codable concepts without codable children: Secondary | ICD-10-CM | POA: Diagnosis present

## 2014-06-29 DIAGNOSIS — R0609 Other forms of dyspnea: Secondary | ICD-10-CM

## 2014-06-29 DIAGNOSIS — I10 Essential (primary) hypertension: Secondary | ICD-10-CM | POA: Insufficient documentation

## 2014-06-29 DIAGNOSIS — R059 Cough, unspecified: Secondary | ICD-10-CM

## 2014-06-29 DIAGNOSIS — R05 Cough: Secondary | ICD-10-CM

## 2014-06-29 LAB — CBC WITH DIFFERENTIAL/PLATELET
BASO%: 1 % (ref 0.0–2.0)
Basophils Absolute: 0.1 10*3/uL (ref 0.0–0.1)
EOS%: 4 % (ref 0.0–7.0)
Eosinophils Absolute: 0.3 10*3/uL (ref 0.0–0.5)
HEMATOCRIT: 46.5 % (ref 38.4–49.9)
HGB: 15.5 g/dL (ref 13.0–17.1)
LYMPH%: 21.2 % (ref 14.0–49.0)
MCH: 32.8 pg (ref 27.2–33.4)
MCHC: 33.4 g/dL (ref 32.0–36.0)
MCV: 98.4 fL — ABNORMAL HIGH (ref 79.3–98.0)
MONO#: 1 10*3/uL — ABNORMAL HIGH (ref 0.1–0.9)
MONO%: 11.6 % (ref 0.0–14.0)
NEUT#: 5.2 10*3/uL (ref 1.5–6.5)
NEUT%: 62.2 % (ref 39.0–75.0)
Platelets: 247 10*3/uL (ref 140–400)
RBC: 4.73 10*6/uL (ref 4.20–5.82)
RDW: 14.3 % (ref 11.0–14.6)
WBC: 8.3 10*3/uL (ref 4.0–10.3)
lymph#: 1.8 10*3/uL (ref 0.9–3.3)

## 2014-06-29 LAB — COMPREHENSIVE METABOLIC PANEL (CC13)
ALBUMIN: 3 g/dL — AB (ref 3.5–5.0)
ALT: 21 U/L (ref 0–55)
ANION GAP: 8 meq/L (ref 3–11)
AST: 21 U/L (ref 5–34)
Alkaline Phosphatase: 60 U/L (ref 40–150)
BUN: 14.4 mg/dL (ref 7.0–26.0)
CO2: 29 meq/L (ref 22–29)
CREATININE: 0.7 mg/dL (ref 0.7–1.3)
Calcium: 9.2 mg/dL (ref 8.4–10.4)
Chloride: 104 mEq/L (ref 98–109)
Glucose: 102 mg/dl (ref 70–140)
Potassium: 3.8 mEq/L (ref 3.5–5.1)
SODIUM: 141 meq/L (ref 136–145)
TOTAL PROTEIN: 6 g/dL — AB (ref 6.4–8.3)
Total Bilirubin: 0.75 mg/dL (ref 0.20–1.20)

## 2014-06-29 NOTE — Patient Instructions (Signed)
Smoking Cessation Quitting smoking is important to your health and has many advantages. However, it is not always easy to quit since nicotine is a very addictive drug. Oftentimes, people try 3 times or more before being able to quit. This document explains the best ways for you to prepare to quit smoking. Quitting takes hard work and a lot of effort, but you can do it. ADVANTAGES OF QUITTING SMOKING  You will live longer, feel better, and live better.  Your body will feel the impact of quitting smoking almost immediately.  Within 20 minutes, blood pressure decreases. Your pulse returns to its normal level.  After 8 hours, carbon monoxide levels in the blood return to normal. Your oxygen level increases.  After 24 hours, the chance of having a heart attack starts to decrease. Your breath, hair, and body stop smelling like smoke.  After 48 hours, damaged nerve endings begin to recover. Your sense of taste and smell improve.  After 72 hours, the body is virtually free of nicotine. Your bronchial tubes relax and breathing becomes easier.  After 2 to 12 weeks, lungs can hold more air. Exercise becomes easier and circulation improves.  The risk of having a heart attack, stroke, cancer, or lung disease is greatly reduced.  After 1 year, the risk of coronary heart disease is cut in half.  After 5 years, the risk of stroke falls to the same as a nonsmoker.  After 10 years, the risk of lung cancer is cut in half and the risk of other cancers decreases significantly.  After 15 years, the risk of coronary heart disease drops, usually to the level of a nonsmoker.  If you are pregnant, quitting smoking will improve your chances of having a healthy baby.  The people you live with, especially any children, will be healthier.  You will have extra money to spend on things other than cigarettes. QUESTIONS TO THINK ABOUT BEFORE ATTEMPTING TO QUIT You may want to talk about your answers with your  health care provider.  Why do you want to quit?  If you tried to quit in the past, what helped and what did not?  What will be the most difficult situations for you after you quit? How will you plan to handle them?  Who can help you through the tough times? Your family? Friends? A health care provider?  What pleasures do you get from smoking? What ways can you still get pleasure if you quit? Here are some questions to ask your health care provider:  How can you help me to be successful at quitting?  What medicine do you think would be best for me and how should I take it?  What should I do if I need more help?  What is smoking withdrawal like? How can I get information on withdrawal? GET READY  Set a quit date.  Change your environment by getting rid of all cigarettes, ashtrays, matches, and lighters in your home, car, or work. Do not let people smoke in your home.  Review your past attempts to quit. Think about what worked and what did not. GET SUPPORT AND ENCOURAGEMENT You have a better chance of being successful if you have help. You can get support in many ways.  Tell your family, friends, and coworkers that you are going to quit and need their support. Ask them not to smoke around you.  Get individual, group, or telephone counseling and support. Programs are available at local hospitals and health centers. Call   your local health department for information about programs in your area.  Spiritual beliefs and practices may help some smokers quit.  Download a "quit meter" on your computer to keep track of quit statistics, such as how long you have gone without smoking, cigarettes not smoked, and money saved.  Get a self-help book about quitting smoking and staying off tobacco. LEARN NEW SKILLS AND BEHAVIORS  Distract yourself from urges to smoke. Talk to someone, go for a walk, or occupy your time with a task.  Change your normal routine. Take a different route to work.  Drink tea instead of coffee. Eat breakfast in a different place.  Reduce your stress. Take a hot bath, exercise, or read a book.  Plan something enjoyable to do every day. Reward yourself for not smoking.  Explore interactive web-based programs that specialize in helping you quit. GET MEDICINE AND USE IT CORRECTLY Medicines can help you stop smoking and decrease the urge to smoke. Combining medicine with the above behavioral methods and support can greatly increase your chances of successfully quitting smoking.  Nicotine replacement therapy helps deliver nicotine to your body without the negative effects and risks of smoking. Nicotine replacement therapy includes nicotine gum, lozenges, inhalers, nasal sprays, and skin patches. Some may be available over-the-counter and others require a prescription.  Antidepressant medicine helps people abstain from smoking, but how this works is unknown. This medicine is available by prescription.  Nicotinic receptor partial agonist medicine simulates the effect of nicotine in your brain. This medicine is available by prescription. Ask your health care provider for advice about which medicines to use and how to use them based on your health history. Your health care provider will tell you what side effects to look out for if you choose to be on a medicine or therapy. Carefully read the information on the package. Do not use any other product containing nicotine while using a nicotine replacement product.  RELAPSE OR DIFFICULT SITUATIONS Most relapses occur within the first 3 months after quitting. Do not be discouraged if you start smoking again. Remember, most people try several times before finally quitting. You may have symptoms of withdrawal because your body is used to nicotine. You may crave cigarettes, be irritable, feel very hungry, cough often, get headaches, or have difficulty concentrating. The withdrawal symptoms are only temporary. They are strongest  when you first quit, but they will go away within 10-14 days. To reduce the chances of relapse, try to:  Avoid drinking alcohol. Drinking lowers your chances of successfully quitting.  Reduce the amount of caffeine you consume. Once you quit smoking, the amount of caffeine in your body increases and can give you symptoms, such as a rapid heartbeat, sweating, and anxiety.  Avoid smokers because they can make you want to smoke.  Do not let weight gain distract you. Many smokers will gain weight when they quit, usually less than 10 pounds. Eat a healthy diet and stay active. You can always lose the weight gained after you quit.  Find ways to improve your mood other than smoking. FOR MORE INFORMATION  www.smokefree.gov  Document Released: 10/28/2001 Document Revised: 03/20/2014 Document Reviewed: 02/12/2012 Exit[employer] Patient Information 2015 [employer]. This information is not intended to replace advice given to you by your health care provider. Make sure you discuss any questions you have with your health care provider.  

## 2014-06-29 NOTE — Telephone Encounter (Signed)
pt sent back to lab and given appt schedule for aug and appt w/dr brodie 9/8. per esmeralda @ [facility] pt see dr Olevia Perches. per pt this was not to be for a visit but for a procedure. Referral not clear that this appt should be for a procedure. message to MM to clarify referral . pt aware i will contact him when i have MM's response.

## 2014-06-29 NOTE — Progress Notes (Signed)
[place] Clinical Social Work  Clinical Social Work met with patient/family and Futures trader at [facility] appointment to offer support and assess for psychosocial needs.  Medical oncologist reviewed patient's diagnosis and need for PET/MRI/molecular testing with patient/family. The patient was accompanied by his son.  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   Patient reports no concerns with discussed plan.  Clinical Social Work briefly discussed Clinical Social Work role and [employer] support programs/services.  Clinical Social Work encouraged patient to call with any additional questions or concerns.   Polo Riley, MSW, LCSW, OSW-C Clinical Social Worker [facility] [phone]

## 2014-06-29 NOTE — Progress Notes (Signed)
[place] [phone]   [phone] Multidisciplinary thoracic oncology clinic ([place])  CONSULT NOTE  REFERRING PHYSICIAN: Dr. Modesto Charon.  REASON FOR CONSULTATION:  63 years old white male recently diagnosed with metastatic adenocarcinoma of unknown primary.  HPI Henry [name] is a 63 y.o. male with a past medical history significant for hypertension, hemochromatosis, gastritis as well as history of colon polyps and long history of smoking. The patient mentions that over the last few weeks he has been complaining of few episodes of pneumonia as well as shortness of breath and pain on the right side of his chest and upper back. He was seen by his primary care physician and chest x-ray was performed on 06/15/2014 and it showed new large right pleural effusion with associated atelectasis occupying approximately half of the right hemithorax. He was referred to Dr. Roxan Hockey and underwent right-sided thoracentesis on 06/16/2014 with drainage of 2 L of pleural fluid. Chest x-ray of to the right thoracentesis showed 25% right pneumothorax. The patient was admitted to [facility]. CT scan of the chest without contrast on 08/05/2015showed moderately right hydropneumothorax with bearing degrees within the right pleural collection, hypodense possibly reflecting hemorrhage. There was subpleural opacities/nodularities, right lung predominant, favored to reflect endobronchial spread of infection. The there was also compressive atelectasis in the right lower lobe. On 06/23/2014 the patient underwent right video-assisted thoracoscopy, drainage of pleural effusion, pleural biopsy, partial decortication, talc pleurodesis.under the care of Dr. Roxan Hockey.The final pathology of the biopsies of the right parietal pleura (Accession: [id number]) showed metastatic adenocarcinoma. Both of the specimens are involved by well-differentiated adenocarcinoma with mucinous  features consistent with metastatic adenocarcinoma. Possible primary sites include lung, pancreatobiliary and upper gastrointestinal tract. Immunohistochemistry is performed and the adenocarcinoma is strongly positive with cytokeratin 7 and cytokeratin 20 and shows patchy, partial positivity with CDX2. The tumor is negative withNapsin A, thyroid transcription factor-1, prostate specific antigen and prostein. The immunophenotype, while nonspecific, suggests a differential including pancreatobiliary and gastrointestinal. This immunophenotype is unusual in primary lung adenocarcinomas. Dr. Roxan Hockey kindly referred the patient to me today for evaluation and discussion of his treatment options. Of note that last year the patient underwent upper endoscopy with endoscopic ultrasound as well as colonoscopy under the care of Dr. Ardis Hughs. The endoscopic ultrasound showed questionable chronic inflammation in the pancreatic. When seen today the patient continues to have soreness on the right side of the chest as well as mild cough but no hemoptysis. He also has shortness of breath with exertion but much improved after the talc pleurodesis. He lost around 60 pound over the last 2 years. The patient has history of gastritis but no dysphagia or odynophagia. He denied having any significant nausea or vomiting, no fever or chills. The patient denied having any headache or burry vision. His family history significant for her mother and father with hypertension but his mother had a stroke one still alive but his father is also still alive. He has a sister with bladder cancer. The patient is a widow and has 2 sons. He is self-employed and worked in Nurse, learning disability. He has a history of smoking one half pack per day for around 43 years and unfortunately continues to smoke and I strongly encouraged him to quit smoking for gamma smoke cessation program. He also has 2-3 alcoholic drinks every day and no history of drug  abuse.  HPI  Past Medical History  Diagnosis Date  . Hypertension   . GERD (gastroesophageal  reflux disease)   . Hemochromatosis   . Hx of adenomatous colonic polyps   . Umbilical hernia   . Diverticulosis   . Lumbar spondylosis     ankle  . Pleural effusion on right 12/16/13    per chest xray  . Shortness of breath     with  exertion    Past Surgical History  Procedure Laterality Date  . Lumbar laminectomy  1983  . Knee arthrsoscopy  2003    left  . Tonsillectomy  1962  . Eus N/A 04/07/2013    Procedure: UPPER ENDOSCOPIC ULTRASOUND (EUS) LINEAR;  Surgeon: [name], MD;  Location: [facility];  Service: Endoscopy;  Laterality: N/A;  . Video assisted thoracoscopy (vats)/decortication Right 06/22/2014    Procedure: VIDEO ASSISTED THORACOSCOPY (VATS)/DECORTICATION;  Surgeon: [name], MD;  Location: [place];  Service: Thoracic;  Laterality: Right;  . Pleural effusion drainage Right 06/22/2014    Procedure: DRAINAGE OF PLEURAL EFFUSION;  Surgeon: [name], MD;  Location: [place];  Service: Thoracic;  Laterality: Right;    Family History  Problem Relation Age of Onset  . Colon cancer Maternal Grandmother 66  . Stomach cancer Paternal Grandfather 52  . Hypertension Mother   . Hypertension Father   . Hypertension Sister     Social History History  Substance Use Topics  . Smoking status: Current Every Day Smoker -- 2.00 packs/day for 40 years    Types: Cigarettes  . Smokeless tobacco: Never Used  . Alcohol Use: 12.6 oz/week    21 Cans of beer per week     Comment: Daily 1-2 beers    No Known Allergies  Current Outpatient Prescriptions  Medication Sig Dispense Refill  . albuterol (PROVENTIL HFA;VENTOLIN HFA) 108 (90 BASE) MCG/ACT inhaler Inhale 1-2 puffs into the lungs every 4 (four) hours as needed for wheezing or shortness of breath.       . ciprofloxacin (CIPRO) 500 MG tablet Take 500 mg by mouth 2 (two) times daily.      Marland Kitchen diltiazem  (CARDIZEM CD) 360 MG 24 hr capsule Take 360 mg by mouth every morning.       . hydrochlorothiazide (HYDRODIURIL) 25 MG tablet Take 25 mg by mouth daily.       . mupirocin ointment (BACTROBAN) 2 %       . nebivolol (BYSTOLIC) 5 MG tablet Take 5 mg by mouth daily.      Marland Kitchen oxyCODONE (OXY IR/ROXICODONE) 5 MG immediate release tablet Take 1-2 tablets (5-10 mg total) by mouth every 4 (four) hours as needed for severe pain.  30 tablet  0  . potassium chloride SA (K-DUR,KLOR-CON) 20 MEQ tablet Take 40 mEq by mouth daily.       . Probiotic Product (ALIGN) 4 MG CAPS Take 4 mg by mouth daily.       . VOLTAREN 1 % GEL Apply 2 g topically 3 (three) times a week.        No current facility-administered medications for this visit.    Review of Systems  Constitutional: positive for fatigue and weight loss Eyes: negative Ears, nose, mouth, throat, and face: negative Respiratory: positive for cough, dyspnea on exertion and pleurisy/chest pain Cardiovascular: negative Gastrointestinal: positive for dyspepsia Genitourinary:negative Integument/breast: negative Hematologic/lymphatic: negative Musculoskeletal:negative Neurological: negative Behavioral/Psych: negative Endocrine: negative Allergic/Immunologic: negative  Physical Exam  QIO:NGEXB, healthy, no distress, well nourished, well developed and anxious SKIN: skin color, texture, turgor are normal, no rashes or significant lesions HEAD: Normocephalic,  No masses, lesions, tenderness or abnormalities EYES: normal, PERRLA EARS: External ears normal, Canals clear OROPHARYNX:no exudate, no erythema and lips, buccal mucosa, and tongue normal  NECK: supple, no adenopathy, no JVD LYMPH:  no palpable lymphadenopathy, no hepatosplenomegaly LUNGS: decreased breath sounds, expiratory wheezes bilaterally HEART: regular rate & rhythm, no murmurs and no gallops ABDOMEN:abdomen soft, non-tender, normal bowel sounds and no masses or organomegaly BACK: Back  symmetric, no curvature., No CVA tenderness EXTREMITIES:no joint deformities, effusion, or inflammation, no edema, no skin discoloration  NEURO: alert & oriented x 3 with fluent speech, no focal motor/sensory deficits  PERFORMANCE STATUS: ECOG 1  LABORATORY DATA: Lab Results  Component Value Date   WBC 13.0* 06/24/2014   HGB 16.0 06/24/2014   HCT 45.3 06/24/2014   MCV 96.2 06/24/2014   PLT 228 06/24/2014      Chemistry      Component Value Date/Time   NA 140 06/24/2014 0346   K 3.3* 06/24/2014 0346   CL 102 06/24/2014 0346   CO2 28 06/24/2014 0346   BUN 11 06/24/2014 0346   CREATININE 0.75 06/24/2014 0346      Component Value Date/Time   CALCIUM 8.6 06/24/2014 0346   ALKPHOS 54 06/24/2014 0346   AST 13 06/24/2014 0346   ALT 11 06/24/2014 0346   BILITOT 0.6 06/24/2014 0346       RADIOGRAPHIC STUDIES: Dg Chest 2 View  06/28/2014   CLINICAL DATA:  Pneumothorax  EXAM: CHEST  2 VIEW  COMPARISON:  June 27, 2014  FINDINGS: The pneumothorax on the right is again noted. The apical component appears slightly smaller. The basilar component appears stable. There is extensive subcutaneous air on the right. There is no appreciable tension component. There is a small effusion on the right. There is patchy infiltrate in the right upper and lower lobes. On the left, the interstitium remains prominent with areas of patchy atelectatic change, stable. No new opacity is identified. The heart size is normal. Pulmonary vascularity is within normal limits. Central catheter tip is in the superior vena cava near the cavoatrial junction.  IMPRESSION: Pneumothorax again noted on the right, perhaps slightly smaller in the apical component and stable in the basilar component. There is a small effusion on the right as well as areas of patchy infiltrate in the right lung, essentially stable. Extensive subcutaneous air on the right is stable. Interstitial prominence with patchy atelectasis on the left is stable. No change in cardiac  silhouette.   Electronically Signed   By: Lowella Grip M.D.   On: 06/28/2014 08:15   Dg Chest 2 View  06/25/2014   CLINICAL DATA:  63 year old male status post right VATS.  Follow-up.  EXAM: CHEST  2 VIEW  COMPARISON:  06/24/2014 and prior chest radiographs  FINDINGS: Cardiomediastinal silhouette is stable.  A 5% right apical pneumothorax and small right basilar pneumothorax are unchanged.  Right thoracic postoperative changes and right thoracostomy tubes again noted.  Right subcutaneous emphysema has minimally increased.  A right IJ central venous catheter is present with tip overlying the superior cavoatrial junction.  Right lower lung atelectasis is unchanged.  No large effusions identified.  IMPRESSION: Unchanged small right apical and right basilar pneumothorax.  Slightly increased right subcutaneous emphysema.  No other significant change identified.   Electronically Signed   By: Hassan Rowan M.D.   On: 06/25/2014 07:38   Chest 2 View  06/22/2014   CLINICAL DATA:  Hydropneumothorax  EXAM: CHEST  2 VIEW  COMPARISON:  Chest CT June 21, 2014  FINDINGS: There is a persistent hydro pneumothorax on the right. The pneumothorax portion appears slightly smaller than 1 day prior. There is no appreciable tension component.  There is underlying emphysematous change. There are scattered areas of lung scarring bilaterally. There is no edema or consolidation on the left. Small nodular opacities on both sides of appears stable compared to CT study 1 day prior. Heart size is normal. Pulmonary vascularity reflects underlying emphysema. No adenopathy.  IMPRESSION: Persistent hydropneumothorax in the right, slightly smaller than 1 day prior. No tension component. Underlying emphysema. Multiple small nodular opacities appear stable.   Electronically Signed   By: Lowella Grip M.D.   On: 06/22/2014 12:58   Dg Chest 2 View  06/19/2014   CLINICAL DATA:  Cough. History of right pleural effusion and thoracentesis  06/16/2014.  EXAM: CHEST  2 VIEW  COMPARISON:  Radiographs 11/24/2013, 06/15/2014 and 06/16/2014.  FINDINGS: There is a persistent right-sided hydro pneumothorax status post recent right-sided thoracentesis. Compared with the study performed 3 days ago, there is slightly increased air and fluid within the right pleural space. Underlying right basilar pulmonary opacity and partial right lung collapse are stable. There is no mediastinal shift. The left lung is clear. The visualized heart size and mediastinal contours are stable.  IMPRESSION: Slight enlargement of right-sided hydro pneumothorax following recent thoracentesis. No associated mediastinal shift.   Electronically Signed   By: Camie Patience M.D.   On: 06/19/2014 14:32   Dg Chest 2 View  06/16/2014   CLINICAL DATA:  Post thoracentesis  EXAM: CHEST  2 VIEW  COMPARISON:  06/15/2014  FINDINGS: Significant decrease in right pleural effusion. Small pleural effusion remains. Patient developed a small moderate right basilar pneumothorax extending to the apex. Estimated 25% right pneumothorax. There is right lower lobe atelectasis.  Left lung remains clear.  IMPRESSION: 25% right pneumothorax post thoracentesis.  These results will be called to the ordering clinician or representative by the Radiologist Assistant, and communication documented in the PACS or zVision Dashboard.   Electronically Signed   By: Franchot Gallo M.D.   On: 06/16/2014 16:52   Dg Chest 2 View  06/15/2014   CLINICAL DATA:  Chest pain and cough.  Shortness of breath.  EXAM: CHEST  2 VIEW  COMPARISON:  11/24/2013  FINDINGS: Examination demonstrates opacification over the lower half of the right hemithorax likely a large effusion with associated atelectasis. Left lung is clear. Cardiac silhouette is within normal. There is mild calcified plaque over the thoracic aorta. Remainder of the exam is unchanged.  IMPRESSION: New large right pleural effusion likely with associated atelectasis occupying  approximately 1/2 the right hemithorax.   Electronically Signed   By: Marin Olp M.D.   On: 06/15/2014 13:13   Ct Chest Wo Contrast  06/21/2014   CLINICAL DATA:  Preop right hydropneumothorax  EXAM: CT CHEST WITHOUT CONTRAST  TECHNIQUE: Multidetector CT imaging of the chest was performed following the standard protocol without IV contrast.  COMPARISON:  Chest radiograph dated 06/19/2014. Partial comparison to CT abdomen pelvis dated 02/04/2013.  FINDINGS: Moderate right hydropneumothorax. Layering debris within the right pleural collection, hyperdense, possibly reflecting hemorrhage.  Subpleural opacities/nodularity, right lung predominant, favored to reflect endobronchial spread of infection. When correlating with prior CT abdomen pelvis, the appearance of the lung bases are new.  Compressive atelectasis in the right lower lobe.  Visualized thyroid is unremarkable.  The heart is normal in size. No pericardial effusion. Coronary atherosclerosis. Atherosclerotic  calcifications of the aortic arch.  Small mediastinal lymph nodes, which do not meet pathologic CT size criteria.  Visualized upper abdomen is notable for small hepatic cysts.  Degenerative changes of the visualized thoracolumbar spine.  IMPRESSION: Moderate right hydro pneumothorax with suspected layering debris/hemorrhage.  Underlying multifocal subpleural opacity/nodularity, right lung predominant, favored to reflect endobronchial spread of infection. Consider follow-up CT chest in 4-6 weeks to document resolution.  Underlying compressive atelectasis in the right lower lobe.   Electronically Signed   By: Julian Hy M.D.   On: 06/21/2014 15:58   Dg Chest Port 1 View  06/27/2014   CLINICAL DATA:  Evaluate pneumothorax  EXAM: PORTABLE CHEST - 1 VIEW  COMPARISON:  06/27/2014  FINDINGS: Persistent right-sided pneumothorax is noted. It appears slightly more prominent when compared with the prior exam. Persistent patchy changes are noted throughout  both lungs but worse on the right. A right-sided jugular line is again seen. The cardiac shadow is stable.  IMPRESSION: Slightly more prominent right pneumothorax when compared with the earlier film   Electronically Signed   By: Inez Catalina M.D.   On: 06/27/2014 15:25   Dg Chest Port 1 View  06/27/2014   CLINICAL DATA:  Chest tube removal.  EXAM: PORTABLE CHEST - 1 VIEW  COMPARISON:  06/27/2014 at 6:09 a.m.  FINDINGS: Right IJ central venous catheter has been advanced slightly as tip is over the cavoatrial junction. There has been interval removal of the right basilar chest tube.  There is evidence of a small right pneumothorax unchanged over the apex but slightly more visible in the right base. There is mild opacification in the right base likely atelectasis, although cannot exclude infection. There is minimal prominence of the perihilar markings as cannot exclude a mild degree of facet congestion. Cardiomediastinal silhouette and remainder the exam is unchanged including subcutaneous emphysema over the right flank.  IMPRESSION: Small right-sided pneumothorax post right basilar chest tube removal. Pneumothorax is unchanged overall although more visible on the right base.  Mild opacification in the right base likely atelectasis, although cannot exclude infection. Suggestion of minimal vascular congestion.  Slight advancement of the right IJ central venous catheter with tip over the cavoatrial junction.   Electronically Signed   By: Marin Olp M.D.   On: 06/27/2014 10:25   Dg Chest Port 1 View  06/27/2014   CLINICAL DATA:  Pneumothorax  EXAM: PORTABLE CHEST - 1 VIEW  COMPARISON:  06/26/1949  FINDINGS: Cardiac shadow is stable. A a right-sided central venous line is again seen. Small right-sided pneumothorax is again noted. There is been interval removal of 1 of the 2 residual chest tubes. Persistent right basilar infiltrate is seen. The left lung remains clear.  IMPRESSION: Interval removal of one right  chest tube. One right chest tube remains in the base. The right-sided pneumothorax is stable from the prior exam. Remainder of the study is stable.   Electronically Signed   By: Inez Catalina M.D.   On: 06/27/2014 07:55   Dg Chest Port 1 View  06/26/2014   CLINICAL DATA:  Check chest tube  EXAM: PORTABLE CHEST - 1 VIEW  COMPARISON:  06/25/2014  FINDINGS: Cardiac shadow is stable. The left lung remains clear. A right-sided central venous line is again noted. Two chest tubes are noted on the right. A tiny right pneumothorax remains. Early infiltrate is noted in the right lung base.  IMPRESSION: Persistent small right-sided pneumothorax following chest tube removal  Early right basilar infiltrate.  Electronically Signed   By: Inez Catalina M.D.   On: 06/26/2014 08:00   Dg Chest Port 1 View  06/23/2014   CLINICAL DATA:  Evaluate pneumothorax  EXAM: PORTABLE CHEST - 1 VIEW  COMPARISON:  Prior chest x-ray 06/22/2014  FINDINGS: No definite residual apical pneumothorax. There is a tiny basilar pneumothorax in the right costophrenic angle. Chest tubes remain in good position. There are 3. Right IJ central venous catheter with the tip at the superior cavoatrial junction. Slightly decreased subcutaneous emphysema along the right lateral chest wall. Persistent right basilar airspace opacity consistent with a combination of atelectasis and infiltrate. Lower inspiratory volumes. Slightly increased subsegmental atelectasis in the left base and mid lung. a. Stable cardiac and mediastinal contours. Aortic atherosclerosis. No acute osseous abnormality.  IMPRESSION: 1. Persistent small basilar pneumothorax in the right costophrenic angle. 2. Interval resolution of right apical pneumothorax. 3. Stable and satisfactory support apparatus. 4. Slightly lower inspiratory volumes with increased subsegmental atelectasis.   Electronically Signed   By: Jacqulynn Cadet M.D.   On: 06/23/2014 07:40   Dg Chest Port 1 View  06/22/2014    CLINICAL DATA:  Recent hydropneumothorax  EXAM: PORTABLE CHEST - 1 VIEW  COMPARISON:  Study obtained earlier in the day  FINDINGS: There are now 2 chest tubes on the right. There is currently a minimal right apical pneumothorax. There is moderate soft tissue air on the right. There has been essentially complete resolution of effusion on the right. There is patchy atelectasis in the right base.  The lungs are elsewhere clear except for the small nodular lesions noted previously. The heart size and pulmonary vascularity are normal. There is no a central catheter with the tip in the superior vena cava.  IMPRESSION: Only minimal pneumothorax remains in the right apex following chest tube placements on the right. There is right base atelectasis but no appreciable effusion. There is moderate subcutaneous air on the right. Central catheter tip in superior vena cava. Small nodular opacities remain bilaterally, unchanged.   Electronically Signed   By: Lowella Grip M.D.   On: 06/22/2014 17:01   Dg Chest Port 1v Same Day  06/24/2014   CLINICAL DATA:  Status post VATS with drainage of right pleural effusion and pleurodesis.  EXAM: PORTABLE CHEST - 1 VIEW SAME DAY  COMPARISON:  06/23/2014  FINDINGS: Right chest tube shows stable positioning. There is a 5% apical pneumothorax on the right. There is some increase in subcutaneous emphysema within the chest wall. Mild atelectasis remains in the right lower lung. No pulmonary edema. No significant pleural fluid. The heart size is normal.  IMPRESSION: 5% right apical pneumothorax.   Electronically Signed   By: Aletta Edouard M.D.   On: 06/24/2014 11:01    ASSESSMENT: This is a very pleasant 63 years old white male recently diagnosed with metastatic adenocarcinoma of unknown primary but questionable for upper gastrointestinal, pancreaticobiliary or primary lung cancer.   PLAN: I had a lengthy discussion with the patient and his son today about his current disease is  stage, prognosis and treatment options. The patient understands that he has metastatic disease that is incurable and all the treatment would be of palliative nature. 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 We will complete the staging workup by ordering a PET scan as well as MRI  of the brain. His PET scan is scheduled for tomorrow and MRI early next week. I would also referred the patient to see Dr. Ardis Hughs for evaluation and consideration of upper endoscopy with endoscopic ultrasound and evaluation of the pancreatic area. We also requested his tumor block to be sent to [facility] one for molecular by marker testing. I would also ordered tumor markers including CA 19.9 as well as CEA. His symptoms have significantly improved after the talc pleurodesis. I gave the patient the option of starting systemic chemotherapy with a broad-spectrum regimen like carboplatin and paclitaxel or cisplatin and gemcitabine versus waiting to complete the workup and evaluating the primary tumor and molecular studies. The patient would like to wait until completion of the workup but he knows to call me immediately if he becomes more symptomatic to start some form of treatment. I would see him back for followup visit in 2 weeks for reevaluation and more detailed discussion of his treatment options based on the imaging and molecular studies. For smoke cessation, I strongly encouraged the patient to quit smoking and offered him to smoke cessation program. He is planning to quit on his own. The patient was advised to call immediately if he has any concerning symptoms in the interval. The patient voices understanding of current disease status and treatment options and is in agreement with the current care plan.  All questions  were answered. The patient knows to call the clinic with any problems, questions or concerns. We can certainly see the patient much sooner if necessary.  Thank you so much for allowing me to participate in the care of Henry [name]. I will continue to follow up the patient with you and assist in his care.  I spent 45 minutes counseling the patient face to face. The total time spent in the appointment was 65 minutes.  Disclaimer: This note was dictated with voice recognition software. Similar sounding words can inadvertently be transcribed and may not be corrected upon review.   Drevion Offord K. 06/29/2014, 2:44 PM

## 2014-06-30 ENCOUNTER — Ambulatory Visit (HOSPITAL_COMMUNITY)
Admission: RE | Admit: 2014-06-30 | Discharge: 2014-06-30 | Disposition: A | Discharge status: Home / Self Care | Payer: BC Managed Care – PPO | Source: Ambulatory Visit | Attending: Diagnostic Radiology | Admitting: Diagnostic Radiology

## 2014-06-30 ENCOUNTER — Telehealth: Payer: Self-pay | Admitting: Internal Medicine

## 2014-06-30 DIAGNOSIS — C7800 Secondary malignant neoplasm of unspecified lung: Secondary | ICD-10-CM | POA: Diagnosis not present

## 2014-06-30 DIAGNOSIS — J910 Malignant pleural effusion: Secondary | ICD-10-CM | POA: Insufficient documentation

## 2014-06-30 DIAGNOSIS — J984 Other disorders of lung: Secondary | ICD-10-CM | POA: Insufficient documentation

## 2014-06-30 DIAGNOSIS — E041 Nontoxic single thyroid nodule: Secondary | ICD-10-CM | POA: Insufficient documentation

## 2014-06-30 LAB — CANCER ANTIGEN 19-9: CA 19-9: 574.2 U/mL — ABNORMAL HIGH (ref <35.0)

## 2014-06-30 LAB — CEA: CEA: 2.8 ng/mL (ref 0.0–5.0)

## 2014-06-30 LAB — GLUCOSE, CAPILLARY: Glucose-Capillary: 95 mg/dL (ref 70–99)

## 2014-06-30 MED ORDER — FLUDEOXYGLUCOSE F - 18 (FDG) INJECTION
7.4400 | Freq: Once | INTRAVENOUS | Status: AC | PRN
  Administered 2014-06-30: 7.44 via INTRAVENOUS

## 2014-06-30 NOTE — Telephone Encounter (Signed)
PER RESPONSE FROM MM PT NEEDS EUS. S/W ESMERALDA AT [facility] RE REQUEST FOR EUS NOT F/U APPT. PER ESMERALDA LEAVE MESSAGE FOR PATTY LEWIS DR JACOBS ASSITANT. LMONVM FOR PATTY RE REQUEST FOR EUS AND CX APPT FOR F/U W/DR [facility]. ASKED THAT OFFICE CONTACT PT RE PROCEDUER. ALSO EMAILED PATTY VIA EPIC INCLUDING MM'S RESPONSE. S/W PT HE IS AWARE. PATTY TO CONTACT SHAMEEKA D W/ANY QUESTION AS I WILL BE OUT ON [facility] UNTIL 07/11/14.

## 2014-07-03 ENCOUNTER — Encounter (INDEPENDENT_AMBULATORY_CARE_PROVIDER_SITE_OTHER): Payer: BC Managed Care – PPO

## 2014-07-03 DIAGNOSIS — C3490 Malignant neoplasm of unspecified part of unspecified bronchus or lung: Secondary | ICD-10-CM

## 2014-07-04 ENCOUNTER — Telehealth: Payer: Self-pay

## 2014-07-04 ENCOUNTER — Ambulatory Visit (HOSPITAL_COMMUNITY)
Admission: RE | Admit: 2014-07-04 | Discharge: 2014-07-04 | Disposition: A | Discharge status: Home / Self Care | Payer: BC Managed Care – PPO | Source: Ambulatory Visit | Attending: Thoracic Surgery (Cardiothoracic Vascular Surgery) | Admitting: Thoracic Surgery (Cardiothoracic Vascular Surgery)

## 2014-07-04 ENCOUNTER — Other Ambulatory Visit: Payer: Self-pay

## 2014-07-04 DIAGNOSIS — C801 Malignant (primary) neoplasm, unspecified: Secondary | ICD-10-CM | POA: Diagnosis not present

## 2014-07-04 DIAGNOSIS — R932 Abnormal findings on diagnostic imaging of liver and biliary tract: Secondary | ICD-10-CM

## 2014-07-04 MED ORDER — GADOBENATE DIMEGLUMINE 529 MG/ML IV SOLN
15.0000 mL | Freq: Once | INTRAVENOUS | Status: AC | PRN
  Administered 2014-07-04: 15 mL via INTRAVENOUS

## 2014-07-04 NOTE — Telephone Encounter (Signed)
EUS scheduled, pt instructed and medications reviewed.  Patient instructions mailed to home.  Patient to call with any questions or concerns.  

## 2014-07-04 NOTE — Telephone Encounter (Signed)
Message copied by Barron Alvine on Tue Jul 04, 2014  1:28 PM ------      Message from: [name]      Created: Tue Jul 04, 2014  1:17 PM       Julien Nordmann,            Sorry, I'm out of town.  We'll get this set up for next available time.  I saw his CA 19-9 is up, PET shows normal pancreas. Interesting.             Nasira Janusz,      He needs upper EUS, radial +/- linear, next available EUS Thursday, ++MAC.  Thanks                  ----- Message -----         From: Curt Bears, MD         Sent: 06/29/2014   2:41 PM           To: [name], MD            Jacqulyn Cane,      This patient presents with metastatic adenocarcinoma and the final pathology was nonconclusive for lung primary but suspicious for upper GI or pancreaticobiliary. You did EUS on him last year and there was some questionable pancreatic abnormality/chronic pancreatitis. Please see for repeat EUS looking for the primary tumor. Thank you.      Mohamed       ------

## 2014-07-05 ENCOUNTER — Encounter (HOSPITAL_COMMUNITY): Payer: Self-pay | Admitting: Pharmacy Technician

## 2014-07-06 ENCOUNTER — Encounter: Payer: Self-pay | Admitting: *Deleted

## 2014-07-06 ENCOUNTER — Encounter (HOSPITAL_COMMUNITY): Payer: Self-pay | Admitting: *Deleted

## 2014-07-06 NOTE — [facility] Oncology Navigator Note (Signed)
Patient called because he received notification that his EUS is scheduled for 07/25/14.  He asked if this date was too long to wait.  I checked his surgery schedule and he is scheduled for 07/20/14.  I discussed with Dr. Julien Nordmann and then informed patient that we will be receiving results next week of the molecular studies performed and based upon the results, he may begin treatment prior to the EUS.  I also informed him that his EUS is scheduled for 07/20/14.  Patient denied any further questions at this time.

## 2014-07-07 ENCOUNTER — Telehealth: Payer: Self-pay | Admitting: Internal Medicine

## 2014-07-07 NOTE — Telephone Encounter (Signed)
s.w. pt and advised on new appt time...pt ok and aware

## 2014-07-10 ENCOUNTER — Telehealth: Payer: Self-pay | Admitting: Internal Medicine

## 2014-07-10 NOTE — Telephone Encounter (Signed)
talked w/pt to advise of new time & date-pt states was cld w/different time-adv per pof to r/s to 9/1-pt understood-stated would be here

## 2014-07-11 ENCOUNTER — Encounter: Payer: Self-pay | Admitting: Gastroenterology

## 2014-07-13 ENCOUNTER — Telehealth: Payer: Self-pay | Admitting: Medical Oncology

## 2014-07-13 ENCOUNTER — Ambulatory Visit: Payer: BC Managed Care – PPO | Admitting: Internal Medicine

## 2014-07-13 NOTE — Telephone Encounter (Signed)
Message copied by Ardeen Garland on Thu Jul 13, 2014  1:37 PM ------      Message from: Curt Bears      Created: Wed Jul 12, 2014  4:38 PM       Not yet.      ----- Message -----         From: Ardeen Garland, RN         Sent: 07/12/2014   3:50 PM           To: Curt Bears, MD            Asking if genetic test results are in .        ------

## 2014-07-13 NOTE — Telephone Encounter (Signed)
Pt.notified

## 2014-07-14 ENCOUNTER — Other Ambulatory Visit: Payer: Self-pay | Admitting: Thoracic Surgery (Cardiothoracic Vascular Surgery)

## 2014-07-14 ENCOUNTER — Telehealth: Payer: Self-pay | Admitting: Medical Oncology

## 2014-07-14 ENCOUNTER — Other Ambulatory Visit (HOSPITAL_COMMUNITY)
Admission: RE | Admit: 2014-07-14 | Discharge: 2014-07-14 | Disposition: A | Discharge status: Home / Self Care | Payer: BC Managed Care – PPO | Source: Ambulatory Visit | Attending: Internal Medicine | Admitting: Internal Medicine

## 2014-07-14 DIAGNOSIS — C3410 Malignant neoplasm of upper lobe, unspecified bronchus or lung: Secondary | ICD-10-CM | POA: Insufficient documentation

## 2014-07-14 DIAGNOSIS — C329 Malignant neoplasm of larynx, unspecified: Secondary | ICD-10-CM

## 2014-07-14 NOTE — Telephone Encounter (Signed)
I told pt tissue was sent for genetic testing.

## 2014-07-14 NOTE — Telephone Encounter (Signed)
Message copied by Ardeen Garland on Fri Jul 14, 2014 12:19 PM ------      Message from: Curt Bears      Created: Wed Jul 12, 2014  4:38 PM       Not yet.      ----- Message -----         From: Ardeen Garland, RN         Sent: 07/12/2014   3:50 PM           To: Curt Bears, MD            Asking if genetic test results are in .        ------

## 2014-07-18 ENCOUNTER — Telehealth: Payer: Self-pay | Admitting: Internal Medicine

## 2014-07-18 ENCOUNTER — Other Ambulatory Visit: Payer: Self-pay | Admitting: Internal Medicine

## 2014-07-18 ENCOUNTER — Encounter: Payer: Self-pay | Admitting: Thoracic Surgery (Cardiothoracic Vascular Surgery)

## 2014-07-18 ENCOUNTER — Ambulatory Visit
Admission: RE | Admit: 2014-07-18 | Discharge: 2014-07-18 | Disposition: A | Discharge status: Home / Self Care | Payer: BC Managed Care – PPO | Source: Ambulatory Visit | Attending: Thoracic Surgery (Cardiothoracic Vascular Surgery) | Admitting: Thoracic Surgery (Cardiothoracic Vascular Surgery)

## 2014-07-18 ENCOUNTER — Encounter: Payer: Self-pay | Admitting: Internal Medicine

## 2014-07-18 ENCOUNTER — Ambulatory Visit (HOSPITAL_BASED_OUTPATIENT_CLINIC_OR_DEPARTMENT_OTHER): Payer: BC Managed Care – PPO | Admitting: Internal Medicine

## 2014-07-18 ENCOUNTER — Telehealth: Payer: Self-pay | Admitting: Medical Oncology

## 2014-07-18 ENCOUNTER — Ambulatory Visit (INDEPENDENT_AMBULATORY_CARE_PROVIDER_SITE_OTHER): Payer: BC Managed Care – PPO | Admitting: Thoracic Surgery (Cardiothoracic Vascular Surgery)

## 2014-07-18 VITALS — BP 140/89 | HR 65 | Resp 20 | Ht 72.0 in | Wt 146.0 lb

## 2014-07-18 VITALS — BP 124/76 | HR 69 | Temp 98.5°F | Resp 19 | Ht <= 58 in | Wt 146.6 lb

## 2014-07-18 DIAGNOSIS — E46 Unspecified protein-calorie malnutrition: Secondary | ICD-10-CM

## 2014-07-18 DIAGNOSIS — J90 Pleural effusion, not elsewhere classified: Secondary | ICD-10-CM

## 2014-07-18 DIAGNOSIS — C801 Malignant (primary) neoplasm, unspecified: Secondary | ICD-10-CM

## 2014-07-18 DIAGNOSIS — C3412 Malignant neoplasm of upper lobe, left bronchus or lung: Secondary | ICD-10-CM

## 2014-07-18 DIAGNOSIS — R634 Abnormal weight loss: Secondary | ICD-10-CM

## 2014-07-18 DIAGNOSIS — E041 Nontoxic single thyroid nodule: Secondary | ICD-10-CM

## 2014-07-18 DIAGNOSIS — C329 Malignant neoplasm of larynx, unspecified: Secondary | ICD-10-CM

## 2014-07-18 MED ORDER — METHYLPREDNISOLONE (PAK) 4 MG PO TABS: ORAL_TABLET | ORAL | Status: DC

## 2014-07-18 MED ORDER — OXYCODONE HCL 5 MG PO CAPS: 5.0000 mg | ORAL_CAPSULE | ORAL | Status: DC | PRN

## 2014-07-18 NOTE — Telephone Encounter (Signed)
Called pharmacy and told them pt will pick up medrol dose pack at by 1300.

## 2014-07-18 NOTE — Progress Notes (Signed)
[facility] [phone]   Fax:([phone]) [place], 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  PRIOR THERAPY: Status post right video-assisted thoracoscopy, drainage of pleural effusion, pleural biopsy, partial decortication, talc pleurodesis.under the care of Dr. Roxan Hockey on 06/23/2014.  CURRENT THERAPY: None  INTERVAL HISTORY: Henry [name] 63 y.o. male returns to the clinic today for followup visit accompanied by his sister. The patient is feeling fine today except for mild pain on the right side of the chest and  Mild shortness of breath mainly with exertion. The patient recently underwent MRI of the brain which showed no evidence for metastatic disease to the brain. He was also referred to Dr. Ardis Hughs for consideration of endoscopic ultrasound and evaluation of any upper gastrointestinal malignancy including pancreatic. He is expected to have this procedure few days. His molecular studies are still pending. There was a delay to send it from the pathology Department. The patient denied having any other significant complaints today. He has no nausea or vomiting, no fever or chills. He lost few pounds since his last visit. He still concerned about his weight loss.  MEDICAL HISTORY: Past Medical History  Diagnosis Date  . Hypertension   . GERD (gastroesophageal reflux disease)   . Hemochromatosis   . Hx of adenomatous colonic polyps   . Umbilical hernia     none since weight loss  . Diverticulosis   . Lumbar spondylosis     ankle  . Pleural effusion on right 12/16/13    per chest xray  . Shortness of breath     with  exertion    ALLERGIES:  has No Known Allergies.  MEDICATIONS:  Current Outpatient Prescriptions  Medication Sig  Dispense Refill  . albuterol (PROVENTIL HFA;VENTOLIN HFA) 108 (90 BASE) MCG/ACT inhaler Inhale 1-2 puffs into the lungs every 4 (four) hours as needed for wheezing or shortness of breath.       . diltiazem (CARDIZEM CD) 360 MG 24 hr capsule Take 360 mg by mouth every morning.       . hydrochlorothiazide (HYDRODIURIL) 25 MG tablet Take 25 mg by mouth every evening.       Marland Kitchen ibuprofen (ADVIL,MOTRIN) 200 MG tablet Take 400 mg by mouth every 6 (six) hours as needed (Pain).      . nebivolol (BYSTOLIC) 5 MG tablet Take 5 mg by mouth every evening.       Marland Kitchen oxyCODONE (OXY IR/ROXICODONE) 5 MG immediate release tablet Take 5-10 mg by mouth every 4 (four) hours as needed for severe pain.      . potassium chloride SA (K-DUR,KLOR-CON) 20 MEQ tablet Take 40 mEq by mouth daily.       . Probiotic Product (ALIGN) 4 MG CAPS Take 4 mg by mouth daily.       . VOLTAREN 1 % GEL Apply 2 g topically 3 (three) times a week. Used on ankle       No current facility-administered medications for this visit.    SURGICAL HISTORY:  Past Surgical History  Procedure Laterality Date  . Lumbar laminectomy  1983  . Knee arthrsoscopy  2003    left  . Tonsillectomy  1962  . Eus N/A 04/07/2013    Procedure: UPPER ENDOSCOPIC ULTRASOUND (EUS) LINEAR;  Surgeon: [name], MD;  Location: [facility];  Service: Endoscopy;  Laterality: N/A;  . Video assisted thoracoscopy (vats)/decortication Right 06/22/2014    Procedure: VIDEO ASSISTED THORACOSCOPY (VATS)/DECORTICATION;  Surgeon: [name], MD;  Location: [facility];  Service: Thoracic;  Laterality: Right;  . Pleural effusion drainage Right 06/22/2014    Procedure: DRAINAGE OF PLEURAL EFFUSION;  Surgeon: [name], MD;  Location: [facility];  Service: Thoracic;  Laterality: Right;    REVIEW OF SYSTEMS:  Constitutional: positive for fatigue and weight loss Eyes: negative Ears, nose, mouth, throat, and face: negative Respiratory: positive for dyspnea on  exertion Cardiovascular: negative Gastrointestinal: negative Genitourinary:negative Integument/breast: negative Hematologic/lymphatic: negative Musculoskeletal:negative Neurological: negative Behavioral/Psych: negative Endocrine: negative Allergic/Immunologic: negative   PHYSICAL EXAMINATION: General appearance: alert, cooperative, fatigued and no distress Head: Normocephalic, without obvious abnormality, atraumatic Neck: no adenopathy, no JVD, supple, symmetrical, trachea midline and thyroid not enlarged, symmetric, no tenderness/mass/nodules Lymph nodes: Cervical, supraclavicular, and axillary nodes normal. Resp: clear to auscultation bilaterally Back: symmetric, no curvature. ROM normal. No CVA tenderness. Cardio: regular rate and rhythm, S1, S2 normal, no murmur, click, rub or gallop GI: soft, non-tender; bowel sounds normal; no masses,  no organomegaly Extremities: extremities normal, atraumatic, no cyanosis or edema Neurologic: Alert and oriented X 3, normal strength and tone. Normal symmetric reflexes. Normal coordination and gait  ECOG PERFORMANCE STATUS: 1 - Symptomatic but completely ambulatory  Blood pressure 124/76, pulse 69, temperature 98.5 F (36.9 C), temperature source Oral, resp. rate 19, height 6" (0.152 m), weight 146 lb 9.6 oz (66.497 kg).  LABORATORY DATA: Lab Results  Component Value Date   WBC 8.3 06/29/2014   HGB 15.5 06/29/2014   HCT 46.5 06/29/2014   MCV 98.4* 06/29/2014   PLT 247 06/29/2014      Chemistry      Component Value Date/Time   NA 141 06/29/2014 1505   NA 140 06/24/2014 0346   K 3.8 06/29/2014 1505   K 3.3* 06/24/2014 0346   CL 102 06/24/2014 0346   CO2 29 06/29/2014 1505   CO2 28 06/24/2014 0346   BUN 14.4 06/29/2014 1505   BUN 11 06/24/2014 0346   CREATININE 0.7 06/29/2014 1505   CREATININE 0.75 06/24/2014 0346      Component Value Date/Time   CALCIUM 9.2 06/29/2014 1505   CALCIUM 8.6 06/24/2014 0346   ALKPHOS 60 06/29/2014 1505   ALKPHOS 54  06/24/2014 0346   AST 21 06/29/2014 1505   AST 13 06/24/2014 0346   ALT 21 06/29/2014 1505   ALT 11 06/24/2014 0346   BILITOT 0.75 06/29/2014 1505   BILITOT 0.6 06/24/2014 0346       RADIOGRAPHIC STUDIES: Dg Chest 2 View  06/28/2014   CLINICAL DATA:  Pneumothorax  EXAM: CHEST  2 VIEW  COMPARISON:  June 27, 2014  FINDINGS: The pneumothorax on the right is again noted. The apical component appears slightly smaller. The basilar component appears stable. There is extensive subcutaneous air on the right. There is no appreciable tension component. There is a small effusion on the right. There is patchy infiltrate in the right upper and lower lobes. On the left, the interstitium remains prominent with areas of patchy atelectatic change, stable. No new opacity is identified. The heart size is normal. Pulmonary vascularity is within normal limits. Central catheter tip is in the superior vena cava near the cavoatrial junction.  IMPRESSION: Pneumothorax again noted on the right, perhaps slightly smaller in the apical component and stable in the basilar component. There is a  small effusion on the right as well as areas of patchy infiltrate in the right lung, essentially stable. Extensive subcutaneous air on the right is stable. Interstitial prominence with patchy atelectasis on the left is stable. No change in cardiac silhouette.   Electronically Signed   By: Lowella Grip M.D.   On: 06/28/2014 08:15   Dg Chest 2 View  06/25/2014   CLINICAL DATA:  63 year old male status post right VATS.  Follow-up.  EXAM: CHEST  2 VIEW  COMPARISON:  06/24/2014 and prior chest radiographs  FINDINGS: Cardiomediastinal silhouette is stable.  A 5% right apical pneumothorax and small right basilar pneumothorax are unchanged.  Right thoracic postoperative changes and right thoracostomy tubes again noted.  Right subcutaneous emphysema has minimally increased.  A right IJ central venous catheter is present with tip overlying the superior  cavoatrial junction.  Right lower lung atelectasis is unchanged.  No large effusions identified.  IMPRESSION: Unchanged small right apical and right basilar pneumothorax.  Slightly increased right subcutaneous emphysema.  No other significant change identified.   Electronically Signed   By: Hassan Rowan M.D.   On: 06/25/2014 07:38   Chest 2 View  06/22/2014   CLINICAL DATA:  Hydropneumothorax  EXAM: CHEST  2 VIEW  COMPARISON:  Chest CT June 21, 2014  FINDINGS: There is a persistent hydro pneumothorax on the right. The pneumothorax portion appears slightly smaller than 1 day prior. There is no appreciable tension component.  There is underlying emphysematous change. There are scattered areas of lung scarring bilaterally. There is no edema or consolidation on the left. Small nodular opacities on both sides of appears stable compared to CT study 1 day prior. Heart size is normal. Pulmonary vascularity reflects underlying emphysema. No adenopathy.  IMPRESSION: Persistent hydropneumothorax in the right, slightly smaller than 1 day prior. No tension component. Underlying emphysema. Multiple small nodular opacities appear stable.   Electronically Signed   By: Lowella Grip M.D.   On: 06/22/2014 12:58   Dg Chest 2 View  06/19/2014   CLINICAL DATA:  Cough. History of right pleural effusion and thoracentesis 06/16/2014.  EXAM: CHEST  2 VIEW  COMPARISON:  Radiographs 11/24/2013, 06/15/2014 and 06/16/2014.  FINDINGS: There is a persistent right-sided hydro pneumothorax status post recent right-sided thoracentesis. Compared with the study performed 3 days ago, there is slightly increased air and fluid within the right pleural space. Underlying right basilar pulmonary opacity and partial right lung collapse are stable. There is no mediastinal shift. The left lung is clear. The visualized heart size and mediastinal contours are stable.  IMPRESSION: Slight enlargement of right-sided hydro pneumothorax following recent  thoracentesis. No associated mediastinal shift.   Electronically Signed   By: Camie Patience M.D.   On: 06/19/2014 14:32   Ct Chest Wo Contrast  06/21/2014   CLINICAL DATA:  Preop right hydropneumothorax  EXAM: CT CHEST WITHOUT CONTRAST  TECHNIQUE: Multidetector CT imaging of the chest was performed following the standard protocol without IV contrast.  COMPARISON:  Chest radiograph dated 06/19/2014. Partial comparison to CT abdomen pelvis dated 02/04/2013.  FINDINGS: Moderate right hydropneumothorax. Layering debris within the right pleural collection, hyperdense, possibly reflecting hemorrhage.  Subpleural opacities/nodularity, right lung predominant, favored to reflect endobronchial spread of infection. When correlating with prior CT abdomen pelvis, the appearance of the lung bases are new.  Compressive atelectasis in the right lower lobe.  Visualized thyroid is unremarkable.  The heart is normal in size. No pericardial effusion. Coronary atherosclerosis. Atherosclerotic calcifications of the  aortic arch.  Small mediastinal lymph nodes, which do not meet pathologic CT size criteria.  Visualized upper abdomen is notable for small hepatic cysts.  Degenerative changes of the visualized thoracolumbar spine.  IMPRESSION: Moderate right hydro pneumothorax with suspected layering debris/hemorrhage.  Underlying multifocal subpleural opacity/nodularity, right lung predominant, favored to reflect endobronchial spread of infection. Consider follow-up CT chest in 4-6 weeks to document resolution.  Underlying compressive atelectasis in the right lower lobe.   Electronically Signed   By: Julian Hy M.D.   On: 06/21/2014 15:58   Mr Jeri Cos PP Contrast  07/04/2014   CLINICAL DATA:  63 year old male with metastatic adenocarcinoma status post partial decortication and pleurodesis. Staging. Subsequent encounter.  EXAM: MRI HEAD WITHOUT AND WITH CONTRAST  TECHNIQUE: Multiplanar, multiecho pulse sequences of the brain and  surrounding structures were obtained without and with intravenous contrast.  CONTRAST:  29mL MULTIHANCE GADOBENATE DIMEGLUMINE 529 MG/ML IV SOLN  COMPARISON:  PET-CT 06/30/2014.  FINDINGS: Some post-contrast images suggest subtle smooth pachymeningeal or increased leptomeningeal enhancement (e.g. Series 11, image 28 and series 13, image 10), but there is no definite abnormal meningeal thickening or enhancement. No enhancing brain lesion identified. No midline shift, mass effect, or evidence of intracranial mass lesion.  No restricted diffusion to suggest acute infarction. No ventriculomegaly, extra-axial collection or acute intracranial hemorrhage. Cervicomedullary junction and pituitary are within normal limits. Cerebral volume is within normal limits for age. Major intracranial vascular flow voids are preserved. Pearline Cables and white matter signal is within normal limits for age throughout the brain.  Visible internal auditory structures appear normal. Mastoids are clear. Scattered paranasal sinus mucosal thickening, inspissated secretions in the left frontal sinus. Mild Visualized orbit soft tissues are within normal limits. Visualized scalp soft tissues are within normal limits. Visualized bone marrow signal is within normal limits.  IMPRESSION: No acute or metastatic intracranial abnormality.   Electronically Signed   By: Lars Pinks M.D.   On: 07/04/2014 19:33   Nm Pet Image Initial (pi) Skull Base To Thigh  06/30/2014   CLINICAL DATA:  Initial treatment strategy for metastatic lung cancer (adenocarcinoma). Patient underwent VATS with partial decortication and talc pleurodesis 06/22/2014.  EXAM: NUCLEAR MEDICINE PET SKULL BASE TO THIGH  TECHNIQUE: 7.44 mCi F-18 FDG was injected intravenously. Full-ring PET imaging was performed from the skull base to thigh after the radiotracer. CT data was obtained and used for attenuation correction and anatomic localization.  FASTING BLOOD GLUCOSE:  Value: 95 mg/dl  COMPARISON:   Chest CT 06/21/2014  FINDINGS: NECK  No hypermetabolic cervical lymph nodes are identified.There are no lesions of the pharyngeal mucosal space. There is a focal hypermetabolic nodule involving the inferior left thyroid lobe. This measures approximately 1.5 cm and has an SUV max of 9.6.  CHEST  There are no hypermetabolic mediastinal, hilar or axillary lymph nodes. There is a persistent moderate size right hydro pneumothorax with partial lung collapse. Multiple air bubbles have developed within the posterior inferior pleural space. There is high density within the pleural space consistent with interval pleurodesis. There is hypermetabolic activity throughout the right pleural space corresponding with known metastatic disease. This has an SUV max of 4.3. The ill-defined subpleural nodularity in both lungs is also mildly hypermetabolic. No dominant lung mass or endobronchial lesion identified. There is soft tissue emphysema within the right chest wall laterally.  ABDOMEN/PELVIS  There is no hypermetabolic activity within the liver, adrenal glands, spleen or pancreas. There is no hypermetabolic nodal activity. Hepatic cysts  and mild atherosclerosis noted. The prostate gland is moderately enlarged.  SKELETON  There is no hypermetabolic activity to suggest osseous metastatic disease.  IMPRESSION: 1. There is hypermetabolic activity throughout the right pleural space status post recent pleural decortication and talc pleurodesis for a malignant pleural effusion. 2. No dominant hypermetabolic lung mass or mediastinal nodal disease demonstrated. The patchy subpleural nodular densities in both lungs demonstrate nonspecific low level hypermetabolic activity. This could be inflammatory or neoplastic. 3. No distant metastases identified. 4. Hypermetabolic left thyroid nodule. Hypermetabolic thyroid nodules on PET have up to 40-50% incidence of malignancy; recommend further evaluation with thyroid ultrasound and possible  US-guided fine needle aspiration.   Electronically Signed   By: Camie Patience M.D.   On: 06/30/2014 14:56   Dg Chest Port 1 View  06/27/2014   CLINICAL DATA:  Evaluate pneumothorax  EXAM: PORTABLE CHEST - 1 VIEW  COMPARISON:  06/27/2014  FINDINGS: Persistent right-sided pneumothorax is noted. It appears slightly more prominent when compared with the prior exam. Persistent patchy changes are noted throughout both lungs but worse on the right. A right-sided jugular line is again seen. The cardiac shadow is stable.  IMPRESSION: Slightly more prominent right pneumothorax when compared with the earlier film   Electronically Signed   By: Inez Catalina M.D.   On: 06/27/2014 15:25   Dg Chest Port 1 View  06/27/2014   CLINICAL DATA:  Chest tube removal.  EXAM: PORTABLE CHEST - 1 VIEW  COMPARISON:  06/27/2014 at 6:09 a.m.  FINDINGS: Right IJ central venous catheter has been advanced slightly as tip is over the cavoatrial junction. There has been interval removal of the right basilar chest tube.  There is evidence of a small right pneumothorax unchanged over the apex but slightly more visible in the right base. There is mild opacification in the right base likely atelectasis, although cannot exclude infection. There is minimal prominence of the perihilar markings as cannot exclude a mild degree of facet congestion. Cardiomediastinal silhouette and remainder the exam is unchanged including subcutaneous emphysema over the right flank.  IMPRESSION: Small right-sided pneumothorax post right basilar chest tube removal. Pneumothorax is unchanged overall although more visible on the right base.  Mild opacification in the right base likely atelectasis, although cannot exclude infection. Suggestion of minimal vascular congestion.  Slight advancement of the right IJ central venous catheter with tip over the cavoatrial junction.   Electronically Signed   By: Marin Olp M.D.   On: 06/27/2014 10:25   Dg Chest Port 1  View  06/27/2014   CLINICAL DATA:  Pneumothorax  EXAM: PORTABLE CHEST - 1 VIEW  COMPARISON:  06/26/1949  FINDINGS: Cardiac shadow is stable. A a right-sided central venous line is again seen. Small right-sided pneumothorax is again noted. There is been interval removal of 1 of the 2 residual chest tubes. Persistent right basilar infiltrate is seen. The left lung remains clear.  IMPRESSION: Interval removal of one right chest tube. One right chest tube remains in the base. The right-sided pneumothorax is stable from the prior exam. Remainder of the study is stable.   Electronically Signed   By: Inez Catalina M.D.   On: 06/27/2014 07:55   Dg Chest Port 1 View  06/26/2014   CLINICAL DATA:  Check chest tube  EXAM: PORTABLE CHEST - 1 VIEW  COMPARISON:  06/25/2014  FINDINGS: Cardiac shadow is stable. The left lung remains clear. A right-sided central venous line is again noted. Two chest tubes are noted  on the right. A tiny right pneumothorax remains. Early infiltrate is noted in the right lung base.  IMPRESSION: Persistent small right-sided pneumothorax following chest tube removal  Early right basilar infiltrate.   Electronically Signed   By: Inez Catalina M.D.   On: 06/26/2014 08:00   Dg Chest Port 1 View  06/23/2014   CLINICAL DATA:  Evaluate pneumothorax  EXAM: PORTABLE CHEST - 1 VIEW  COMPARISON:  Prior chest x-ray 06/22/2014  FINDINGS: No definite residual apical pneumothorax. There is a tiny basilar pneumothorax in the right costophrenic angle. Chest tubes remain in good position. There are 3. Right IJ central venous catheter with the tip at the superior cavoatrial junction. Slightly decreased subcutaneous emphysema along the right lateral chest wall. Persistent right basilar airspace opacity consistent with a combination of atelectasis and infiltrate. Lower inspiratory volumes. Slightly increased subsegmental atelectasis in the left base and mid lung. a. Stable cardiac and mediastinal contours. Aortic  atherosclerosis. No acute osseous abnormality.  IMPRESSION: 1. Persistent small basilar pneumothorax in the right costophrenic angle. 2. Interval resolution of right apical pneumothorax. 3. Stable and satisfactory support apparatus. 4. Slightly lower inspiratory volumes with increased subsegmental atelectasis.   Electronically Signed   By: Jacqulynn Cadet M.D.   On: 06/23/2014 07:40   Dg Chest Port 1 View  06/22/2014   CLINICAL DATA:  Recent hydropneumothorax  EXAM: PORTABLE CHEST - 1 VIEW  COMPARISON:  Study obtained earlier in the day  FINDINGS: There are now 2 chest tubes on the right. There is currently a minimal right apical pneumothorax. There is moderate soft tissue air on the right. There has been essentially complete resolution of effusion on the right. There is patchy atelectasis in the right base.  The lungs are elsewhere clear except for the small nodular lesions noted previously. The heart size and pulmonary vascularity are normal. There is no a central catheter with the tip in the superior vena cava.  IMPRESSION: Only minimal pneumothorax remains in the right apex following chest tube placements on the right. There is right base atelectasis but no appreciable effusion. There is moderate subcutaneous air on the right. Central catheter tip in superior vena cava. Small nodular opacities remain bilaterally, unchanged.   Electronically Signed   By: Lowella Grip M.D.   On: 06/22/2014 17:01   Dg Chest Port 1v Same Day  06/24/2014   CLINICAL DATA:  Status post VATS with drainage of right pleural effusion and pleurodesis.  EXAM: PORTABLE CHEST - 1 VIEW SAME DAY  COMPARISON:  06/23/2014  FINDINGS: Right chest tube shows stable positioning. There is a 5% apical pneumothorax on the right. There is some increase in subcutaneous emphysema within the chest wall. Mild atelectasis remains in the right lower lung. No pulmonary edema. No significant pleural fluid. The heart size is normal.  IMPRESSION: 5%  right apical pneumothorax.   Electronically Signed   By: Aletta Edouard M.D.   On: 06/24/2014 11:01    ASSESSMENT AND PLAN: This is a very pleasant 63 years old white male with   1) metastatic adenocarcinoma of unknown primary questionable to be lung cancer versus upper gastrointestinal versus pancreatic. The patient is scheduled for upper endoscopy with endoscopic ultrasound by Dr. Ardis Hughs in few days. I am also awaiting the molecular studies before starting the patient on any systemic therapy. I gave the patient the option of proceeding with broad-spectrum chemotherapy versus waiting for the molecular studies and the gastrointestinal evaluation. He is currently mildly symptomatic and would  like to wait.  2) Suspicious left thyroid nodule: I ordered ultrasound guided fine needle aspiration of this lesion to rule out malignancy.  3) weight loss and malnutrition: I referred the patient to the dietitian at the [facility]. I also started him on Medrol Dosepak to stimulate his appetite  The patient would come back for followup visit in one week for reevaluation and more detailed discussion of his treatment options based on the pending studies. He was advised to call immediately if he has any concerning symptoms in the interval. The patient voices understanding of current disease status and treatment options and is in agreement with the current care plan.  All questions were answered. The patient knows to call the clinic with any problems, questions or concerns. We can certainly see the patient much sooner if necessary.  I spent 20 minutes counseling the patient face to face. The total time spent in the appointment was 30 minutes.  Disclaimer: This note was dictated with voice recognition software. Similar sounding words can inadvertently be transcribed and may not be corrected upon review.

## 2014-07-18 NOTE — Progress Notes (Signed)
HPI: Henry [name] returns for a scheduled postoperative followup visit.  He is a 63 year old gentleman with a history tobacco abuse who presented with a pleural effusion. Thoracentesis was done, but he had a trapped lung. We did a right thoracoscopic drainage of pleural effusion and decortication. There was a partial reexpansion of the lung. He had extensive involvement of his pleura by tumor. Pathology showed adenocarcinoma. He has been seen by Dr. Inda Merlin. We are awaiting the results of his foundation 1 genetic testing prior to initiating treatment.  He says that he still having some pain. He is taking his pain medication 2 or 3 times a day. He is almost out of the oxycodone. He does say that he feels like he presented better when he is taking oxycodone. His incisional pain is improving.  Past Medical History  Diagnosis Date  . Hypertension   . GERD (gastroesophageal reflux disease)   . Hemochromatosis   . Hx of adenomatous colonic polyps   . Umbilical hernia     none since weight loss  . Diverticulosis   . Lumbar spondylosis     ankle  . Pleural effusion on right 12/16/13    per chest xray  . Shortness of breath     with  exertion      Current Outpatient Prescriptions  Medication Sig Dispense Refill  . albuterol (PROVENTIL HFA;VENTOLIN HFA) 108 (90 BASE) MCG/ACT inhaler Inhale 1-2 puffs into the lungs every 4 (four) hours as needed for wheezing or shortness of breath.       . diltiazem (CARDIZEM CD) 360 MG 24 hr capsule Take 360 mg by mouth every morning.       . hydrochlorothiazide (HYDRODIURIL) 25 MG tablet Take 25 mg by mouth every evening.       Marland Kitchen ibuprofen (ADVIL,MOTRIN) 200 MG tablet Take 400 mg by mouth every 6 (six) hours as needed (Pain).      . methylPREDNIsolone (MEDROL DOSPACK) 4 MG tablet follow package directions  21 tablet  0  . nebivolol (BYSTOLIC) 5 MG tablet Take 5 mg by mouth every evening.       Marland Kitchen oxycodone (OXY-IR) 5 MG capsule Take 1 capsule (5 mg total) by  mouth every 4 (four) hours as needed.  30 capsule  0  . potassium chloride SA (K-DUR,KLOR-CON) 20 MEQ tablet Take 40 mEq by mouth daily.       . Probiotic Product (ALIGN) 4 MG CAPS Take 4 mg by mouth daily.       . VOLTAREN 1 % GEL Apply 2 g topically 3 (three) times a week. Used on ankle       No current facility-administered medications for this visit.    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 He did have a thyroid nodule show up on PET CT. That is going to be biopsy done next week. He has been seen by Dr. Julien Nordmann and is awaiting the results of foundation 1 testing prior to initiating chemotherapy.  From a surgical standpoint he's doing well. He still having some discomfort, but overall it is  relatively mild. His breathing has improved, but has not returned to his baseline prior to his illness. Unfortunately I do not think he will return to his pre-illness baseline from the standpoint of his respiratory capacity.  He is almost a pain medication so I gave him a prescription for an additional 30 tablets with oxycodone 5 mg 1-2 tablets 4 times daily as needed for pain.  Plan: I will be happy to see Henry [name] back if I can be of any further assistance with his care in the future

## 2014-07-19 LAB — FUNGUS CULTURE W SMEAR: Fungal Smear: NONE SEEN

## 2014-07-19 NOTE — Progress Notes (Signed)
07-19-14 1445 reminder call of appointment. 07-20-14 to arrived a 0900 AM [facility] admitting.W. Floy Sabina

## 2014-07-20 ENCOUNTER — Encounter (HOSPITAL_COMMUNITY): Payer: Self-pay | Admitting: Anesthesiology

## 2014-07-20 ENCOUNTER — Ambulatory Visit (HOSPITAL_COMMUNITY): Payer: BC Managed Care – PPO | Admitting: Anesthesiology

## 2014-07-20 ENCOUNTER — Ambulatory Visit (HOSPITAL_COMMUNITY)
Admission: RE | Admit: 2014-07-20 | Discharge: 2014-07-20 | Disposition: A | Discharge status: Home / Self Care | Payer: BC Managed Care – PPO | Source: Ambulatory Visit | Attending: Gastroenterology | Admitting: Gastroenterology

## 2014-07-20 ENCOUNTER — Telehealth: Payer: Self-pay

## 2014-07-20 ENCOUNTER — Encounter (HOSPITAL_COMMUNITY)
Admission: RE | Disposition: A | Discharge status: Home / Self Care | Payer: Self-pay | Source: Ambulatory Visit | Attending: Gastroenterology

## 2014-07-20 ENCOUNTER — Encounter (HOSPITAL_COMMUNITY): Payer: BC Managed Care – PPO | Admitting: Anesthesiology

## 2014-07-20 DIAGNOSIS — C50919 Malignant neoplasm of unspecified site of unspecified female breast: Secondary | ICD-10-CM | POA: Insufficient documentation

## 2014-07-20 DIAGNOSIS — Z79899 Other long term (current) drug therapy: Secondary | ICD-10-CM | POA: Diagnosis not present

## 2014-07-20 DIAGNOSIS — F17200 Nicotine dependence, unspecified, uncomplicated: Secondary | ICD-10-CM | POA: Diagnosis not present

## 2014-07-20 DIAGNOSIS — I10 Essential (primary) hypertension: Secondary | ICD-10-CM | POA: Diagnosis not present

## 2014-07-20 DIAGNOSIS — M129 Arthropathy, unspecified: Secondary | ICD-10-CM | POA: Diagnosis not present

## 2014-07-20 DIAGNOSIS — C801 Malignant (primary) neoplasm, unspecified: Secondary | ICD-10-CM | POA: Diagnosis present

## 2014-07-20 DIAGNOSIS — B3781 Candidal esophagitis: Secondary | ICD-10-CM | POA: Diagnosis not present

## 2014-07-20 DIAGNOSIS — R932 Abnormal findings on diagnostic imaging of liver and biliary tract: Secondary | ICD-10-CM

## 2014-07-20 DIAGNOSIS — K219 Gastro-esophageal reflux disease without esophagitis: Secondary | ICD-10-CM | POA: Insufficient documentation

## 2014-07-20 DIAGNOSIS — C779 Secondary and unspecified malignant neoplasm of lymph node, unspecified: Secondary | ICD-10-CM

## 2014-07-20 HISTORY — PX: EUS: SHX5427

## 2014-07-20 SURGERY — UPPER ENDOSCOPIC ULTRASOUND (EUS) LINEAR
Anesthesia: Monitor Anesthesia Care

## 2014-07-20 MED ORDER — FLUCONAZOLE 100 MG PO TABS: 100.0000 mg | ORAL_TABLET | Freq: Every day | ORAL | Status: DC

## 2014-07-20 MED ORDER — PROPOFOL 10 MG/ML IV BOLUS
INTRAVENOUS | Status: AC
  Filled 2014-07-20: qty 20

## 2014-07-20 MED ORDER — SODIUM CHLORIDE 0.9 % IV SOLN: INTRAVENOUS | Status: DC

## 2014-07-20 MED ORDER — PROPOFOL 10 MG/ML IV BOLUS
INTRAVENOUS | Status: DC | PRN
  Administered 2014-07-20 (×3): 50 mg via INTRAVENOUS
  Administered 2014-07-20: 25 mg via INTRAVENOUS
  Administered 2014-07-20: 50 mg via INTRAVENOUS
  Administered 2014-07-20 (×4): 25 mg via INTRAVENOUS
  Administered 2014-07-20: 50 mg via INTRAVENOUS
  Administered 2014-07-20: 25 mg via INTRAVENOUS
  Administered 2014-07-20 (×2): 50 mg via INTRAVENOUS

## 2014-07-20 MED ORDER — LACTATED RINGERS IV SOLN
INTRAVENOUS | Status: DC
  Administered 2014-07-20: 1000 mL via INTRAVENOUS

## 2014-07-20 MED ORDER — LACTATED RINGERS IV SOLN
INTRAVENOUS | Status: DC | PRN
  Administered 2014-07-20 (×2): via INTRAVENOUS

## 2014-07-20 NOTE — Discharge Instructions (Signed)

## 2014-07-20 NOTE — H&P (View-Only) (Signed)
[place] [phone]   Fax:([phone]) [place], [place] 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  PRIOR THERAPY: Status post right video-assisted thoracoscopy, drainage of pleural effusion, pleural biopsy, partial decortication, talc pleurodesis.under the care of Dr. Roxan Hockey on 06/23/2014.  CURRENT THERAPY: None  INTERVAL HISTORY: Henry [name] 63 y.o. male returns to the clinic today for followup visit accompanied by his sister. The patient is feeling fine today except for mild pain on the right side of the chest and  Mild shortness of breath mainly with exertion. The patient recently underwent MRI of the brain which showed no evidence for metastatic disease to the brain. He was also referred to Dr. Ardis Hughs for consideration of endoscopic ultrasound and evaluation of any upper gastrointestinal malignancy including pancreatic. He is expected to have this procedure few days. His molecular studies are still pending. There was a delay to send it from the pathology Department. The patient denied having any other significant complaints today. He has no nausea or vomiting, no fever or chills. He lost few pounds since his last visit. He still concerned about his weight loss.  MEDICAL HISTORY: Past Medical History  Diagnosis Date  . Hypertension   . GERD (gastroesophageal reflux disease)   . Hemochromatosis   . Hx of adenomatous colonic polyps   . Umbilical hernia     none since weight loss  . Diverticulosis   . Lumbar spondylosis     ankle  . Pleural effusion on right 12/16/13    per chest xray  . Shortness of breath     with  exertion    ALLERGIES:  has No Known Allergies.  MEDICATIONS:  Current Outpatient Prescriptions  Medication Sig  Dispense Refill  . albuterol (PROVENTIL HFA;VENTOLIN HFA) 108 (90 BASE) MCG/ACT inhaler Inhale 1-2 puffs into the lungs every 4 (four) hours as needed for wheezing or shortness of breath.       . diltiazem (CARDIZEM CD) 360 MG 24 hr capsule Take 360 mg by mouth every morning.       . hydrochlorothiazide (HYDRODIURIL) 25 MG tablet Take 25 mg by mouth every evening.       Marland Kitchen ibuprofen (ADVIL,MOTRIN) 200 MG tablet Take 400 mg by mouth every 6 (six) hours as needed (Pain).      . nebivolol (BYSTOLIC) 5 MG tablet Take 5 mg by mouth every evening.       Marland Kitchen oxyCODONE (OXY IR/ROXICODONE) 5 MG immediate release tablet Take 5-10 mg by mouth every 4 (four) hours as needed for severe pain.      . potassium chloride SA (K-DUR,KLOR-CON) 20 MEQ tablet Take 40 mEq by mouth daily.       . Probiotic Product (ALIGN) 4 MG CAPS Take 4 mg by mouth daily.       . VOLTAREN 1 % GEL Apply 2 g topically 3 (three) times a week. Used on ankle       No current facility-administered medications for this visit.    SURGICAL HISTORY:  Past Surgical History  Procedure Laterality Date  . Lumbar laminectomy  1983  . Knee arthrsoscopy  2003    left  . Tonsillectomy  1962  . Eus N/A 04/07/2013    Procedure: UPPER ENDOSCOPIC ULTRASOUND (EUS) LINEAR;  Surgeon: [name], MD;  Location: [facility];  Service: Endoscopy;  Laterality: N/A;  . Video assisted thoracoscopy (vats)/decortication Right 06/22/2014    Procedure: VIDEO ASSISTED THORACOSCOPY (VATS)/DECORTICATION;  Surgeon: [name], MD;  Location: [facility];  Service: Thoracic;  Laterality: Right;  . Pleural effusion drainage Right 06/22/2014    Procedure: DRAINAGE OF PLEURAL EFFUSION;  Surgeon: [name], MD;  Location: [facility];  Service: Thoracic;  Laterality: Right;    REVIEW OF SYSTEMS:  Constitutional: positive for fatigue and weight loss Eyes: negative Ears, nose, mouth, throat, and face: negative Respiratory: positive for dyspnea on  exertion Cardiovascular: negative Gastrointestinal: negative Genitourinary:negative Integument/breast: negative Hematologic/lymphatic: negative Musculoskeletal:negative Neurological: negative Behavioral/Psych: negative Endocrine: negative Allergic/Immunologic: negative   PHYSICAL EXAMINATION: General appearance: alert, cooperative, fatigued and no distress Head: Normocephalic, without obvious abnormality, atraumatic Neck: no adenopathy, no JVD, supple, symmetrical, trachea midline and thyroid not enlarged, symmetric, no tenderness/mass/nodules Lymph nodes: Cervical, supraclavicular, and axillary nodes normal. Resp: clear to auscultation bilaterally Back: symmetric, no curvature. ROM normal. No CVA tenderness. Cardio: regular rate and rhythm, S1, S2 normal, no murmur, click, rub or gallop GI: soft, non-tender; bowel sounds normal; no masses,  no organomegaly Extremities: extremities normal, atraumatic, no cyanosis or edema Neurologic: Alert and oriented X 3, normal strength and tone. Normal symmetric reflexes. Normal coordination and gait  ECOG PERFORMANCE STATUS: 1 - Symptomatic but completely ambulatory  Blood pressure 124/76, pulse 69, temperature 98.5 F (36.9 C), temperature source Oral, resp. rate 19, height 6" (0.152 m), weight 146 lb 9.6 oz (66.497 kg).  LABORATORY DATA: Lab Results  Component Value Date   WBC 8.3 06/29/2014   HGB 15.5 06/29/2014   HCT 46.5 06/29/2014   MCV 98.4* 06/29/2014   PLT 247 06/29/2014      Chemistry      Component Value Date/Time   NA 141 06/29/2014 1505   NA 140 06/24/2014 0346   K 3.8 06/29/2014 1505   K 3.3* 06/24/2014 0346   CL 102 06/24/2014 0346   CO2 29 06/29/2014 1505   CO2 28 06/24/2014 0346   BUN 14.4 06/29/2014 1505   BUN 11 06/24/2014 0346   CREATININE 0.7 06/29/2014 1505   CREATININE 0.75 06/24/2014 0346      Component Value Date/Time   CALCIUM 9.2 06/29/2014 1505   CALCIUM 8.6 06/24/2014 0346   ALKPHOS 60 06/29/2014 1505   ALKPHOS 54  06/24/2014 0346   AST 21 06/29/2014 1505   AST 13 06/24/2014 0346   ALT 21 06/29/2014 1505   ALT 11 06/24/2014 0346   BILITOT 0.75 06/29/2014 1505   BILITOT 0.6 06/24/2014 0346       RADIOGRAPHIC STUDIES: Dg Chest 2 View  06/28/2014   CLINICAL DATA:  Pneumothorax  EXAM: CHEST  2 VIEW  COMPARISON:  June 27, 2014  FINDINGS: The pneumothorax on the right is again noted. The apical component appears slightly smaller. The basilar component appears stable. There is extensive subcutaneous air on the right. There is no appreciable tension component. There is a small effusion on the right. There is patchy infiltrate in the right upper and lower lobes. On the left, the interstitium remains prominent with areas of patchy atelectatic change, stable. No new opacity is identified. The heart size is normal. Pulmonary vascularity is within normal limits. Central catheter tip is in the superior vena cava near the cavoatrial junction.  IMPRESSION: Pneumothorax again noted on the right, perhaps slightly smaller in the apical component and stable in the basilar component. There is a  small effusion on the right as well as areas of patchy infiltrate in the right lung, essentially stable. Extensive subcutaneous air on the right is stable. Interstitial prominence with patchy atelectasis on the left is stable. No change in cardiac silhouette.   Electronically Signed   By: Lowella Grip M.D.   On: 06/28/2014 08:15   Dg Chest 2 View  06/25/2014   CLINICAL DATA:  63 year old male status post right VATS.  Follow-up.  EXAM: CHEST  2 VIEW  COMPARISON:  06/24/2014 and prior chest radiographs  FINDINGS: Cardiomediastinal silhouette is stable.  A 5% right apical pneumothorax and small right basilar pneumothorax are unchanged.  Right thoracic postoperative changes and right thoracostomy tubes again noted.  Right subcutaneous emphysema has minimally increased.  A right IJ central venous catheter is present with tip overlying the superior  cavoatrial junction.  Right lower lung atelectasis is unchanged.  No large effusions identified.  IMPRESSION: Unchanged small right apical and right basilar pneumothorax.  Slightly increased right subcutaneous emphysema.  No other significant change identified.   Electronically Signed   By: Hassan Rowan M.D.   On: 06/25/2014 07:38   Chest 2 View  06/22/2014   CLINICAL DATA:  Hydropneumothorax  EXAM: CHEST  2 VIEW  COMPARISON:  Chest CT June 21, 2014  FINDINGS: There is a persistent hydro pneumothorax on the right. The pneumothorax portion appears slightly smaller than 1 day prior. There is no appreciable tension component.  There is underlying emphysematous change. There are scattered areas of lung scarring bilaterally. There is no edema or consolidation on the left. Small nodular opacities on both sides of appears stable compared to CT study 1 day prior. Heart size is normal. Pulmonary vascularity reflects underlying emphysema. No adenopathy.  IMPRESSION: Persistent hydropneumothorax in the right, slightly smaller than 1 day prior. No tension component. Underlying emphysema. Multiple small nodular opacities appear stable.   Electronically Signed   By: Lowella Grip M.D.   On: 06/22/2014 12:58   Dg Chest 2 View  06/19/2014   CLINICAL DATA:  Cough. History of right pleural effusion and thoracentesis 06/16/2014.  EXAM: CHEST  2 VIEW  COMPARISON:  Radiographs 11/24/2013, 06/15/2014 and 06/16/2014.  FINDINGS: There is a persistent right-sided hydro pneumothorax status post recent right-sided thoracentesis. Compared with the study performed 3 days ago, there is slightly increased air and fluid within the right pleural space. Underlying right basilar pulmonary opacity and partial right lung collapse are stable. There is no mediastinal shift. The left lung is clear. The visualized heart size and mediastinal contours are stable.  IMPRESSION: Slight enlargement of right-sided hydro pneumothorax following recent  thoracentesis. No associated mediastinal shift.   Electronically Signed   By: Camie Patience M.D.   On: 06/19/2014 14:32   Ct Chest Wo Contrast  06/21/2014   CLINICAL DATA:  Preop right hydropneumothorax  EXAM: CT CHEST WITHOUT CONTRAST  TECHNIQUE: Multidetector CT imaging of the chest was performed following the standard protocol without IV contrast.  COMPARISON:  Chest radiograph dated 06/19/2014. Partial comparison to CT abdomen pelvis dated 02/04/2013.  FINDINGS: Moderate right hydropneumothorax. Layering debris within the right pleural collection, hyperdense, possibly reflecting hemorrhage.  Subpleural opacities/nodularity, right lung predominant, favored to reflect endobronchial spread of infection. When correlating with prior CT abdomen pelvis, the appearance of the lung bases are new.  Compressive atelectasis in the right lower lobe.  Visualized thyroid is unremarkable.  The heart is normal in size. No pericardial effusion. Coronary atherosclerosis. Atherosclerotic calcifications of the  aortic arch.  Small mediastinal lymph nodes, which do not meet pathologic CT size criteria.  Visualized upper abdomen is notable for small hepatic cysts.  Degenerative changes of the visualized thoracolumbar spine.  IMPRESSION: Moderate right hydro pneumothorax with suspected layering debris/hemorrhage.  Underlying multifocal subpleural opacity/nodularity, right lung predominant, favored to reflect endobronchial spread of infection. Consider follow-up CT chest in 4-6 weeks to document resolution.  Underlying compressive atelectasis in the right lower lobe.   Electronically Signed   By: Julian Hy M.D.   On: 06/21/2014 15:58   Mr Jeri Cos MO Contrast  07/04/2014   CLINICAL DATA:  63 year old male with metastatic adenocarcinoma status post partial decortication and pleurodesis. Staging. Subsequent encounter.  EXAM: MRI HEAD WITHOUT AND WITH CONTRAST  TECHNIQUE: Multiplanar, multiecho pulse sequences of the brain and  surrounding structures were obtained without and with intravenous contrast.  CONTRAST:  38mL MULTIHANCE GADOBENATE DIMEGLUMINE 529 MG/ML IV SOLN  COMPARISON:  PET-CT 06/30/2014.  FINDINGS: Some post-contrast images suggest subtle smooth pachymeningeal or increased leptomeningeal enhancement (e.g. Series 11, image 28 and series 13, image 10), but there is no definite abnormal meningeal thickening or enhancement. No enhancing brain lesion identified. No midline shift, mass effect, or evidence of intracranial mass lesion.  No restricted diffusion to suggest acute infarction. No ventriculomegaly, extra-axial collection or acute intracranial hemorrhage. Cervicomedullary junction and pituitary are within normal limits. Cerebral volume is within normal limits for age. Major intracranial vascular flow voids are preserved. Pearline Cables and white matter signal is within normal limits for age throughout the brain.  Visible internal auditory structures appear normal. Mastoids are clear. Scattered paranasal sinus mucosal thickening, inspissated secretions in the left frontal sinus. Mild Visualized orbit soft tissues are within normal limits. Visualized scalp soft tissues are within normal limits. Visualized bone marrow signal is within normal limits.  IMPRESSION: No acute or metastatic intracranial abnormality.   Electronically Signed   By: Lars Pinks M.D.   On: 07/04/2014 19:33   Nm Pet Image Initial (pi) Skull Base To Thigh  06/30/2014   CLINICAL DATA:  Initial treatment strategy for metastatic lung cancer (adenocarcinoma). Patient underwent VATS with partial decortication and talc pleurodesis 06/22/2014.  EXAM: NUCLEAR MEDICINE PET SKULL BASE TO THIGH  TECHNIQUE: 7.44 mCi F-18 FDG was injected intravenously. Full-ring PET imaging was performed from the skull base to thigh after the radiotracer. CT data was obtained and used for attenuation correction and anatomic localization.  FASTING BLOOD GLUCOSE:  Value: 95 mg/dl  COMPARISON:   Chest CT 06/21/2014  FINDINGS: NECK  No hypermetabolic cervical lymph nodes are identified.There are no lesions of the pharyngeal mucosal space. There is a focal hypermetabolic nodule involving the inferior left thyroid lobe. This measures approximately 1.5 cm and has an SUV max of 9.6.  CHEST  There are no hypermetabolic mediastinal, hilar or axillary lymph nodes. There is a persistent moderate size right hydro pneumothorax with partial lung collapse. Multiple air bubbles have developed within the posterior inferior pleural space. There is high density within the pleural space consistent with interval pleurodesis. There is hypermetabolic activity throughout the right pleural space corresponding with known metastatic disease. This has an SUV max of 4.3. The ill-defined subpleural nodularity in both lungs is also mildly hypermetabolic. No dominant lung mass or endobronchial lesion identified. There is soft tissue emphysema within the right chest wall laterally.  ABDOMEN/PELVIS  There is no hypermetabolic activity within the liver, adrenal glands, spleen or pancreas. There is no hypermetabolic nodal activity. Hepatic cysts  and mild atherosclerosis noted. The prostate gland is moderately enlarged.  SKELETON  There is no hypermetabolic activity to suggest osseous metastatic disease.  IMPRESSION: 1. There is hypermetabolic activity throughout the right pleural space status post recent pleural decortication and talc pleurodesis for a malignant pleural effusion. 2. No dominant hypermetabolic lung mass or mediastinal nodal disease demonstrated. The patchy subpleural nodular densities in both lungs demonstrate nonspecific low level hypermetabolic activity. This could be inflammatory or neoplastic. 3. No distant metastases identified. 4. Hypermetabolic left thyroid nodule. Hypermetabolic thyroid nodules on PET have up to 40-50% incidence of malignancy; recommend further evaluation with thyroid ultrasound and possible  US-guided fine needle aspiration.   Electronically Signed   By: Camie Patience M.D.   On: 06/30/2014 14:56   Dg Chest Port 1 View  06/27/2014   CLINICAL DATA:  Evaluate pneumothorax  EXAM: PORTABLE CHEST - 1 VIEW  COMPARISON:  06/27/2014  FINDINGS: Persistent right-sided pneumothorax is noted. It appears slightly more prominent when compared with the prior exam. Persistent patchy changes are noted throughout both lungs but worse on the right. A right-sided jugular line is again seen. The cardiac shadow is stable.  IMPRESSION: Slightly more prominent right pneumothorax when compared with the earlier film   Electronically Signed   By: Inez Catalina M.D.   On: 06/27/2014 15:25   Dg Chest Port 1 View  06/27/2014   CLINICAL DATA:  Chest tube removal.  EXAM: PORTABLE CHEST - 1 VIEW  COMPARISON:  06/27/2014 at 6:09 a.m.  FINDINGS: Right IJ central venous catheter has been advanced slightly as tip is over the cavoatrial junction. There has been interval removal of the right basilar chest tube.  There is evidence of a small right pneumothorax unchanged over the apex but slightly more visible in the right base. There is mild opacification in the right base likely atelectasis, although cannot exclude infection. There is minimal prominence of the perihilar markings as cannot exclude a mild degree of facet congestion. Cardiomediastinal silhouette and remainder the exam is unchanged including subcutaneous emphysema over the right flank.  IMPRESSION: Small right-sided pneumothorax post right basilar chest tube removal. Pneumothorax is unchanged overall although more visible on the right base.  Mild opacification in the right base likely atelectasis, although cannot exclude infection. Suggestion of minimal vascular congestion.  Slight advancement of the right IJ central venous catheter with tip over the cavoatrial junction.   Electronically Signed   By: Marin Olp M.D.   On: 06/27/2014 10:25   Dg Chest Port 1  View  06/27/2014   CLINICAL DATA:  Pneumothorax  EXAM: PORTABLE CHEST - 1 VIEW  COMPARISON:  06/26/1949  FINDINGS: Cardiac shadow is stable. A a right-sided central venous line is again seen. Small right-sided pneumothorax is again noted. There is been interval removal of 1 of the 2 residual chest tubes. Persistent right basilar infiltrate is seen. The left lung remains clear.  IMPRESSION: Interval removal of one right chest tube. One right chest tube remains in the base. The right-sided pneumothorax is stable from the prior exam. Remainder of the study is stable.   Electronically Signed   By: Inez Catalina M.D.   On: 06/27/2014 07:55   Dg Chest Port 1 View  06/26/2014   CLINICAL DATA:  Check chest tube  EXAM: PORTABLE CHEST - 1 VIEW  COMPARISON:  06/25/2014  FINDINGS: Cardiac shadow is stable. The left lung remains clear. A right-sided central venous line is again noted. Two chest tubes are noted  on the right. A tiny right pneumothorax remains. Early infiltrate is noted in the right lung base.  IMPRESSION: Persistent small right-sided pneumothorax following chest tube removal  Early right basilar infiltrate.   Electronically Signed   By: Inez Catalina M.D.   On: 06/26/2014 08:00   Dg Chest Port 1 View  06/23/2014   CLINICAL DATA:  Evaluate pneumothorax  EXAM: PORTABLE CHEST - 1 VIEW  COMPARISON:  Prior chest x-ray 06/22/2014  FINDINGS: No definite residual apical pneumothorax. There is a tiny basilar pneumothorax in the right costophrenic angle. Chest tubes remain in good position. There are 3. Right IJ central venous catheter with the tip at the superior cavoatrial junction. Slightly decreased subcutaneous emphysema along the right lateral chest wall. Persistent right basilar airspace opacity consistent with a combination of atelectasis and infiltrate. Lower inspiratory volumes. Slightly increased subsegmental atelectasis in the left base and mid lung. a. Stable cardiac and mediastinal contours. Aortic  atherosclerosis. No acute osseous abnormality.  IMPRESSION: 1. Persistent small basilar pneumothorax in the right costophrenic angle. 2. Interval resolution of right apical pneumothorax. 3. Stable and satisfactory support apparatus. 4. Slightly lower inspiratory volumes with increased subsegmental atelectasis.   Electronically Signed   By: Jacqulynn Cadet M.D.   On: 06/23/2014 07:40   Dg Chest Port 1 View  06/22/2014   CLINICAL DATA:  Recent hydropneumothorax  EXAM: PORTABLE CHEST - 1 VIEW  COMPARISON:  Study obtained earlier in the day  FINDINGS: There are now 2 chest tubes on the right. There is currently a minimal right apical pneumothorax. There is moderate soft tissue air on the right. There has been essentially complete resolution of effusion on the right. There is patchy atelectasis in the right base.  The lungs are elsewhere clear except for the small nodular lesions noted previously. The heart size and pulmonary vascularity are normal. There is no a central catheter with the tip in the superior vena cava.  IMPRESSION: Only minimal pneumothorax remains in the right apex following chest tube placements on the right. There is right base atelectasis but no appreciable effusion. There is moderate subcutaneous air on the right. Central catheter tip in superior vena cava. Small nodular opacities remain bilaterally, unchanged.   Electronically Signed   By: Lowella Grip M.D.   On: 06/22/2014 17:01   Dg Chest Port 1v Same Day  06/24/2014   CLINICAL DATA:  Status post VATS with drainage of right pleural effusion and pleurodesis.  EXAM: PORTABLE CHEST - 1 VIEW SAME DAY  COMPARISON:  06/23/2014  FINDINGS: Right chest tube shows stable positioning. There is a 5% apical pneumothorax on the right. There is some increase in subcutaneous emphysema within the chest wall. Mild atelectasis remains in the right lower lung. No pulmonary edema. No significant pleural fluid. The heart size is normal.  IMPRESSION: 5%  right apical pneumothorax.   Electronically Signed   By: Aletta Edouard M.D.   On: 06/24/2014 11:01    ASSESSMENT AND PLAN: This is a very pleasant 63 years old white male with   1) metastatic adenocarcinoma of unknown primary questionable to be lung cancer versus upper gastrointestinal versus pancreatic. The patient is scheduled for upper endoscopy with endoscopic ultrasound by Dr. Ardis Hughs in few days. I am also awaiting the molecular studies before starting the patient on any systemic therapy. I gave the patient the option of proceeding with broad-spectrum chemotherapy versus waiting for the molecular studies and the gastrointestinal evaluation. He is currently mildly symptomatic and would  like to wait.  2) Suspicious left thyroid nodule: I ordered ultrasound guided fine needle aspiration of this lesion to rule out malignancy.  3) weight loss and malnutrition: I referred the patient to the dietitian at the [facility]. I also started him on Medrol Dosepak to stimulate his appetite  The patient would come back for followup visit in one week for reevaluation and more detailed discussion of his treatment options based on the pending studies. He was advised to call immediately if he has any concerning symptoms in the interval. The patient voices understanding of current disease status and treatment options and is in agreement with the current care plan.  All questions were answered. The patient knows to call the clinic with any problems, questions or concerns. We can certainly see the patient much sooner if necessary.  I spent 20 minutes counseling the patient face to face. The total time spent in the appointment was 30 minutes.  Disclaimer: This note was dictated with voice recognition software. Similar sounding words can inadvertently be transcribed and may not be corrected upon review.

## 2014-07-20 NOTE — Interval H&P Note (Signed)
History and Physical Interval Note:  07/20/2014 10:02 AM  Henry [name]  has presented today for surgery, with the diagnosis of Abnormal CT scan, pancreas or bile duct [793.3]  The various methods of treatment have been discussed with the patient and family. After consideration of risks, benefits and other options for treatment, the patient has consented to  Procedure(s): UPPER ENDOSCOPIC ULTRASOUND (EUS) LINEAR (N/A) as a surgical intervention .  The patient's history has been reviewed, patient examined, no change in status, stable for surgery.  I have reviewed the patient's chart and labs.  Questions were answered to the patient's satisfaction.     [name]

## 2014-07-20 NOTE — Telephone Encounter (Signed)
Pt scheduled to see Dr. Olevia Perches 08/04/14@2 :30pm. Appt letter mailed to pt.

## 2014-07-20 NOTE — Op Note (Signed)
[facility] [place], [address]   ENDOSCOPIC ULTRASOUND PROCEDURE REPORT  PATIENT: Henry [name], Henry [name]  MR#: [id number] BIRTHDATE: 11/01/1951  GENDER: Male ENDOSCOPIST: [name], MD REFERRED BY:  Curt Bears, M.D. 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 MEDICATIONS: MAC sedation, administered by CRNA  DESCRIPTION OF PROCEDURE:   After the risks benefits and alternatives of the procedure were  explained, informed consent was obtained. The patient was then placed in the left, lateral, decubitus postion and IV sedation was administered. Throughout the procedure, the patients blood pressure, pulse and oxygen saturations were monitored continuously.  Under direct visualization, the Pentax EUS Radial [id number]  endoscope was introduced through the mouth  and advanced to the bulb of duodenum .  Water was used as necessary to provide an acoustic interface. Upon completion of the imaging, water was removed and the patient was sent to the recovery room in satisfactory condition.  Endoscopic findings: 1. Candida esophagitis, mild 2. Tortuous duodenal bulb, unable to advance radial or linear echoendoscope past duodenal bulb.  EUS findings (radial and linear echoendoscopes): 1. The pancreas was again found to be diffusely abnormal: more hypoechoic than is usual, lobular outer margins, hyperchoic stranding within the parenchyma. There were no discrete pancreatic masses (solid or cystic). 2. Oblong, 1.4cm long axis, peripancreatic lymphnode was found (near body of pancreas). This was sampled with two transgastric passes with  25 gauge EUS FNA needle. 3. Main pancreatic duct was normal. 4. CBD was normal, non-dilated. 5. GB wall was somewhat thickened (2-24mm) but no discrete soft tissue lesions were noted. 6. Limited views of liver, spleen, portal and splenic vessels were all normal.  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 An oblong peripancreatid lymphnode was sampled with FNA, preliminary cytology review shows no sign of neoplasia (await final report).  Candida esophagitis, I will call in diflucan prescription. He asked for GI follow up office appointment for help with dyspepsia, dietary issues.  I will help expedite this.  _______________________________ eSigned:  [name], MD 07/20/2014 11:13 AM   cc: Delfin Edis, MD

## 2014-07-20 NOTE — Anesthesia Postprocedure Evaluation (Signed)
  Anesthesia Post-op Note  Patient: Henry [name]  Procedure(s) Performed: Procedure(s) (LRB): UPPER ENDOSCOPIC ULTRASOUND (EUS) LINEAR (N/A)  Patient Location: PACU  Anesthesia Type: MAC  Level of Consciousness: awake and alert   Airway and Oxygen Therapy: Patient Spontanous Breathing  Post-op Pain: mild  Post-op Assessment: Post-op Vital signs reviewed, Patient's Cardiovascular Status Stable, Respiratory Function Stable, Patent Airway and No signs of Nausea or vomiting  Last Vitals:  Filed Vitals:   07/20/14 1155  BP:   Pulse: 64  Temp:   Resp: 24    Post-op Vital Signs: stable   Complications: No apparent anesthesia complications

## 2014-07-20 NOTE — Transfer of Care (Signed)
Immediate Anesthesia Transfer of Care Note  Patient: Henry [name]  Procedure(s) Performed: Procedure(s): UPPER ENDOSCOPIC ULTRASOUND (EUS) LINEAR (N/A)  Patient Location: PACU  Anesthesia Type:MAC  Level of Consciousness: awake, sedated and patient cooperative  Airway & Oxygen Therapy: Patient Spontanous Breathing and Patient connected to face mask oxygen  Post-op Assessment: Report given to PACU RN and Post -op Vital signs reviewed and stable  Post vital signs: Reviewed and stable  Complications: No apparent anesthesia complications

## 2014-07-20 NOTE — Telephone Encounter (Signed)
Message copied by Algernon Huxley on Thu Jul 20, 2014  2:20 PM ------      Message from: Owens Loffler P      Created: Thu Jul 20, 2014 11:15 AM       Mohamed, EUS today, see below.  I'll let you know final cytology report but it's going to be negative.             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 An oblong peripancreatid lymphnode was sampled with FNA, preliminary cytology review shows no sign of neoplasia (await final report).  Candida esophagitis, I will call in diflucan prescription. He asked for GI follow up office appointment for help with dyspepsia, dietary issues.  I will help expedite this.                   Patty,      He needs ROV with Dr. Olevia Perches or extender in next few weeks.  THanks       ------

## 2014-07-20 NOTE — Anesthesia Preprocedure Evaluation (Addendum)
Anesthesia Evaluation  Patient identified by MRN, date of birth, ID band Patient awake    Reviewed: Allergy & Precautions, H&P , NPO status , Patient's Chart, lab work & pertinent test results  Airway Mallampati: II TM Distance: >3 FB Neck ROM: Full    Dental  (+) Dental Advisory Given,    Pulmonary shortness of breath and with exertion, Current Smoker,  SOB intermittent. breath sounds clear to auscultation  Pulmonary exam normal       Cardiovascular hypertension, Pt. on medications Rhythm:Regular Rate:Normal  ECG normal.  Office visit Dr. Roxan Hockey 07-18-14 reviewed. Adenocarcinoma lung.   Neuro/Psych negative neurological ROS  negative psych ROS   GI/Hepatic Neg liver ROS, GERD-  ,  Endo/Other  negative endocrine ROS  Renal/GU negative Renal ROS  negative genitourinary   Musculoskeletal  (+) Arthritis -,   Abdominal   Peds negative pediatric ROS (+)  Hematology negative hematology ROS (+)   Anesthesia Other Findings   Reproductive/Obstetrics negative OB ROS                       Anesthesia Physical Anesthesia Plan  ASA: III  Anesthesia Plan: MAC   Post-op Pain Management:    Induction: Intravenous  Airway Management Planned:   Additional Equipment:   Intra-op Plan:   Post-operative Plan: Extubation in OR  Informed Consent: I have reviewed the patients History and Physical, chart, labs and discussed the procedure including the risks, benefits and alternatives for the proposed anesthesia with the patient or authorized representative who has indicated his/her understanding and acceptance.   Dental advisory given  Plan Discussed with: CRNA  Anesthesia Plan Comments:        Anesthesia Quick Evaluation

## 2014-07-21 ENCOUNTER — Encounter (HOSPITAL_COMMUNITY): Payer: Self-pay | Admitting: Gastroenterology

## 2014-07-25 ENCOUNTER — Ambulatory Visit
Admission: RE | Admit: 2014-07-25 | Discharge: 2014-07-25 | Disposition: A | Discharge status: Home / Self Care | Payer: BC Managed Care – PPO | Source: Ambulatory Visit | Attending: Internal Medicine | Admitting: Internal Medicine

## 2014-07-25 ENCOUNTER — Telehealth: Payer: Self-pay | Admitting: Gastroenterology

## 2014-07-25 ENCOUNTER — Other Ambulatory Visit (HOSPITAL_COMMUNITY)
Admission: RE | Admit: 2014-07-25 | Discharge: 2014-07-25 | Disposition: A | Discharge status: Home / Self Care | Payer: BC Managed Care – PPO | Source: Ambulatory Visit | Attending: Interventional Radiology | Admitting: Interventional Radiology

## 2014-07-25 ENCOUNTER — Ambulatory Visit: Payer: BC Managed Care – PPO | Admitting: Internal Medicine

## 2014-07-25 DIAGNOSIS — C73 Malignant neoplasm of thyroid gland: Secondary | ICD-10-CM | POA: Insufficient documentation

## 2014-07-25 DIAGNOSIS — E041 Nontoxic single thyroid nodule: Secondary | ICD-10-CM

## 2014-07-25 DIAGNOSIS — E079 Disorder of thyroid, unspecified: Secondary | ICD-10-CM | POA: Diagnosis present

## 2014-07-25 DIAGNOSIS — C3412 Malignant neoplasm of upper lobe, left bronchus or lung: Secondary | ICD-10-CM

## 2014-07-25 HISTORY — DX: Malignant neoplasm of thyroid gland: C73

## 2014-07-25 MED ORDER — PANCRELIPASE (LIP-PROT-AMYL) 20880-78300 UNITS PO TABS: 2.0000 | ORAL_TABLET | Freq: Three times a day (TID) | ORAL | Status: DC

## 2014-07-25 NOTE — Telephone Encounter (Signed)
Henry [name] called in as a trial for his chronic pancreatitis, dyspepsia

## 2014-07-27 ENCOUNTER — Encounter: Payer: Self-pay | Admitting: Internal Medicine

## 2014-07-27 ENCOUNTER — Telehealth: Payer: Self-pay | Admitting: Internal Medicine

## 2014-07-27 ENCOUNTER — Encounter: Payer: Self-pay | Admitting: *Deleted

## 2014-07-27 ENCOUNTER — Other Ambulatory Visit (HOSPITAL_BASED_OUTPATIENT_CLINIC_OR_DEPARTMENT_OTHER): Payer: BC Managed Care – PPO

## 2014-07-27 ENCOUNTER — Ambulatory Visit (HOSPITAL_BASED_OUTPATIENT_CLINIC_OR_DEPARTMENT_OTHER): Payer: BC Managed Care – PPO | Admitting: Internal Medicine

## 2014-07-27 ENCOUNTER — Encounter: Payer: BC Managed Care – PPO | Admitting: Nutrition

## 2014-07-27 VITALS — BP 112/76 | HR 68 | Temp 98.7°F | Resp 18 | Ht 72.0 in | Wt 145.3 lb

## 2014-07-27 DIAGNOSIS — C73 Malignant neoplasm of thyroid gland: Secondary | ICD-10-CM

## 2014-07-27 DIAGNOSIS — R634 Abnormal weight loss: Secondary | ICD-10-CM

## 2014-07-27 DIAGNOSIS — C3431 Malignant neoplasm of lower lobe, right bronchus or lung: Secondary | ICD-10-CM

## 2014-07-27 DIAGNOSIS — C801 Malignant (primary) neoplasm, unspecified: Secondary | ICD-10-CM

## 2014-07-27 DIAGNOSIS — C3412 Malignant neoplasm of upper lobe, left bronchus or lung: Secondary | ICD-10-CM

## 2014-07-27 DIAGNOSIS — E041 Nontoxic single thyroid nodule: Secondary | ICD-10-CM

## 2014-07-27 DIAGNOSIS — E46 Unspecified protein-calorie malnutrition: Secondary | ICD-10-CM

## 2014-07-27 LAB — COMPREHENSIVE METABOLIC PANEL (CC13)
ALT: 17 U/L (ref 0–55)
ANION GAP: 8 meq/L (ref 3–11)
AST: 15 U/L (ref 5–34)
Albumin: 3.7 g/dL (ref 3.5–5.0)
Alkaline Phosphatase: 69 U/L (ref 40–150)
BILIRUBIN TOTAL: 1.65 mg/dL — AB (ref 0.20–1.20)
BUN: 12 mg/dL (ref 7.0–26.0)
CALCIUM: 9.1 mg/dL (ref 8.4–10.4)
CHLORIDE: 106 meq/L (ref 98–109)
CO2: 28 mEq/L (ref 22–29)
CREATININE: 0.8 mg/dL (ref 0.7–1.3)
GLUCOSE: 120 mg/dL (ref 70–140)
Potassium: 4.1 mEq/L (ref 3.5–5.1)
Sodium: 141 mEq/L (ref 136–145)
Total Protein: 6.3 g/dL — ABNORMAL LOW (ref 6.4–8.3)

## 2014-07-27 LAB — CBC WITH DIFFERENTIAL/PLATELET
BASO%: 0.9 % (ref 0.0–2.0)
BASOS ABS: 0.1 10*3/uL (ref 0.0–0.1)
EOS%: 2.4 % (ref 0.0–7.0)
Eosinophils Absolute: 0.2 10*3/uL (ref 0.0–0.5)
HEMATOCRIT: 47.1 % (ref 38.4–49.9)
HEMOGLOBIN: 15.9 g/dL (ref 13.0–17.1)
LYMPH#: 2 10*3/uL (ref 0.9–3.3)
LYMPH%: 22.2 % (ref 14.0–49.0)
MCH: 32.5 pg (ref 27.2–33.4)
MCHC: 33.8 g/dL (ref 32.0–36.0)
MCV: 96.1 fL (ref 79.3–98.0)
MONO#: 1.1 10*3/uL — AB (ref 0.1–0.9)
MONO%: 12.2 % (ref 0.0–14.0)
NEUT#: 5.5 10*3/uL (ref 1.5–6.5)
NEUT%: 62.3 % (ref 39.0–75.0)
PLATELETS: 209 10*3/uL (ref 140–400)
RBC: 4.9 10*6/uL (ref 4.20–5.82)
RDW: 14.5 % (ref 11.0–14.6)
WBC: 8.8 10*3/uL (ref 4.0–10.3)

## 2014-07-27 MED ORDER — FOLIC ACID 1 MG PO TABS: 1.0000 mg | ORAL_TABLET | Freq: Every day | ORAL | Status: DC

## 2014-07-27 MED ORDER — CYANOCOBALAMIN 1000 MCG/ML IJ SOLN
1000.0000 ug | Freq: Once | INTRAMUSCULAR | Status: AC
  Administered 2014-07-27: 1000 ug via INTRAMUSCULAR

## 2014-07-27 MED ORDER — PROCHLORPERAZINE MALEATE 10 MG PO TABS: 10.0000 mg | ORAL_TABLET | Freq: Four times a day (QID) | ORAL | Status: DC | PRN

## 2014-07-27 MED ORDER — DEXAMETHASONE 4 MG PO TABS: ORAL_TABLET | ORAL | Status: DC

## 2014-07-27 MED ORDER — CYANOCOBALAMIN 1000 MCG/ML IJ SOLN
INTRAMUSCULAR | Status: AC
  Filled 2014-07-27: qty 1

## 2014-07-27 NOTE — Telephone Encounter (Signed)
gv adn printed appt sched and avs for pt for SEpt adn OCT....sed added tx.

## 2014-07-27 NOTE — Progress Notes (Signed)
[facility] [phone]   Fax:([phone]) [place], [address] [place] [address]  DIAGNOSIS: Metastatic adenocarcinoma of unknown primary but questionable for upper gastrointestinal, pancreaticobiliary or primary lung cancer, diagnosed in August of 2015.  Genomic Alterations Identified? FGFR2 A97T KRAS Q61H - subclonal? ARID1A [id number]* - subclonal?  PRIOR THERAPY: Status post right video-assisted thoracoscopy, drainage of pleural effusion, pleural biopsy, partial decortication, talc pleurodesis.under the care of Dr. Roxan Hockey on 06/23/2014.  CURRENT THERAPY:   INTERVAL HISTORY: Henry [name] 63 y.o. male returns to the clinic today for followup visit. The patient is feeling fine today with no specific complaints. He underwent surgical procedures and studies last week including endoscopic ultrasound under the care of Dr. Ardis Hughs and biopsy that revealed no malignancy in the pancreatic and no significant findings in the esophagus or stomach. 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 The molecular biomarker studies are still pending at the time of the visit but became available at the time of this dictation. The patient denied having any other significant complaints today except for the weight loss t which is currently stable. He has no nausea or vomiting, no fever or chills. He lost few pounds since his last visit. He still concerned about his weight loss. He is here today for evaluation and discussion of his treatment options.  MEDICAL HISTORY: Past Medical History  Diagnosis Date  . Hypertension   . GERD (gastroesophageal reflux disease)   . Hemochromatosis   . Hx of adenomatous colonic polyps   . Umbilical hernia     none since weight loss  . Diverticulosis   . Lumbar spondylosis       ankle  . Pleural effusion on right 12/16/13    per chest xray  . Shortness of breath     with  exertion    ALLERGIES:  has No Known Allergies.  MEDICATIONS:  Current Outpatient Prescriptions  Medication Sig Dispense Refill  . albuterol (PROVENTIL HFA;VENTOLIN HFA) 108 (90 BASE) MCG/ACT inhaler Inhale 1-2 puffs into the lungs every 4 (four) hours as needed for wheezing or shortness of breath.       . diltiazem (CARDIZEM CD) 360 MG 24 hr capsule Take 360 mg by mouth every morning.       . fluconazole (DIFLUCAN) 100 MG tablet Take 1 tablet (100 mg total) by mouth daily.  10 tablet  0  . hydrochlorothiazide (HYDRODIURIL) 25 MG tablet Take 25 mg by mouth every evening.       Marland Kitchen ibuprofen (ADVIL,MOTRIN) 200 MG tablet Take 400 mg by mouth every 6 (six) hours as needed (Pain).      . methylPREDNIsolone (MEDROL DOSPACK) 4 MG tablet follow package directions  21 tablet  0  . nebivolol (BYSTOLIC) 5 MG tablet Take 5 mg by mouth every evening.       Marland Kitchen oxycodone (OXY-IR) 5 MG capsule Take 1 capsule (5 mg total) by mouth every 4 (four) hours as needed.  30 capsule  0  . Pancrelipase, Lip-Prot-Amyl, 20880 UNITS TABS Take 2 tablets by mouth 3 (three) times daily with meals.  540 tablet  2  . potassium chloride SA (K-DUR,KLOR-CON) 20 MEQ tablet Take 40 mEq by mouth daily.       . Probiotic Product (ALIGN) 4 MG CAPS Take 4 mg by mouth daily.       Marland Kitchen  VOLTAREN 1 % GEL Apply 2 g topically 3 (three) times a week. Used on ankle       No current facility-administered medications for this visit.    SURGICAL HISTORY:  Past Surgical History  Procedure Laterality Date  . Lumbar laminectomy  1983  . Knee arthrsoscopy  2003    left  . Tonsillectomy  1962  . Eus N/A 04/07/2013    Procedure: UPPER ENDOSCOPIC ULTRASOUND (EUS) LINEAR;  Surgeon: [name], MD;  Location: [facility];  Service: Endoscopy;  Laterality: N/A;  . Video assisted thoracoscopy (vats)/decortication Right 06/22/2014    Procedure:  VIDEO ASSISTED THORACOSCOPY (VATS)/DECORTICATION;  Surgeon: [name], MD;  Location: [facility];  Service: Thoracic;  Laterality: Right;  . Pleural effusion drainage Right 06/22/2014    Procedure: DRAINAGE OF PLEURAL EFFUSION;  Surgeon: [name], MD;  Location: [facility];  Service: Thoracic;  Laterality: Right;  . Eus N/A 07/20/2014    Procedure: UPPER ENDOSCOPIC ULTRASOUND (EUS) LINEAR;  Surgeon: [name], MD;  Location: [facility];  Service: Endoscopy;  Laterality: N/A;    REVIEW OF SYSTEMS:  Constitutional: positive for fatigue and weight loss Eyes: negative Ears, nose, mouth, throat, and face: negative Respiratory: positive for dyspnea on exertion Cardiovascular: negative Gastrointestinal: negative Genitourinary:negative Integument/breast: negative Hematologic/lymphatic: negative Musculoskeletal:negative Neurological: negative Behavioral/Psych: negative Endocrine: negative Allergic/Immunologic: negative   PHYSICAL EXAMINATION: General appearance: alert, cooperative, fatigued and no distress Head: Normocephalic, without obvious abnormality, atraumatic Neck: no adenopathy, no JVD, supple, symmetrical, trachea midline and thyroid not enlarged, symmetric, no tenderness/mass/nodules Lymph nodes: Cervical, supraclavicular, and axillary nodes normal. Resp: clear to auscultation bilaterally Back: symmetric, no curvature. ROM normal. No CVA tenderness. Cardio: regular rate and rhythm, S1, S2 normal, no murmur, click, rub or gallop GI: soft, non-tender; bowel sounds normal; no masses,  no organomegaly Extremities: extremities normal, atraumatic, no cyanosis or edema Neurologic: Alert and oriented X 3, normal strength and tone. Normal symmetric reflexes. Normal coordination and gait  ECOG PERFORMANCE STATUS: 1 - Symptomatic but completely ambulatory  There were no vitals taken for this visit.  LABORATORY DATA: Lab Results  Component Value Date   WBC 8.8 07/27/2014    HGB 15.9 07/27/2014   HCT 47.1 07/27/2014   MCV 96.1 07/27/2014   PLT 209 07/27/2014      Chemistry      Component Value Date/Time   NA 141 06/29/2014 1505   NA 140 06/24/2014 0346   K 3.8 06/29/2014 1505   K 3.3* 06/24/2014 0346   CL 102 06/24/2014 0346   CO2 29 06/29/2014 1505   CO2 28 06/24/2014 0346   BUN 14.4 06/29/2014 1505   BUN 11 06/24/2014 0346   CREATININE 0.7 06/29/2014 1505   CREATININE 0.75 06/24/2014 0346      Component Value Date/Time   CALCIUM 9.2 06/29/2014 1505   CALCIUM 8.6 06/24/2014 0346   ALKPHOS 60 06/29/2014 1505   ALKPHOS 54 06/24/2014 0346   AST 21 06/29/2014 1505   AST 13 06/24/2014 0346   ALT 21 06/29/2014 1505   ALT 11 06/24/2014 0346   BILITOT 0.75 06/29/2014 1505   BILITOT 0.6 06/24/2014 0346       RADIOGRAPHIC STUDIES: Dg Chest 2 View  07/18/2014   CLINICAL DATA:  Two weeks status post thoracentesis on the right  EXAM: CHEST  2 VIEW  COMPARISON:  PA and lateral chest x-ray of June 28, 2014  FINDINGS: There is a tiny stable right apical pneumothorax. A pleural line  is visible between the third and fourth posterior ribs. There is a small amount of pleural fluid remaining. The subcutaneous emphysema has cleared. Parenchymal densities are present peripherally in the right lung especially at the lung base. The left lung is well-expanded and clear. The mediastinum is not shifted. The cardiac silhouette is normal in size. The pulmonary vascularity is normal. The right internal jugular venous catheter is been removed.  IMPRESSION: There stable appearance of the small hydro pneumothorax on the right. Persistent patchy parenchymal density peripherally in the right upper and mid and lower lung is demonstrated, more conspicuous at the lung base today.   Electronically Signed   By: David  Martinique   On: 07/18/2014 14:50   Mr Jeri Cos Wo Contrast  07/04/2014   CLINICAL DATA:  63 year old male with metastatic adenocarcinoma status post partial decortication and pleurodesis. Staging.  Subsequent encounter.  EXAM: MRI HEAD WITHOUT AND WITH CONTRAST  TECHNIQUE: Multiplanar, multiecho pulse sequences of the brain and surrounding structures were obtained without and with intravenous contrast.  CONTRAST:  63m MULTIHANCE GADOBENATE DIMEGLUMINE 529 MG/ML IV SOLN  COMPARISON:  PET-CT 06/30/2014.  FINDINGS: Some post-contrast images suggest subtle smooth pachymeningeal or increased leptomeningeal enhancement (e.g. Series 11, image 28 and series 13, image 10), but there is no definite abnormal meningeal thickening or enhancement. No enhancing brain lesion identified. No midline shift, mass effect, or evidence of intracranial mass lesion.  No restricted diffusion to suggest acute infarction. No ventriculomegaly, extra-axial collection or acute intracranial hemorrhage. Cervicomedullary junction and pituitary are within normal limits. Cerebral volume is within normal limits for age. Major intracranial vascular flow voids are preserved. GPearline Cablesand white matter signal is within normal limits for age throughout the brain.  Visible internal auditory structures appear normal. Mastoids are clear. Scattered paranasal sinus mucosal thickening, inspissated secretions in the left frontal sinus. Mild Visualized orbit soft tissues are within normal limits. Visualized scalp soft tissues are within normal limits. Visualized bone marrow signal is within normal limits.  IMPRESSION: No acute or metastatic intracranial abnormality.   Electronically Signed   By: LLars PinksM.D.   On: 07/04/2014 19:33   UKoreaSoft Tissue Head/neck  07/25/2014   CLINICAL DATA:  Thyroid nodule on PET scan  EXAM: THYROID ULTRASOUND  TECHNIQUE: Ultrasound examination of the thyroid gland and adjacent soft tissues was performed.  COMPARISON:  06/30/2014  FINDINGS: Right thyroid lobe  Measurements: 5.0 x 2.0 x 1.3 cm. Normal echogenicity. No focal abnormality. Incidental 2 mm hypoechoic foci in the mid lower pole regions. No significant nodule or mass.   Left thyroid lobe  Measurements: 4.1 x 2.0 x 1.5 cm. Dominant left upper pole hypoechoic solid mass with microcalcifications measures 1.7 x 1.3 x 1.3 cm. This correlates with the PET positive nodule and warrants biopsy. Incidental well-circumscribed echogenic 9 mm nodule noted in the lower pole as well.  Isthmus  Thickness: 3 mm.  No nodules visualized.  Lymphadenopathy  None visualized.  IMPRESSION: Dominant 1.7 cm left upper pole thyroid nodule with microcalcifications correlates with the PET positive nodule. Recommend FNA biopsy.  Additional incidental 9 mm left lower pole nodule. This warrants followup in 1 year.   Electronically Signed   By: TDaryll BrodM.D.   On: 07/25/2014 17:39   Nm Pet Image Initial (pi) Skull Base To Thigh  06/30/2014   CLINICAL DATA:  Initial treatment strategy for metastatic lung cancer (adenocarcinoma). Patient underwent VATS with partial decortication and talc pleurodesis 06/22/2014.  EXAM: NUCLEAR MEDICINE PET  SKULL BASE TO THIGH  TECHNIQUE: 7.44 mCi F-18 FDG was injected intravenously. Full-ring PET imaging was performed from the skull base to thigh after the radiotracer. CT data was obtained and used for attenuation correction and anatomic localization.  FASTING BLOOD GLUCOSE:  Value: 95 mg/dl  COMPARISON:  Chest CT 06/21/2014  FINDINGS: NECK  No hypermetabolic cervical lymph nodes are identified.There are no lesions of the pharyngeal mucosal space. There is a focal hypermetabolic nodule involving the inferior left thyroid lobe. This measures approximately 1.5 cm and has an SUV max of 9.6.  CHEST  There are no hypermetabolic mediastinal, hilar or axillary lymph nodes. There is a persistent moderate size right hydro pneumothorax with partial lung collapse. Multiple air bubbles have developed within the posterior inferior pleural space. There is high density within the pleural space consistent with interval pleurodesis. There is hypermetabolic activity throughout the right  pleural space corresponding with known metastatic disease. This has an SUV max of 4.3. The ill-defined subpleural nodularity in both lungs is also mildly hypermetabolic. No dominant lung mass or endobronchial lesion identified. There is soft tissue emphysema within the right chest wall laterally.  ABDOMEN/PELVIS  There is no hypermetabolic activity within the liver, adrenal glands, spleen or pancreas. There is no hypermetabolic nodal activity. Hepatic cysts and mild atherosclerosis noted. The prostate gland is moderately enlarged.  SKELETON  There is no hypermetabolic activity to suggest osseous metastatic disease.  IMPRESSION: 1. There is hypermetabolic activity throughout the right pleural space status post recent pleural decortication and talc pleurodesis for a malignant pleural effusion. 2. No dominant hypermetabolic lung mass or mediastinal nodal disease demonstrated. The patchy subpleural nodular densities in both lungs demonstrate nonspecific low level hypermetabolic activity. This could be inflammatory or neoplastic. 3. No distant metastases identified. 4. Hypermetabolic left thyroid nodule. Hypermetabolic thyroid nodules on PET have up to 40-50% incidence of malignancy; recommend further evaluation with thyroid ultrasound and possible US-guided fine needle aspiration.   Electronically Signed   By: Camie Patience M.D.   On: 06/30/2014 14:56   US Thyroid Biopsy  07/25/2014   CLINICAL DATA:  Left thyroid nodule by PET scan  EXAM: ULTRASOUND GUIDED NEEDLE ASPIRATE BIOPSY OF THE THYROID GLAND  COMPARISON:  06/30/2014  PROCEDURE: Thyroid biopsy was thoroughly discussed with the patient and questions were answered. The benefits, risks, alternatives, and complications were also discussed. The patient understands and wishes to proceed with the procedure. Written consent was obtained.  Ultrasound was performed to localize and mark an adequate site for the biopsy. The patient was then prepped and draped in a normal  sterile fashion. Local anesthesia was provided with 1% lidocaine. Using direct ultrasound guidance, 4 passes were made using needles into the nodule within the left lobe of the thyroid. Ultrasound was used to confirm needle placements on all occasions. Specimens were sent to Pathology for analysis.  Complications:  No immediate  FINDINGS: Imaging confirms needle placement in the dominant left thyroid nodule containing microcalcifications  IMPRESSION: Ultrasound guided needle aspirate biopsy performed of the dominant left thyroid nodule.   Electronically Signed   By: Daryll Brod M.D.   On: 07/25/2014 16:53   ASSESSMENT AND PLAN: This is a very pleasant 63 years old white male with   1) metastatic adenocarcinoma of unknown primary questionable to be lung cancer versus hepatobiliary. The molecular studies that became available after the visit showed no EGFR mutation or ALK gene translocation. The gastrointestinal workup performed by Dr. Yates Decamp was unremarkable for any malignancy  and the esophagus, stomach or pancreas. I recommended for the patient treatment with systemic chemotherapy with carboplatin for AUC of 5 and Alimta 500 mg/M2 every 3 weeks. I discussed with the patient adverse effect of the chemotherapy including but not limited to alopecia, myelosuppression, nausea and vomiting, peripheral neuropathy, liver or renal dysfunction. The patient would like to proceed with the chemotherapy today as scheduled. I will arrange for him to have a B12 injection today. I will also arrange for the patient to have a chemotherapy education class before starting the first cycle of his chemotherapy. I will call his pharmacy with prescription for Compazine 10 mg by mouth every 6 hours as needed for nausea, folic acid 1 mg by mouth daily in addition to Decadron 4 mg by mouth twice a day the day before, day of and day after the chemotherapy every 3 weeks. The patient would come back for followup visit in 2 weeks  for reevaluation and management any adverse effect of his treatment.  2) left thyroid nodule papillary thyroid carcinoma: I will refer the patient to Gen. surgery for evaluation and consideration of resection of this lesion, which can be performed after completion of cycles of his chemotherapy for the metastatic adenocarcinoma.  3) weight loss and malnutrition: He was referred to the dietitian at the [facility].   The patient would come back for followup visit in one week for reevaluation and more detailed discussion of his treatment options based on the pending studies. He was advised to call immediately if he has any concerning symptoms in the interval. The patient voices understanding of current disease status and treatment options and is in agreement with the current care plan.  All questions were answered. The patient knows to call the clinic with any problems, questions or concerns. We can certainly see the patient much sooner if necessary.  I spent 25 minutes counseling the patient face to face. The total time spent in the appointment was 35 minutes.  Disclaimer: This note was dictated with voice recognition software. Similar sounding words can inadvertently be transcribed and may not be corrected upon review.

## 2014-07-27 NOTE — Progress Notes (Signed)
[facility] per Dr. Worthy Flank request.  I called [facility].  They stated, results will not be available until 07/31/14.

## 2014-07-27 NOTE — Patient Instructions (Signed)
Smoking Cessation, Tips for Success  If you are ready to quit smoking, congratulations! You have chosen to help yourself be healthier. Cigarettes bring nicotine, tar, carbon monoxide, and other irritants into your body. Your lungs, heart, and blood vessels will be able to work better without these poisons. There are many different ways to quit smoking. Nicotine gum, nicotine patches, a nicotine inhaler, or nicotine nasal spray can help with physical craving. Hypnosis, support groups, and medicines help break the habit of smoking.  WHAT THINGS CAN I DO TO MAKE QUITTING EASIER?   Here are some tips to help you quit for good:  · Pick a date when you will quit smoking completely. Tell all of your friends and family about your plan to quit on that date.  · Do not try to slowly cut down on the number of cigarettes you are smoking. Pick a quit date and quit smoking completely starting on that day.  · Throw away all cigarettes.    · Clean and remove all ashtrays from your home, work, and car.  · On a card, write down your reasons for quitting. Carry the card with you and read it when you get the urge to smoke.  · Cleanse your body of nicotine. Drink enough water and fluids to keep your urine clear or pale yellow. Do this after quitting to flush the nicotine from your body.  · Learn to predict your moods. Do not let a bad situation be your excuse to have a cigarette. Some situations in your life might tempt you into wanting a cigarette.  · Never have "just one" cigarette. It leads to wanting another and another. Remind yourself of your decision to quit.  · Change habits associated with smoking. If you smoked while driving or when feeling stressed, try other activities to replace smoking. Stand up when drinking your coffee. Brush your teeth after eating. Sit in a different chair when you read the paper. Avoid alcohol while trying to quit, and try to drink fewer caffeinated beverages. Alcohol and caffeine may urge you to  smoke.  · Avoid foods and drinks that can trigger a desire to smoke, such as sugary or spicy foods and alcohol.  · Ask people who smoke not to smoke around you.  · Have something planned to do right after eating or having a cup of coffee. For example, plan to take a walk or exercise.  · Try a relaxation exercise to calm you down and decrease your stress. Remember, you may be tense and nervous for the first 2 weeks after you quit, but this will pass.  · Find new activities to keep your hands busy. Play with a pen, coin, or rubber band. Doodle or draw things on paper.  · Brush your teeth right after eating. This will help cut down on the craving for the taste of tobacco after meals. You can also try mouthwash.    · Use oral substitutes in place of cigarettes. Try using lemon drops, carrots, cinnamon sticks, or chewing gum. Keep them handy so they are available when you have the urge to smoke.  · When you have the urge to smoke, try deep breathing.  · Designate your home as a nonsmoking area.  · If you are a heavy smoker, ask your health care provider about a prescription for nicotine chewing gum. It can ease your withdrawal from nicotine.  · Reward yourself. Set aside the cigarette money you save and buy yourself something nice.  · Look for   support from others. Join a support group or smoking cessation program. Ask someone at home or at work to help you with your plan to quit smoking.  · Always ask yourself, "Do I need this cigarette or is this just a reflex?" Tell yourself, "Today, I choose not to smoke," or "I do not want to smoke." You are reminding yourself of your decision to quit.  · Do not replace cigarette smoking with electronic cigarettes (commonly called e-cigarettes). The safety of e-cigarettes is unknown, and some may contain harmful chemicals.  · If you relapse, do not give up! Plan ahead and think about what you will do the next time you get the urge to smoke.  HOW WILL I FEEL WHEN I QUIT SMOKING?  You  may have symptoms of withdrawal because your body is used to nicotine (the addictive substance in cigarettes). You may crave cigarettes, be irritable, feel very hungry, cough often, get headaches, or have difficulty concentrating. The withdrawal symptoms are only temporary. They are strongest when you first quit but will go away within 10-14 days. When withdrawal symptoms occur, stay in control. Think about your reasons for quitting. Remind yourself that these are signs that your body is healing and getting used to being without cigarettes. Remember that withdrawal symptoms are easier to treat than the major diseases that smoking can cause.   Even after the withdrawal is over, expect periodic urges to smoke. However, these cravings are generally short lived and will go away whether you smoke or not. Do not smoke!  WHAT RESOURCES ARE AVAILABLE TO HELP ME QUIT SMOKING?  Your health care provider can direct you to community resources or hospitals for support, which may include:  · Group support.  · Education.  · Hypnosis.  · Therapy.  Document Released: 08/01/2004 Document Revised: 03/20/2014 Document Reviewed: 04/21/2013  [employer]® Patient Information ©2015 [employer]. This information is not intended to replace advice given to you by your health care provider. Make sure you discuss any questions you have with your health care provider.

## 2014-07-31 ENCOUNTER — Other Ambulatory Visit: Payer: BC Managed Care – PPO

## 2014-07-31 ENCOUNTER — Ambulatory Visit: Payer: BC Managed Care – PPO | Admitting: Nutrition

## 2014-07-31 NOTE — Progress Notes (Addendum)
63 year old male diagnosed with metastatic cancer with an unknown primary in August 2015.  Past medical history includes hypertension, GERD, hemachromatosis, diverticulosis, pleural effusion, shortness of breath, tobacco, and alcohol intake.  Medications include Decadron, Folvite, Medrol Dosepak, pancrelipase, align, and Compazine.  Labs include albumin 3.7 on September 10.  Height: 6 feet 0 inches. Weight: 145.3 pounds. Usual body weight: 200 pounds in 2014. BMI: 19.7.  Patient recently assessed by inpatient RD after pleural effusion.  Patient diagnosed with severe malnutrition in the context of chronic illness. Patient is positive for 27% weight loss from usual body weight.  Patient follows a gluten-free, lactose-free diet to" help" with GI symptoms.  Patient also reports he is sensitive to pork and peanuts. He can tolerate some sausage.  He does not tolerate ice cream.  He does not tolerate some of the lactose-free products (lactaid milk) which results in diarrhea and gas.     Nutrition diagnosis: Inadequate oral intake related to limited food acceptance as evidenced by 27% weight loss.  Intervention: Recommended patient consume smaller, more frequent meals and snacks throughout the day utilizing foods that he tolerates well.   Reviewed, high-protein foods sources with patient.  I provided fact sheets.   Recommended a gluten-free, and lactose-free options.        Provided samples of oral nutrition supplements. Questions were answered.  Teach back method used.  Monitoring, evaluation, goals:   Patient will tolerate adequate calories and protein to minimize further weight loss.  Next visit: Wednesday October 7, during chemotherapy.       **Disclaimer: This note was dictated with voice recognition software. Similar sounding words can inadvertently be transcribed and this note may contain transcription errors which may not have been corrected upon publication of note.**

## 2014-08-01 ENCOUNTER — Encounter (HOSPITAL_COMMUNITY): Payer: Self-pay

## 2014-08-02 ENCOUNTER — Other Ambulatory Visit: Payer: BC Managed Care – PPO

## 2014-08-02 ENCOUNTER — Other Ambulatory Visit (HOSPITAL_BASED_OUTPATIENT_CLINIC_OR_DEPARTMENT_OTHER): Payer: BC Managed Care – PPO

## 2014-08-02 ENCOUNTER — Ambulatory Visit (HOSPITAL_BASED_OUTPATIENT_CLINIC_OR_DEPARTMENT_OTHER): Payer: BC Managed Care – PPO

## 2014-08-02 VITALS — BP 137/88 | HR 73 | Temp 98.6°F | Resp 20

## 2014-08-02 DIAGNOSIS — C3431 Malignant neoplasm of lower lobe, right bronchus or lung: Secondary | ICD-10-CM

## 2014-08-02 DIAGNOSIS — Z5111 Encounter for antineoplastic chemotherapy: Secondary | ICD-10-CM

## 2014-08-02 DIAGNOSIS — C801 Malignant (primary) neoplasm, unspecified: Secondary | ICD-10-CM

## 2014-08-02 LAB — CBC WITH DIFFERENTIAL/PLATELET
BASO%: 0.2 % (ref 0.0–2.0)
BASOS ABS: 0 10*3/uL (ref 0.0–0.1)
EOS%: 0.2 % (ref 0.0–7.0)
Eosinophils Absolute: 0 10*3/uL (ref 0.0–0.5)
HEMATOCRIT: 47.8 % (ref 38.4–49.9)
HEMOGLOBIN: 16 g/dL (ref 13.0–17.1)
LYMPH#: 1.2 10*3/uL (ref 0.9–3.3)
LYMPH%: 8.2 % — ABNORMAL LOW (ref 14.0–49.0)
MCH: 32.2 pg (ref 27.2–33.4)
MCHC: 33.4 g/dL (ref 32.0–36.0)
MCV: 96.3 fL (ref 79.3–98.0)
MONO#: 1 10*3/uL — AB (ref 0.1–0.9)
MONO%: 7.1 % (ref 0.0–14.0)
NEUT#: 12.3 10*3/uL — ABNORMAL HIGH (ref 1.5–6.5)
NEUT%: 84.3 % — AB (ref 39.0–75.0)
Platelets: 253 10*3/uL (ref 140–400)
RBC: 4.97 10*6/uL (ref 4.20–5.82)
RDW: 14.7 % — ABNORMAL HIGH (ref 11.0–14.6)
WBC: 14.6 10*3/uL — AB (ref 4.0–10.3)

## 2014-08-02 LAB — COMPREHENSIVE METABOLIC PANEL (CC13)
ALT: 11 U/L (ref 0–55)
ANION GAP: 10 meq/L (ref 3–11)
AST: 13 U/L (ref 5–34)
Albumin: 3.7 g/dL (ref 3.5–5.0)
Alkaline Phosphatase: 72 U/L (ref 40–150)
BUN: 13.5 mg/dL (ref 7.0–26.0)
CALCIUM: 9.7 mg/dL (ref 8.4–10.4)
CO2: 25 meq/L (ref 22–29)
CREATININE: 0.8 mg/dL (ref 0.7–1.3)
Chloride: 103 mEq/L (ref 98–109)
GLUCOSE: 118 mg/dL (ref 70–140)
Potassium: 3.4 mEq/L — ABNORMAL LOW (ref 3.5–5.1)
Sodium: 139 mEq/L (ref 136–145)
Total Bilirubin: 1 mg/dL (ref 0.20–1.20)
Total Protein: 6.9 g/dL (ref 6.4–8.3)

## 2014-08-02 MED ORDER — SODIUM CHLORIDE 0.9 % IV SOLN
570.0000 mg | Freq: Once | INTRAVENOUS | Status: AC
  Administered 2014-08-02: 570 mg via INTRAVENOUS
  Filled 2014-08-02: qty 57

## 2014-08-02 MED ORDER — SODIUM CHLORIDE 0.9 % IV SOLN
500.0000 mg/m2 | Freq: Once | INTRAVENOUS | Status: AC
  Administered 2014-08-02: 925 mg via INTRAVENOUS
  Filled 2014-08-02: qty 37

## 2014-08-02 MED ORDER — ONDANSETRON 16 MG/50ML IVPB (CHCC)
16.0000 mg | Freq: Once | INTRAVENOUS | Status: AC
  Administered 2014-08-02: 16 mg via INTRAVENOUS

## 2014-08-02 MED ORDER — ONDANSETRON 16 MG/50ML IVPB (CHCC)
INTRAVENOUS | Status: AC
  Filled 2014-08-02: qty 16

## 2014-08-02 MED ORDER — DEXAMETHASONE SODIUM PHOSPHATE 20 MG/5ML IJ SOLN
20.0000 mg | Freq: Once | INTRAMUSCULAR | Status: AC
  Administered 2014-08-02: 20 mg via INTRAVENOUS

## 2014-08-02 MED ORDER — DEXAMETHASONE SODIUM PHOSPHATE 20 MG/5ML IJ SOLN
INTRAMUSCULAR | Status: AC
  Filled 2014-08-02: qty 5

## 2014-08-02 MED ORDER — SODIUM CHLORIDE 0.9 % IV SOLN
Freq: Once | INTRAVENOUS | Status: AC
  Administered 2014-08-02: 11:00:00 via INTRAVENOUS

## 2014-08-02 NOTE — Patient Instructions (Signed)
[facility] Discharge Instructions for Patients Receiving Chemotherapy  Today you received the following chemotherapy agents Alimta and Carboplatin.  To help prevent nausea and vomiting after your treatment, we encourage you to take your nausea medication as prescribed by your physician.   If you develop nausea and vomiting that is not controlled by your nausea medication, call the clinic.   BELOW ARE SYMPTOMS THAT SHOULD BE REPORTED IMMEDIATELY:  *FEVER GREATER THAN 100.5 F  *CHILLS WITH OR WITHOUT FEVER  NAUSEA AND VOMITING THAT IS NOT CONTROLLED WITH YOUR NAUSEA MEDICATION  *UNUSUAL SHORTNESS OF BREATH  *UNUSUAL BRUISING OR BLEEDING  TENDERNESS IN MOUTH AND THROAT WITH OR WITHOUT PRESENCE OF ULCERS  *URINARY PROBLEMS  *BOWEL PROBLEMS  UNUSUAL RASH Items with * indicate a potential emergency and should be followed up as soon as possible.  Feel free to call the clinic you have any questions or concerns. The clinic phone number is [phone].

## 2014-08-03 ENCOUNTER — Encounter: Payer: Self-pay | Admitting: *Deleted

## 2014-08-03 ENCOUNTER — Telehealth: Payer: Self-pay | Admitting: *Deleted

## 2014-08-03 NOTE — Telephone Encounter (Signed)
Left message for pt to return my call.

## 2014-08-03 NOTE — Telephone Encounter (Signed)
Diane, desk nurse spoke with pt today about other issues.  This nurse was informed by desk nurse that pt did well after first chemo yesterday.  Eating and drinking fine, slept well last night, no nausea/vomiting  Per Diane.

## 2014-08-04 ENCOUNTER — Encounter: Payer: Self-pay | Admitting: Internal Medicine

## 2014-08-04 ENCOUNTER — Other Ambulatory Visit (INDEPENDENT_AMBULATORY_CARE_PROVIDER_SITE_OTHER): Payer: BC Managed Care – PPO

## 2014-08-04 ENCOUNTER — Ambulatory Visit (INDEPENDENT_AMBULATORY_CARE_PROVIDER_SITE_OTHER): Payer: BC Managed Care – PPO | Admitting: Internal Medicine

## 2014-08-04 VITALS — BP 126/70 | HR 72 | Ht 71.0 in | Wt 147.5 lb

## 2014-08-04 DIAGNOSIS — R197 Diarrhea, unspecified: Secondary | ICD-10-CM

## 2014-08-04 DIAGNOSIS — R634 Abnormal weight loss: Secondary | ICD-10-CM

## 2014-08-04 LAB — AFB CULTURE WITH SMEAR (NOT AT ARMC): Acid Fast Smear: NONE SEEN

## 2014-08-04 LAB — SEDIMENTATION RATE: Sed Rate: 3 mm/hr (ref 0–22)

## 2014-08-04 LAB — IGA: IgA: 128 mg/dL (ref 68–378)

## 2014-08-04 MED ORDER — MEGESTROL ACETATE 40 MG/ML PO SUSP: 200.0000 mg | Freq: Every day | ORAL | Status: DC

## 2014-08-04 NOTE — Patient Instructions (Addendum)
Your physician has requested that you go to the basement for the following lab work before leaving today: Celiac 10, IgA, Sed Rate  We have sent the following medications to your pharmacy for you to pick up at your convenience: Megace  CC:Dr Alroy Dust, Dr Julien Nordmann

## 2014-08-04 NOTE — Progress Notes (Signed)
Henry [name] [birth year] [id number]  Note: This dictation was prepared with Dragon digital system. Any transcriptional errors that result from this procedure are unintentional.   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 He underwent EUS one year ago and again last week by Dr. Ardis Hughs which showed  chronic pancreatitis with some hyperechoic stranding within the parenchyma. There was no discrete pancreatic mass. A 1.4 cm. pancreatic lymph node was biopsied and was benign. He has a normal main pancreatic duct and normal common bile duct. His CA 19-9 tumor marker is elevated at 570. He continues to drink alcohol. His liver function tests were normal. He has had a massive weight loss from 196 pounds 1 year ago when I saw him to a current 147 pounds. He claims that he eats less than 2000 calories/day  because of decreased appetite and poor taste in his mouth. He also claims to have celiac disease. He has been on a self-imposed a gluten-free diet for the past 4 months.He also says he is allergic to pork and cheese. He used to be on angiotensin converting enzymes and found out on the Internet that it could be associated with celiac disease. He has been off Benicar for the past 6 years. 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 We did a colonoscopy in February 2014 which showed 4 polyps, 2 of them tubular adenomas. A prior colonoscopy in 1997, 2002 and 2009 also showed polyps. He tried pancreatic enzymes, but not on a regular basis because he  claims there may contain  pork substrate.. He has a history of hemachromatosis but has not had any phlebotomies.    Past Medical History  Diagnosis Date  . Hypertension   .  GERD (gastroesophageal reflux disease)   . Hemochromatosis   . Hx of adenomatous colonic polyps   . Umbilical hernia     none since weight loss  . Diverticulosis   . Lumbar spondylosis     ankle  . Pleural effusion on right 12/16/13    per chest xray  . Shortness of breath     with  exertion  . Chronic pancreatitis   . IBS (irritable bowel syndrome)   . Lung cancer 06/2014  . 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 Lumbar laminectomy  1983  . Knee arthrsoscopy  2003    left  . Tonsillectomy  1962  . Eus N/A 04/07/2013    Procedure: UPPER ENDOSCOPIC ULTRASOUND (EUS) LINEAR;  Surgeon: [name], MD;  Location: [facility];  Service: Endoscopy;  Laterality: N/A;  . Video assisted thoracoscopy (vats)/decortication Right 06/22/2014    Procedure: VIDEO ASSISTED THORACOSCOPY (VATS)/DECORTICATION;  Surgeon: [name], MD;  Location: [facility];  Service: Thoracic;  Laterality: Right;  . Pleural effusion drainage Right 06/22/2014    Procedure: DRAINAGE OF PLEURAL EFFUSION;  Surgeon: [name], MD;  Location: [facility];  Service: Thoracic;  Laterality: Right;  . Eus N/A 07/20/2014    Procedure: UPPER ENDOSCOPIC ULTRASOUND (EUS) LINEAR;  Surgeon: [name], MD;  Location: [facility];  Service: Endoscopy;  Laterality: N/A;    No Known Allergies  Family history and social history  have been reviewed.  Review of Systems: Denies dysphagia heartburn abdominal pain   The remainder of the 10 point ROS is negative except as outlined in the H&P  Physical Exam: General Appearance Thin, in no distress Eyes  Non icteric  HEENT  Non traumatic, normocephalic  Mouth No lesion, tongue papillated, no cheilosis Neck Supple without adenopathy, thyroid not enlarged, no carotid bruits, no JVD Lungs Clear to auscultation bilaterally COR Normal S1, normal S2, regular rhythm, no murmur, quiet precordium Abdomen: hepatomegaly. Liver  edge at least 10 cm below right costal margin. No ascites. No tenderness. Post thoracotomy scar right rib cage. No palpable mass  Rectal not done Extremities  trace pedal edema Skin No lesions, Dupuytren contractures Neurological Alert and oriented x 3, no asterixis. 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 A recent EUS showed chronic pancreatitis but no mass although his CA 19-9 tumor marker is elevated at 570 . He has had massive weight loss. We need to rule out pancreatic insufficiency and celiac disease. We will continue pancreatic enzymes replacement. We will check a sprue profile today as well as IgA and sedimentation rate. We will start Megace 5 cc daily to improve appetite. I asked him to limit his alcohol intake which has been excessive over the years. He does not seem to be following instructions from his physicians.    Delfin Edis 08/04/2014

## 2014-08-07 LAB — CELIAC PANEL 10
ENDOMYSIAL SCREEN: NEGATIVE
GLIADIN IGA: 6.6 U/mL (ref <20)
Gliadin IgG: 6.7 U/mL (ref <20)
IgA: 131 mg/dL (ref 68–379)
TISSUE TRANSGLUTAMINASE AB, IGA: 4.2 U/mL (ref <20)
Tissue Transglut Ab: 7 U/mL (ref <20)

## 2014-08-08 ENCOUNTER — Telehealth: Payer: Self-pay | Admitting: *Deleted

## 2014-08-08 NOTE — Telephone Encounter (Signed)
Pt called wanting to know if he should have thyroid surgery now or wait.  Per Dr Vista Mink, pt needs to wait until after his chemo is completed.  He can have a referral now if he would like.  Pt is going to wait on the referral.  Pt also states he needs to have dental work done.  Pt is coming in for f/u tomorrow, advised he discuss further tomorrow at visit.

## 2014-08-09 ENCOUNTER — Other Ambulatory Visit (HOSPITAL_BASED_OUTPATIENT_CLINIC_OR_DEPARTMENT_OTHER): Payer: BC Managed Care – PPO

## 2014-08-09 ENCOUNTER — Telehealth: Payer: Self-pay | Admitting: Internal Medicine

## 2014-08-09 ENCOUNTER — Ambulatory Visit (HOSPITAL_BASED_OUTPATIENT_CLINIC_OR_DEPARTMENT_OTHER): Payer: BC Managed Care – PPO | Admitting: Nurse Practitioner

## 2014-08-09 ENCOUNTER — Encounter: Payer: Self-pay | Admitting: Nurse Practitioner

## 2014-08-09 ENCOUNTER — Telehealth: Payer: Self-pay | Admitting: Nurse Practitioner

## 2014-08-09 VITALS — BP 112/66 | HR 57 | Temp 98.4°F | Resp 20 | Ht 71.0 in | Wt 139.8 lb

## 2014-08-09 DIAGNOSIS — C801 Malignant (primary) neoplasm, unspecified: Secondary | ICD-10-CM

## 2014-08-09 DIAGNOSIS — C9110 Chronic lymphocytic leukemia of B-cell type not having achieved remission: Secondary | ICD-10-CM

## 2014-08-09 DIAGNOSIS — C3490 Malignant neoplasm of unspecified part of unspecified bronchus or lung: Secondary | ICD-10-CM

## 2014-08-09 DIAGNOSIS — C3431 Malignant neoplasm of lower lobe, right bronchus or lung: Secondary | ICD-10-CM

## 2014-08-09 DIAGNOSIS — E041 Nontoxic single thyroid nodule: Secondary | ICD-10-CM

## 2014-08-09 DIAGNOSIS — R634 Abnormal weight loss: Secondary | ICD-10-CM

## 2014-08-09 LAB — COMPREHENSIVE METABOLIC PANEL (CC13)
ALT: 16 U/L (ref 0–55)
AST: 13 U/L (ref 5–34)
Albumin: 3.5 g/dL (ref 3.5–5.0)
Alkaline Phosphatase: 65 U/L (ref 40–150)
Anion Gap: 7 mEq/L (ref 3–11)
BUN: 13.8 mg/dL (ref 7.0–26.0)
CO2: 32 meq/L — AB (ref 22–29)
Calcium: 9.6 mg/dL (ref 8.4–10.4)
Chloride: 100 mEq/L (ref 98–109)
Creatinine: 0.7 mg/dL (ref 0.7–1.3)
Glucose: 105 mg/dl (ref 70–140)
Potassium: 3.6 mEq/L (ref 3.5–5.1)
Sodium: 138 mEq/L (ref 136–145)
Total Bilirubin: 1.39 mg/dL — ABNORMAL HIGH (ref 0.20–1.20)
Total Protein: 6.6 g/dL (ref 6.4–8.3)

## 2014-08-09 LAB — CBC WITH DIFFERENTIAL/PLATELET
BASO%: 0.8 % (ref 0.0–2.0)
BASOS ABS: 0 10*3/uL (ref 0.0–0.1)
EOS ABS: 0.4 10*3/uL (ref 0.0–0.5)
EOS%: 8.7 % — ABNORMAL HIGH (ref 0.0–7.0)
HEMATOCRIT: 45.2 % (ref 38.4–49.9)
HEMOGLOBIN: 15.6 g/dL (ref 13.0–17.1)
LYMPH#: 1.2 10*3/uL (ref 0.9–3.3)
LYMPH%: 24 % (ref 14.0–49.0)
MCH: 33 pg (ref 27.2–33.4)
MCHC: 34.5 g/dL (ref 32.0–36.0)
MCV: 95.7 fL (ref 79.3–98.0)
MONO#: 0.3 10*3/uL (ref 0.1–0.9)
MONO%: 5.4 % (ref 0.0–14.0)
NEUT%: 61.1 % (ref 39.0–75.0)
NEUTROS ABS: 3.1 10*3/uL (ref 1.5–6.5)
Platelets: 193 10*3/uL (ref 140–400)
RBC: 4.72 10*6/uL (ref 4.20–5.82)
RDW: 14.5 % (ref 11.0–14.6)
WBC: 5.1 10*3/uL (ref 4.0–10.3)

## 2014-08-09 NOTE — Telephone Encounter (Signed)
[facility] dn printed appt sched and avs for pt for Sept and OCT.....lvm for ccs to sched appt with Dr. Harlow Asa

## 2014-08-09 NOTE — Assessment & Plan Note (Signed)
Long discussion with the patient today regarding his dietary restrictions.  Patient states that gluten and dairy causes diarrhea; so he has drastically limited all of this in his diet.  He continues to push protein.  He continues to lose weight; noting that he has lost another 6 pounds since his last weight check.  The patient did meet with the dietitian; but he states that it was of little assistance to him.  He states that he will continue to drink nutritional supplements.  Patient was also prescribed Megace to increase his appetite.

## 2014-08-09 NOTE — Telephone Encounter (Signed)
FAXED PT [facility]

## 2014-08-09 NOTE — Assessment & Plan Note (Signed)
Patient received cycle 1, day 1 of his carboplatin/Alimta chemotherapy regimen on 08/02/2014.  He appears to be tolerating his new chemotherapy regimen fairly well.  Labs were all within normal limits today.  Patient will continue with weekly lab draws; and return for his next cycle of chemotherapy on 08/23/2014.

## 2014-08-09 NOTE — Progress Notes (Signed)
[place]   Chief Complaint  Patient presents with  . Follow-up    HPI: JAAMAL [name] 63 y.o. male diagnosed with lung cancer.  Currently undergoing carboplatin/Alimta chemotherapy regimen.  Patient received cycle one of his carboplatin/Alimta chemotherapy regimen on 08/02/2014.  He is here today for weekly labs and follow up.  He states he's been doing very well with his first cycle of chemotherapy.  He denies any fatigue.  He also denies any nausea or vomiting.  He denies any recent fevers or chills.  Patient reports that he has a healing further limited his diet-stating that he has discovered that both gluten and dairy causes GI upset and diarrhea.  He states they're only minimal foods that he can now with no diarrhea upset.  He has been evaluated by gastroenterologist; and had celiac disease lab test drawn-which were negative.  He states his appetite remains strong; he is concerned that he continues to lose weight.  Patient's lost another 6 pounds since his last weight check.  Also, patient has recently had a left thyroid nodule biopsied.  He is awaiting a surgical referral for further evaluation of this nodule.  Patient also reports that he has put the cavity to be filled; and some crown dental work to be done as well.  He would like to arrange for this dental appointment in between his chemotherapy treatments at all possible.   Patient was reports that he has been able to decrease his smoking down to only one pack per day.  He states that he plans to completely stop smoking altogether very soon.  HPI  CURRENT THERAPY: Upcoming Treatment Dates - LUNG Pemetrexed (Alimta) / Carboplatin q21d Days with orders from any treatment category:  08/23/2014      SCHEDULING COMMUNICATION      ondansetron (ZOFRAN) IVPB 16 mg      Dexamethasone Sodium Phosphate (DECADRON) injection 20 mg      PEMEtrexed (ALIMTA) 925 mg in sodium chloride 0.9 % 100 mL chemo infusion  CARBOplatin (PARAPLATIN) in sodium chloride 0.9 % 100 mL chemo infusion      sodium chloride 0.9 % injection 10 mL      heparin lock flush 100 unit/mL      heparin lock flush 100 unit/mL      alteplase (CATHFLO ACTIVASE) injection 2 mg      sodium chloride 0.9 % injection 3 mL      0.9 %  sodium chloride infusion      TREATMENT CONDITIONS 09/13/2014      [facility] SCHEDULING COMMUNICATION      ONDANSETRON IVPB ORDERABLE ([facility])      DEXAMETHASONE INJECTION ORDERABLE [facility]      PEMEtrexed (ALIMTA) 925 mg in sodium chloride 0.9 % 100 mL chemo infusion      CARBOplatin (PARAPLATIN) in sodium chloride 0.9 % 100 mL chemo infusion      SODIUM CHLORIDE 0.9 % IJ SOLN      HEPARIN SODIUM LOCK FLUSH 100 UNIT/ML IV SOLN      HEPARIN SODIUM LOCK FLUSH 100 UNIT/ML IV SOLN      ALTEPLASE 2 MG IJ SOLR      SODIUM CHLORIDE 0.9 % IJ SOLN      cyanocobalamin ((VITAMIN B-12)) injection 1,000 mcg      SODIUM CHLORIDE 0.9 % IV SOLN      TREATMENT CONDITIONS 10/04/2014      [facility] SCHEDULING COMMUNICATION      ONDANSETRON IVPB ORDERABLE (  [facility])      DEXAMETHASONE INJECTION ORDERABLE [facility]      PEMEtrexed (ALIMTA) 925 mg in sodium chloride 0.9 % 100 mL chemo infusion      CARBOplatin (PARAPLATIN) in sodium chloride 0.9 % 100 mL chemo infusion      SODIUM CHLORIDE 0.9 % IJ SOLN      HEPARIN SODIUM LOCK FLUSH 100 UNIT/ML IV SOLN      HEPARIN SODIUM LOCK FLUSH 100 UNIT/ML IV SOLN      ALTEPLASE 2 MG IJ SOLR      SODIUM CHLORIDE 0.9 % IJ SOLN      SODIUM CHLORIDE 0.9 % IV SOLN      TREATMENT CONDITIONS    ROS  Past Medical History  Diagnosis Date  . Hypertension   . GERD (gastroesophageal reflux disease)   . Hemochromatosis   . Hx of adenomatous colonic polyps   . Umbilical hernia     none since weight loss  . Diverticulosis   . Lumbar spondylosis     ankle  . Pleural effusion on right 12/16/13    per chest xray  . Shortness of breath     with  exertion  . Chronic pancreatitis   . IBS  (irritable bowel syndrome)   . Lung cancer 06/2014  . 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 Lumbar laminectomy  1983  . Knee arthrsoscopy  2003    left  . Tonsillectomy  1962  . Eus N/A 04/07/2013    Procedure: UPPER ENDOSCOPIC ULTRASOUND (EUS) LINEAR;  Surgeon: [name], MD;  Location: [facility];  Service: Endoscopy;  Laterality: N/A;  . Video assisted thoracoscopy (vats)/decortication Right 06/22/2014    Procedure: VIDEO ASSISTED THORACOSCOPY (VATS)/DECORTICATION;  Surgeon: [name], MD;  Location: [facility];  Service: Thoracic;  Laterality: Right;  . Pleural effusion drainage Right 06/22/2014    Procedure: DRAINAGE OF PLEURAL EFFUSION;  Surgeon: [name], MD;  Location: [place];  Service: Thoracic;  Laterality: Right;  . Eus N/A 07/20/2014    Procedure: UPPER ENDOSCOPIC ULTRASOUND (EUS) LINEAR;  Surgeon: [name], MD;  Location: [facility];  Service: Endoscopy;  Laterality: N/A;    has HTN (hypertension); Hx of adenomatous colonic polyps; Hemochromatosis; Nonspecific (abnormal) findings on radiological and other examination of gastrointestinal tract; Unspecified gastritis and gastroduodenitis without mention of hemorrhage; Pleural effusion on right; Lung cancer; Protein-calorie malnutrition, severe; Thyroid nodule; and Weight loss on his problem list.     has No Known Allergies.    Medication List       This list is accurate as of: 08/09/14  1:02 PM.  Always use your most recent med list.               albuterol 108 (90 BASE) MCG/ACT inhaler  Commonly known as:  PROVENTIL HFA;VENTOLIN HFA  Inhale 1-2 puffs into the lungs every 4 (four) hours as needed for wheezing or shortness of breath.     ALIGN 4 MG Caps  Take 4 mg by mouth daily.     dexamethasone 4 MG tablet  Commonly known as:  DECADRON  4 mg by mouth twice a day the day before, day of and day after the chemotherapy every 3 weeks.      diltiazem 360 MG 24 hr capsule  Commonly known as:  CARDIZEM CD  Take 360 mg by mouth every morning.     fluconazole 100 MG tablet  Commonly known as:  DIFLUCAN  Take 100 mg by mouth daily as needed.     folic acid 1 MG tablet  Commonly known as:  FOLVITE  Take 1 tablet (1 mg total) by mouth daily.     hydrochlorothiazide 25 MG tablet  Commonly known as:  HYDRODIURIL  Take 25 mg by mouth every evening.     ibuprofen 200 MG tablet  Commonly known as:  ADVIL,MOTRIN  Take 400 mg by mouth every 6 (six) hours as needed (Pain).     megestrol 40 MG/ML suspension  Commonly known as:  MEGACE ORAL  Take 5 mLs (200 mg total) by mouth daily.     nebivolol 5 MG tablet  Commonly known as:  BYSTOLIC  Take 5 mg by mouth every evening.     oxycodone 5 MG capsule  Commonly known as:  OXY-IR  Take 1 capsule (5 mg total) by mouth every 4 (four) hours as needed.     Pancrelipase (Lip-Prot-Amyl) 20880 UNITS Tabs  Take 2 tablets by mouth 3 (three) times daily with meals.     potassium chloride SA 20 MEQ tablet  Commonly known as:  K-DUR,KLOR-CON  Take 40 mEq by mouth daily.     prochlorperazine 10 MG tablet  Commonly known as:  COMPAZINE  Take 1 tablet (10 mg total) by mouth every 6 (six) hours as needed for nausea or vomiting.     tamsulosin 0.4 MG Caps capsule  Commonly known as:  FLOMAX     VOLTAREN 1 % Gel  Generic drug:  diclofenac sodium  Apply 2 g topically 3 (three) times a week. Used on ankle         PHYSICAL EXAMINATION  Blood pressure 112/66, pulse 57, temperature 98.4 F (36.9 C), temperature source Oral, resp. rate 20, height 5\' 11"  (1.803 m), weight 139 lb 12.8 oz (63.413 kg), SpO2 98.00%.  Physical Exam  Nursing note and vitals reviewed. Constitutional: He is oriented to person, place, and time. Vital signs are normal. He appears malnourished. He appears unhealthy. He appears cachectic.  HENT:  Head: Atraumatic.  Mouth/Throat: Oropharynx is clear and moist.  No oropharyngeal exudate.  Eyes: Conjunctivae are normal. Pupils are equal, round, and reactive to light. No scleral icterus.  Neck: Normal range of motion. Neck supple. No thyromegaly present.  Cardiovascular: Normal rate, regular rhythm, normal heart sounds and intact distal pulses.  Exam reveals no friction rub.   No murmur heard. Pulmonary/Chest: Breath sounds normal. No respiratory distress.  Abdominal: Soft. Bowel sounds are normal. He exhibits no distension. There is no tenderness. There is no rebound.  Musculoskeletal: Normal range of motion. He exhibits no edema and no tenderness.  Lymphadenopathy:    He has no cervical adenopathy.  Neurological: He is alert and oriented to person, place, and time. Gait normal.  Skin: Skin is warm and dry. No rash noted. No erythema.  Psychiatric: Affect normal.    LABORATORY DATA:.  RADIOGRAPHIC STUDIES:  ASSESSMENT/PLAN:    Lung cancer  Assessment & Plan Patient received cycle 1, day 1 of his carboplatin/Alimta chemotherapy regimen on 08/02/2014.  He appears to be tolerating his new chemotherapy regimen fairly well.  Labs were all within normal limits today.  Patient will continue with weekly lab draws; and return for his next cycle of chemotherapy on 08/23/2014.   Thyroid nodule  Assessment & Plan Patient has recently obtained a left thyroid nodule biopsy; and will now be referred to Dr. Harlow Asa general surgeon for further  evaluation of this nodule.   Weight loss  Assessment & Plan Long discussion with the patient today regarding his dietary restrictions.  Patient states that gluten and dairy causes diarrhea; so he has drastically limited all of this in his diet.  He continues to push protein.  He continues to lose weight; noting that he has lost another 6 pounds since his last weight check.  The patient did meet with the dietitian; but he states that it was of little assistance to him.  He states that he will continue to drink  nutritional supplements.  Patient was also prescribed Megace to increase his appetite.   Patient stated understanding of all instructions; and was in agreement with this plan of care. The patient knows to call the clinic with any problems, questions or concerns.   This was a shared visit with Dr. Julien Nordmann today.   Total time spent with patient was 25 minutes;  with greater than 75 percent of that time spent in face to face counseling regarding his symptoms,  and coordination of care and follow up.  Disclaimer: This note was dictated with voice recognition software. Similar sounding words can inadvertently be transcribed and may not be corrected upon review.   Drue Second, NP 08/09/2014   ADDENDUM: Hematology/Oncology Attending: I had a face to face encounter with the patient. I recommended his care plan. 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 He is currently undergoing systemic chemotherapy with carboplatin and Alimta status post 1 cycle. He tolerated the first cycle of his treatment fairly well with no significant adverse effects. He continues to have weight loss and he was evaluated by the dietitian at the [facility]. We encouraged the patient to increase his nutritional supplements. 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 I would consider referring the patient to Dr. Harlow Asa for evaluation and consideration of surgical resection after completion of his chemotherapy. I recommended for the patient to continue his current treatment was carboplatin and Alimta as scheduled. He would come back for followup visit in 2 weeks with the start of cycle #2. He was advised to call immediately if he has any concerning symptoms in the interval.  Disclaimer: This note was dictated with voice recognition software. Similar sounding words can inadvertently be transcribed and may be  missed upon review. Eilleen Kempf., MD 08/12/2014

## 2014-08-09 NOTE — Assessment & Plan Note (Signed)
Patient has recently obtained a left thyroid nodule biopsy; and will now be referred to Dr. Harlow Asa general surgeon for further evaluation of this nodule.

## 2014-08-15 ENCOUNTER — Telehealth: Payer: Self-pay | Admitting: Physician Assistant

## 2014-08-15 NOTE — Telephone Encounter (Signed)
Pt called to r/s labs for 09/30 lft msg, we have been missing each other's calls. Advised pt to call office to r/s

## 2014-08-16 ENCOUNTER — Other Ambulatory Visit (HOSPITAL_BASED_OUTPATIENT_CLINIC_OR_DEPARTMENT_OTHER): Payer: BC Managed Care – PPO

## 2014-08-16 ENCOUNTER — Telehealth: Payer: Self-pay | Admitting: *Deleted

## 2014-08-16 DIAGNOSIS — C3431 Malignant neoplasm of lower lobe, right bronchus or lung: Secondary | ICD-10-CM

## 2014-08-16 DIAGNOSIS — C801 Malignant (primary) neoplasm, unspecified: Secondary | ICD-10-CM

## 2014-08-16 LAB — CBC WITH DIFFERENTIAL/PLATELET
BASO%: 0.3 % (ref 0.0–2.0)
BASOS ABS: 0 10*3/uL (ref 0.0–0.1)
EOS%: 1.9 % (ref 0.0–7.0)
Eosinophils Absolute: 0.1 10*3/uL (ref 0.0–0.5)
HCT: 39.7 % (ref 38.4–49.9)
HEMOGLOBIN: 13.7 g/dL (ref 13.0–17.1)
LYMPH#: 1.6 10*3/uL (ref 0.9–3.3)
LYMPH%: 22.6 % (ref 14.0–49.0)
MCH: 32.6 pg (ref 27.2–33.4)
MCHC: 34.4 g/dL (ref 32.0–36.0)
MCV: 94.8 fL (ref 79.3–98.0)
MONO#: 1 10*3/uL — ABNORMAL HIGH (ref 0.1–0.9)
MONO%: 13.9 % (ref 0.0–14.0)
NEUT#: 4.4 10*3/uL (ref 1.5–6.5)
NEUT%: 61.3 % (ref 39.0–75.0)
Platelets: 145 10*3/uL (ref 140–400)
RBC: 4.19 10*6/uL — ABNORMAL LOW (ref 4.20–5.82)
RDW: 13.8 % (ref 11.0–14.6)
WBC: 7.2 10*3/uL (ref 4.0–10.3)

## 2014-08-16 LAB — COMPREHENSIVE METABOLIC PANEL (CC13)
ALBUMIN: 3.2 g/dL — AB (ref 3.5–5.0)
ALT: 9 U/L (ref 0–55)
AST: 12 U/L (ref 5–34)
Alkaline Phosphatase: 75 U/L (ref 40–150)
Anion Gap: 7 mEq/L (ref 3–11)
BUN: 9.4 mg/dL (ref 7.0–26.0)
CHLORIDE: 105 meq/L (ref 98–109)
CO2: 28 mEq/L (ref 22–29)
Calcium: 9.3 mg/dL (ref 8.4–10.4)
Creatinine: 0.7 mg/dL (ref 0.7–1.3)
Glucose: 126 mg/dl (ref 70–140)
POTASSIUM: 3.6 meq/L (ref 3.5–5.1)
SODIUM: 140 meq/L (ref 136–145)
TOTAL PROTEIN: 6.2 g/dL — AB (ref 6.4–8.3)
Total Bilirubin: 1.08 mg/dL (ref 0.20–1.20)

## 2014-08-16 NOTE — Telephone Encounter (Signed)
Walk in form received reads "Lab work, [employer]". To lobby at 1255 after reviewing today's lab results.  Patient has left the building.  Labs are stable.  Next visit scheduled for 08-22-2014 and 08-23-2014.  And per Best Practice Advisory ([facility] will be offered Flu vaccine with the appointment.

## 2014-08-22 ENCOUNTER — Other Ambulatory Visit (HOSPITAL_BASED_OUTPATIENT_CLINIC_OR_DEPARTMENT_OTHER): Payer: BC Managed Care – PPO

## 2014-08-22 ENCOUNTER — Encounter: Payer: Self-pay | Admitting: Physician Assistant

## 2014-08-22 ENCOUNTER — Ambulatory Visit (HOSPITAL_BASED_OUTPATIENT_CLINIC_OR_DEPARTMENT_OTHER): Payer: BC Managed Care – PPO | Admitting: Physician Assistant

## 2014-08-22 ENCOUNTER — Telehealth: Payer: Self-pay | Admitting: Internal Medicine

## 2014-08-22 VITALS — BP 129/79 | HR 66 | Temp 98.4°F | Resp 18 | Ht 71.0 in | Wt 142.3 lb

## 2014-08-22 DIAGNOSIS — R634 Abnormal weight loss: Secondary | ICD-10-CM

## 2014-08-22 DIAGNOSIS — C801 Malignant (primary) neoplasm, unspecified: Secondary | ICD-10-CM

## 2014-08-22 DIAGNOSIS — C73 Malignant neoplasm of thyroid gland: Secondary | ICD-10-CM

## 2014-08-22 DIAGNOSIS — C3431 Malignant neoplasm of lower lobe, right bronchus or lung: Secondary | ICD-10-CM

## 2014-08-22 DIAGNOSIS — Z23 Encounter for immunization: Secondary | ICD-10-CM

## 2014-08-22 DIAGNOSIS — E46 Unspecified protein-calorie malnutrition: Secondary | ICD-10-CM

## 2014-08-22 LAB — CBC WITH DIFFERENTIAL/PLATELET
BASO%: 0.2 % (ref 0.0–2.0)
BASOS ABS: 0 10*3/uL (ref 0.0–0.1)
EOS%: 4.1 % (ref 0.0–7.0)
Eosinophils Absolute: 0.2 10*3/uL (ref 0.0–0.5)
HCT: 37 % — ABNORMAL LOW (ref 38.4–49.9)
HGB: 13.3 g/dL (ref 13.0–17.1)
LYMPH%: 29.7 % (ref 14.0–49.0)
MCH: 32.8 pg (ref 27.2–33.4)
MCHC: 35.9 g/dL (ref 32.0–36.0)
MCV: 91.4 fL (ref 79.3–98.0)
MONO#: 0.7 10*3/uL (ref 0.1–0.9)
MONO%: 15.9 % — AB (ref 0.0–14.0)
NEUT#: 2.3 10*3/uL (ref 1.5–6.5)
NEUT%: 50.1 % (ref 39.0–75.0)
Platelets: 263 10*3/uL (ref 140–400)
RBC: 4.05 10*6/uL — AB (ref 4.20–5.82)
RDW: 14.3 % (ref 11.0–14.6)
WBC: 4.7 10*3/uL (ref 4.0–10.3)
lymph#: 1.4 10*3/uL (ref 0.9–3.3)

## 2014-08-22 LAB — COMPREHENSIVE METABOLIC PANEL (CC13)
ALT: 12 U/L (ref 0–55)
ANION GAP: 7 meq/L (ref 3–11)
AST: 14 U/L (ref 5–34)
Albumin: 3.2 g/dL — ABNORMAL LOW (ref 3.5–5.0)
Alkaline Phosphatase: 71 U/L (ref 40–150)
BUN: 11.4 mg/dL (ref 7.0–26.0)
CALCIUM: 9.3 mg/dL (ref 8.4–10.4)
CHLORIDE: 105 meq/L (ref 98–109)
CO2: 31 meq/L — AB (ref 22–29)
CREATININE: 0.8 mg/dL (ref 0.7–1.3)
Glucose: 77 mg/dl (ref 70–140)
Potassium: 3.1 mEq/L — ABNORMAL LOW (ref 3.5–5.1)
Sodium: 143 mEq/L (ref 136–145)
Total Bilirubin: 0.63 mg/dL (ref 0.20–1.20)
Total Protein: 6.2 g/dL — ABNORMAL LOW (ref 6.4–8.3)

## 2014-08-22 MED ORDER — INFLUENZA VAC SPLIT QUAD 0.5 ML IM SUSY
0.5000 mL | PREFILLED_SYRINGE | Freq: Once | INTRAMUSCULAR | Status: AC
  Administered 2014-08-22: 0.5 mL via INTRAMUSCULAR
  Filled 2014-08-22: qty 0.5

## 2014-08-22 NOTE — Telephone Encounter (Signed)
, °

## 2014-08-22 NOTE — Progress Notes (Addendum)
[place] [phone]   Fax:(336) [place], [place] 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  Genomic Alterations Identified? FGFR2 A97T KRAS Q61H - subclonal? ARID1A [id number]* - subclonal?  PRIOR THERAPY: Status post right video-assisted thoracoscopy, drainage of pleural effusion, pleural biopsy, partial decortication, talc pleurodesis.under the care of Dr. Roxan Hockey on 06/23/2014.  CURRENT THERAPY: Systemic chemotherapy with carboplatin for an AUC of 5 and Alimta 500 mg per meter squared given every 3 weeks. Status post 1 cycle  INTERVAL HISTORY: Henry [name] 63 y.o. male returns to the clinic today for symptom management visit after completing his first cycle of systemic chemotherapy with carboplatin and Alimta.. The patient is feeling fine today. He reports that he did have some diarrhea however he has had diarrhea for approximately one year. He is been evaluated by Dr. Olevia Perches but is interested in a second opinion.The patient denied having any other significant complaints today.  which is currently stable. He has no nausea or vomiting, no fever or chills. He gained a few pounds since his last visit. He requests a flu vaccine today.  MEDICAL HISTORY: Past Medical History  Diagnosis Date  . Hypertension   . GERD (gastroesophageal reflux disease)   . Hemochromatosis   . Hx of adenomatous colonic polyps   . Umbilical hernia     none since weight loss  . Diverticulosis   . Lumbar spondylosis     ankle  . Pleural effusion on right 12/16/13    per chest xray  . Shortness of breath     with  exertion  . Chronic pancreatitis   . IBS (irritable bowel syndrome)   . Lung cancer 06/2014  . 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  MEDICATIONS:  Current Outpatient Prescriptions  Medication Sig Dispense Refill  . dexamethasone (DECADRON) 4 MG tablet 4 mg by mouth twice a day the day before, day of and day after the chemotherapy every 3 weeks.  40 tablet  1  . diltiazem (CARDIZEM CD) 360 MG 24 hr capsule Take 360 mg by mouth every morning.       . folic acid (FOLVITE) 1 MG tablet Take 1 tablet (1 mg total) by mouth daily.  30 tablet  3  . hydrochlorothiazide (HYDRODIURIL) 25 MG tablet Take 25 mg by mouth every evening.       . nebivolol (BYSTOLIC) 5 MG tablet Take 5 mg by mouth every evening.       Marland Kitchen oxycodone (OXY-IR) 5 MG capsule Take 1 capsule (5 mg total) by mouth every 4 (four) hours as needed.  30 capsule  0  . Pancrelipase, Lip-Prot-Amyl, 20880 UNITS TABS Take 2 tablets by mouth 3 (three) times daily with meals.  540 tablet  2  . potassium chloride SA (K-DUR,KLOR-CON) 20 MEQ tablet Take 40 mEq by mouth daily.       Marland Kitchen albuterol (PROVENTIL HFA;VENTOLIN HFA) 108 (90 BASE) MCG/ACT inhaler Inhale 1-2 puffs into the lungs every 4 (four) hours as needed for wheezing or shortness of breath.       . fluconazole (DIFLUCAN) 100 MG tablet Take 100 mg by mouth daily as needed.      Marland Kitchen ibuprofen (ADVIL,MOTRIN) 200 MG tablet Take 400 mg by mouth every 6 (six)  hours as needed (Pain).      . megestrol (MEGACE ORAL) 40 MG/ML suspension Take 5 mLs (200 mg total) by mouth daily.  240 mL  1  . Probiotic Product (ALIGN) 4 MG CAPS Take 4 mg by mouth daily.       . prochlorperazine (COMPAZINE) 10 MG tablet Take 1 tablet (10 mg total) by mouth every 6 (six) hours as needed for nausea or vomiting.  30 tablet  0  . tamsulosin (FLOMAX) 0.4 MG CAPS capsule       . VOLTAREN 1 % GEL Apply 2 g topically 3 (three) times a week. Used on ankle       No current facility-administered medications for this visit.    SURGICAL HISTORY:  Past Surgical History  Procedure Laterality Date  . Lumbar laminectomy   1983  . Knee arthrsoscopy  2003    left  . Tonsillectomy  1962  . Eus N/A 04/07/2013    Procedure: UPPER ENDOSCOPIC ULTRASOUND (EUS) LINEAR;  Surgeon: [name], MD;  Location: [facility];  Service: Endoscopy;  Laterality: N/A;  . Video assisted thoracoscopy (vats)/decortication Right 06/22/2014    Procedure: VIDEO ASSISTED THORACOSCOPY (VATS)/DECORTICATION;  Surgeon: [name], MD;  Location: [facility];  Service: Thoracic;  Laterality: Right;  . Pleural effusion drainage Right 06/22/2014    Procedure: DRAINAGE OF PLEURAL EFFUSION;  Surgeon: [name], MD;  Location: [facility];  Service: Thoracic;  Laterality: Right;  . Eus N/A 07/20/2014    Procedure: UPPER ENDOSCOPIC ULTRASOUND (EUS) LINEAR;  Surgeon: [name], MD;  Location: [facility];  Service: Endoscopy;  Laterality: N/A;    REVIEW OF SYSTEMS:  Constitutional: positive for fatigue Eyes: negative Ears, nose, mouth, throat, and face: negative Respiratory: positive for dyspnea on exertion Cardiovascular: negative Gastrointestinal: positive for diarrhea Genitourinary:negative Integument/breast: negative Hematologic/lymphatic: negative Musculoskeletal:negative Neurological: negative Behavioral/Psych: negative Endocrine: negative Allergic/Immunologic: negative   PHYSICAL EXAMINATION: General appearance: alert, cooperative, fatigued and no distress Head: Normocephalic, without obvious abnormality, atraumatic Neck: no adenopathy, no JVD, supple, symmetrical, trachea midline and thyroid not enlarged, symmetric, no tenderness/mass/nodules Lymph nodes: Cervical, supraclavicular, and axillary nodes normal. Resp: clear to auscultation bilaterally Back: symmetric, no curvature. ROM normal. No CVA tenderness. Cardio: regular rate and rhythm, S1, S2 normal, no murmur, click, rub or gallop GI: soft, non-tender; bowel sounds normal; no masses,  no organomegaly Extremities: extremities normal, atraumatic, no cyanosis  or edema Neurologic: Alert and oriented X 3, normal strength and tone. Normal symmetric reflexes. Normal coordination and gait  ECOG PERFORMANCE STATUS: 1 - Symptomatic but completely ambulatory  Blood pressure 129/79, pulse 66, temperature 98.4 F (36.9 C), temperature source Oral, resp. rate 18, height 5' 11"  (1.803 m), weight 142 lb 4.8 oz (64.547 kg), SpO2 100.00%.  LABORATORY DATA: Lab Results  Component Value Date   WBC 4.7 08/22/2014   HGB 13.3 08/22/2014   HCT 37.0* 08/22/2014   MCV 91.4 08/22/2014   PLT 263 08/22/2014      Chemistry      Component Value Date/Time   NA 143 08/22/2014 0856   NA 140 06/24/2014 0346   K 3.1* 08/22/2014 0856   K 3.3* 06/24/2014 0346   CL 102 06/24/2014 0346   CO2 31* 08/22/2014 0856   CO2 28 06/24/2014 0346   BUN 11.4 08/22/2014 0856   BUN 11 06/24/2014 0346   CREATININE 0.8 08/22/2014 0856   CREATININE 0.75 06/24/2014 0346      Component Value Date/Time   CALCIUM  9.3 08/22/2014 0856   CALCIUM 8.6 06/24/2014 0346   ALKPHOS 71 08/22/2014 0856   ALKPHOS 54 06/24/2014 0346   AST 14 08/22/2014 0856   AST 13 06/24/2014 0346   ALT 12 08/22/2014 0856   ALT 11 06/24/2014 0346   BILITOT 0.63 08/22/2014 0856   BILITOT 0.6 06/24/2014 0346       RADIOGRAPHIC STUDIES: Dg Chest 2 View  07/18/2014   CLINICAL DATA:  Two weeks status post thoracentesis on the right  EXAM: CHEST  2 VIEW  COMPARISON:  PA and lateral chest x-ray of June 28, 2014  FINDINGS: There is a tiny stable right apical pneumothorax. A pleural line is visible between the third and fourth posterior ribs. There is a small amount of pleural fluid remaining. The subcutaneous emphysema has cleared. Parenchymal densities are present peripherally in the right lung especially at the lung base. The left lung is well-expanded and clear. The mediastinum is not shifted. The cardiac silhouette is normal in size. The pulmonary vascularity is normal. The right internal jugular venous catheter is been removed.  IMPRESSION: There  stable appearance of the small hydro pneumothorax on the right. Persistent patchy parenchymal density peripherally in the right upper and mid and lower lung is demonstrated, more conspicuous at the lung base today.   Electronically Signed   By: David  Martinique   On: 07/18/2014 14:50   Mr Jeri Cos Wo Contrast  07/04/2014   CLINICAL DATA:  63 year old male with metastatic adenocarcinoma status post partial decortication and pleurodesis. Staging. Subsequent encounter.  EXAM: MRI HEAD WITHOUT AND WITH CONTRAST  TECHNIQUE: Multiplanar, multiecho pulse sequences of the brain and surrounding structures were obtained without and with intravenous contrast.  CONTRAST:  49m MULTIHANCE GADOBENATE DIMEGLUMINE 529 MG/ML IV SOLN  COMPARISON:  PET-CT 06/30/2014.  FINDINGS: Some post-contrast images suggest subtle smooth pachymeningeal or increased leptomeningeal enhancement (e.g. Series 11, image 28 and series 13, image 10), but there is no definite abnormal meningeal thickening or enhancement. No enhancing brain lesion identified. No midline shift, mass effect, or evidence of intracranial mass lesion.  No restricted diffusion to suggest acute infarction. No ventriculomegaly, extra-axial collection or acute intracranial hemorrhage. Cervicomedullary junction and pituitary are within normal limits. Cerebral volume is within normal limits for age. Major intracranial vascular flow voids are preserved. GPearline Cablesand white matter signal is within normal limits for age throughout the brain.  Visible internal auditory structures appear normal. Mastoids are clear. Scattered paranasal sinus mucosal thickening, inspissated secretions in the left frontal sinus. Mild Visualized orbit soft tissues are within normal limits. Visualized scalp soft tissues are within normal limits. Visualized bone marrow signal is within normal limits.  IMPRESSION: No acute or metastatic intracranial abnormality.   Electronically Signed   By: LLars PinksM.D.   On:  07/04/2014 19:33   UKoreaSoft Tissue Head/neck  07/25/2014   CLINICAL DATA:  Thyroid nodule on PET scan  EXAM: THYROID ULTRASOUND  TECHNIQUE: Ultrasound examination of the thyroid gland and adjacent soft tissues was performed.  COMPARISON:  06/30/2014  FINDINGS: Right thyroid lobe  Measurements: 5.0 x 2.0 x 1.3 cm. Normal echogenicity. No focal abnormality. Incidental 2 mm hypoechoic foci in the mid lower pole regions. No significant nodule or mass.  Left thyroid lobe  Measurements: 4.1 x 2.0 x 1.5 cm. Dominant left upper pole hypoechoic solid mass with microcalcifications measures 1.7 x 1.3 x 1.3 cm. This correlates with the PET positive nodule and warrants biopsy. Incidental well-circumscribed echogenic 9 mm nodule  noted in the lower pole as well.  Isthmus  Thickness: 3 mm.  No nodules visualized.  Lymphadenopathy  None visualized.  IMPRESSION: Dominant 1.7 cm left upper pole thyroid nodule with microcalcifications correlates with the PET positive nodule. Recommend FNA biopsy.  Additional incidental 9 mm left lower pole nodule. This warrants followup in 1 year.   Electronically Signed   By: Daryll Brod M.D.   On: 07/25/2014 17:39   Nm Pet Image Initial (pi) Skull Base To Thigh  06/30/2014   CLINICAL DATA:  Initial treatment strategy for metastatic lung cancer (adenocarcinoma). Patient underwent VATS with partial decortication and talc pleurodesis 06/22/2014.  EXAM: NUCLEAR MEDICINE PET SKULL BASE TO THIGH  TECHNIQUE: 7.44 mCi F-18 FDG was injected intravenously. Full-ring PET imaging was performed from the skull base to thigh after the radiotracer. CT data was obtained and used for attenuation correction and anatomic localization.  FASTING BLOOD GLUCOSE:  Value: 95 mg/dl  COMPARISON:  Chest CT 06/21/2014  FINDINGS: NECK  No hypermetabolic cervical lymph nodes are identified.There are no lesions of the pharyngeal mucosal space. There is a focal hypermetabolic nodule involving the inferior left thyroid lobe.  This measures approximately 1.5 cm and has an SUV max of 9.6.  CHEST  There are no hypermetabolic mediastinal, hilar or axillary lymph nodes. There is a persistent moderate size right hydro pneumothorax with partial lung collapse. Multiple air bubbles have developed within the posterior inferior pleural space. There is high density within the pleural space consistent with interval pleurodesis. There is hypermetabolic activity throughout the right pleural space corresponding with known metastatic disease. This has an SUV max of 4.3. The ill-defined subpleural nodularity in both lungs is also mildly hypermetabolic. No dominant lung mass or endobronchial lesion identified. There is soft tissue emphysema within the right chest wall laterally.  ABDOMEN/PELVIS  There is no hypermetabolic activity within the liver, adrenal glands, spleen or pancreas. There is no hypermetabolic nodal activity. Hepatic cysts and mild atherosclerosis noted. The prostate gland is moderately enlarged.  SKELETON  There is no hypermetabolic activity to suggest osseous metastatic disease.  IMPRESSION: 1. There is hypermetabolic activity throughout the right pleural space status post recent pleural decortication and talc pleurodesis for a malignant pleural effusion. 2. No dominant hypermetabolic lung mass or mediastinal nodal disease demonstrated. The patchy subpleural nodular densities in both lungs demonstrate nonspecific low level hypermetabolic activity. This could be inflammatory or neoplastic. 3. No distant metastases identified. 4. Hypermetabolic left thyroid nodule. Hypermetabolic thyroid nodules on PET have up to 40-50% incidence of malignancy; recommend further evaluation with thyroid ultrasound and possible US-guided fine needle aspiration.   Electronically Signed   By: Camie Patience M.D.   On: 06/30/2014 14:56   US Thyroid Biopsy  07/25/2014   CLINICAL DATA:  Left thyroid nodule by PET scan  EXAM: ULTRASOUND GUIDED NEEDLE ASPIRATE  BIOPSY OF THE THYROID GLAND  COMPARISON:  06/30/2014  PROCEDURE: Thyroid biopsy was thoroughly discussed with the patient and questions were answered. The benefits, risks, alternatives, and complications were also discussed. The patient understands and wishes to proceed with the procedure. Written consent was obtained.  Ultrasound was performed to localize and mark an adequate site for the biopsy. The patient was then prepped and draped in a normal sterile fashion. Local anesthesia was provided with 1% lidocaine. Using direct ultrasound guidance, 4 passes were made using needles into the nodule within the left lobe of the thyroid. Ultrasound was used to confirm needle placements on all occasions. Specimens  were sent to Pathology for analysis.  Complications:  No immediate  FINDINGS: Imaging confirms needle placement in the dominant left thyroid nodule containing microcalcifications  IMPRESSION: Ultrasound guided needle aspirate biopsy performed of the dominant left thyroid nodule.   Electronically Signed   By: Daryll Brod M.D.   On: 07/25/2014 16:53   ASSESSMENT AND PLAN: This is a very pleasant 63 years old white male with   1) metastatic adenocarcinoma of unknown primary questionable to be lung cancer versus hepatobiliary. The molecular studies showed no EGFR mutation or ALK gene translocation. The gastrointestinal workup performed by Dr. Yates Decamp was unremarkable for any malignancy and the esophagus, stomach or pancreas. He is currently being treated with systemic chemotherapy with carboplatin for AUC of 5 and Alimta 500 mg/M2 every 3 weeks. Status post 1 cycle. Overall he tolerated the first cycle of chemotherapy without difficulty. He'll continue with weekly labs as scheduled. He'll followup in 2 weeks prior to the start of cycle #2. Regarding his second opinion for diarrhea, we have given him several names of gastroenterologist in the [facility]. He'll make the appointment himself.  2) left thyroid  nodule papillary thyroid carcinoma: Patient referred to Gen. surgery for evaluation and consideration of resection of this lesion, which can be performed after completion of cycles of his chemotherapy for the metastatic adenocarcinoma.  3) weight loss and malnutrition: He was referred to the dietitian at the [facility].   He was advised to call immediately if he has any concerning symptoms in the interval. The patient voices understanding of current disease status and treatment options and is in agreement with the current care plan.  All questions were answered. The patient knows to call the clinic with any problems, questions or concerns. We can certainly see the patient much sooner if necessary.  Carlton Adam PA-C  ADDENDUM: Hematology/Oncology Attending:  I had a face to face encounter with the patient. I recommended his care plan. This is a very pleasant 63 years old white male recently diagnosed with metastatic adenocarcinoma of unknown primary questionable for upper gastrointestinal/pancreaticobiliary or primary lung cancer. He is currently undergoing systemic chemotherapy with carboplatin and Alimta status post 1 cycle. He tolerated the first cycle of his chemotherapy fairly well with no significant adverse effects. I recommended for him to proceed with cycle #2 today as scheduled. The patient would come back for followup visit in 3 weeks with the next cycle of his treatment. He was referred to Gen. Surgery for evaluation of the thyroid papillary carcinoma. He is also followed by the dietitian at the [facility] for the weight loss. He was advised to call immediately if he has any concerning symptoms in the interval.  Disclaimer: This note was dictated with voice recognition software. Similar sounding words can inadvertently be transcribed and may not be corrected upon review. Eilleen Kempf., MD 08/25/2014

## 2014-08-22 NOTE — Progress Notes (Signed)
Quick Note:  Call patient with the result and order K dur 20 meq po qd X 7 ______

## 2014-08-23 ENCOUNTER — Other Ambulatory Visit: Payer: BC Managed Care – PPO

## 2014-08-23 ENCOUNTER — Other Ambulatory Visit: Payer: Self-pay | Admitting: Internal Medicine

## 2014-08-23 ENCOUNTER — Ambulatory Visit: Payer: BC Managed Care – PPO | Admitting: Nutrition

## 2014-08-23 ENCOUNTER — Ambulatory Visit (HOSPITAL_BASED_OUTPATIENT_CLINIC_OR_DEPARTMENT_OTHER): Payer: BC Managed Care – PPO

## 2014-08-23 ENCOUNTER — Telehealth: Payer: Self-pay | Admitting: *Deleted

## 2014-08-23 VITALS — BP 131/79 | HR 66 | Temp 98.7°F | Resp 18

## 2014-08-23 DIAGNOSIS — C3431 Malignant neoplasm of lower lobe, right bronchus or lung: Secondary | ICD-10-CM

## 2014-08-23 DIAGNOSIS — C801 Malignant (primary) neoplasm, unspecified: Secondary | ICD-10-CM

## 2014-08-23 DIAGNOSIS — Z5111 Encounter for antineoplastic chemotherapy: Secondary | ICD-10-CM

## 2014-08-23 MED ORDER — ONDANSETRON 16 MG/50ML IVPB (CHCC)
16.0000 mg | Freq: Once | INTRAVENOUS | Status: AC
  Administered 2014-08-23: 16 mg via INTRAVENOUS

## 2014-08-23 MED ORDER — DEXAMETHASONE SODIUM PHOSPHATE 20 MG/5ML IJ SOLN
INTRAMUSCULAR | Status: AC
  Filled 2014-08-23: qty 5

## 2014-08-23 MED ORDER — ONDANSETRON 16 MG/50ML IVPB (CHCC)
INTRAVENOUS | Status: AC
  Filled 2014-08-23: qty 16

## 2014-08-23 MED ORDER — CARBOPLATIN CHEMO INJECTION 600 MG/60ML
565.5000 mg | Freq: Once | INTRAVENOUS | Status: AC
  Administered 2014-08-23: 570 mg via INTRAVENOUS
  Filled 2014-08-23: qty 57

## 2014-08-23 MED ORDER — SODIUM CHLORIDE 0.9 % IV SOLN
500.0000 mg/m2 | Freq: Once | INTRAVENOUS | Status: AC
  Administered 2014-08-23: 925 mg via INTRAVENOUS
  Filled 2014-08-23: qty 37

## 2014-08-23 MED ORDER — SODIUM CHLORIDE 0.9 % IV SOLN: Freq: Once | INTRAVENOUS | Status: DC

## 2014-08-23 MED ORDER — DEXAMETHASONE SODIUM PHOSPHATE 20 MG/5ML IJ SOLN
20.0000 mg | Freq: Once | INTRAMUSCULAR | Status: AC
  Administered 2014-08-23: 20 mg via INTRAVENOUS

## 2014-08-23 NOTE — Telephone Encounter (Signed)
Pt already has rx to take kdur 62meq daily.  Pt stated he has not been taking his 60meq daily as prescribed.  Pt will start to take it daily.

## 2014-08-23 NOTE — Progress Notes (Signed)
Nutrition followup completed with patient.  Weight continues to decline and was documented as 142.3 pounds on October 6.  Patient was on telephone, during chemotherapy so nutrition followup was brief.  Nursing had requested patient to complete phone call several times.  Patient continues to follow gluten-free lactose-free diet.  Continues to report intolerance to many foods.  Verbalizes he knows what, and how to eat.  Nutrition diagnosis: inadequate oral intake continues.  Intervention: Encouraged patient to continue oral intake to minimize further weight loss.   Recommended patient contact me if he has questions or concerns. Patient is not open to nutrition recommendations and prefers to continue dietary strategies on his own.  Monitoring, evaluation, goals: Patient will increase oral intake to minimize further weight loss.    Next visit: Not scheduled. I encouraged patient to contact me if I can be of any assistance.  **Disclaimer: This note was dictated with voice recognition software. Similar sounding words can inadvertently be transcribed and this note may contain transcription errors which may not have been corrected upon publication of note.**

## 2014-08-23 NOTE — Telephone Encounter (Signed)
Message copied by Natoshia Souter, [place] on Wed Aug 23, 2014  2:46 PM ------      Message from: Curt Bears      Created: Tue Aug 22, 2014  2:40 PM       Call patient with the result and order K dur 20 meq po qd X 7 ------

## 2014-08-23 NOTE — Patient Instructions (Addendum)
[facility] Discharge Instructions for Patients Receiving Chemotherapy  Today you received the following chemotherapy agents Alimta and Carboplatin  To help prevent nausea and vomiting after your treatment, we encourage you to take your nausea medication as prescribed.   If you develop nausea and vomiting that is not controlled by your nausea medication, call the clinic.   BELOW ARE SYMPTOMS THAT SHOULD BE REPORTED IMMEDIATELY:  *FEVER GREATER THAN 100.5 F  *CHILLS WITH OR WITHOUT FEVER  NAUSEA AND VOMITING THAT IS NOT CONTROLLED WITH YOUR NAUSEA MEDICATION  *UNUSUAL SHORTNESS OF BREATH  *UNUSUAL BRUISING OR BLEEDING  TENDERNESS IN MOUTH AND THROAT WITH OR WITHOUT PRESENCE OF ULCERS  *URINARY PROBLEMS  *BOWEL PROBLEMS  UNUSUAL RASH Items with * indicate a potential emergency and should be followed up as soon as possible.  Feel free to call the clinic you have any questions or concerns. The clinic phone number is [phone].   +

## 2014-08-25 NOTE — Patient Instructions (Signed)
Continue labs and chemotherapy as scheduled Follow up in 2 weeks prior to the start of your next cycle of chemotherapy Call [facility] gastroenterology to make an appointment for a second opinion regarding your diarrhea

## 2014-08-29 ENCOUNTER — Other Ambulatory Visit: Payer: Self-pay | Admitting: Medical Oncology

## 2014-08-29 ENCOUNTER — Telehealth: Payer: Self-pay | Admitting: Medical Oncology

## 2014-08-29 DIAGNOSIS — C7A00 Malignant carcinoid tumor of unspecified site: Secondary | ICD-10-CM

## 2014-08-29 MED ORDER — OXYCODONE HCL 5 MG PO CAPS: 5.0000 mg | ORAL_CAPSULE | ORAL | Status: DC | PRN

## 2014-08-29 NOTE — Telephone Encounter (Signed)
Refill Rx locked in injection room for patient pick up 08/30/14. Patient aware.

## 2014-08-29 NOTE — Telephone Encounter (Signed)
requests refill

## 2014-08-30 ENCOUNTER — Telehealth: Payer: Self-pay | Admitting: *Deleted

## 2014-08-30 ENCOUNTER — Ambulatory Visit: Payer: BC Managed Care – PPO | Admitting: Physician Assistant

## 2014-08-30 ENCOUNTER — Other Ambulatory Visit (HOSPITAL_BASED_OUTPATIENT_CLINIC_OR_DEPARTMENT_OTHER): Payer: BC Managed Care – PPO

## 2014-08-30 ENCOUNTER — Telehealth: Payer: Self-pay | Admitting: Medical Oncology

## 2014-08-30 ENCOUNTER — Ambulatory Visit (HOSPITAL_BASED_OUTPATIENT_CLINIC_OR_DEPARTMENT_OTHER): Payer: BC Managed Care – PPO | Admitting: Nurse Practitioner

## 2014-08-30 ENCOUNTER — Other Ambulatory Visit: Payer: BC Managed Care – PPO

## 2014-08-30 VITALS — BP 116/77 | HR 71 | Temp 98.3°F | Resp 18 | Ht 71.0 in | Wt 137.6 lb

## 2014-08-30 DIAGNOSIS — E876 Hypokalemia: Secondary | ICD-10-CM

## 2014-08-30 DIAGNOSIS — C3490 Malignant neoplasm of unspecified part of unspecified bronchus or lung: Secondary | ICD-10-CM

## 2014-08-30 DIAGNOSIS — C3431 Malignant neoplasm of lower lobe, right bronchus or lung: Secondary | ICD-10-CM

## 2014-08-30 DIAGNOSIS — C801 Malignant (primary) neoplasm, unspecified: Secondary | ICD-10-CM

## 2014-08-30 DIAGNOSIS — B3781 Candidal esophagitis: Secondary | ICD-10-CM

## 2014-08-30 DIAGNOSIS — E43 Unspecified severe protein-calorie malnutrition: Secondary | ICD-10-CM

## 2014-08-30 DIAGNOSIS — R634 Abnormal weight loss: Secondary | ICD-10-CM

## 2014-08-30 DIAGNOSIS — C73 Malignant neoplasm of thyroid gland: Secondary | ICD-10-CM

## 2014-08-30 DIAGNOSIS — E041 Nontoxic single thyroid nodule: Secondary | ICD-10-CM

## 2014-08-30 LAB — CBC WITH DIFFERENTIAL/PLATELET
BASO%: 0.6 % (ref 0.0–2.0)
BASOS ABS: 0 10*3/uL (ref 0.0–0.1)
EOS%: 5.1 % (ref 0.0–7.0)
Eosinophils Absolute: 0.2 10*3/uL (ref 0.0–0.5)
HEMATOCRIT: 37.7 % — AB (ref 38.4–49.9)
HEMOGLOBIN: 13.2 g/dL (ref 13.0–17.1)
LYMPH#: 1.4 10*3/uL (ref 0.9–3.3)
LYMPH%: 40.8 % (ref 14.0–49.0)
MCH: 32.8 pg (ref 27.2–33.4)
MCHC: 35 g/dL (ref 32.0–36.0)
MCV: 93.5 fL (ref 79.3–98.0)
MONO#: 0.1 10*3/uL (ref 0.1–0.9)
MONO%: 3.5 % (ref 0.0–14.0)
NEUT#: 1.7 10*3/uL (ref 1.5–6.5)
NEUT%: 50 % (ref 39.0–75.0)
PLATELETS: 255 10*3/uL (ref 140–400)
RBC: 4.03 10*6/uL — ABNORMAL LOW (ref 4.20–5.82)
RDW: 14.5 % (ref 11.0–14.6)
WBC: 3.3 10*3/uL — AB (ref 4.0–10.3)

## 2014-08-30 LAB — COMPREHENSIVE METABOLIC PANEL (CC13)
ALT: 16 U/L (ref 0–55)
ANION GAP: 9 meq/L (ref 3–11)
AST: 15 U/L (ref 5–34)
Albumin: 3.5 g/dL (ref 3.5–5.0)
Alkaline Phosphatase: 73 U/L (ref 40–150)
BILIRUBIN TOTAL: 1.48 mg/dL — AB (ref 0.20–1.20)
BUN: 13.2 mg/dL (ref 7.0–26.0)
CHLORIDE: 100 meq/L (ref 98–109)
CO2: 30 meq/L — AB (ref 22–29)
CREATININE: 0.7 mg/dL (ref 0.7–1.3)
Calcium: 9.7 mg/dL (ref 8.4–10.4)
Glucose: 109 mg/dl (ref 70–140)
Potassium: 2.9 mEq/L — CL (ref 3.5–5.1)
Sodium: 139 mEq/L (ref 136–145)
Total Protein: 6.5 g/dL (ref 6.4–8.3)

## 2014-08-30 MED ORDER — NYSTATIN 100000 UNIT/ML MT SUSP: 5.0000 mL | Freq: Four times a day (QID) | OROMUCOSAL | Status: DC

## 2014-08-30 MED ORDER — FLUCONAZOLE 100 MG PO TABS: 100.0000 mg | ORAL_TABLET | Freq: Every day | ORAL | Status: DC

## 2014-08-30 NOTE — Telephone Encounter (Signed)
rx given to pt

## 2014-08-30 NOTE — Telephone Encounter (Signed)
Walk in for labs and pick up rx. Reports difficulty swallowing food , getting stuck. He thinks he may have yeast in mouth. Recent treatment for same. Appt for APP

## 2014-08-30 NOTE — Telephone Encounter (Signed)
Called patient to make sure he picked up nystatin and diflucan prescription that [employer] sent in. Per patient, he did and will use as instructed. Pt states he will pick up potassium tomorrow because he ran out and pharmacy had to fill it. Let patient know that his potassium was very low at 2.9 and we recommend he pick it up and go ahead and take 27mEq tonight and not wait unitl tomorrow. Patient agreeable to this.

## 2014-08-30 NOTE — Progress Notes (Signed)
Quick Note:  Call patient with the result and order K Dur 40 meq po qd X 7 days. ______

## 2014-08-31 ENCOUNTER — Encounter: Payer: Self-pay | Admitting: Nurse Practitioner

## 2014-08-31 ENCOUNTER — Telehealth: Payer: Self-pay | Admitting: *Deleted

## 2014-08-31 DIAGNOSIS — E876 Hypokalemia: Secondary | ICD-10-CM | POA: Insufficient documentation

## 2014-08-31 NOTE — Assessment & Plan Note (Signed)
Patient does have a history of some chronic hyperbilirubinemia.  Bilirubin has increased from 0.63 up to 1.48.  Patient denies any new onset abdominal discomfort or cramping.  Will continue to monitor closely.

## 2014-08-31 NOTE — Assessment & Plan Note (Signed)
Patient received cycle 2 of his carboplatin/Alimta chemotherapy regimen on 08/22/2014.  He will be scheduled for cycle 3 of the same regimen on 09/13/2014.  He'll also continue to obtain weekly labs as well.

## 2014-08-31 NOTE — Progress Notes (Signed)
SYMPTOM MANAGEMENT CLINIC   HPI: Henry [name] 63 y.o. male diagnosed with metastatic adenocarcinoma of unknown primary; the questionable for upper gastrointestinal, pancreaticobiliary or primary lung cancer diagnosed in August 2015.  Currently undergoing carboplatin/Alimta chemotherapy regimen.  Patient called the cancer Center today requesting urgent care visit.  For complaint soft esophagitis.  He states that he has some mild oral mucosal sensitivity; but no oral lesions.  He is complaining of some stiff neck and throat discomfort when he attempts does swallow certain foods.  He denies issues of managing his secretions or any airway issues.  He denies any nausea or vomiting.  He does suffer with some chronic nausea; and has difficulty with multiple foods is a chronic problem.  He states that he is intolerant of most gluten and dairy products.  He states that there were only a few foods that he is actually able to eat with no difficulties.  He has been evaluated in the past by gastroenterologist; and is also planning of further evaluation with a possible second opinion per [facility] gastroenterology.  He has met with a nutritionist several times; spent feels that there is no new information he can learn from any further nutritional visits.  He states that he frequently researches his food issues on the Internet.  Patient denies any other new symptoms whatsoever.  He denies any recent fevers or chills.   HPI  CURRENT THERAPY: Upcoming Treatment Dates - LUNG Pemetrexed (Alimta) / Carboplatin q21d Days with orders from any treatment category:  09/13/2014      [facility] ON[facility] SCHEDULING COMMUNICATION      ondansetron (ZOFRAN) IVPB 16 mg      Dexamethasone Sodium Phosphate (DECADRON) injection 20 mg      PEMEtrexed (ALIMTA) 925 mg in sodium chloride 0.9 % 100 mL chemo infusion      CARBOplatin (PARAPLATIN) in sodium chloride 0.9 % 100 mL chemo infusion      sodium chloride 0.9 % injection 10 mL  heparin lock flush 100 unit/mL      heparin lock flush 100 unit/mL      alteplase (CATHFLO ACTIVASE) injection 2 mg      sodium chloride 0.9 % injection 3 mL      cyanocobalamin ((VITAMIN B-12)) injection 1,000 mcg      0.9 %  sodium chloride infusion      TREATMENT CONDITIONS 10/04/2014      [facility] ON[facility] SCHEDULING COMMUNICATION      ONDANSETRON IVPB ORDERABLE ([facility])      DEXAMETHASONE INJECTION ORDERABLE [facility]      PEMEtrexed (ALIMTA) 925 mg in sodium chloride 0.9 % 100 mL chemo infusion      CARBOplatin (PARAPLATIN) in sodium chloride 0.9 % 100 mL chemo infusion      SODIUM CHLORIDE 0.9 % IJ SOLN      HEPARIN SODIUM LOCK FLUSH 100 UNIT/ML IV SOLN      HEPARIN SODIUM LOCK FLUSH 100 UNIT/ML IV SOLN      ALTEPLASE 2 MG IJ SOLR      SODIUM CHLORIDE 0.9 % IJ SOLN      SODIUM CHLORIDE 0.9 % IV SOLN      TREATMENT CONDITIONS 10/25/2014      [facility] ON[facility] SCHEDULING COMMUNICATION      ONDANSETRON IVPB ORDERABLE ([facility])      DEXAMETHASONE INJECTION ORDERABLE [facility]      PEMEtrexed (ALIMTA) 925 mg in sodium chloride 0.9 % 100 mL chemo infusion      CARBOplatin (PARAPLATIN)  in sodium chloride 0.9 % 100 mL chemo infusion      SODIUM CHLORIDE 0.9 % IJ SOLN      HEPARIN SODIUM LOCK FLUSH 100 UNIT/ML IV SOLN      HEPARIN SODIUM LOCK FLUSH 100 UNIT/ML IV SOLN      ALTEPLASE 2 MG IJ SOLR      SODIUM CHLORIDE 0.9 % IJ SOLN      SODIUM CHLORIDE 0.9 % IV SOLN      TREATMENT CONDITIONS    ROS  Past Medical History  Diagnosis Date  . Hypertension   . GERD (gastroesophageal reflux disease)   . Hemochromatosis   . Hx of adenomatous colonic polyps   . Umbilical hernia     none since weight loss  . Diverticulosis   . Lumbar spondylosis     ankle  . Pleural effusion on right 12/16/13    per chest xray  . Shortness of breath     with  exertion  . Chronic pancreatitis   . IBS (irritable bowel syndrome)   . Lung cancer 06/2014  . 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 Lumbar laminectomy  1983  . Knee arthrsoscopy  2003    left  . Tonsillectomy  1962  . Eus N/A 04/07/2013    Procedure: UPPER ENDOSCOPIC ULTRASOUND (EUS) LINEAR;  Surgeon: [name], MD;  Location: [facility];  Service: Endoscopy;  Laterality: N/A;  . Video assisted thoracoscopy (vats)/decortication Right 06/22/2014    Procedure: VIDEO ASSISTED THORACOSCOPY (VATS)/DECORTICATION;  Surgeon: [name], MD;  Location: [place];  Service: Thoracic;  Laterality: Right;  . Pleural effusion drainage Right 06/22/2014    Procedure: DRAINAGE OF PLEURAL EFFUSION;  Surgeon: [name], MD;  Location: [place];  Service: Thoracic;  Laterality: Right;  . Eus N/A 07/20/2014    Procedure: UPPER ENDOSCOPIC ULTRASOUND (EUS) LINEAR;  Surgeon: [name], MD;  [facility];  Service: Endoscopy;  Laterality: N/A;    has HTN (hypertension); Hx of adenomatous colonic polyps; Hemochromatosis; Nonspecific (abnormal) findings on radiological and other examination of gastrointestinal tract; Unspecified gastritis and gastroduodenitis without mention of hemorrhage; Pleural effusion on right; Lung cancer; Protein-calorie malnutrition, severe; Thyroid nodule; Candida infection, esophageal; Hypokalemia; and Hyperbilirubinemia on his problem list.     has No Known Allergies.    Medication List       This list is accurate as of: 08/30/14 11:59 PM.  Always use your most recent med list.               albuterol 108 (90 BASE) MCG/ACT inhaler  Commonly known as:  PROVENTIL HFA;VENTOLIN HFA  Inhale 1-2 puffs into the lungs every 4 (four) hours as needed for wheezing or shortness of breath.     ALIGN 4 MG Caps  Take 4 mg by mouth daily.     dexamethasone 4 MG tablet  Commonly known as:  DECADRON  4 mg by mouth twice a day the day before, day of and day after the chemotherapy every 3 weeks.     diltiazem 360 MG 24 hr capsule  Commonly known as:  CARDIZEM CD  Take 360  mg by mouth every morning.     fluconazole 100 MG tablet  Commonly known as:  DIFLUCAN  Take 1 tablet (100 mg total) by mouth daily.     folic acid 1 MG tablet  Commonly known as:  FOLVITE  Take 1  tablet (1 mg total) by mouth daily.     hydrochlorothiazide 25 MG tablet  Commonly known as:  HYDRODIURIL  Take 25 mg by mouth every evening.     ibuprofen 200 MG tablet  Commonly known as:  ADVIL,MOTRIN  Take 400 mg by mouth every 6 (six) hours as needed (Pain).     megestrol 40 MG/ML suspension  Commonly known as:  MEGACE ORAL  Take 5 mLs (200 mg total) by mouth daily.     nebivolol 5 MG tablet  Commonly known as:  BYSTOLIC  Take 5 mg by mouth every evening.     nystatin 100000 UNIT/ML suspension  Commonly known as:  MYCOSTATIN  Take 5 mLs (500,000 Units total) by mouth 4 (four) times daily.     oxycodone 5 MG capsule  Commonly known as:  OXY-IR  Take 1 capsule (5 mg total) by mouth every 4 (four) hours as needed.     Pancrelipase (Lip-Prot-Amyl) 20880 UNITS Tabs  Take 2 tablets by mouth 3 (three) times daily with meals.     potassium chloride SA 20 MEQ tablet  Commonly known as:  K-DUR,KLOR-CON  Take 40 mEq by mouth daily.     prochlorperazine 10 MG tablet  Commonly known as:  COMPAZINE  Take 1 tablet (10 mg total) by mouth every 6 (six) hours as needed for nausea or vomiting.     tamsulosin 0.4 MG Caps capsule  Commonly known as:  FLOMAX     VOLTAREN 1 % Gel  Generic drug:  diclofenac sodium  Apply 2 g topically 3 (three) times a week. Used on ankle         PHYSICAL EXAMINATION  Blood pressure 116/77, pulse 71, temperature 98.3 F (36.8 C), temperature source Oral, resp. rate 18, height 5' 11"  (1.803 m), weight 137 lb 9.6 oz (62.415 kg), SpO2 100.00%.  Physical Exam  Nursing note and vitals reviewed. Constitutional: He is oriented to person, place, and time. Vital signs are normal. He appears malnourished and dehydrated. He appears unhealthy. He appears  cachectic.  HENT:  Head: Normocephalic and atraumatic.  Mouth/Throat: No oropharyngeal exudate.  Entire oral mucosa does appear slightly erythematosus; but no specific oral lesions noted.  No exudate or white coating to tongue.  Patient does appear to be managing his secretions fairly well; and airway is intact.  Eyes: Conjunctivae and EOM are normal. Pupils are equal, round, and reactive to light. Right eye exhibits no discharge. Left eye exhibits no discharge. No scleral icterus.  Neck: Normal range of motion. Neck supple. No JVD present. No tracheal deviation present. No thyromegaly present.  Cardiovascular: Normal rate, regular rhythm, normal heart sounds and intact distal pulses.   Pulmonary/Chest: Effort normal and breath sounds normal. No respiratory distress. He has no wheezes.  Abdominal: Soft. Bowel sounds are normal. He exhibits no distension and no mass. There is tenderness. There is no rebound and no guarding.  Musculoskeletal: Normal range of motion. He exhibits no edema and no tenderness.  Lymphadenopathy:    He has no cervical adenopathy.  Neurological: He is oriented to person, place, and time. Gait normal.  Skin: Skin is warm and dry. No rash noted. No erythema.  Psychiatric: Affect normal.    LABORATORY DATA:. Appointment on 08/30/2014  Component Date Value Ref Range Status  . WBC 08/30/2014 3.3* 4.0 - 10.3 10e3/uL Final  . NEUT# 08/30/2014 1.7  1.5 - 6.5 10e3/uL Final  . HGB 08/30/2014 13.2  13.0 - 17.1 g/dL Final  .  HCT 08/30/2014 37.7* 38.4 - 49.9 % Final  . Platelets 08/30/2014 255  140 - 400 10e3/uL Final  . MCV 08/30/2014 93.5  79.3 - 98.0 fL Final  . MCH 08/30/2014 32.8  27.2 - 33.4 pg Final  . MCHC 08/30/2014 35.0  32.0 - 36.0 g/dL Final  . RBC 08/30/2014 4.03* 4.20 - 5.82 10e6/uL Final  . RDW 08/30/2014 14.5  11.0 - 14.6 % Final  . lymph# 08/30/2014 1.4  0.9 - 3.3 10e3/uL Final  . MONO# 08/30/2014 0.1  0.1 - 0.9 10e3/uL Final  . Eosinophils Absolute  08/30/2014 0.2  0.0 - 0.5 10e3/uL Final  . Basophils Absolute 08/30/2014 0.0  0.0 - 0.1 10e3/uL Final  . NEUT% 08/30/2014 50.0  39.0 - 75.0 % Final  . LYMPH% 08/30/2014 40.8  14.0 - 49.0 % Final  . MONO% 08/30/2014 3.5  0.0 - 14.0 % Final  . EOS% 08/30/2014 5.1  0.0 - 7.0 % Final  . BASO% 08/30/2014 0.6  0.0 - 2.0 % Final  . Sodium 08/30/2014 139  136 - 145 mEq/L Final  . Potassium 08/30/2014 2.9* 3.5 - 5.1 mEq/L Final  . Chloride 08/30/2014 100  98 - 109 mEq/L Final  . CO2 08/30/2014 30* 22 - 29 mEq/L Final  . Glucose 08/30/2014 109  70 - 140 mg/dl Final  . BUN 08/30/2014 13.2  7.0 - 26.0 mg/dL Final  . Creatinine 08/30/2014 0.7  0.7 - 1.3 mg/dL Final  . Total Bilirubin 08/30/2014 1.48* 0.20 - 1.20 mg/dL Final  . Alkaline Phosphatase 08/30/2014 73  40 - 150 U/L Final  . AST 08/30/2014 15  5 - 34 U/L Final  . ALT 08/30/2014 16  0 - 55 U/L Final  . Total Protein 08/30/2014 6.5  6.4 - 8.3 g/dL Final  . Albumin 08/30/2014 3.5  3.5 - 5.0 g/dL Final  . Calcium 08/30/2014 9.7  8.4 - 10.4 mg/dL Final  . Anion Gap 08/30/2014 9  3 - 11 mEq/L Final     RADIOGRAPHIC STUDIES: No results found.  ASSESSMENT/PLAN:    Lung cancer  Assessment & Plan Patient received cycle 2 of his carboplatin/Alimta chemotherapy regimen on 08/22/2014.  He will be scheduled for cycle 3 of the same regimen on 09/13/2014.  He'll also continue to obtain weekly labs as well.   Protein-calorie malnutrition, severe  Assessment & Plan Patient continues with minimal appetite and continues to lose weight.  He has a chronic history of diarrhea and is intolerant of multiple foods.  He avoids most gluten and dairy products.  He has met with the [place] nutritionist several times; and feels there is no reasoning to meet with him again regarding nutrition information.  Patient states that he plans to start cooking more gluten free meals at home; in hopes that this will help him gain weight.   Thyroid  nodule  Assessment & Plan Patient has been referred to Gen. surgery for evaluation and consideration of resection of his left thyroid nodule.  This could be performed after completion  of his chemotherapy for the metastatic adenocarcinoma.   Candida infection, esophageal  Assessment & Plan Patient is complaining of some mild sensitivity to his oral mucosa; and is complaining of more significant discomfort at to his throat when swallowing foods.  He denies any issues in managing his secretions.  Quite possibly-the patient has developed chemotherapy-induced Candida esophagitis.  Patient was prescribed nystatin swish and swallow; as well as Diflucan 100 mg daily.  He was also encouraged  to continue with either baking soda/salt swish and spit or biotin mouth rinse.   Hypokalemia  Assessment & Plan Patient's potassium today was 2.9.  Patient states that he was previously prescribed potassium tablets; but he ran out off his potassium tablets of several days ago.  Patient states that he has a refill available at the pharmacy that he will pick up and start taking it again today.  Advised patient that it was important for him to start his potassium tablets this evening; since his potassium was considered fairly low.  Patient stated understanding of instructions.   Hyperbilirubinemia  Assessment & Plan Patient does have a history of some chronic hyperbilirubinemia.  Bilirubin has increased from 0.63 up to 1.48.  Patient denies any new onset abdominal discomfort or cramping.  Will continue to monitor closely.   Patient stated understanding of all instructions; and was in agreement with this plan of care. The patient knows to call the clinic with any problems, questions or concerns.   Review/collaboration with Dr. Julien Nordmann  regarding all aspects of patient's visit today.   Total time spent with patient was 25  minutes;  with greater than 75  percent of that time spent in face to face counseling  regarding his symptoms,  and coordination of care and follow up.  Disclaimer: This note was dictated with voice recognition software. Similar sounding words can inadvertently be transcribed and may not be corrected upon review.   Drue Second, NP 08/31/2014

## 2014-08-31 NOTE — Assessment & Plan Note (Signed)
Patient has been referred to Gen. surgery for evaluation and consideration of resection of his left thyroid nodule.  This could be performed after completion  of his chemotherapy for the metastatic adenocarcinoma.

## 2014-08-31 NOTE — Assessment & Plan Note (Signed)
Patient continues with minimal appetite and continues to lose weight.  He has a chronic history of diarrhea and is intolerant of multiple foods.  He avoids most gluten and dairy products.  He has met with the [place] nutritionist several times; and feels there is no reasoning to meet with him again regarding nutrition information.  Patient states that he plans to start cooking more gluten free meals at home; in hopes that this will help him gain weight.

## 2014-08-31 NOTE — Assessment & Plan Note (Signed)
Patient is complaining of some mild sensitivity to his oral mucosa; and is complaining of more significant discomfort at to his throat when swallowing foods.  He denies any issues in managing his secretions.  Quite possibly-the patient has developed chemotherapy-induced Candida esophagitis.  Patient was prescribed nystatin swish and swallow; as well as Diflucan 100 mg daily.  He was also encouraged to continue with either baking soda/salt swish and spit or biotin mouth rinse.

## 2014-08-31 NOTE — Telephone Encounter (Signed)
Spoke with Henry [name].  Reports his mouth and swallowing are most improved.  No new symptoms or side effects to report at this time.

## 2014-08-31 NOTE — Assessment & Plan Note (Signed)
Patient's potassium today was 2.9.  Patient states that he was previously prescribed potassium tablets; but he ran out off his potassium tablets of several days ago.  Patient states that he has a refill available at the pharmacy that he will pick up and start taking it again today.  Advised patient that it was important for him to start his potassium tablets this evening; since his potassium was considered fairly low.  Patient stated understanding of instructions.

## 2014-09-06 ENCOUNTER — Other Ambulatory Visit (HOSPITAL_BASED_OUTPATIENT_CLINIC_OR_DEPARTMENT_OTHER): Payer: BC Managed Care – PPO

## 2014-09-06 DIAGNOSIS — C3431 Malignant neoplasm of lower lobe, right bronchus or lung: Secondary | ICD-10-CM

## 2014-09-06 DIAGNOSIS — E876 Hypokalemia: Secondary | ICD-10-CM

## 2014-09-06 DIAGNOSIS — C801 Malignant (primary) neoplasm, unspecified: Secondary | ICD-10-CM

## 2014-09-06 LAB — CBC WITH DIFFERENTIAL/PLATELET
BASO%: 0.2 % (ref 0.0–2.0)
BASOS ABS: 0 10*3/uL (ref 0.0–0.1)
EOS ABS: 0.1 10*3/uL (ref 0.0–0.5)
EOS%: 1.8 % (ref 0.0–7.0)
HCT: 31.8 % — ABNORMAL LOW (ref 38.4–49.9)
HEMOGLOBIN: 10.9 g/dL — AB (ref 13.0–17.1)
LYMPH#: 1.3 10*3/uL (ref 0.9–3.3)
LYMPH%: 19.1 % (ref 14.0–49.0)
MCH: 32.5 pg (ref 27.2–33.4)
MCHC: 34.4 g/dL (ref 32.0–36.0)
MCV: 94.4 fL (ref 79.3–98.0)
MONO#: 1 10*3/uL — ABNORMAL HIGH (ref 0.1–0.9)
MONO%: 15.2 % — ABNORMAL HIGH (ref 0.0–14.0)
NEUT%: 63.7 % (ref 39.0–75.0)
NEUTROS ABS: 4.2 10*3/uL (ref 1.5–6.5)
Platelets: 96 10*3/uL — ABNORMAL LOW (ref 140–400)
RBC: 3.37 10*6/uL — ABNORMAL LOW (ref 4.20–5.82)
RDW: 14.5 % (ref 11.0–14.6)
WBC: 6.7 10*3/uL (ref 4.0–10.3)

## 2014-09-06 LAB — COMPREHENSIVE METABOLIC PANEL (CC13)
ALT: 11 U/L (ref 0–55)
ANION GAP: 7 meq/L (ref 3–11)
AST: 11 U/L (ref 5–34)
Albumin: 3.2 g/dL — ABNORMAL LOW (ref 3.5–5.0)
Alkaline Phosphatase: 66 U/L (ref 40–150)
BUN: 8.5 mg/dL (ref 7.0–26.0)
CALCIUM: 9.3 mg/dL (ref 8.4–10.4)
CHLORIDE: 103 meq/L (ref 98–109)
CO2: 28 meq/L (ref 22–29)
CREATININE: 0.7 mg/dL (ref 0.7–1.3)
GLUCOSE: 128 mg/dL (ref 70–140)
Potassium: 3.7 mEq/L (ref 3.5–5.1)
Sodium: 138 mEq/L (ref 136–145)
Total Bilirubin: 1.21 mg/dL — ABNORMAL HIGH (ref 0.20–1.20)
Total Protein: 6.3 g/dL — ABNORMAL LOW (ref 6.4–8.3)

## 2014-09-13 ENCOUNTER — Ambulatory Visit: Payer: BC Managed Care – PPO

## 2014-09-13 ENCOUNTER — Ambulatory Visit (HOSPITAL_BASED_OUTPATIENT_CLINIC_OR_DEPARTMENT_OTHER): Payer: BC Managed Care – PPO | Admitting: Internal Medicine

## 2014-09-13 ENCOUNTER — Other Ambulatory Visit (HOSPITAL_BASED_OUTPATIENT_CLINIC_OR_DEPARTMENT_OTHER): Payer: BC Managed Care – PPO

## 2014-09-13 ENCOUNTER — Other Ambulatory Visit: Payer: BC Managed Care – PPO

## 2014-09-13 VITALS — BP 129/81 | HR 72 | Temp 97.3°F | Resp 18 | Ht 71.0 in | Wt 141.9 lb

## 2014-09-13 DIAGNOSIS — E041 Nontoxic single thyroid nodule: Secondary | ICD-10-CM

## 2014-09-13 DIAGNOSIS — E46 Unspecified protein-calorie malnutrition: Secondary | ICD-10-CM

## 2014-09-13 DIAGNOSIS — C3431 Malignant neoplasm of lower lobe, right bronchus or lung: Secondary | ICD-10-CM

## 2014-09-13 DIAGNOSIS — C801 Malignant (primary) neoplasm, unspecified: Secondary | ICD-10-CM

## 2014-09-13 DIAGNOSIS — R634 Abnormal weight loss: Secondary | ICD-10-CM

## 2014-09-13 LAB — CBC WITH DIFFERENTIAL/PLATELET
BASO%: 0.1 % (ref 0.0–2.0)
Basophils Absolute: 0 10*3/uL (ref 0.0–0.1)
EOS ABS: 0 10*3/uL (ref 0.0–0.5)
EOS%: 0.3 % (ref 0.0–7.0)
HCT: 31.6 % — ABNORMAL LOW (ref 38.4–49.9)
HEMOGLOBIN: 11.3 g/dL — AB (ref 13.0–17.1)
LYMPH%: 22.1 % (ref 14.0–49.0)
MCH: 33.3 pg (ref 27.2–33.4)
MCHC: 35.8 g/dL (ref 32.0–36.0)
MCV: 93.2 fL (ref 79.3–98.0)
MONO#: 1.2 10*3/uL — ABNORMAL HIGH (ref 0.1–0.9)
MONO%: 13.3 % (ref 0.0–14.0)
NEUT%: 64.2 % (ref 39.0–75.0)
NEUTROS ABS: 5.6 10*3/uL (ref 1.5–6.5)
Platelets: 406 10*3/uL — ABNORMAL HIGH (ref 140–400)
RBC: 3.39 10*6/uL — AB (ref 4.20–5.82)
RDW: 16.5 % — AB (ref 11.0–14.6)
WBC: 8.8 10*3/uL (ref 4.0–10.3)
lymph#: 1.9 10*3/uL (ref 0.9–3.3)

## 2014-09-13 LAB — COMPREHENSIVE METABOLIC PANEL (CC13)
ALK PHOS: 72 U/L (ref 40–150)
ALT: 13 U/L (ref 0–55)
AST: 14 U/L (ref 5–34)
Albumin: 3.3 g/dL — ABNORMAL LOW (ref 3.5–5.0)
Anion Gap: 8 mEq/L (ref 3–11)
BUN: 14 mg/dL (ref 7.0–26.0)
CALCIUM: 9.4 mg/dL (ref 8.4–10.4)
CO2: 24 mEq/L (ref 22–29)
Chloride: 108 mEq/L (ref 98–109)
Creatinine: 0.7 mg/dL (ref 0.7–1.3)
Glucose: 110 mg/dl (ref 70–140)
Potassium: 4.1 mEq/L (ref 3.5–5.1)
SODIUM: 139 meq/L (ref 136–145)
TOTAL PROTEIN: 6.5 g/dL (ref 6.4–8.3)
Total Bilirubin: 0.45 mg/dL (ref 0.20–1.20)

## 2014-09-13 NOTE — Progress Notes (Signed)
[place] Telephone:([phone]) [phone]   Fax:(336) [facility] [place] [address]  DIAGNOSIS: Metastatic adenocarcinoma of unknown primary but questionable for upper gastrointestinal, pancreaticobiliary or primary lung cancer, diagnosed in August of 2015.  Genomic Alterations Identified? FGFR2 A97T KRAS Q61H - subclonal? ARID1A [id number]* - subclonal?  PRIOR THERAPY: Status post right video-assisted thoracoscopy, drainage of pleural effusion, pleural biopsy, partial decortication, talc pleurodesis.under the care of Dr. Roxan Hockey on 06/23/2014.  CURRENT THERAPY: systemic chemotherapy with carboplatin for AUC of 5 and Alimta 500 mg/M2 every 3 weeks. He is status post 2 cycles.  INTERVAL HISTORY: Henry [name] 63 y.o. male returns to the clinic today for followup visit. The patient is feeling fine today with no specific complaints. He is tolerating her systemic chemotherapy with carboplatin and Alimta fairly well. He has some dental issues that need refill and crown. He would like to proceed with his dental procedure as soon as possible because it is bothering him. He has no nausea or vomiting, no fever or chills.he denied having any significant chest pain, shortness of breath , cough or hemoptysis.   MEDICAL HISTORY: Past Medical History  Diagnosis Date  . Hypertension   . GERD (gastroesophageal reflux disease)   . Hemochromatosis   . Hx of adenomatous colonic polyps   . Umbilical hernia     none since weight loss  . Diverticulosis   . Lumbar spondylosis     ankle  . Pleural effusion on right 12/16/13    per chest xray  . Shortness of breath     with  exertion  . Chronic pancreatitis   . IBS (irritable bowel syndrome)   . Lung cancer 06/2014  . 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  MEDICATIONS:  Current Outpatient Prescriptions  Medication Sig  Dispense Refill  . albuterol (PROVENTIL HFA;VENTOLIN HFA) 108 (90 BASE) MCG/ACT inhaler Inhale 1-2 puffs into the lungs every 4 (four) hours as needed for wheezing or shortness of breath.       . dexamethasone (DECADRON) 4 MG tablet 4 mg by mouth twice a day the day before, day of and day after the chemotherapy every 3 weeks.  40 tablet  1  . diltiazem (CARDIZEM CD) 360 MG 24 hr capsule Take 360 mg by mouth every morning.       . fluconazole (DIFLUCAN) 100 MG tablet Take 1 tablet (100 mg total) by mouth daily.  14 tablet  0  . folic acid (FOLVITE) 1 MG tablet Take 1 tablet (1 mg total) by mouth daily.  30 tablet  3  . hydrochlorothiazide (HYDRODIURIL) 25 MG tablet Take 25 mg by mouth every evening.       Marland Kitchen ibuprofen (ADVIL,MOTRIN) 200 MG tablet Take 400 mg by mouth every 6 (six) hours as needed (Pain).      . megestrol (MEGACE ORAL) 40 MG/ML suspension Take 5 mLs (200 mg total) by mouth daily.  240 mL  1  . nebivolol (BYSTOLIC) 5 MG tablet Take 5 mg by mouth every evening.       . nystatin (MYCOSTATIN) 100000 UNIT/ML suspension Take 5 mLs (500,000 Units total) by mouth 4 (four) times daily.  60 mL  0  . oxycodone (OXY-IR) 5 MG capsule Take 1 capsule (5 mg total) by mouth every 4 (four) hours as needed.  30 capsule  0  . Pancrelipase, Lip-Prot-Amyl,  20880 UNITS TABS Take 2 tablets by mouth 3 (three) times daily with meals.  540 tablet  2  . potassium chloride SA (K-DUR,KLOR-CON) 20 MEQ tablet Take 40 mEq by mouth daily.       . Probiotic Product (ALIGN) 4 MG CAPS Take 4 mg by mouth daily.       . prochlorperazine (COMPAZINE) 10 MG tablet Take 1 tablet (10 mg total) by mouth every 6 (six) hours as needed for nausea or vomiting.  30 tablet  0  . tamsulosin (FLOMAX) 0.4 MG CAPS capsule       . VOLTAREN 1 % GEL Apply 2 g topically 3 (three) times a week. Used on ankle       No current facility-administered medications for this visit.    SURGICAL HISTORY:  Past Surgical History  Procedure  Laterality Date  . Lumbar laminectomy  1983  . Knee arthrsoscopy  2003    left  . Tonsillectomy  1962  . Eus N/A 04/07/2013    Procedure: UPPER ENDOSCOPIC ULTRASOUND (EUS) LINEAR;  Surgeon: [name], MD;  Location[facility];  Service: Endoscopy;  Laterality: N/A;  . Video assisted thoracoscopy (vats)/decortication Right 06/22/2014    Procedure: VIDEO ASSISTED THORACOSCOPY (VATS)/DECORTICATION;  Surgeon: [name], MD;  Location: [place];  Service: Thoracic;  Laterality: Right;  . Pleural effusion drainage Right 06/22/2014    Procedure: DRAINAGE OF PLEURAL EFFUSION;  Surgeon: [name], MD;  Location: [place];  Service: Thoracic;  Laterality: Right;  . Eus N/A 07/20/2014    Procedure: UPPER ENDOSCOPIC ULTRASOUND (EUS) LINEAR;  Surgeon: [name], MD;  [facility];  Service: Endoscopy;  Laterality: N/A;    REVIEW OF SYSTEMS:  A comprehensive review of systems was negative except for: Constitutional: positive for fatigue   PHYSICAL EXAMINATION: General appearance: alert, cooperative, fatigued and no distress Head: Normocephalic, without obvious abnormality, atraumatic Neck: no adenopathy, no JVD, supple, symmetrical, trachea midline and thyroid not enlarged, symmetric, no tenderness/mass/nodules Lymph nodes: Cervical, supraclavicular, and axillary nodes normal. Resp: clear to auscultation bilaterally Back: symmetric, no curvature. ROM normal. No CVA tenderness. Cardio: regular rate and rhythm, S1, S2 normal, no murmur, click, rub or gallop GI: soft, non-tender; bowel sounds normal; no masses,  no organomegaly Extremities: extremities normal, atraumatic, no cyanosis or edema Neurologic: Alert and oriented X 3, normal strength and tone. Normal symmetric reflexes. Normal coordination and gait  ECOG PERFORMANCE STATUS: 1 - Symptomatic but completely ambulatory  Blood pressure 129/81, pulse 72, temperature 97.3 F (36.3 C), resp. rate 18, height _0   (1.803 m), weight 141 lb 14.4 oz (64.365 kg), SpO2 100.00%.  LABORATORY DATA: Lab Results  Component Value Date   WBC 8.8 09/13/2014   HGB 11.3* 09/13/2014   HCT 31.6* 09/13/2014   MCV 93.2 09/13/2014   PLT 406* 09/13/2014      Chemistry      Component Value Date/Time   NA 138 09/06/2014 1026   NA 140 06/24/2014 0346   K 3.7 09/06/2014 1026   K 3.3* 06/24/2014 0346   CL 102 06/24/2014 0346   CO2 28 09/06/2014 1026   CO2 28 06/24/2014 0346   BUN 8.5 09/06/2014 1026   BUN 11 06/24/2014 0346   CREATININE 0.7 09/06/2014 1026   CREATININE 0.75 06/24/2014 0346      Component Value Date/Time   CALCIUM 9.3 09/06/2014 1026   CALCIUM 8.6 06/24/2014 0346   ALKPHOS 66 09/06/2014 1026   ALKPHOS 54 06/24/2014  0346   AST 11 09/06/2014 1026   AST 13 06/24/2014 0346   ALT 11 09/06/2014 1026   ALT 11 06/24/2014 0346   BILITOT 1.21* 09/06/2014 1026   BILITOT 0.6 06/24/2014 0346       RADIOGRAPHIC STUDIES:  ASSESSMENT AND PLAN: This is a very pleasant 63 years old white male with   1) metastatic adenocarcinoma of unknown primary questionable to be lung cancer versus hepatobiliary. The molecular studies that became available after the visit showed no EGFR mutation or ALK gene translocation. The gastrointestinal workup performed by Dr. Yates Decamp was unremarkable for any malignancy and the esophagus, stomach or pancreas. He is currently undergoing systemic chemotherapy with carboplatin and Alimta status post 2 cycles and tolerating his treatment fairly well. We will delay the start of cycle #3 until 09/25/2014 because of his dental issues and he is seeing his dentist early next week for evaluation. The patient would come back for followup visit in one month for reevaluation after repeating CT scan of the chest, abdomen and pelvis for restaging of his disease.  2) left thyroid nodule papillary thyroid carcinoma: he is scheduled to see his surgery tomorrow for evaluation.  3) weight loss and malnutrition: his  weight is currently stable.  He was advised to call immediately if he has any concerning symptoms in the interval. The patient voices understanding of current disease status and treatment options and is in agreement with the current care plan.  All questions were answered. The patient knows to call the clinic with any problems, questions or concerns. We can certainly see the patient much sooner if necessary.  Disclaimer: This note was dictated with voice recognition software. Similar sounding words can inadvertently be transcribed and may not be corrected upon review.

## 2014-09-15 ENCOUNTER — Telehealth: Payer: Self-pay | Admitting: *Deleted

## 2014-09-15 ENCOUNTER — Telehealth: Payer: Self-pay | Admitting: Internal Medicine

## 2014-09-15 NOTE — Telephone Encounter (Signed)
Per staff message and POF I have scheduled appts. Advised scheduler of appts. JMW  

## 2014-09-15 NOTE — Telephone Encounter (Signed)
Sent msg to [facility] to r/s labs/chemo per pt no Mondays r/s to Wed sent msg to chemo ok per MD, mailed out sch to pt..... KJ

## 2014-09-18 ENCOUNTER — Telehealth: Payer: Self-pay | Admitting: *Deleted

## 2014-09-18 NOTE — Telephone Encounter (Signed)
Received call from Loraine at Dr Toney Sang' DDS office.  He is going to be doing a crown prep and questionable filling today and they want to make sure this is okay with Dr Vista Mink.  Okay per Dr Vista Mink.  Called and informed Meehgan call # [phone].  Meehgan verbalized understanding.

## 2014-09-25 ENCOUNTER — Other Ambulatory Visit: Payer: BC Managed Care – PPO

## 2014-09-27 ENCOUNTER — Other Ambulatory Visit (HOSPITAL_BASED_OUTPATIENT_CLINIC_OR_DEPARTMENT_OTHER): Payer: BC Managed Care – PPO

## 2014-09-27 ENCOUNTER — Ambulatory Visit (HOSPITAL_BASED_OUTPATIENT_CLINIC_OR_DEPARTMENT_OTHER): Payer: BC Managed Care – PPO

## 2014-09-27 DIAGNOSIS — C801 Malignant (primary) neoplasm, unspecified: Secondary | ICD-10-CM

## 2014-09-27 DIAGNOSIS — C3431 Malignant neoplasm of lower lobe, right bronchus or lung: Secondary | ICD-10-CM

## 2014-09-27 DIAGNOSIS — Z5111 Encounter for antineoplastic chemotherapy: Secondary | ICD-10-CM

## 2014-09-27 LAB — CBC WITH DIFFERENTIAL/PLATELET
BASO%: 1.1 % (ref 0.0–2.0)
Basophils Absolute: 0.1 10*3/uL (ref 0.0–0.1)
EOS ABS: 0 10*3/uL (ref 0.0–0.5)
EOS%: 0.4 % (ref 0.0–7.0)
HEMATOCRIT: 41.6 % (ref 38.4–49.9)
HGB: 13.6 g/dL (ref 13.0–17.1)
LYMPH#: 2.1 10*3/uL (ref 0.9–3.3)
LYMPH%: 18.1 % (ref 14.0–49.0)
MCH: 33.4 pg (ref 27.2–33.4)
MCHC: 32.7 g/dL (ref 32.0–36.0)
MCV: 102.1 fL — ABNORMAL HIGH (ref 79.3–98.0)
MONO#: 1.1 10*3/uL — AB (ref 0.1–0.9)
MONO%: 9.7 % (ref 0.0–14.0)
NEUT%: 70.7 % (ref 39.0–75.0)
NEUTROS ABS: 8.2 10*3/uL — AB (ref 1.5–6.5)
Platelets: 264 10*3/uL (ref 140–400)
RBC: 4.07 10*6/uL — ABNORMAL LOW (ref 4.20–5.82)
RDW: 19.9 % — AB (ref 11.0–14.6)
WBC: 11.6 10*3/uL — ABNORMAL HIGH (ref 4.0–10.3)

## 2014-09-27 LAB — COMPREHENSIVE METABOLIC PANEL (CC13)
ALT: 14 U/L (ref 0–55)
ANION GAP: 8 meq/L (ref 3–11)
AST: 17 U/L (ref 5–34)
Albumin: 3.7 g/dL (ref 3.5–5.0)
Alkaline Phosphatase: 79 U/L (ref 40–150)
BUN: 11.2 mg/dL (ref 7.0–26.0)
CO2: 26 meq/L (ref 22–29)
CREATININE: 0.7 mg/dL (ref 0.7–1.3)
Calcium: 9.4 mg/dL (ref 8.4–10.4)
Chloride: 107 mEq/L (ref 98–109)
GLUCOSE: 110 mg/dL (ref 70–140)
POTASSIUM: 3.5 meq/L (ref 3.5–5.1)
Sodium: 141 mEq/L (ref 136–145)
Total Bilirubin: 0.94 mg/dL (ref 0.20–1.20)
Total Protein: 6.7 g/dL (ref 6.4–8.3)

## 2014-09-27 MED ORDER — CYANOCOBALAMIN 1000 MCG/ML IJ SOLN
INTRAMUSCULAR | Status: AC
  Filled 2014-09-27: qty 1

## 2014-09-27 MED ORDER — ONDANSETRON 16 MG/50ML IVPB (CHCC)
INTRAVENOUS | Status: AC
  Filled 2014-09-27: qty 16

## 2014-09-27 MED ORDER — DEXAMETHASONE SODIUM PHOSPHATE 20 MG/5ML IJ SOLN
INTRAMUSCULAR | Status: AC
  Filled 2014-09-27: qty 5

## 2014-09-27 MED ORDER — SODIUM CHLORIDE 0.9 % IV SOLN
500.0000 mg/m2 | Freq: Once | INTRAVENOUS | Status: AC
  Administered 2014-09-27: 925 mg via INTRAVENOUS
  Filled 2014-09-27: qty 37

## 2014-09-27 MED ORDER — SODIUM CHLORIDE 0.9 % IV SOLN
Freq: Once | INTRAVENOUS | Status: AC
  Administered 2014-09-27: 12:00:00 via INTRAVENOUS

## 2014-09-27 MED ORDER — CYANOCOBALAMIN 1000 MCG/ML IJ SOLN
1000.0000 ug | Freq: Once | INTRAMUSCULAR | Status: AC
  Administered 2014-09-27: 1000 ug via INTRAMUSCULAR

## 2014-09-27 MED ORDER — SODIUM CHLORIDE 0.9 % IV SOLN
565.5000 mg | Freq: Once | INTRAVENOUS | Status: AC
  Administered 2014-09-27: 570 mg via INTRAVENOUS
  Filled 2014-09-27: qty 57

## 2014-09-27 MED ORDER — DEXAMETHASONE SODIUM PHOSPHATE 20 MG/5ML IJ SOLN
20.0000 mg | Freq: Once | INTRAMUSCULAR | Status: AC
  Administered 2014-09-27: 20 mg via INTRAVENOUS

## 2014-09-27 MED ORDER — ONDANSETRON 16 MG/50ML IVPB (CHCC)
16.0000 mg | Freq: Once | INTRAVENOUS | Status: AC
  Administered 2014-09-27: 16 mg via INTRAVENOUS

## 2014-09-27 NOTE — Patient Instructions (Signed)
[facility] Discharge Instructions for Patients Receiving Chemotherapy  Today you received the following chemotherapy agents Alimta/Carboplatin.   To help prevent nausea and vomiting after your treatment, we encourage you to take your nausea medication as directed.    If you develop nausea and vomiting that is not controlled by your nausea medication, call the clinic.   BELOW ARE SYMPTOMS THAT SHOULD BE REPORTED IMMEDIATELY:  *FEVER GREATER THAN 100.5 F  *CHILLS WITH OR WITHOUT FEVER  NAUSEA AND VOMITING THAT IS NOT CONTROLLED WITH YOUR NAUSEA MEDICATION  *UNUSUAL SHORTNESS OF BREATH  *UNUSUAL BRUISING OR BLEEDING  TENDERNESS IN MOUTH AND THROAT WITH OR WITHOUT PRESENCE OF ULCERS  *URINARY PROBLEMS  *BOWEL PROBLEMS  UNUSUAL RASH Items with * indicate a potential emergency and should be followed up as soon as possible.  Feel free to call the clinic you have any questions or concerns. The clinic phone number is [phone].

## 2014-10-04 ENCOUNTER — Other Ambulatory Visit (HOSPITAL_BASED_OUTPATIENT_CLINIC_OR_DEPARTMENT_OTHER): Payer: BC Managed Care – PPO

## 2014-10-04 DIAGNOSIS — C801 Malignant (primary) neoplasm, unspecified: Secondary | ICD-10-CM

## 2014-10-04 DIAGNOSIS — C3431 Malignant neoplasm of lower lobe, right bronchus or lung: Secondary | ICD-10-CM

## 2014-10-04 LAB — COMPREHENSIVE METABOLIC PANEL (CC13)
ALK PHOS: 76 U/L (ref 40–150)
ALT: 26 U/L (ref 0–55)
AST: 32 U/L (ref 5–34)
Albumin: 3.5 g/dL (ref 3.5–5.0)
Anion Gap: 9 mEq/L (ref 3–11)
BUN: 12.7 mg/dL (ref 7.0–26.0)
CO2: 28 mEq/L (ref 22–29)
Calcium: 9 mg/dL (ref 8.4–10.4)
Chloride: 104 mEq/L (ref 98–109)
Creatinine: 0.7 mg/dL (ref 0.7–1.3)
GLUCOSE: 114 mg/dL (ref 70–140)
Potassium: 3.5 mEq/L (ref 3.5–5.1)
SODIUM: 141 meq/L (ref 136–145)
TOTAL PROTEIN: 6 g/dL — AB (ref 6.4–8.3)
Total Bilirubin: 1.23 mg/dL — ABNORMAL HIGH (ref 0.20–1.20)

## 2014-10-04 LAB — CBC WITH DIFFERENTIAL/PLATELET
BASO%: 1 % (ref 0.0–2.0)
Basophils Absolute: 0 10*3/uL (ref 0.0–0.1)
EOS ABS: 0.2 10*3/uL (ref 0.0–0.5)
EOS%: 6.3 % (ref 0.0–7.0)
HCT: 32.9 % — ABNORMAL LOW (ref 38.4–49.9)
HGB: 11.2 g/dL — ABNORMAL LOW (ref 13.0–17.1)
LYMPH%: 31.8 % (ref 14.0–49.0)
MCH: 34.5 pg — AB (ref 27.2–33.4)
MCHC: 34 g/dL (ref 32.0–36.0)
MCV: 101.6 fL — AB (ref 79.3–98.0)
MONO#: 0.3 10*3/uL (ref 0.1–0.9)
MONO%: 7.6 % (ref 0.0–14.0)
NEUT%: 53.3 % (ref 39.0–75.0)
NEUTROS ABS: 2.1 10*3/uL (ref 1.5–6.5)
PLATELETS: 152 10*3/uL (ref 140–400)
RBC: 3.23 10*6/uL — AB (ref 4.20–5.82)
RDW: 17.5 % — ABNORMAL HIGH (ref 11.0–14.6)
WBC: 3.9 10*3/uL — ABNORMAL LOW (ref 4.0–10.3)
lymph#: 1.2 10*3/uL (ref 0.9–3.3)

## 2014-10-10 ENCOUNTER — Telehealth: Payer: Self-pay | Admitting: *Deleted

## 2014-10-10 NOTE — Telephone Encounter (Signed)
Pt called stating that his left middle finger appears to be infected.  It is red, swollen from where he had a cut.  He has not been using any kind of antibiotic ointment.  Per Dr Vista Mink, try using polysporin or neosporin.  Called pt back, no answer, left msg with instructions and to call back if his fingers gets worse.

## 2014-10-11 ENCOUNTER — Other Ambulatory Visit: Payer: BC Managed Care – PPO

## 2014-10-13 ENCOUNTER — Ambulatory Visit (HOSPITAL_COMMUNITY): Payer: BC Managed Care – PPO

## 2014-10-13 ENCOUNTER — Other Ambulatory Visit: Payer: BC Managed Care – PPO

## 2014-10-16 ENCOUNTER — Ambulatory Visit: Payer: BC Managed Care – PPO | Admitting: Internal Medicine

## 2014-10-16 ENCOUNTER — Other Ambulatory Visit: Payer: BC Managed Care – PPO

## 2014-10-18 ENCOUNTER — Telehealth: Payer: Self-pay | Admitting: *Deleted

## 2014-10-18 ENCOUNTER — Other Ambulatory Visit (HOSPITAL_BASED_OUTPATIENT_CLINIC_OR_DEPARTMENT_OTHER): Payer: BC Managed Care – PPO

## 2014-10-18 ENCOUNTER — Ambulatory Visit (HOSPITAL_COMMUNITY)
Admission: RE | Admit: 2014-10-18 | Discharge: 2014-10-18 | Disposition: A | Discharge status: Home / Self Care | Payer: BC Managed Care – PPO | Source: Ambulatory Visit | Attending: Internal Medicine | Admitting: Internal Medicine

## 2014-10-18 ENCOUNTER — Ambulatory Visit (HOSPITAL_BASED_OUTPATIENT_CLINIC_OR_DEPARTMENT_OTHER): Payer: BC Managed Care – PPO | Admitting: Internal Medicine

## 2014-10-18 ENCOUNTER — Encounter: Payer: Self-pay | Admitting: Internal Medicine

## 2014-10-18 ENCOUNTER — Telehealth: Payer: Self-pay | Admitting: Internal Medicine

## 2014-10-18 ENCOUNTER — Encounter (HOSPITAL_COMMUNITY): Payer: Self-pay

## 2014-10-18 ENCOUNTER — Ambulatory Visit: Payer: BC Managed Care – PPO

## 2014-10-18 VITALS — BP 167/98 | HR 85 | Temp 97.9°F | Resp 18 | Ht 71.0 in | Wt 147.0 lb

## 2014-10-18 DIAGNOSIS — C7A00 Malignant carcinoid tumor of unspecified site: Secondary | ICD-10-CM

## 2014-10-18 DIAGNOSIS — C3431 Malignant neoplasm of lower lobe, right bronchus or lung: Secondary | ICD-10-CM

## 2014-10-18 DIAGNOSIS — C801 Malignant (primary) neoplasm, unspecified: Secondary | ICD-10-CM

## 2014-10-18 LAB — COMPREHENSIVE METABOLIC PANEL (CC13)
ALT: 17 U/L (ref 0–55)
ANION GAP: 10 meq/L (ref 3–11)
AST: 20 U/L (ref 5–34)
Albumin: 4.2 g/dL (ref 3.5–5.0)
Alkaline Phosphatase: 93 U/L (ref 40–150)
BUN: 12.5 mg/dL (ref 7.0–26.0)
CO2: 26 meq/L (ref 22–29)
CREATININE: 0.8 mg/dL (ref 0.7–1.3)
Calcium: 9.7 mg/dL (ref 8.4–10.4)
Chloride: 107 mEq/L (ref 98–109)
GLUCOSE: 134 mg/dL (ref 70–140)
Potassium: 3.9 mEq/L (ref 3.5–5.1)
Sodium: 143 mEq/L (ref 136–145)
Total Bilirubin: 0.92 mg/dL (ref 0.20–1.20)
Total Protein: 7 g/dL (ref 6.4–8.3)

## 2014-10-18 LAB — CBC WITH DIFFERENTIAL/PLATELET
BASO%: 0.4 % (ref 0.0–2.0)
BASOS ABS: 0 10*3/uL (ref 0.0–0.1)
EOS ABS: 0 10*3/uL (ref 0.0–0.5)
EOS%: 0.4 % (ref 0.0–7.0)
HEMATOCRIT: 39.5 % (ref 38.4–49.9)
HGB: 13.3 g/dL (ref 13.0–17.1)
LYMPH%: 17.5 % (ref 14.0–49.0)
MCH: 35.7 pg — ABNORMAL HIGH (ref 27.2–33.4)
MCHC: 33.7 g/dL (ref 32.0–36.0)
MCV: 106.1 fL — ABNORMAL HIGH (ref 79.3–98.0)
MONO#: 0.4 10*3/uL (ref 0.1–0.9)
MONO%: 7.9 % (ref 0.0–14.0)
NEUT%: 73.8 % (ref 39.0–75.0)
NEUTROS ABS: 4.1 10*3/uL (ref 1.5–6.5)
PLATELETS: 332 10*3/uL (ref 140–400)
RBC: 3.73 10*6/uL — ABNORMAL LOW (ref 4.20–5.82)
RDW: 18.9 % — ABNORMAL HIGH (ref 11.0–14.6)
WBC: 5.5 10*3/uL (ref 4.0–10.3)
lymph#: 1 10*3/uL (ref 0.9–3.3)

## 2014-10-18 MED ORDER — IOHEXOL 300 MG/ML  SOLN
100.0000 mL | Freq: Once | INTRAMUSCULAR | Status: AC | PRN
  Administered 2014-10-18: 100 mL via INTRAVENOUS

## 2014-10-18 MED ORDER — OXYCODONE HCL 5 MG PO CAPS: 5.0000 mg | ORAL_CAPSULE | ORAL | Status: DC | PRN

## 2014-10-18 NOTE — Patient Instructions (Signed)
Smoking Cessation, Tips for Success  If you are ready to quit smoking, congratulations! You have chosen to help yourself be healthier. Cigarettes bring nicotine, tar, carbon monoxide, and other irritants into your body. Your lungs, heart, and blood vessels will be able to work better without these poisons. There are many different ways to quit smoking. Nicotine gum, nicotine patches, a nicotine inhaler, or nicotine nasal spray can help with physical craving. Hypnosis, support groups, and medicines help break the habit of smoking.  WHAT THINGS CAN I DO TO MAKE QUITTING EASIER?   Here are some tips to help you quit for good:  · Pick a date when you will quit smoking completely. Tell all of your friends and family about your plan to quit on that date.  · Do not try to slowly cut down on the number of cigarettes you are smoking. Pick a quit date and quit smoking completely starting on that day.  · Throw away all cigarettes.    · Clean and remove all ashtrays from your home, work, and car.  · On a card, write down your reasons for quitting. Carry the card with you and read it when you get the urge to smoke.  · Cleanse your body of nicotine. Drink enough water and fluids to keep your urine clear or pale yellow. Do this after quitting to flush the nicotine from your body.  · Learn to predict your moods. Do not let a bad situation be your excuse to have a cigarette. Some situations in your life might tempt you into wanting a cigarette.  · Never have "just one" cigarette. It leads to wanting another and another. Remind yourself of your decision to quit.  · Change habits associated with smoking. If you smoked while driving or when feeling stressed, try other activities to replace smoking. Stand up when drinking your coffee. Brush your teeth after eating. Sit in a different chair when you read the paper. Avoid alcohol while trying to quit, and try to drink fewer caffeinated beverages. Alcohol and caffeine may urge you to  smoke.  · Avoid foods and drinks that can trigger a desire to smoke, such as sugary or spicy foods and alcohol.  · Ask people who smoke not to smoke around you.  · Have something planned to do right after eating or having a cup of coffee. For example, plan to take a walk or exercise.  · Try a relaxation exercise to calm you down and decrease your stress. Remember, you may be tense and nervous for the first 2 weeks after you quit, but this will pass.  · Find new activities to keep your hands busy. Play with a pen, coin, or rubber band. Doodle or draw things on paper.  · Brush your teeth right after eating. This will help cut down on the craving for the taste of tobacco after meals. You can also try mouthwash.    · Use oral substitutes in place of cigarettes. Try using lemon drops, carrots, cinnamon sticks, or chewing gum. Keep them handy so they are available when you have the urge to smoke.  · When you have the urge to smoke, try deep breathing.  · Designate your home as a nonsmoking area.  · If you are a heavy smoker, ask your health care provider about a prescription for nicotine chewing gum. It can ease your withdrawal from nicotine.  · Reward yourself. Set aside the cigarette money you save and buy yourself something nice.  · Look for   support from others. Join a support group or smoking cessation program. Ask someone at home or at work to help you with your plan to quit smoking.  · Always ask yourself, "Do I need this cigarette or is this just a reflex?" Tell yourself, "Today, I choose not to smoke," or "I do not want to smoke." You are reminding yourself of your decision to quit.  · Do not replace cigarette smoking with electronic cigarettes (commonly called e-cigarettes). The safety of e-cigarettes is unknown, and some may contain harmful chemicals.  · If you relapse, do not give up! Plan ahead and think about what you will do the next time you get the urge to smoke.  HOW WILL I FEEL WHEN I QUIT SMOKING?  You  may have symptoms of withdrawal because your body is used to nicotine (the addictive substance in cigarettes). You may crave cigarettes, be irritable, feel very hungry, cough often, get headaches, or have difficulty concentrating. The withdrawal symptoms are only temporary. They are strongest when you first quit but will go away within 10-14 days. When withdrawal symptoms occur, stay in control. Think about your reasons for quitting. Remind yourself that these are signs that your body is healing and getting used to being without cigarettes. Remember that withdrawal symptoms are easier to treat than the major diseases that smoking can cause.   Even after the withdrawal is over, expect periodic urges to smoke. However, these cravings are generally short lived and will go away whether you smoke or not. Do not smoke!  WHAT RESOURCES ARE AVAILABLE TO HELP ME QUIT SMOKING?  Your health care provider can direct you to community resources or hospitals for support, which may include:  · Group support.  · Education.  · Hypnosis.  · Therapy.  Document Released: 08/01/2004 Document Revised: 03/20/2014 Document Reviewed: 04/21/2013  [employer]® Patient Information ©2015 [employer]. This information is not intended to replace advice given to you by your health care provider. Make sure you discuss any questions you have with your health care provider.

## 2014-10-18 NOTE — Telephone Encounter (Signed)
Per staff message and POF I have scheduled appts. Advised scheduler of appts. JMW  

## 2014-10-18 NOTE — Telephone Encounter (Signed)
Gave avs & cal for Dec. °

## 2014-10-18 NOTE — Progress Notes (Signed)
[facility] [phone]   Fax:(336) [facility], [place] 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  Genomic Alterations Identified? FGFR2 A97T KRAS Q61H - subclonal? ARID1A [id number]* - subclonal?  PRIOR THERAPY: Status post right video-assisted thoracoscopy, drainage of pleural effusion, pleural biopsy, partial decortication, talc pleurodesis.under the care of Dr. Roxan Hockey on 06/23/2014.  CURRENT THERAPY: Systemic chemotherapy with carboplatin for AUC of 5 and Alimta 500 mg/M2 every 3 weeks. He is status post 3 cycles.  INTERVAL HISTORY: Henry [name] 63 y.o. male returns to the clinic today for follow-up visit. The patient tolerated the last cycle of her systemic chemotherapy fairly well with no significant adverse effects. He denied having any significant fever or chills, no nausea or vomiting. The patient denied having any significant chest pain, shortness breath, cough or hemoptysis. He has no significant weight loss or night sweats. He was supposed to have repeat CT scan of the chest, abdomen and pelvis last week but the patient requested to reschedule of his scan which will be done later today. He was supposed to start cycle #4 of his chemotherapy today. He has some black spots in his vision and he was evaluated by his ophthalmologist with no clear abnormalities identified.  MEDICAL HISTORY: Past Medical History  Diagnosis Date  . Hypertension   . GERD (gastroesophageal reflux disease)   . Hemochromatosis   . Hx of adenomatous colonic polyps   . Umbilical hernia     none since weight loss  . Diverticulosis   . Lumbar spondylosis     ankle  . Pleural effusion on right 12/16/13    per chest xray  . Shortness of breath     with   exertion  . Chronic pancreatitis   . IBS (irritable bowel syndrome)   . Lung cancer 06/2014  . 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  MEDICATIONS:  Current Outpatient Prescriptions  Medication Sig Dispense Refill  . albuterol (PROVENTIL HFA;VENTOLIN HFA) 108 (90 BASE) MCG/ACT inhaler Inhale 1-2 puffs into the lungs every 4 (four) hours as needed for wheezing or shortness of breath.     . dexamethasone (DECADRON) 4 MG tablet 4 mg by mouth twice a day the day before, day of and day after the chemotherapy every 3 weeks. 40 tablet 1  . folic acid (FOLVITE) 1 MG tablet Take 1 tablet (1 mg total) by mouth daily. 30 tablet 3  . ibuprofen (ADVIL,MOTRIN) 200 MG tablet Take 400 mg by mouth every 6 (six) hours as needed (Pain).    . megestrol (MEGACE ORAL) 40 MG/ML suspension Take 5 mLs (200 mg total) by mouth daily. 240 mL 1  . nystatin (MYCOSTATIN) 100000 UNIT/ML suspension Take 5 mLs (500,000 Units total) by mouth 4 (four) times daily. 60 mL 0  . oxycodone (OXY-IR) 5 MG capsule Take 1 capsule (5 mg total) by mouth every 4 (four) hours as needed. 30 capsule 0  . Pancrelipase, Lip-Prot-Amyl, 20880 UNITS TABS Take 2 tablets by mouth 3 (three) times daily with meals. 540 tablet 2  . potassium chloride SA (K-DUR,KLOR-CON) 20 MEQ tablet Take 40 mEq by mouth daily.     . Probiotic Product (ALIGN) 4 MG CAPS Take 4 mg by mouth daily.     . prochlorperazine (  COMPAZINE) 10 MG tablet Take 1 tablet (10 mg total) by mouth every 6 (six) hours as needed for nausea or vomiting. 30 tablet 0  . tamsulosin (FLOMAX) 0.4 MG CAPS capsule     . VOLTAREN 1 % GEL Apply 2 g topically 3 (three) times a week. Used on ankle    . diltiazem (CARDIZEM CD) 360 MG 24 hr capsule Take 360 mg by mouth every morning.     . hydrochlorothiazide (HYDRODIURIL) 25 MG tablet Take 25 mg by mouth every evening.     . nebivolol (BYSTOLIC) 5 MG tablet Take 5 mg by mouth every evening.      No current  facility-administered medications for this visit.    SURGICAL HISTORY:  Past Surgical History  Procedure Laterality Date  . Lumbar laminectomy  1983  . Knee arthrsoscopy  2003    left  . Tonsillectomy  1962  . Eus N/A 04/07/2013    Procedure: UPPER ENDOSCOPIC ULTRASOUND (EUS) LINEAR;  Surgeon: [name], MD;  Location: [facility];  Service: Endoscopy;  Laterality: N/A;  . Video assisted thoracoscopy (vats)/decortication Right 06/22/2014    Procedure: VIDEO ASSISTED THORACOSCOPY (VATS)/DECORTICATION;  Surgeon: [name], MD;  Location: [facility];  Service: Thoracic;  Laterality: Right;  . Pleural effusion drainage Right 06/22/2014    Procedure: DRAINAGE OF PLEURAL EFFUSION;  Surgeon: [name], MD;  Location: [facility];  Service: Thoracic;  Laterality: Right;  . Eus N/A 07/20/2014    Procedure: UPPER ENDOSCOPIC ULTRASOUND (EUS) LINEAR;  Surgeon: [name], MD;  Location: [facility];  Service: Endoscopy;  Laterality: N/A;    REVIEW OF SYSTEMS:  A comprehensive review of systems was negative.   PHYSICAL EXAMINATION: General appearance: alert, cooperative and no distress Head: Normocephalic, without obvious abnormality, atraumatic Neck: no adenopathy, no JVD, supple, symmetrical, trachea midline and thyroid not enlarged, symmetric, no tenderness/mass/nodules Lymph nodes: Cervical, supraclavicular, and axillary nodes normal. Resp: clear to auscultation bilaterally Back: symmetric, no curvature. ROM normal. No CVA tenderness. Cardio: regular rate and rhythm, S1, S2 normal, no murmur, click, rub or gallop GI: soft, non-tender; bowel sounds normal; no masses,  no organomegaly Extremities: extremities normal, atraumatic, no cyanosis or edema  ECOG PERFORMANCE STATUS: 1 - Symptomatic but completely ambulatory  Blood pressure 167/98, pulse 85, temperature 97.9 F (36.6 C), temperature source Oral, resp. rate 18, height _0  (1.803 m), weight 147 lb (66.679  kg).  LABORATORY DATA: Lab Results  Component Value Date   WBC 5.5 10/18/2014   HGB 13.3 10/18/2014   HCT 39.5 10/18/2014   MCV 106.1* 10/18/2014   PLT 332 10/18/2014      Chemistry      Component Value Date/Time   NA 143 10/18/2014 1032   NA 140 06/24/2014 0346   K 3.9 10/18/2014 1032   K 3.3* 06/24/2014 0346   CL 102 06/24/2014 0346   CO2 26 10/18/2014 1032   CO2 28 06/24/2014 0346   BUN 12.5 10/18/2014 1032   BUN 11 06/24/2014 0346   CREATININE 0.8 10/18/2014 1032   CREATININE 0.75 06/24/2014 0346      Component Value Date/Time   CALCIUM 9.7 10/18/2014 1032   CALCIUM 8.6 06/24/2014 0346   ALKPHOS 93 10/18/2014 1032   ALKPHOS 54 06/24/2014 0346   AST 20 10/18/2014 1032   AST 13 06/24/2014 0346   ALT 17 10/18/2014 1032   ALT 11 06/24/2014 0346   BILITOT 0.92 10/18/2014 1032   BILITOT 0.6 06/24/2014 0346  RADIOGRAPHIC STUDIES: No results found.  ASSESSMENT AND PLAN: This is a very pleasant 63 years old white male with metastatic adenocarcinoma of unknown primary questionable for upper gastrointestinal, pancreatic or biliary or primary lung cancer. He is currently on systemic chemotherapy with carboplatin and Alimta status post 3 cycles and tolerating his treatment fairly well. Unfortunately the restaging scan is scheduled for later this afternoon. I recommended for the patient to hold on starting cycle #4 until we have the scan results. If there is no evidence for disease progression and the patient will resume his systemic chemotherapy in the next 1-2 days If no evidence for disease progression, The patient would come back for follow-up visit in 3 weeks for reevaluation before starting cycle #5, otherwise I will see the patient sooner to discuss other treatment options. He was advised to call immediately if he has any concerning symptoms in the interval. The patient voices understanding of current disease status and treatment options and is in agreement with the  current care plan.  All questions were answered. The patient knows to call the clinic with any problems, questions or concerns. We can certainly see the patient much sooner if necessary.  Disclaimer: This note was dictated with voice recognition software. Similar sounding words can inadvertently be transcribed and may not be corrected upon review.

## 2014-10-19 ENCOUNTER — Ambulatory Visit (HOSPITAL_BASED_OUTPATIENT_CLINIC_OR_DEPARTMENT_OTHER): Payer: BC Managed Care – PPO

## 2014-10-19 DIAGNOSIS — Z5111 Encounter for antineoplastic chemotherapy: Secondary | ICD-10-CM

## 2014-10-19 DIAGNOSIS — C801 Malignant (primary) neoplasm, unspecified: Secondary | ICD-10-CM

## 2014-10-19 DIAGNOSIS — C3431 Malignant neoplasm of lower lobe, right bronchus or lung: Secondary | ICD-10-CM

## 2014-10-19 MED ORDER — SODIUM CHLORIDE 0.9 % IV SOLN
Freq: Once | INTRAVENOUS | Status: AC
  Administered 2014-10-19: 12:00:00 via INTRAVENOUS

## 2014-10-19 MED ORDER — DEXAMETHASONE SODIUM PHOSPHATE 20 MG/5ML IJ SOLN
INTRAMUSCULAR | Status: AC
  Filled 2014-10-19: qty 5

## 2014-10-19 MED ORDER — DEXAMETHASONE SODIUM PHOSPHATE 20 MG/5ML IJ SOLN
20.0000 mg | Freq: Once | INTRAMUSCULAR | Status: AC
  Administered 2014-10-19: 20 mg via INTRAVENOUS

## 2014-10-19 MED ORDER — ONDANSETRON 16 MG/50ML IVPB (CHCC)
16.0000 mg | Freq: Once | INTRAVENOUS | Status: AC
  Administered 2014-10-19: 16 mg via INTRAVENOUS

## 2014-10-19 MED ORDER — SODIUM CHLORIDE 0.9 % IV SOLN
490.0000 mg/m2 | Freq: Once | INTRAVENOUS | Status: AC
  Administered 2014-10-19: 900 mg via INTRAVENOUS
  Filled 2014-10-19: qty 36

## 2014-10-19 MED ORDER — SODIUM CHLORIDE 0.9 % IV SOLN
565.5000 mg | Freq: Once | INTRAVENOUS | Status: AC
  Administered 2014-10-19: 570 mg via INTRAVENOUS
  Filled 2014-10-19: qty 57

## 2014-10-19 MED ORDER — ONDANSETRON 16 MG/50ML IVPB (CHCC)
INTRAVENOUS | Status: AC
Start: 2014-10-19 — End: 2014-10-19
  Filled 2014-10-19: qty 16

## 2014-10-19 NOTE — Patient Instructions (Signed)
[facility] Discharge Instructions for Patients Receiving Chemotherapy  Today you received the following chemotherapy agents: Alimta and Carboplatin  To help prevent nausea and vomiting after your treatment, we encourage you to take your nausea medication: As directed.   If you develop nausea and vomiting that is not controlled by your nausea medication, call the clinic.   BELOW ARE SYMPTOMS THAT SHOULD BE REPORTED IMMEDIATELY:  *FEVER GREATER THAN 100.5 F  *CHILLS WITH OR WITHOUT FEVER  NAUSEA AND VOMITING THAT IS NOT CONTROLLED WITH YOUR NAUSEA MEDICATION  *UNUSUAL SHORTNESS OF BREATH  *UNUSUAL BRUISING OR BLEEDING  TENDERNESS IN MOUTH AND THROAT WITH OR WITHOUT PRESENCE OF ULCERS  *URINARY PROBLEMS  *BOWEL PROBLEMS  UNUSUAL RASH Items with * indicate a potential emergency and should be followed up as soon as possible.  Feel free to call the clinic you have any questions or concerns. The clinic phone number is [phone].

## 2014-10-25 ENCOUNTER — Other Ambulatory Visit (HOSPITAL_BASED_OUTPATIENT_CLINIC_OR_DEPARTMENT_OTHER): Payer: BC Managed Care – PPO

## 2014-10-25 DIAGNOSIS — C801 Malignant (primary) neoplasm, unspecified: Secondary | ICD-10-CM

## 2014-10-25 DIAGNOSIS — C3431 Malignant neoplasm of lower lobe, right bronchus or lung: Secondary | ICD-10-CM

## 2014-10-25 LAB — CBC WITH DIFFERENTIAL/PLATELET
BASO%: 0.7 % (ref 0.0–2.0)
BASOS ABS: 0 10*3/uL (ref 0.0–0.1)
EOS%: 1.5 % (ref 0.0–7.0)
Eosinophils Absolute: 0.1 10*3/uL (ref 0.0–0.5)
HCT: 35.9 % — ABNORMAL LOW (ref 38.4–49.9)
HEMOGLOBIN: 12.7 g/dL — AB (ref 13.0–17.1)
LYMPH#: 1.1 10*3/uL (ref 0.9–3.3)
LYMPH%: 27.3 % (ref 14.0–49.0)
MCH: 36 pg — AB (ref 27.2–33.4)
MCHC: 35.4 g/dL (ref 32.0–36.0)
MCV: 101.7 fL — ABNORMAL HIGH (ref 79.3–98.0)
MONO#: 0.1 10*3/uL (ref 0.1–0.9)
MONO%: 1.2 % (ref 0.0–14.0)
NEUT#: 2.8 10*3/uL (ref 1.5–6.5)
NEUT%: 69.3 % (ref 39.0–75.0)
Platelets: 210 10*3/uL (ref 140–400)
RBC: 3.53 10*6/uL — ABNORMAL LOW (ref 4.20–5.82)
RDW: 15.9 % — AB (ref 11.0–14.6)
WBC: 4.1 10*3/uL (ref 4.0–10.3)

## 2014-10-25 LAB — COMPREHENSIVE METABOLIC PANEL (CC13)
ALBUMIN: 3.9 g/dL (ref 3.5–5.0)
ALT: 20 U/L (ref 0–55)
ANION GAP: 11 meq/L (ref 3–11)
AST: 25 U/L (ref 5–34)
Alkaline Phosphatase: 77 U/L (ref 40–150)
BUN: 15.3 mg/dL (ref 7.0–26.0)
CALCIUM: 9.8 mg/dL (ref 8.4–10.4)
CHLORIDE: 98 meq/L (ref 98–109)
CO2: 31 mEq/L — ABNORMAL HIGH (ref 22–29)
CREATININE: 0.8 mg/dL (ref 0.7–1.3)
EGFR: >90 mL/min/{1.73_m2} (ref >90)
Glucose: 123 mg/dl (ref 70–140)
POTASSIUM: 3.3 meq/L — AB (ref 3.5–5.1)
Sodium: 140 mEq/L (ref 136–145)
Total Bilirubin: 1.52 mg/dL — ABNORMAL HIGH (ref 0.20–1.20)
Total Protein: 6.7 g/dL (ref 6.4–8.3)

## 2014-11-01 ENCOUNTER — Other Ambulatory Visit (HOSPITAL_BASED_OUTPATIENT_CLINIC_OR_DEPARTMENT_OTHER): Payer: BC Managed Care – PPO

## 2014-11-01 ENCOUNTER — Ambulatory Visit: Payer: BC Managed Care – PPO

## 2014-11-01 DIAGNOSIS — C3431 Malignant neoplasm of lower lobe, right bronchus or lung: Secondary | ICD-10-CM

## 2014-11-01 DIAGNOSIS — C801 Malignant (primary) neoplasm, unspecified: Secondary | ICD-10-CM

## 2014-11-01 LAB — COMPREHENSIVE METABOLIC PANEL (CC13)
ALK PHOS: 70 U/L (ref 40–150)
ALT: 19 U/L (ref 0–55)
AST: 22 U/L (ref 5–34)
Albumin: 3.6 g/dL (ref 3.5–5.0)
Anion Gap: 10 mEq/L (ref 3–11)
BILIRUBIN TOTAL: 0.69 mg/dL (ref 0.20–1.20)
BUN: 15.6 mg/dL (ref 7.0–26.0)
CO2: 27 mEq/L (ref 22–29)
Calcium: 9 mg/dL (ref 8.4–10.4)
Chloride: 107 mEq/L (ref 98–109)
Creatinine: 0.7 mg/dL (ref 0.7–1.3)
EGFR: >90 mL/min/{1.73_m2} (ref >90)
Glucose: 105 mg/dl (ref 70–140)
POTASSIUM: 3.4 meq/L — AB (ref 3.5–5.1)
SODIUM: 144 meq/L (ref 136–145)
TOTAL PROTEIN: 6.5 g/dL (ref 6.4–8.3)

## 2014-11-01 LAB — CBC WITH DIFFERENTIAL/PLATELET
BASO%: 0.1 % (ref 0.0–2.0)
Basophils Absolute: 0 10*3/uL (ref 0.0–0.1)
EOS%: 1.5 % (ref 0.0–7.0)
Eosinophils Absolute: 0.1 10*3/uL (ref 0.0–0.5)
HCT: 31.6 % — ABNORMAL LOW (ref 38.4–49.9)
HGB: 10.9 g/dL — ABNORMAL LOW (ref 13.0–17.1)
LYMPH%: 37.1 % (ref 14.0–49.0)
MCH: 35.7 pg — ABNORMAL HIGH (ref 27.2–33.4)
MCHC: 34.4 g/dL (ref 32.0–36.0)
MCV: 103.8 fL — ABNORMAL HIGH (ref 79.3–98.0)
MONO#: 0.6 10*3/uL (ref 0.1–0.9)
MONO%: 17.7 % — AB (ref 0.0–14.0)
NEUT#: 1.6 10*3/uL (ref 1.5–6.5)
NEUT%: 43.6 % (ref 39.0–75.0)
PLATELETS: 91 10*3/uL — AB (ref 140–400)
RBC: 3.04 10*6/uL — AB (ref 4.20–5.82)
RDW: 15.8 % — AB (ref 11.0–14.6)
WBC: 3.5 10*3/uL — AB (ref 4.0–10.3)
lymph#: 1.3 10*3/uL (ref 0.9–3.3)

## 2014-11-07 ENCOUNTER — Telehealth: Payer: Self-pay | Admitting: *Deleted

## 2014-11-07 NOTE — Telephone Encounter (Signed)
Pt called stating that he has a cold and does not know if he would get treatment tomorrow.  Called back, no answer, left msg stating that he should come in regardless to have labs done and an assessment so if he does have something besides a cold we can give him a rx if needed.

## 2014-11-08 ENCOUNTER — Ambulatory Visit (HOSPITAL_BASED_OUTPATIENT_CLINIC_OR_DEPARTMENT_OTHER): Payer: BC Managed Care – PPO | Admitting: Physician Assistant

## 2014-11-08 ENCOUNTER — Telehealth: Payer: Self-pay | Admitting: Internal Medicine

## 2014-11-08 ENCOUNTER — Ambulatory Visit (HOSPITAL_BASED_OUTPATIENT_CLINIC_OR_DEPARTMENT_OTHER): Payer: BC Managed Care – PPO

## 2014-11-08 ENCOUNTER — Ambulatory Visit (HOSPITAL_BASED_OUTPATIENT_CLINIC_OR_DEPARTMENT_OTHER): Payer: BC Managed Care – PPO | Admitting: Lab

## 2014-11-08 ENCOUNTER — Telehealth: Payer: Self-pay | Admitting: *Deleted

## 2014-11-08 ENCOUNTER — Encounter: Payer: Self-pay | Admitting: Physician Assistant

## 2014-11-08 ENCOUNTER — Other Ambulatory Visit: Payer: BC Managed Care – PPO

## 2014-11-08 VITALS — BP 147/89 | HR 86 | Temp 98.2°F | Resp 18 | Ht 71.0 in | Wt 149.0 lb

## 2014-11-08 DIAGNOSIS — C3431 Malignant neoplasm of lower lobe, right bronchus or lung: Secondary | ICD-10-CM

## 2014-11-08 DIAGNOSIS — C801 Malignant (primary) neoplasm, unspecified: Secondary | ICD-10-CM

## 2014-11-08 DIAGNOSIS — Z5111 Encounter for antineoplastic chemotherapy: Secondary | ICD-10-CM

## 2014-11-08 DIAGNOSIS — C3432 Malignant neoplasm of lower lobe, left bronchus or lung: Secondary | ICD-10-CM

## 2014-11-08 LAB — CBC WITH DIFFERENTIAL/PLATELET
BASO%: 0.2 % (ref 0.0–2.0)
Basophils Absolute: 0 10*3/uL (ref 0.0–0.1)
EOS%: 3.6 % (ref 0.0–7.0)
Eosinophils Absolute: 0.2 10*3/uL (ref 0.0–0.5)
HEMATOCRIT: 32.4 % — AB (ref 38.4–49.9)
HEMOGLOBIN: 11.5 g/dL — AB (ref 13.0–17.1)
LYMPH%: 31.2 % (ref 14.0–49.0)
MCH: 36.2 pg — AB (ref 27.2–33.4)
MCHC: 35.5 g/dL (ref 32.0–36.0)
MCV: 101.9 fL — AB (ref 79.3–98.0)
MONO#: 0.8 10*3/uL (ref 0.1–0.9)
MONO%: 16.6 % — AB (ref 0.0–14.0)
NEUT#: 2.4 10*3/uL (ref 1.5–6.5)
NEUT%: 48.4 % (ref 39.0–75.0)
PLATELETS: 241 10*3/uL (ref 140–400)
RBC: 3.18 10*6/uL — ABNORMAL LOW (ref 4.20–5.82)
RDW: 16.6 % — ABNORMAL HIGH (ref 11.0–14.6)
WBC: 4.9 10*3/uL (ref 4.0–10.3)
lymph#: 1.5 10*3/uL (ref 0.9–3.3)

## 2014-11-08 LAB — COMPREHENSIVE METABOLIC PANEL (CC13)
ALT: 15 U/L (ref 0–55)
AST: 21 U/L (ref 5–34)
Albumin: 3.8 g/dL (ref 3.5–5.0)
Alkaline Phosphatase: 84 U/L (ref 40–150)
Anion Gap: 11 meq/L (ref 3–11)
BUN: 15 mg/dL (ref 7.0–26.0)
CO2: 30 meq/L — ABNORMAL HIGH (ref 22–29)
Calcium: 9.3 mg/dL (ref 8.4–10.4)
Chloride: 100 meq/L (ref 98–109)
Creatinine: 0.9 mg/dL (ref 0.7–1.3)
EGFR: >90 ml/min/1.73 m2
Glucose: 150 mg/dL — ABNORMAL HIGH (ref 70–140)
Potassium: 3.2 meq/L — ABNORMAL LOW (ref 3.5–5.1)
Sodium: 140 meq/L (ref 136–145)
Total Bilirubin: 0.8 mg/dL (ref 0.20–1.20)
Total Protein: 6.7 g/dL (ref 6.4–8.3)

## 2014-11-08 MED ORDER — SODIUM CHLORIDE 0.9 % IV SOLN
516.5000 mg | Freq: Once | INTRAVENOUS | Status: AC
  Administered 2014-11-08: 520 mg via INTRAVENOUS
  Filled 2014-11-08: qty 52

## 2014-11-08 MED ORDER — DEXAMETHASONE SODIUM PHOSPHATE 20 MG/5ML IJ SOLN
20.0000 mg | Freq: Once | INTRAMUSCULAR | Status: AC
  Administered 2014-11-08: 20 mg via INTRAVENOUS

## 2014-11-08 MED ORDER — SODIUM CHLORIDE 0.9 % IV SOLN
Freq: Once | INTRAVENOUS | Status: AC
Start: 2014-11-08 — End: 2014-11-08
  Administered 2014-11-08: 11:00:00 via INTRAVENOUS

## 2014-11-08 MED ORDER — SODIUM CHLORIDE 0.9 % IV SOLN
490.0000 mg/m2 | Freq: Once | INTRAVENOUS | Status: AC
  Administered 2014-11-08: 900 mg via INTRAVENOUS
  Filled 2014-11-08: qty 36

## 2014-11-08 MED ORDER — ONDANSETRON 16 MG/50ML IVPB (CHCC)
16.0000 mg | Freq: Once | INTRAVENOUS | Status: AC
  Administered 2014-11-08: 16 mg via INTRAVENOUS

## 2014-11-08 MED ORDER — DEXAMETHASONE SODIUM PHOSPHATE 20 MG/5ML IJ SOLN
INTRAMUSCULAR | Status: AC
  Filled 2014-11-08: qty 5

## 2014-11-08 MED ORDER — ONDANSETRON 16 MG/50ML IVPB (CHCC)
INTRAVENOUS | Status: AC
  Filled 2014-11-08: qty 16

## 2014-11-08 NOTE — Patient Instructions (Signed)
[facility] Discharge Instructions for Patients Receiving Chemotherapy  Today you received the following chemotherapy agents Alimta/carboplatin.  To help prevent nausea and vomiting after your treatment, we encourage you to take your nausea medication as directed.   If you develop nausea and vomiting that is not controlled by your nausea medication, call the clinic.   BELOW ARE SYMPTOMS THAT SHOULD BE REPORTED IMMEDIATELY:  *FEVER GREATER THAN 100.5 F  *CHILLS WITH OR WITHOUT FEVER  NAUSEA AND VOMITING THAT IS NOT CONTROLLED WITH YOUR NAUSEA MEDICATION  *UNUSUAL SHORTNESS OF BREATH  *UNUSUAL BRUISING OR BLEEDING  TENDERNESS IN MOUTH AND THROAT WITH OR WITHOUT PRESENCE OF ULCERS  *URINARY PROBLEMS  *BOWEL PROBLEMS  UNUSUAL RASH Items with * indicate a potential emergency and should be followed up as soon as possible.  Feel free to call the clinic you have any questions or concerns. The clinic phone number is [phone].

## 2014-11-08 NOTE — Progress Notes (Addendum)
[name] Telephone:([phone]) [phone]   Fax:(336) [place], [place] [address] [place] [address]  DIAGNOSIS: Metastatic adenocarcinoma of unknown primary but questionable for upper gastrointestinal, pancreaticobiliary or primary lung cancer, diagnosed in August of 2015.  Genomic Alterations Identified? FGFR2 A97T KRAS Q61H - subclonal? ARID1A [id number]* - subclonal?  PRIOR THERAPY: Status post right video-assisted thoracoscopy, drainage of pleural effusion, pleural biopsy, partial decortication, talc pleurodesis.under the care of Dr. Roxan Hockey on 06/23/2014.  CURRENT THERAPY: Systemic chemotherapy with carboplatin for AUC of 5 and Alimta 500 mg/M2 every 3 weeks. He is status post 4 cycles.  INTERVAL HISTORY: Henry [name] 63 y.o. male returns to the clinic today for follow-up visit. The patient tolerated the last cycle of her systemic chemotherapy fairly well with no significant adverse effects. He denied having any significant fever or chills, no nausea or vomiting. He does note some shortness of breath but also states that it is at his baseline. He reports that he is getting over a recent upper respiratory illness and has had some congestion. He denied fever or chills associated with this illness. The patient denied having any significant chest pain, cough or hemoptysis. He has no significant weight loss or night sweats. He reports improvement in his long-standing diarrhea. He had changed his diet with this slight improvement but really knows the difference once he stopped his blood pressure medications. He is to follow-up with his primary care physician to see which blood pressure medications he can tolerate that will not irritate his gastrointestinal tract.    MEDICAL HISTORY: Past Medical History  Diagnosis Date  . Hypertension   . GERD (gastroesophageal reflux disease)   . Hemochromatosis   . Hx of  adenomatous colonic polyps   . Umbilical hernia     none since weight loss  . Diverticulosis   . Lumbar spondylosis     ankle  . Pleural effusion on right 12/16/13    per chest xray  . Shortness of breath     with  exertion  . Chronic pancreatitis   . IBS (irritable bowel syndrome)   . Lung cancer 06/2014  . 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  MEDICATIONS:  Current Outpatient Prescriptions  Medication Sig Dispense Refill  . folic acid (FOLVITE) 1 MG tablet Take 1 tablet (1 mg total) by mouth daily. 30 tablet 3  . hydrochlorothiazide (HYDRODIURIL) 25 MG tablet Take 25 mg by mouth every evening.     Marland Kitchen ibuprofen (ADVIL,MOTRIN) 200 MG tablet Take 400 mg by mouth every 6 (six) hours as needed (Pain).    . nebivolol (BYSTOLIC) 5 MG tablet Take 5 mg by mouth every evening.     Marland Kitchen oxycodone (OXY-IR) 5 MG capsule Take 1 capsule (5 mg total) by mouth every 4 (four) hours as needed. 30 capsule 0  . potassium chloride SA (K-DUR,KLOR-CON) 20 MEQ tablet Take 40 mEq by mouth daily.     Marland Kitchen albuterol (PROVENTIL HFA;VENTOLIN HFA) 108 (90 BASE) MCG/ACT inhaler Inhale 1-2 puffs into the lungs every 4 (four) hours as needed for wheezing or shortness of breath.     . dexamethasone (DECADRON) 4 MG tablet 4 mg by mouth twice a day the day before, day of and day after the chemotherapy every 3 weeks. (Patient not taking: Reported on 11/08/2014) 40 tablet 1  . diltiazem (CARDIZEM CD) 360 MG 24 hr capsule  Take 360 mg by mouth every morning.     . megestrol (MEGACE ORAL) 40 MG/ML suspension Take 5 mLs (200 mg total) by mouth daily. (Patient not taking: Reported on 11/08/2014) 240 mL 1  . nystatin (MYCOSTATIN) 100000 UNIT/ML suspension Take 5 mLs (500,000 Units total) by mouth 4 (four) times daily. (Patient not taking: Reported on 11/08/2014) 60 mL 0  . Pancrelipase, Lip-Prot-Amyl, 20880 UNITS TABS Take 2 tablets by mouth 3 (three) times daily with meals. (Patient not taking:  Reported on 11/08/2014) 540 tablet 2  . Probiotic Product (ALIGN) 4 MG CAPS Take 4 mg by mouth daily.     . prochlorperazine (COMPAZINE) 10 MG tablet Take 1 tablet (10 mg total) by mouth every 6 (six) hours as needed for nausea or vomiting. (Patient not taking: Reported on 11/08/2014) 30 tablet 0  . tamsulosin (FLOMAX) 0.4 MG CAPS capsule     . VOLTAREN 1 % GEL Apply 2 g topically 3 (three) times a week. Used on ankle     No current facility-administered medications for this visit.    SURGICAL HISTORY:  Past Surgical History  Procedure Laterality Date  . Lumbar laminectomy  1983  . Knee arthrsoscopy  2003    left  . Tonsillectomy  1962  . Eus N/A 04/07/2013    Procedure: UPPER ENDOSCOPIC ULTRASOUND (EUS) LINEAR;  Surgeon: [name], MD;  Location: [facility];  Service: Endoscopy;  Laterality: N/A;  . Video assisted thoracoscopy (vats)/decortication Right 06/22/2014    Procedure: VIDEO ASSISTED THORACOSCOPY (VATS)/DECORTICATION;  Surgeon: [name], MD;  Location: [facility];  Service: Thoracic;  Laterality: Right;  . Pleural effusion drainage Right 06/22/2014    Procedure: DRAINAGE OF PLEURAL EFFUSION;  Surgeon: [name], MD;  Location: [facility];  Service: Thoracic;  Laterality: Right;  . Eus N/A 07/20/2014    Procedure: UPPER ENDOSCOPIC ULTRASOUND (EUS) LINEAR;  Surgeon: [name], MD;  Location: [facility];  Service: Endoscopy;  Laterality: N/A;    REVIEW OF SYSTEMS:  A comprehensive review of systems was negative except for: Respiratory: positive for cough and dyspnea on exertion Gastrointestinal: positive for diarrhea and resolved after patient discontinued his blood pressure medication   PHYSICAL EXAMINATION: General appearance: alert, cooperative and no distress Head: Normocephalic, without obvious abnormality, atraumatic Neck: no adenopathy, no JVD, supple, symmetrical, trachea midline and thyroid not enlarged, symmetric, no tenderness/mass/nodules Lymph  nodes: Cervical, supraclavicular, and axillary nodes normal. Resp: rhonchi bilaterally and greater at the left base Back: symmetric, no curvature. ROM normal. No CVA tenderness. Cardio: regular rate and rhythm, S1, S2 normal, no murmur, click, rub or gallop GI: soft, non-tender; bowel sounds normal; no masses,  no organomegaly Extremities: extremities normal, atraumatic, no cyanosis or edema  ECOG PERFORMANCE STATUS: 1 - Symptomatic but completely ambulatory  Blood pressure 147/89, pulse 86, temperature 98.2 F (36.8 C), temperature source Oral, resp. rate 18, height 5' 11" (1.803 m), weight 149 lb (67.586 kg), SpO2 99 %.  LABORATORY DATA: Lab Results  Component Value Date   WBC 4.9 11/08/2014   HGB 11.5* 11/08/2014   HCT 32.4* 11/08/2014   MCV 101.9* 11/08/2014   PLT 241 11/08/2014      Chemistry      Component Value Date/Time   NA 140 11/08/2014 1001   NA 140 06/24/2014 0346   K 3.2* 11/08/2014 1001   K 3.3* 06/24/2014 0346   CL 102 06/24/2014 0346   CO2 30* 11/08/2014 1001   CO2 28 06/24/2014 0346  BUN 15.0 11/08/2014 1001   BUN 11 06/24/2014 0346   CREATININE 0.9 11/08/2014 1001   CREATININE 0.75 06/24/2014 0346      Component Value Date/Time   CALCIUM 9.3 11/08/2014 1001   CALCIUM 8.6 06/24/2014 0346   ALKPHOS 84 11/08/2014 1001   ALKPHOS 54 06/24/2014 0346   AST 21 11/08/2014 1001   AST 13 06/24/2014 0346   ALT 15 11/08/2014 1001   ALT 11 06/24/2014 0346   BILITOT 0.80 11/08/2014 1001   BILITOT 0.6 06/24/2014 0346       RADIOGRAPHIC STUDIES: Ct Chest W Contrast  10/18/2014   CLINICAL DATA:  Lung cancer, chemotherapy in progress.  EXAM: CT CHEST, ABDOMEN, AND PELVIS WITH CONTRAST  TECHNIQUE: Multidetector CT imaging of the chest, abdomen and pelvis was performed following the standard protocol during bolus administration of intravenous contrast.  CONTRAST:  180m OMNIPAQUE IOHEXOL 300 MG/ML  SOLN  COMPARISON:  PET 06/30/2014, CT chest 06/21/2014, MR abdomen  02/11/2013 and CT abdomen pelvis 02/04/2013.  FINDINGS: CT CHEST FINDINGS  Mediastinal lymph nodes are not enlarged by CT size criteria. No hilar or axillary adenopathy. Heart size within normal limits. Coronary artery calcification. No pericardial effusion.  Loculated right hydro pneumothorax is again seen with the amounts of air and fluid decreased from 06/21/2014, after talc pleurodesis. There are subpleural areas of nodular consolidation and architectural distortion in the right hemi thorax, basilar predominant. Index lesion in the inferior right middle lobe measures approximately 1.2 x 3.8 cm (image 44), previously 1.7 x 3.8 cm. Comparison with the prior exam is somewhat challenging due to the size of the pleural effusion and collapse/ consolidation in the right lower lobe on the prior study. Left upper lobe nodule measures 1.0 x 1.2 cm (image 15), new. Additional scattered nodularity is seen in the left lung, similar to the prior exam. A peripheral left lower lobe nodule may be slightly more solid in appearance, measuring 7 x 10 mm (image 38). Airway is unremarkable.  CT ABDOMEN AND PELVIS FINDINGS  Hepatobiliary: Low-attenuation lesions in the liver measure up to 12 mm in the right hepatic lobe, stable. Liver measures approximately 22 cm. Liver and gallbladder are otherwise unremarkable. No biliary ductal dilatation.  Pancreas: Negative.  Spleen: 5 mm low-attenuation lesion in the inferior spleen is too small to characterize. Spleen is otherwise unremarkable.  Adrenals/Urinary Tract: Adrenal glands and kidneys are unremarkable. Bladder is decompressed.  Stomach/Bowel: Stomach, small bowel and appendix are unremarkable. Stool is seen throughout the colon, indicative of constipation.  Vascular/Lymphatic: Prominent vascular collaterals in the upper abdomen. Atherosclerotic calcification of the arterial vasculature without abdominal aortic aneurysm. No pathologically enlarged lymph nodes.  Reproductive: Prostate  is at the upper limits of normal in size.  Other: No definite free fluid. Mesenteries and peritoneum are grossly unremarkable.  Musculoskeletal: No worrisome lytic or sclerotic lesions. Degenerative changes are seen in the spine.  IMPRESSION: 1. Loculated right hydro pneumothorax, decreased in size from 06/21/2014, status post talc pleurodesis. 2. Scattered pulmonary parenchymal nodularity and masses, as described above, grossly stable. Left upper lobe nodule is new. Left lower lobe nodule appears slightly more solid in appearance. Continued attention on followup exams is warranted. 3. Coronary artery calcification. 4. Hepatomegaly.   Electronically Signed   By: MLorin PicketM.D.   On: 10/18/2014 15:34   Ct Abdomen Pelvis W Contrast  10/18/2014   CLINICAL DATA:  Lung cancer, chemotherapy in progress.  EXAM: CT CHEST, ABDOMEN, AND PELVIS WITH CONTRAST  TECHNIQUE: Multidetector  CT imaging of the chest, abdomen and pelvis was performed following the standard protocol during bolus administration of intravenous contrast.  CONTRAST:  152m OMNIPAQUE IOHEXOL 300 MG/ML  SOLN  COMPARISON:  PET 06/30/2014, CT chest 06/21/2014, MR abdomen 02/11/2013 and CT abdomen pelvis 02/04/2013.  FINDINGS: CT CHEST FINDINGS  Mediastinal lymph nodes are not enlarged by CT size criteria. No hilar or axillary adenopathy. Heart size within normal limits. Coronary artery calcification. No pericardial effusion.  Loculated right hydro pneumothorax is again seen with the amounts of air and fluid decreased from 06/21/2014, after talc pleurodesis. There are subpleural areas of nodular consolidation and architectural distortion in the right hemi thorax, basilar predominant. Index lesion in the inferior right middle lobe measures approximately 1.2 x 3.8 cm (image 44), previously 1.7 x 3.8 cm. Comparison with the prior exam is somewhat challenging due to the size of the pleural effusion and collapse/ consolidation in the right lower lobe on the  prior study. Left upper lobe nodule measures 1.0 x 1.2 cm (image 15), new. Additional scattered nodularity is seen in the left lung, similar to the prior exam. A peripheral left lower lobe nodule may be slightly more solid in appearance, measuring 7 x 10 mm (image 38). Airway is unremarkable.  CT ABDOMEN AND PELVIS FINDINGS  Hepatobiliary: Low-attenuation lesions in the liver measure up to 12 mm in the right hepatic lobe, stable. Liver measures approximately 22 cm. Liver and gallbladder are otherwise unremarkable. No biliary ductal dilatation.  Pancreas: Negative.  Spleen: 5 mm low-attenuation lesion in the inferior spleen is too small to characterize. Spleen is otherwise unremarkable.  Adrenals/Urinary Tract: Adrenal glands and kidneys are unremarkable. Bladder is decompressed.  Stomach/Bowel: Stomach, small bowel and appendix are unremarkable. Stool is seen throughout the colon, indicative of constipation.  Vascular/Lymphatic: Prominent vascular collaterals in the upper abdomen. Atherosclerotic calcification of the arterial vasculature without abdominal aortic aneurysm. No pathologically enlarged lymph nodes.  Reproductive: Prostate is at the upper limits of normal in size.  Other: No definite free fluid. Mesenteries and peritoneum are grossly unremarkable.  Musculoskeletal: No worrisome lytic or sclerotic lesions. Degenerative changes are seen in the spine.  IMPRESSION: 1. Loculated right hydro pneumothorax, decreased in size from 06/21/2014, status post talc pleurodesis. 2. Scattered pulmonary parenchymal nodularity and masses, as described above, grossly stable. Left upper lobe nodule is new. Left lower lobe nodule appears slightly more solid in appearance. Continued attention on followup exams is warranted. 3. Coronary artery calcification. 4. Hepatomegaly.   Electronically Signed   By: MLorin PicketM.D.   On: 10/18/2014 15:34    ASSESSMENT AND PLAN: This is a very pleasant 63years old white male with  metastatic adenocarcinoma of unknown primary questionable for upper gastrointestinal, pancreatic or biliary or primary lung cancer. He is currently on systemic chemotherapy with carboplatin and Alimta status post 4 cycles and tolerating his treatment fairly well. Patient was discussed with and also seen by Dr. MJulien Nordmann Dr. MJulien Nordmannreviewed the patient's recent CT scan results with Mr. Ludwig. He will continue on his current chemotherapy with carboplatin and Alimta. He'll proceed with cycle #5 today as scheduled. He will continue with weekly labs as scheduled. He will follow-up in 3 weeks prior to the start of cycle #6.   He was advised to call immediately if he has any concerning symptoms in the interval. The patient voices understanding of current disease status and treatment options and is in agreement with the current care plan.  All questions were answered.  The patient knows to call the clinic with any problems, questions or concerns. We can certainly see the patient much sooner if necessary.  Carlton Adam, PA-C 11/08/2014  ADDENDUM: Hematology/Oncology Attending: I had a face to face encounter with the patient. I recommended his care plan. This is a very pleasant 63 years old white male with metastatic non-small cell lung cancer, adenocarcinoma currently undergoing systemic chemotherapy with carboplatin and Alimta status post 4 cycles. He is tolerating his treatment well with no significant adverse effects. He has some issues with the insurance coverage for the molecular biomarkers test that was done by [facility] one. We will contact the company to see if she would help with this situation. The patient will continue with cycle #5 today as scheduled. He would come back for follow-up visit in 3 weeks with the next cycle of his chemotherapy. He was advised to call immediately if he has any concerning symptoms in the interval.  Disclaimer: This note was dictated with voice recognition  software. Similar sounding words can inadvertently be transcribed and may not be corrected upon review. Eilleen Kempf., MD 11/11/2014

## 2014-11-08 NOTE — Telephone Encounter (Signed)
Per staff message and POF I have scheduled appts. Advised scheduler of appts. JMW  

## 2014-11-08 NOTE — Patient Instructions (Signed)
Continue labs and chemotherapy as scheduled Follow-up in 3 weeks prior to next scheduled cycle of chemotherapy

## 2014-11-08 NOTE — Progress Notes (Signed)
Quick Note:  Call patient with the result and he needs K replacement ______

## 2014-11-08 NOTE — Telephone Encounter (Signed)
Lft msg for pt confirming labs/ov per 12/23 POF, sent msg to add chemo and then mailed sch to pt... KJ

## 2014-11-09 ENCOUNTER — Telehealth: Payer: Self-pay | Admitting: Medical Oncology

## 2014-11-09 ENCOUNTER — Other Ambulatory Visit: Payer: Self-pay | Admitting: Medical Oncology

## 2014-11-09 DIAGNOSIS — K1379 Other lesions of oral mucosa: Secondary | ICD-10-CM

## 2014-11-09 DIAGNOSIS — E876 Hypokalemia: Secondary | ICD-10-CM

## 2014-11-09 DIAGNOSIS — B3781 Candidal esophagitis: Secondary | ICD-10-CM

## 2014-11-09 DIAGNOSIS — T451X5A Adverse effect of antineoplastic and immunosuppressive drugs, initial encounter: Secondary | ICD-10-CM

## 2014-11-09 MED ORDER — FLUCONAZOLE 100 MG PO TABS: 100.0000 mg | ORAL_TABLET | Freq: Every day | ORAL | Status: DC

## 2014-11-09 MED ORDER — POTASSIUM CHLORIDE CRYS ER 20 MEQ PO TBCR: 20.0000 meq | EXTENDED_RELEASE_TABLET | Freq: Every day | ORAL | Status: DC

## 2014-11-09 MED ORDER — NYSTATIN 100000 UNIT/ML MT SUSP: 5.0000 mL | Freq: Four times a day (QID) | OROMUCOSAL | Status: DC

## 2014-11-09 NOTE — Telephone Encounter (Signed)
-----   Message from Curt Bears, MD sent at 11/08/2014  7:39 PM EST ----- Call patient with the result and he needs K replacement

## 2014-11-09 NOTE — Telephone Encounter (Signed)
Pt reports mouth sores" white " . rx sent in per [employer].

## 2014-11-15 ENCOUNTER — Other Ambulatory Visit (HOSPITAL_BASED_OUTPATIENT_CLINIC_OR_DEPARTMENT_OTHER): Payer: BC Managed Care – PPO

## 2014-11-15 DIAGNOSIS — C801 Malignant (primary) neoplasm, unspecified: Secondary | ICD-10-CM

## 2014-11-15 DIAGNOSIS — C3431 Malignant neoplasm of lower lobe, right bronchus or lung: Secondary | ICD-10-CM

## 2014-11-15 LAB — CBC WITH DIFFERENTIAL/PLATELET
BASO%: 1.9 % (ref 0.0–2.0)
Basophils Absolute: 0 10*3/uL (ref 0.0–0.1)
EOS ABS: 0.1 10*3/uL (ref 0.0–0.5)
EOS%: 7 % (ref 0.0–7.0)
HCT: 32.2 % — ABNORMAL LOW (ref 38.4–49.9)
HGB: 11.2 g/dL — ABNORMAL LOW (ref 13.0–17.1)
LYMPH%: 52.1 % — AB (ref 14.0–49.0)
MCH: 36.2 pg — ABNORMAL HIGH (ref 27.2–33.4)
MCHC: 34.7 g/dL (ref 32.0–36.0)
MCV: 104.5 fL — ABNORMAL HIGH (ref 79.3–98.0)
MONO#: 0.1 10*3/uL (ref 0.1–0.9)
MONO%: 5.6 % (ref 0.0–14.0)
NEUT%: 33.4 % — ABNORMAL LOW (ref 39.0–75.0)
NEUTROS ABS: 0.6 10*3/uL — AB (ref 1.5–6.5)
PLATELETS: 222 10*3/uL (ref 140–400)
RBC: 3.08 10*6/uL — AB (ref 4.20–5.82)
RDW: 15.7 % — AB (ref 11.0–14.6)
WBC: 1.8 10*3/uL — ABNORMAL LOW (ref 4.0–10.3)
lymph#: 0.9 10*3/uL (ref 0.9–3.3)

## 2014-11-15 LAB — COMPREHENSIVE METABOLIC PANEL (CC13)
ALBUMIN: 3.8 g/dL (ref 3.5–5.0)
ALT: 15 U/L (ref 0–55)
ANION GAP: 8 meq/L (ref 3–11)
AST: 23 U/L (ref 5–34)
Alkaline Phosphatase: 74 U/L (ref 40–150)
BUN: 14.9 mg/dL (ref 7.0–26.0)
CO2: 29 meq/L (ref 22–29)
CREATININE: 0.8 mg/dL (ref 0.7–1.3)
Calcium: 9.3 mg/dL (ref 8.4–10.4)
Chloride: 106 mEq/L (ref 98–109)
Glucose: 79 mg/dl (ref 70–140)
Potassium: 3.5 mEq/L (ref 3.5–5.1)
Sodium: 143 mEq/L (ref 136–145)
TOTAL PROTEIN: 6.5 g/dL (ref 6.4–8.3)
Total Bilirubin: 1.04 mg/dL (ref 0.20–1.20)

## 2014-11-22 ENCOUNTER — Telehealth: Payer: Self-pay | Admitting: *Deleted

## 2014-11-22 ENCOUNTER — Other Ambulatory Visit (HOSPITAL_BASED_OUTPATIENT_CLINIC_OR_DEPARTMENT_OTHER): Payer: BLUE CROSS/BLUE SHIELD

## 2014-11-22 DIAGNOSIS — C801 Malignant (primary) neoplasm, unspecified: Secondary | ICD-10-CM

## 2014-11-22 DIAGNOSIS — C3431 Malignant neoplasm of lower lobe, right bronchus or lung: Secondary | ICD-10-CM

## 2014-11-22 LAB — COMPREHENSIVE METABOLIC PANEL (CC13)
ALBUMIN: 3.9 g/dL (ref 3.5–5.0)
ALK PHOS: 88 U/L (ref 40–150)
ALT: 17 U/L (ref 0–55)
ANION GAP: 8 meq/L (ref 3–11)
AST: 25 U/L (ref 5–34)
BUN: 13.9 mg/dL (ref 7.0–26.0)
CO2: 30 mEq/L — ABNORMAL HIGH (ref 22–29)
Calcium: 9.2 mg/dL (ref 8.4–10.4)
Chloride: 107 mEq/L (ref 98–109)
Creatinine: 0.9 mg/dL (ref 0.7–1.3)
EGFR: >90 mL/min/{1.73_m2} (ref >90)
Glucose: 105 mg/dl (ref 70–140)
Potassium: 3.3 mEq/L — ABNORMAL LOW (ref 3.5–5.1)
Sodium: 145 mEq/L (ref 136–145)
TOTAL PROTEIN: 6.6 g/dL (ref 6.4–8.3)
Total Bilirubin: 0.87 mg/dL (ref 0.20–1.20)

## 2014-11-22 LAB — CBC WITH DIFFERENTIAL/PLATELET
BASO%: 0.1 % (ref 0.0–2.0)
BASOS ABS: 0 10*3/uL (ref 0.0–0.1)
EOS ABS: 0.1 10*3/uL (ref 0.0–0.5)
EOS%: 3.7 % (ref 0.0–7.0)
HCT: 30 % — ABNORMAL LOW (ref 38.4–49.9)
HEMOGLOBIN: 10.4 g/dL — AB (ref 13.0–17.1)
LYMPH%: 50.4 % — ABNORMAL HIGH (ref 14.0–49.0)
MCH: 35.1 pg — ABNORMAL HIGH (ref 27.2–33.4)
MCHC: 34.7 g/dL (ref 32.0–36.0)
MCV: 101.1 fL — AB (ref 79.3–98.0)
MONO#: 0.5 10*3/uL (ref 0.1–0.9)
MONO%: 19.7 % — ABNORMAL HIGH (ref 0.0–14.0)
NEUT#: 0.7 10*3/uL — ABNORMAL LOW (ref 1.5–6.5)
NEUT%: 26.1 % — ABNORMAL LOW (ref 39.0–75.0)
PLATELETS: 57 10*3/uL — AB (ref 140–400)
RBC: 2.97 10*6/uL — ABNORMAL LOW (ref 4.20–5.82)
RDW: 15.5 % — ABNORMAL HIGH (ref 11.0–14.6)
WBC: 2.6 10*3/uL — ABNORMAL LOW (ref 4.0–10.3)
lymph#: 1.3 10*3/uL (ref 0.9–3.3)

## 2014-11-22 NOTE — Telephone Encounter (Signed)
Called to notify patient of potassium result from today's labwork. Per Awilda Metro, PA patient needs to take 71mEq potassium daily for 7 days. Patient states he does not need refill as he has plenty of 20 mEq potassium tablets at home. Called pharmacy and let them know refill is not needed. Told patient Adrena's instructions - he states he will start taking potassium daily. Reviewed potassium rich foods with patient as well. Let patient know his [place] today was 0.7. Encouraged frequent hand washing, avoiding large crowds or anyone that he knows has been since and to please call us if he develops a fever greater than 100.5 F or any other cold/flu-like symptoms. Patient states he is just now getting over a cold but will call if further symptoms develop.

## 2014-11-29 ENCOUNTER — Telehealth: Payer: Self-pay | Admitting: Medical Oncology

## 2014-11-29 ENCOUNTER — Ambulatory Visit (HOSPITAL_BASED_OUTPATIENT_CLINIC_OR_DEPARTMENT_OTHER): Payer: BLUE CROSS/BLUE SHIELD

## 2014-11-29 ENCOUNTER — Other Ambulatory Visit (HOSPITAL_BASED_OUTPATIENT_CLINIC_OR_DEPARTMENT_OTHER): Payer: BLUE CROSS/BLUE SHIELD

## 2014-11-29 ENCOUNTER — Other Ambulatory Visit: Payer: Self-pay | Admitting: Medical Oncology

## 2014-11-29 ENCOUNTER — Encounter: Payer: Self-pay | Admitting: Internal Medicine

## 2014-11-29 ENCOUNTER — Ambulatory Visit (HOSPITAL_BASED_OUTPATIENT_CLINIC_OR_DEPARTMENT_OTHER): Payer: BLUE CROSS/BLUE SHIELD | Admitting: Internal Medicine

## 2014-11-29 VITALS — BP 161/89 | HR 84 | Temp 98.0°F | Resp 18 | Ht 71.0 in | Wt 153.1 lb

## 2014-11-29 DIAGNOSIS — Z5111 Encounter for antineoplastic chemotherapy: Secondary | ICD-10-CM

## 2014-11-29 DIAGNOSIS — C801 Malignant (primary) neoplasm, unspecified: Secondary | ICD-10-CM

## 2014-11-29 DIAGNOSIS — C3431 Malignant neoplasm of lower lobe, right bronchus or lung: Secondary | ICD-10-CM

## 2014-11-29 DIAGNOSIS — C3432 Malignant neoplasm of lower lobe, left bronchus or lung: Secondary | ICD-10-CM

## 2014-11-29 DIAGNOSIS — C3490 Malignant neoplasm of unspecified part of unspecified bronchus or lung: Secondary | ICD-10-CM

## 2014-11-29 LAB — COMPREHENSIVE METABOLIC PANEL (CC13)
ALT: 9 U/L (ref 0–55)
AST: 20 U/L (ref 5–34)
Albumin: 3.9 g/dL (ref 3.5–5.0)
Alkaline Phosphatase: 82 U/L (ref 40–150)
Anion Gap: 9 mEq/L (ref 3–11)
BILIRUBIN TOTAL: 0.66 mg/dL (ref 0.20–1.20)
BUN: 12.3 mg/dL (ref 7.0–26.0)
CO2: 29 mEq/L (ref 22–29)
CREATININE: 0.8 mg/dL (ref 0.7–1.3)
Calcium: 9 mg/dL (ref 8.4–10.4)
Chloride: 106 mEq/L (ref 98–109)
EGFR: >90 mL/min/{1.73_m2} (ref >90)
Glucose: 148 mg/dl — ABNORMAL HIGH (ref 70–140)
Potassium: 3.3 mEq/L — ABNORMAL LOW (ref 3.5–5.1)
Sodium: 143 mEq/L (ref 136–145)
Total Protein: 6.9 g/dL (ref 6.4–8.3)

## 2014-11-29 LAB — CBC WITH DIFFERENTIAL/PLATELET
BASO%: 0.2 % (ref 0.0–2.0)
BASOS ABS: 0 10*3/uL (ref 0.0–0.1)
EOS%: 2 % (ref 0.0–7.0)
Eosinophils Absolute: 0.1 10*3/uL (ref 0.0–0.5)
HEMATOCRIT: 29.9 % — AB (ref 38.4–49.9)
HGB: 10.6 g/dL — ABNORMAL LOW (ref 13.0–17.1)
LYMPH%: 46.1 % (ref 14.0–49.0)
MCH: 35.5 pg — AB (ref 27.2–33.4)
MCHC: 35.5 g/dL (ref 32.0–36.0)
MCV: 100 fL — ABNORMAL HIGH (ref 79.3–98.0)
MONO#: 1 10*3/uL — ABNORMAL HIGH (ref 0.1–0.9)
MONO%: 19.7 % — AB (ref 0.0–14.0)
NEUT#: 1.6 10*3/uL (ref 1.5–6.5)
NEUT%: 32 % — ABNORMAL LOW (ref 39.0–75.0)
Platelets: 266 10*3/uL (ref 140–400)
RBC: 2.99 10*6/uL — AB (ref 4.20–5.82)
RDW: 17.4 % — AB (ref 11.0–14.6)
WBC: 5.1 10*3/uL (ref 4.0–10.3)
lymph#: 2.3 10*3/uL (ref 0.9–3.3)

## 2014-11-29 MED ORDER — ONDANSETRON 16 MG/50ML IVPB (CHCC)
INTRAVENOUS | Status: AC
  Filled 2014-11-29: qty 16

## 2014-11-29 MED ORDER — CYANOCOBALAMIN 1000 MCG/ML IJ SOLN
INTRAMUSCULAR | Status: AC
  Filled 2014-11-29: qty 1

## 2014-11-29 MED ORDER — CYANOCOBALAMIN 1000 MCG/ML IJ SOLN
1000.0000 ug | Freq: Once | INTRAMUSCULAR | Status: AC
  Administered 2014-11-29: 1000 ug via INTRAMUSCULAR

## 2014-11-29 MED ORDER — ONDANSETRON 16 MG/50ML IVPB (CHCC)
16.0000 mg | Freq: Once | INTRAVENOUS | Status: AC
  Administered 2014-11-29: 16 mg via INTRAVENOUS

## 2014-11-29 MED ORDER — SODIUM CHLORIDE 0.9 % IV SOLN
565.5000 mg | Freq: Once | INTRAVENOUS | Status: AC
  Administered 2014-11-29: 570 mg via INTRAVENOUS
  Filled 2014-11-29: qty 57

## 2014-11-29 MED ORDER — OXYCODONE-ACETAMINOPHEN 5-325 MG PO TABS
1.0000 | ORAL_TABLET | Freq: Once | ORAL | Status: AC
  Administered 2014-11-29: 1 via ORAL

## 2014-11-29 MED ORDER — SODIUM CHLORIDE 0.9 % IV SOLN
Freq: Once | INTRAVENOUS | Status: AC
  Administered 2014-11-29: 12:00:00 via INTRAVENOUS

## 2014-11-29 MED ORDER — FOLIC ACID 1 MG PO TABS: 1.0000 mg | ORAL_TABLET | Freq: Every day | ORAL | Status: AC

## 2014-11-29 MED ORDER — OXYCODONE-ACETAMINOPHEN 5-325 MG PO TABS
ORAL_TABLET | ORAL | Status: AC
Start: 2014-11-29 — End: 2014-11-29
  Filled 2014-11-29: qty 1

## 2014-11-29 MED ORDER — SODIUM CHLORIDE 0.9 % IV SOLN
490.0000 mg/m2 | Freq: Once | INTRAVENOUS | Status: AC
  Administered 2014-11-29: 900 mg via INTRAVENOUS
  Filled 2014-11-29: qty 36

## 2014-11-29 MED ORDER — DEXAMETHASONE SODIUM PHOSPHATE 20 MG/5ML IJ SOLN
INTRAMUSCULAR | Status: AC
  Filled 2014-11-29: qty 5

## 2014-11-29 MED ORDER — DEXAMETHASONE SODIUM PHOSPHATE 20 MG/5ML IJ SOLN
20.0000 mg | Freq: Once | INTRAMUSCULAR | Status: AC
  Administered 2014-11-29: 20 mg via INTRAVENOUS

## 2014-11-29 NOTE — Patient Instructions (Signed)
[facility] Discharge Instructions for Patients Receiving Chemotherapy  Today you received the following chemotherapy agents alimta/carboplatin  To help prevent nausea and vomiting after your treatment, we encourage you to take your nausea medication as directed   If you develop nausea and vomiting that is not controlled by your nausea medication, call the clinic.   BELOW ARE SYMPTOMS THAT SHOULD BE REPORTED IMMEDIATELY:  *FEVER GREATER THAN 100.5 F  *CHILLS WITH OR WITHOUT FEVER  NAUSEA AND VOMITING THAT IS NOT CONTROLLED WITH YOUR NAUSEA MEDICATION  *UNUSUAL SHORTNESS OF BREATH  *UNUSUAL BRUISING OR BLEEDING  TENDERNESS IN MOUTH AND THROAT WITH OR WITHOUT PRESENCE OF ULCERS  *URINARY PROBLEMS  *BOWEL PROBLEMS  UNUSUAL RASH Items with * indicate a potential emergency and should be followed up as soon as possible.  Feel free to call the clinic you have any questions or concerns. The clinic phone number is [phone].

## 2014-11-29 NOTE — Progress Notes (Signed)
[facility] [phone]   Fax:([phone]) [place], 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  Genomic Alterations Identified? FGFR2 A97T KRAS Q61H - subclonal? ARID1A [id number]* - subclonal?  PRIOR THERAPY: Status post right video-assisted thoracoscopy, drainage of pleural effusion, pleural biopsy, partial decortication, talc pleurodesis.under the care of Dr. Roxan Hockey on 06/23/2014.  CURRENT THERAPY: Systemic chemotherapy with carboplatin for AUC of 5 and Alimta 500 mg/M2 every 3 weeks. He is status post 5 cycles.  INTERVAL HISTORY: Henry [name] 64 y.o. male returns to the clinic today for follow-up visit. The patient tolerated the last cycle of her systemic chemotherapy fairly well with no significant adverse effects. He was recently treated for oral thrush with Diflucan. He denied having any significant fever or chills, no nausea or vomiting. The patient denied having any significant chest pain, shortness breath, cough or hemoptysis. He has no significant weight loss or night sweats. He has denial from his insurance for coverage of the molecular study that were performed for the metastatic adenocarcinoma. He is requesting some help to deal with this issue. He is here today to start cycle #6 of his chemotherapy.  MEDICAL HISTORY: Past Medical History  Diagnosis Date  . Hypertension   . GERD (gastroesophageal reflux disease)   . Hemochromatosis   . Hx of adenomatous colonic polyps   . Umbilical hernia     none since weight loss  . Diverticulosis   . Lumbar spondylosis     ankle  . Pleural effusion on right 12/16/13    per chest xray  . Shortness of breath     with  exertion  . Chronic pancreatitis   . IBS (irritable bowel  syndrome)   . Lung cancer 06/2014  . 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  MEDICATIONS:  Current Outpatient Prescriptions  Medication Sig Dispense Refill  . albuterol (PROVENTIL HFA;VENTOLIN HFA) 108 (90 BASE) MCG/ACT inhaler Inhale 1-2 puffs into the lungs every 4 (four) hours as needed for wheezing or shortness of breath.     . dexamethasone (DECADRON) 4 MG tablet 4 mg by mouth twice a day the day before, day of and day after the chemotherapy every 3 weeks. (Patient not taking: Reported on 11/08/2014) 40 tablet 1  . diltiazem (CARDIZEM CD) 360 MG 24 hr capsule Take 360 mg by mouth every morning.     . fluconazole (DIFLUCAN) 100 MG tablet Take 1 tablet (100 mg total) by mouth daily. 14 tablet 0  . folic acid (FOLVITE) 1 MG tablet Take 1 tablet (1 mg total) by mouth daily. 30 tablet 3  . hydrochlorothiazide (HYDRODIURIL) 25 MG tablet Take 25 mg by mouth every evening.     Marland Kitchen ibuprofen (ADVIL,MOTRIN) 200 MG tablet Take 400 mg by mouth every 6 (six) hours as needed (Pain).    . megestrol (MEGACE ORAL) 40 MG/ML suspension Take 5 mLs (200 mg total) by mouth daily. (Patient not taking: Reported on 11/08/2014) 240 mL 1  . nebivolol (BYSTOLIC) 5 MG tablet Take 5 mg by mouth every evening.     . nystatin (MYCOSTATIN) 100000 UNIT/ML suspension Take 5 mLs (500,000 Units total) by mouth 4 (four) times daily. 60 mL 0  . oxycodone (OXY-IR) 5 MG capsule Take 1 capsule (5  mg total) by mouth every 4 (four) hours as needed. 30 capsule 0  . Pancrelipase, Lip-Prot-Amyl, 20880 UNITS TABS Take 2 tablets by mouth 3 (three) times daily with meals. (Patient not taking: Reported on 11/08/2014) 540 tablet 2  . potassium chloride SA (K-DUR,KLOR-CON) 20 MEQ tablet Take 40 mEq by mouth daily.     . potassium chloride SA (K-DUR,KLOR-CON) 20 MEQ tablet Take 1 tablet (20 mEq total) by mouth daily. 10 tablet 0  . Probiotic Product (ALIGN) 4 MG CAPS Take 4 mg by mouth daily.     .  prochlorperazine (COMPAZINE) 10 MG tablet Take 1 tablet (10 mg total) by mouth every 6 (six) hours as needed for nausea or vomiting. (Patient not taking: Reported on 11/08/2014) 30 tablet 0  . tamsulosin (FLOMAX) 0.4 MG CAPS capsule     . VOLTAREN 1 % GEL Apply 2 g topically 3 (three) times a week. Used on ankle     No current facility-administered medications for this visit.    SURGICAL HISTORY:  Past Surgical History  Procedure Laterality Date  . Lumbar laminectomy  1983  . Knee arthrsoscopy  2003    left  . Tonsillectomy  1962  . Eus N/A 04/07/2013    Procedure: UPPER ENDOSCOPIC ULTRASOUND (EUS) LINEAR;  Surgeon: [name], MD;  Location: [facility];  Service: Endoscopy;  Laterality: N/A;  . Video assisted thoracoscopy (vats)/decortication Right 06/22/2014    Procedure: VIDEO ASSISTED THORACOSCOPY (VATS)/DECORTICATION;  Surgeon: [name], MD;  Location: [facility];  Service: Thoracic;  Laterality: Right;  . Pleural effusion drainage Right 06/22/2014    Procedure: DRAINAGE OF PLEURAL EFFUSION;  Surgeon: [name], MD;  Location: [facility];  Service: Thoracic;  Laterality: Right;  . Eus N/A 07/20/2014    Procedure: UPPER ENDOSCOPIC ULTRASOUND (EUS) LINEAR;  Surgeon: [name], MD;  Location: [facility];  Service: Endoscopy;  Laterality: N/A;    REVIEW OF SYSTEMS:  A comprehensive review of systems was negative except for: Constitutional: positive for fatigue   PHYSICAL EXAMINATION: General appearance: alert, cooperative and no distress Head: Normocephalic, without obvious abnormality, atraumatic Neck: no adenopathy, no JVD, supple, symmetrical, trachea midline and thyroid not enlarged, symmetric, no tenderness/mass/nodules Lymph nodes: Cervical, supraclavicular, and axillary nodes normal. Resp: clear to auscultation bilaterally Back: symmetric, no curvature. ROM normal. No CVA tenderness. Cardio: regular rate and rhythm, S1, S2 normal, no murmur, click, rub or  gallop GI: soft, non-tender; bowel sounds normal; no masses,  no organomegaly Extremities: extremities normal, atraumatic, no cyanosis or edema  ECOG PERFORMANCE STATUS: 1 - Symptomatic but completely ambulatory  Blood pressure 161/89, pulse 84, temperature 98 F (36.7 C), temperature source Oral, resp. rate 18, height _0  (1.803 m), weight 153 lb 1.6 oz (69.446 kg), SpO2 100 %.  LABORATORY DATA: Lab Results  Component Value Date   WBC 5.1 11/29/2014   HGB 10.6* 11/29/2014   HCT 29.9* 11/29/2014   MCV 100.0* 11/29/2014   PLT 266 11/29/2014      Chemistry      Component Value Date/Time   NA 143 11/29/2014 1009   NA 140 06/24/2014 0346   K 3.3* 11/29/2014 1009   K 3.3* 06/24/2014 0346   CL 102 06/24/2014 0346   CO2 29 11/29/2014 1009   CO2 28 06/24/2014 0346   BUN 12.3 11/29/2014 1009   BUN 11 06/24/2014 0346   CREATININE 0.8 11/29/2014 1009   CREATININE 0.75 06/24/2014 0346      Component Value  Date/Time   CALCIUM 9.0 11/29/2014 1009   CALCIUM 8.6 06/24/2014 0346   ALKPHOS 82 11/29/2014 1009   ALKPHOS 54 06/24/2014 0346   AST 20 11/29/2014 1009   AST 13 06/24/2014 0346   ALT 9 11/29/2014 1009   ALT 11 06/24/2014 0346   BILITOT 0.66 11/29/2014 1009   BILITOT 0.6 06/24/2014 0346       RADIOGRAPHIC STUDIES: No results found.  ASSESSMENT AND PLAN: This is a very pleasant 64 years old white male with metastatic adenocarcinoma of unknown primary questionable for upper gastrointestinal, pancreatic or biliary or primary lung cancer. He is currently on systemic chemotherapy with carboplatin and Alimta status post 5 cycles and tolerating his treatment fairly well. I recommended for the patient to proceed with cycle #6 today as a scheduled. He would come back for follow-up visit in 3 weeks after repeating CT scan of the chest, abdomen and pelvis for restaging of his disease. Regarding the insurance denial of the molecular study, we will contact his insurance company with  the appropriate information and diagnosis that could have been missed on the initial filling from the billing department. The patient has no current oral thrush but if he developed recurrence, we will consider him for treatment with Diflucan 100 mg by mouth daily for 10 days. He was advised to call if he developed oral thrush or any other symptoms. He was advised to call immediately if he has any concerning symptoms in the interval. The patient voices understanding of current disease status and treatment options and is in agreement with the current care plan.  All questions were answered. The patient knows to call the clinic with any problems, questions or concerns. We can certainly see the patient much sooner if necessary.  Disclaimer: This note was dictated with voice recognition software. Similar sounding words can inadvertently be transcribed and may not be corrected upon review.

## 2014-11-29 NOTE — Telephone Encounter (Signed)
Path report and office note and molecular results faxed to [facility]

## 2014-12-01 ENCOUNTER — Telehealth: Payer: Self-pay | Admitting: *Deleted

## 2014-12-01 ENCOUNTER — Telehealth: Payer: Self-pay | Admitting: Internal Medicine

## 2014-12-01 DIAGNOSIS — K1379 Other lesions of oral mucosa: Secondary | ICD-10-CM

## 2014-12-01 DIAGNOSIS — B3781 Candidal esophagitis: Secondary | ICD-10-CM

## 2014-12-01 DIAGNOSIS — T451X5A Adverse effect of antineoplastic and immunosuppressive drugs, initial encounter: Secondary | ICD-10-CM

## 2014-12-01 MED ORDER — FLUCONAZOLE 100 MG PO TABS: 100.0000 mg | ORAL_TABLET | Freq: Every day | ORAL | Status: DC

## 2014-12-01 NOTE — Telephone Encounter (Signed)
Pt called stating that he has thrush.  States that he has sores and is having trouble swallowing.  Per Dr Vista Mink, ok to give diflucan 1 tab daily x 7 days.

## 2014-12-01 NOTE — Telephone Encounter (Signed)
Per staff message and POF I have scheduled appts. Advised scheduler of appts. JMW  

## 2014-12-01 NOTE — Telephone Encounter (Signed)
LM to confirm appts.

## 2014-12-06 ENCOUNTER — Other Ambulatory Visit (HOSPITAL_BASED_OUTPATIENT_CLINIC_OR_DEPARTMENT_OTHER): Payer: BLUE CROSS/BLUE SHIELD

## 2014-12-06 DIAGNOSIS — C801 Malignant (primary) neoplasm, unspecified: Secondary | ICD-10-CM

## 2014-12-06 DIAGNOSIS — C3432 Malignant neoplasm of lower lobe, left bronchus or lung: Secondary | ICD-10-CM

## 2014-12-06 LAB — COMPREHENSIVE METABOLIC PANEL (CC13)
ALT: 18 U/L (ref 0–55)
AST: 26 U/L (ref 5–34)
Albumin: 3.8 g/dL (ref 3.5–5.0)
Alkaline Phosphatase: 73 U/L (ref 40–150)
Anion Gap: 5 mEq/L (ref 3–11)
BILIRUBIN TOTAL: 1.3 mg/dL — AB (ref 0.20–1.20)
BUN: 14 mg/dL (ref 7.0–26.0)
CO2: 31 mEq/L — ABNORMAL HIGH (ref 22–29)
CREATININE: 0.8 mg/dL (ref 0.7–1.3)
Calcium: 8.8 mg/dL (ref 8.4–10.4)
Chloride: 107 mEq/L (ref 98–109)
EGFR: >90 mL/min/{1.73_m2} (ref >90)
Glucose: 111 mg/dl (ref 70–140)
POTASSIUM: 3.3 meq/L — AB (ref 3.5–5.1)
Sodium: 143 mEq/L (ref 136–145)
TOTAL PROTEIN: 6.4 g/dL (ref 6.4–8.3)

## 2014-12-06 LAB — CBC WITH DIFFERENTIAL/PLATELET
BASO%: 1.1 % (ref 0.0–2.0)
BASOS ABS: 0 10*3/uL (ref 0.0–0.1)
EOS%: 4.1 % (ref 0.0–7.0)
Eosinophils Absolute: 0.1 10*3/uL (ref 0.0–0.5)
HCT: 28.2 % — ABNORMAL LOW (ref 38.4–49.9)
HGB: 9.5 g/dL — ABNORMAL LOW (ref 13.0–17.1)
LYMPH#: 1 10*3/uL (ref 0.9–3.3)
LYMPH%: 37.7 % (ref 14.0–49.0)
MCH: 34.8 pg — ABNORMAL HIGH (ref 27.2–33.4)
MCHC: 33.8 g/dL (ref 32.0–36.0)
MCV: 103 fL — ABNORMAL HIGH (ref 79.3–98.0)
MONO#: 0.1 10*3/uL (ref 0.1–0.9)
MONO%: 5.5 % (ref 0.0–14.0)
NEUT%: 51.6 % (ref 39.0–75.0)
NEUTROS ABS: 1.4 10*3/uL — AB (ref 1.5–6.5)
Platelets: 232 10*3/uL (ref 140–400)
RBC: 2.73 10*6/uL — ABNORMAL LOW (ref 4.20–5.82)
RDW: 17 % — AB (ref 11.0–14.6)
WBC: 2.6 10*3/uL — ABNORMAL LOW (ref 4.0–10.3)

## 2014-12-08 ENCOUNTER — Other Ambulatory Visit: Payer: Self-pay | Admitting: Internal Medicine

## 2014-12-08 DIAGNOSIS — Z85118 Personal history of other malignant neoplasm of bronchus and lung: Secondary | ICD-10-CM

## 2014-12-13 ENCOUNTER — Ambulatory Visit (HOSPITAL_COMMUNITY): Payer: BLUE CROSS/BLUE SHIELD

## 2014-12-13 ENCOUNTER — Other Ambulatory Visit (HOSPITAL_BASED_OUTPATIENT_CLINIC_OR_DEPARTMENT_OTHER): Payer: BLUE CROSS/BLUE SHIELD

## 2014-12-13 DIAGNOSIS — C3431 Malignant neoplasm of lower lobe, right bronchus or lung: Secondary | ICD-10-CM

## 2014-12-13 LAB — COMPREHENSIVE METABOLIC PANEL (CC13)
ALBUMIN: 3.8 g/dL (ref 3.5–5.0)
ALT: 15 U/L (ref 0–55)
AST: 22 U/L (ref 5–34)
Alkaline Phosphatase: 71 U/L (ref 40–150)
Anion Gap: 9 mEq/L (ref 3–11)
BUN: 14.1 mg/dL (ref 7.0–26.0)
CALCIUM: 8.8 mg/dL (ref 8.4–10.4)
CHLORIDE: 107 meq/L (ref 98–109)
CO2: 26 mEq/L (ref 22–29)
Creatinine: 0.8 mg/dL (ref 0.7–1.3)
EGFR: >90 mL/min/{1.73_m2} (ref >90)
Glucose: 121 mg/dl (ref 70–140)
Potassium: 3.4 mEq/L — ABNORMAL LOW (ref 3.5–5.1)
Sodium: 142 mEq/L (ref 136–145)
Total Bilirubin: 0.9 mg/dL (ref 0.20–1.20)
Total Protein: 6.3 g/dL — ABNORMAL LOW (ref 6.4–8.3)

## 2014-12-13 LAB — CBC WITH DIFFERENTIAL/PLATELET
BASO%: 0.2 % (ref 0.0–2.0)
BASOS ABS: 0 10*3/uL (ref 0.0–0.1)
EOS ABS: 0 10*3/uL (ref 0.0–0.5)
EOS%: 1.5 % (ref 0.0–7.0)
HEMATOCRIT: 25 % — AB (ref 38.4–49.9)
HGB: 8.5 g/dL — ABNORMAL LOW (ref 13.0–17.1)
LYMPH%: 42 % (ref 14.0–49.0)
MCH: 34.7 pg — AB (ref 27.2–33.4)
MCHC: 34 g/dL (ref 32.0–36.0)
MCV: 102.2 fL — AB (ref 79.3–98.0)
MONO#: 0.7 10*3/uL (ref 0.1–0.9)
MONO%: 21.3 % — AB (ref 0.0–14.0)
NEUT%: 35 % — ABNORMAL LOW (ref 39.0–75.0)
NEUTROS ABS: 1.2 10*3/uL — AB (ref 1.5–6.5)
Platelets: 63 10*3/uL — ABNORMAL LOW (ref 140–400)
RBC: 2.45 10*6/uL — ABNORMAL LOW (ref 4.20–5.82)
RDW: 16.6 % — AB (ref 11.0–14.6)
WBC: 3.3 10*3/uL — ABNORMAL LOW (ref 4.0–10.3)
lymph#: 1.4 10*3/uL (ref 0.9–3.3)

## 2014-12-19 ENCOUNTER — Ambulatory Visit (HOSPITAL_COMMUNITY): Payer: BLUE CROSS/BLUE SHIELD

## 2014-12-20 ENCOUNTER — Ambulatory Visit (HOSPITAL_COMMUNITY)
Admission: RE | Admit: 2014-12-20 | Discharge: 2014-12-20 | Disposition: A | Discharge status: Home / Self Care | Payer: BLUE CROSS/BLUE SHIELD | Source: Ambulatory Visit | Attending: Internal Medicine | Admitting: Internal Medicine

## 2014-12-20 ENCOUNTER — Ambulatory Visit (HOSPITAL_BASED_OUTPATIENT_CLINIC_OR_DEPARTMENT_OTHER): Payer: BLUE CROSS/BLUE SHIELD | Admitting: Internal Medicine

## 2014-12-20 ENCOUNTER — Other Ambulatory Visit (HOSPITAL_BASED_OUTPATIENT_CLINIC_OR_DEPARTMENT_OTHER): Payer: BLUE CROSS/BLUE SHIELD

## 2014-12-20 ENCOUNTER — Telehealth: Payer: Self-pay | Admitting: *Deleted

## 2014-12-20 ENCOUNTER — Encounter: Payer: Self-pay | Admitting: Internal Medicine

## 2014-12-20 ENCOUNTER — Ambulatory Visit (HOSPITAL_BASED_OUTPATIENT_CLINIC_OR_DEPARTMENT_OTHER): Payer: BLUE CROSS/BLUE SHIELD

## 2014-12-20 ENCOUNTER — Encounter (HOSPITAL_COMMUNITY): Payer: Self-pay

## 2014-12-20 ENCOUNTER — Other Ambulatory Visit: Payer: Self-pay | Admitting: *Deleted

## 2014-12-20 VITALS — BP 163/92 | HR 72 | Temp 98.6°F | Resp 18 | Ht 71.0 in | Wt 153.7 lb

## 2014-12-20 DIAGNOSIS — Z5111 Encounter for antineoplastic chemotherapy: Secondary | ICD-10-CM

## 2014-12-20 DIAGNOSIS — C3490 Malignant neoplasm of unspecified part of unspecified bronchus or lung: Secondary | ICD-10-CM

## 2014-12-20 DIAGNOSIS — C3491 Malignant neoplasm of unspecified part of right bronchus or lung: Secondary | ICD-10-CM

## 2014-12-20 DIAGNOSIS — C801 Malignant (primary) neoplasm, unspecified: Secondary | ICD-10-CM

## 2014-12-20 DIAGNOSIS — C3432 Malignant neoplasm of lower lobe, left bronchus or lung: Secondary | ICD-10-CM | POA: Diagnosis not present

## 2014-12-20 DIAGNOSIS — B370 Candidal stomatitis: Secondary | ICD-10-CM

## 2014-12-20 DIAGNOSIS — R918 Other nonspecific abnormal finding of lung field: Secondary | ICD-10-CM

## 2014-12-20 DIAGNOSIS — C3431 Malignant neoplasm of lower lobe, right bronchus or lung: Secondary | ICD-10-CM

## 2014-12-20 LAB — COMPREHENSIVE METABOLIC PANEL (CC13)
ALBUMIN: 3.8 g/dL (ref 3.5–5.0)
ALT: 15 U/L (ref 0–55)
AST: 19 U/L (ref 5–34)
Alkaline Phosphatase: 78 U/L (ref 40–150)
Anion Gap: 9 mEq/L (ref 3–11)
BILIRUBIN TOTAL: 0.73 mg/dL (ref 0.20–1.20)
BUN: 9.8 mg/dL (ref 7.0–26.0)
CO2: 21 meq/L — AB (ref 22–29)
Calcium: 8.3 mg/dL — ABNORMAL LOW (ref 8.4–10.4)
Chloride: 111 mEq/L — ABNORMAL HIGH (ref 98–109)
Creatinine: 0.8 mg/dL (ref 0.7–1.3)
EGFR: >90 mL/min/{1.73_m2} (ref >90)
Glucose: 87 mg/dl (ref 70–140)
POTASSIUM: 4.1 meq/L (ref 3.5–5.1)
Sodium: 141 mEq/L (ref 136–145)
TOTAL PROTEIN: 6.3 g/dL — AB (ref 6.4–8.3)

## 2014-12-20 LAB — CBC WITH DIFFERENTIAL/PLATELET
BASO%: 0.5 % (ref 0.0–2.0)
Basophils Absolute: 0 10*3/uL (ref 0.0–0.1)
EOS ABS: 0.1 10*3/uL (ref 0.0–0.5)
EOS%: 3.3 % (ref 0.0–7.0)
HEMATOCRIT: 29.4 % — AB (ref 38.4–49.9)
HEMOGLOBIN: 10 g/dL — AB (ref 13.0–17.1)
LYMPH%: 35.7 % (ref 14.0–49.0)
MCH: 37 pg — ABNORMAL HIGH (ref 27.2–33.4)
MCHC: 33.9 g/dL (ref 32.0–36.0)
MCV: 109.1 fL — AB (ref 79.3–98.0)
MONO#: 0.9 10*3/uL (ref 0.1–0.9)
MONO%: 22.1 % — AB (ref 0.0–14.0)
NEUT#: 1.6 10*3/uL (ref 1.5–6.5)
NEUT%: 38.4 % — ABNORMAL LOW (ref 39.0–75.0)
PLATELETS: 256 10*3/uL (ref 140–400)
RBC: 2.7 10*6/uL — AB (ref 4.20–5.82)
RDW: 22.4 % — AB (ref 11.0–14.6)
WBC: 4.2 10*3/uL (ref 4.0–10.3)
lymph#: 1.5 10*3/uL (ref 0.9–3.3)

## 2014-12-20 MED ORDER — DEXAMETHASONE SODIUM PHOSPHATE 10 MG/ML IJ SOLN
INTRAMUSCULAR | Status: AC
  Filled 2014-12-20: qty 1

## 2014-12-20 MED ORDER — IOHEXOL 300 MG/ML  SOLN
100.0000 mL | Freq: Once | INTRAMUSCULAR | Status: AC | PRN
  Administered 2014-12-20: 100 mL via INTRAVENOUS

## 2014-12-20 MED ORDER — DEXAMETHASONE SODIUM PHOSPHATE 10 MG/ML IJ SOLN
10.0000 mg | Freq: Once | INTRAMUSCULAR | Status: AC
  Administered 2014-12-20: 10 mg via INTRAVENOUS

## 2014-12-20 MED ORDER — SODIUM CHLORIDE 0.9 % IV SOLN
Freq: Once | INTRAVENOUS | Status: AC
  Administered 2014-12-20: 13:00:00 via INTRAVENOUS

## 2014-12-20 MED ORDER — ONDANSETRON 8 MG/50ML IVPB (CHCC)
8.0000 mg | Freq: Once | INTRAVENOUS | Status: AC
  Administered 2014-12-20: 8 mg via INTRAVENOUS

## 2014-12-20 MED ORDER — SODIUM CHLORIDE 0.9 % IV SOLN
900.0000 mg | Freq: Once | INTRAVENOUS | Status: AC
  Administered 2014-12-20: 900 mg via INTRAVENOUS
  Filled 2014-12-20: qty 36

## 2014-12-20 MED ORDER — ONDANSETRON 8 MG/NS 50 ML IVPB
INTRAVENOUS | Status: AC
  Filled 2014-12-20: qty 8

## 2014-12-20 MED ORDER — FLUCONAZOLE 100 MG PO TABS: 100.0000 mg | ORAL_TABLET | ORAL | Status: DC

## 2014-12-20 NOTE — Telephone Encounter (Signed)
Per staff message and POF I have scheduled appts. Advised scheduler of appts. JMW  

## 2014-12-20 NOTE — Progress Notes (Signed)
[facility] [phone]   Fax:([phone]) [place], [place] 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  Genomic Alterations Identified? FGFR2 A97T KRAS Q61H - subclonal? ARID1A [id number]* - subclonal?  PRIOR THERAPY:  1) Status post right video-assisted thoracoscopy, drainage of pleural effusion, pleural biopsy, partial decortication, talc pleurodesis.under Henry care of Dr. Roxan Hockey on 06/23/2014. 2) Systemic chemotherapy with carboplatin for AUC of 5 and Alimta 500 mg/M2 every 3 weeks. Henry [name] is status post 6 cycles, with stable disease after cycle #6.  CURRENT THERAPY: Maintenance chemotherapy with single agent Alimta 500 MG/M2 every 3 weeks. First dose 12/20/2014.  INTERVAL HISTORY: Henry [name] 64 y.o. male returns to [facility] clinic today for follow-up visit. Henry [name] tolerated Henry last cycle of her systemic chemotherapy fairly well with no significant adverse effects. Henry [name] continues to get oral thrush and Henry [name] was treated with several courses of Diflucan and nystatin recently. Henry [name] denied having any significant fever or chills, no nausea or vomiting. Henry [name] denied having any significant chest pain, shortness of breath, cough or hemoptysis. Henry [name] has no significant weight loss or night sweats. Henry [name] had repeat CT scan of Henry chest, abdomen and pelvis performed earlier today and Henry [name] is here for evaluation and discussion of his scan results and recommendation regarding treatment of his condition.  MEDICAL HISTORY: Past Medical History  Diagnosis Date  . Hypertension   . GERD (gastroesophageal reflux disease)   . Hemochromatosis   . Hx of adenomatous colonic polyps   . Umbilical hernia     none since weight loss  . Diverticulosis   . Lumbar spondylosis     ankle  . Pleural effusion on right 12/16/13    per chest xray  . Shortness of breath     with  exertion  . Chronic pancreatitis   . IBS (irritable bowel syndrome)   . Lung cancer 06/2014  . 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  MEDICATIONS:  Current Outpatient Prescriptions  Medication Sig Dispense Refill  . albuterol (PROVENTIL HFA;VENTOLIN HFA) 108 (90 BASE) MCG/ACT inhaler Inhale 1-2 puffs into Henry lungs every 4 (four) hours as needed for wheezing or shortness of breath.     . dexamethasone (DECADRON) 4 MG tablet 4 mg by mouth twice a day Henry day before, day of and day after Henry chemotherapy every 3 weeks. 40 tablet 1  . diltiazem (CARDIZEM CD) 360 MG 24 hr capsule Take 360 mg by mouth every morning.     . fluconazole (DIFLUCAN) 100 MG tablet Take 1 tablet (100 mg total) by mouth daily. 7 tablet 0  . folic acid (FOLVITE) 1 MG tablet Take 1 tablet (1 mg total) by mouth daily. 30 tablet 3  . folic acid (FOLVITE) 1 MG tablet TAKE 1 TABLET (1 MG TOTAL) BY MOUTH DAILY. 30 tablet 3  . hydrochlorothiazide (HYDRODIURIL) 25 MG tablet Take 25 mg by mouth every evening.     Marland Kitchen ibuprofen (ADVIL,MOTRIN) 200 MG tablet Take 400 mg by mouth every 6 (six) hours as needed (Pain).    . nebivolol (BYSTOLIC) 5 MG tablet Take 5 mg by mouth every evening.     . nystatin (MYCOSTATIN) 100000 UNIT/ML suspension Take 5 mLs (500,000 Units total) by mouth 4 (four) times daily. ([name]  not taking: Reported on 11/29/2014) 60 mL 0  . oxycodone (OXY-IR) 5 MG capsule Take 1 capsule (5 mg total) by mouth every 4 (four) hours as needed. ([name] not taking: Reported on 11/29/2014) 30 capsule 0  . Pancrelipase, Lip-Prot-Amyl, 20880 UNITS TABS Take 2 tablets by mouth 3 (three) times daily with meals. ([name] not taking: Reported on 11/08/2014) 540 tablet 2  . potassium chloride SA (K-DUR,KLOR-CON) 20 MEQ tablet Take 1 tablet (20 mEq total) by mouth daily. 10 tablet 0  . Probiotic  Product (ALIGN) 4 MG CAPS Take 4 mg by mouth daily.     . prochlorperazine (COMPAZINE) 10 MG tablet Take 1 tablet (10 mg total) by mouth every 6 (six) hours as needed for nausea or vomiting. ([name] not taking: Reported on 11/08/2014) 30 tablet 0  . tamsulosin (FLOMAX) 0.4 MG CAPS capsule     . VOLTAREN 1 % GEL Apply 2 g topically 3 (three) times a week. Used on ankle     No current facility-administered medications for this visit.    SURGICAL HISTORY:  Past Surgical History  Procedure Laterality Date  . Lumbar laminectomy  1983  . Knee arthrsoscopy  2003    left  . Tonsillectomy  1962  . Eus N/A 04/07/2013    Procedure: UPPER ENDOSCOPIC ULTRASOUND (EUS) LINEAR;  Surgeon: [name], MD;  Location: [facility];  Service: Endoscopy;  Laterality: N/A;  . Video assisted thoracoscopy (vats)/decortication Right 06/22/2014    Procedure: VIDEO ASSISTED THORACOSCOPY (VATS)/DECORTICATION;  Surgeon: [name], MD;  Location: [facility];  Service: Thoracic;  Laterality: Right;  . Pleural effusion drainage Right 06/22/2014    Procedure: DRAINAGE OF PLEURAL EFFUSION;  Surgeon: [name], MD;  Location: [facility];  Service: Thoracic;  Laterality: Right;  . Eus N/A 07/20/2014    Procedure: UPPER ENDOSCOPIC ULTRASOUND (EUS) LINEAR;  Surgeon: [name], MD;  Location: [facility];  Service: Endoscopy;  Laterality: N/A;    REVIEW OF SYSTEMS:  Constitutional: negative Eyes: negative Ears, nose, mouth, throat, and face: negative Respiratory: negative Cardiovascular: negative Gastrointestinal: negative Genitourinary:negative Integument/breast: negative Hematologic/lymphatic: negative Musculoskeletal:negative Neurological: negative Behavioral/Psych: negative Endocrine: negative Allergic/Immunologic: negative   PHYSICAL EXAMINATION: General appearance: alert, cooperative and no distress Head: Normocephalic, without obvious abnormality, atraumatic Neck: no adenopathy, no JVD,  supple, symmetrical, trachea midline and thyroid not enlarged, symmetric, no tenderness/mass/nodules Lymph nodes: Cervical, supraclavicular, and axillary nodes normal. Resp: clear to auscultation bilaterally Back: symmetric, no curvature. ROM normal. No CVA tenderness. Cardio: regular rate and rhythm, S1, S2 normal, no murmur, click, rub or gallop GI: soft, non-tender; bowel sounds normal; no masses,  no organomegaly Extremities: extremities normal, atraumatic, no cyanosis or edema Neurologic: Alert and oriented X 3, normal strength and tone. Normal symmetric reflexes. Normal coordination and gait  ECOG PERFORMANCE STATUS: 1 - Symptomatic but completely ambulatory  Blood pressure 163/92, pulse 72, temperature 98.6 F (37 C), temperature source Oral, resp. rate 18, height 5' 11"  (1.803 m), weight 153 lb 11.2 oz (69.718 kg), SpO2 100 %.  LABORATORY DATA: Lab Results  Component Value Date   WBC 4.2 12/20/2014   HGB 10.0* 12/20/2014   HCT 29.4* 12/20/2014   MCV 109.1* 12/20/2014   PLT 256 12/20/2014      Chemistry      Component Value Date/Time   NA 142 12/13/2014 0919   NA 140 06/24/2014 0346   K 3.4* 12/13/2014 0919   K 3.3* 06/24/2014 0346   CL 102 06/24/2014 0346  CO2 26 12/13/2014 0919   CO2 28 06/24/2014 0346   BUN 14.1 12/13/2014 0919   BUN 11 06/24/2014 0346   CREATININE 0.8 12/13/2014 0919   CREATININE 0.75 06/24/2014 0346      Component Value Date/Time   CALCIUM 8.8 12/13/2014 0919   CALCIUM 8.6 06/24/2014 0346   ALKPHOS 71 12/13/2014 0919   ALKPHOS 54 06/24/2014 0346   AST 22 12/13/2014 0919   AST 13 06/24/2014 0346   ALT 15 12/13/2014 0919   ALT 11 06/24/2014 0346   BILITOT 0.90 12/13/2014 0919   BILITOT 0.6 06/24/2014 0346       RADIOGRAPHIC STUDIES: Ct Chest W Contrast  12/20/2014   CLINICAL DATA:  Restaging lung cancer  EXAM: CT CHEST, ABDOMEN, AND PELVIS WITH CONTRAST  TECHNIQUE: Multidetector CT imaging of Henry chest, abdomen and pelvis was  performed following Henry standard protocol during bolus administration of intravenous contrast.  CONTRAST:  184m OMNIPAQUE IOHEXOL 300 MG/ML  SOLN  COMPARISON:  10/18/2014  FINDINGS: CT CHEST FINDINGS  Chest wall: Stable scattered bilateral axillary lymph nodes. No supraclavicular mass or adenopathy. Henry thyroid gland is grossly normal. Henry bony thorax is intact. No destructive bone lesions or spinal canal compromise. There is however an enlarging sclerotic lesion involving Henry L1 vertebral body worrisome for metastatic disease. Stable endplate reactive changes at L4-5.  Mediastinum: Stable mediastinal and hilar lymph nodes. Henry heart is normal in size. No pericardial effusion. Mid esophageal wall thickening may be due to radiation change. Henry aorta is normal in caliber. No dissection.  Lungs/pleura: Stable loculated right-sided hydro pneumothorax and chronic small pleural effusion with talc pleurodesis.  Interval decrease in size of Henry left upper lobe pulmonary nodule. It previously measured 12.5 x 9.5 mm and now measures 12.0 x 8.5 mm.  Henry right middle lobe densities are smaller. Henry more lateral lesion measures 33 x 11.5 mm and previously measured 38 x 11.5 mm. Henry more posterior lesion measures 18.5 x 14 mm and previously measured 19 x 12.5 mm  Stable dense airspace consolidation in Henry right lower lobe.  Henry left lower lobe pulmonary lesion measures 8.5 x 5.5 mm and previously measured 9.5 x 6.5 mm. No new lesions.  CT ABDOMEN AND PELVIS FINDINGS  Hepatobiliary: Stable hepatic cysts. No new hepatic lesions or intrahepatic biliary dilatation. Henry gallbladder is normal. No common bile duct dilatation.  Pancreas: Stable pancreatic atrophy. No mass or acute inflammation. 10 mm cystic area noted in Henry pancreatic head. Recommend continued observation.  Spleen:  Normal size.  No focal lesions.  Adrenal/urinary tract: Stable left adrenal gland lesion. Henry kidneys are unremarkable.  Stomach/bowel: Henry stomach,  duodenum, small bowel and colon are grossly normal. No inflammatory changes, mass lesions or obstructive findings.  Vascular/lymphatic: No mesenteric or retroperitoneal mass or adenopathy. Scattered lymph nodes are stable. Henry aorta is normal in caliber. Henry branch vessels are patent. 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 Henry portal vein is patent.  Pelvis: Mild diffuse bladder wall thickening and prostate gland enlargement. No pelvic mass or lymphadenopathy. No inguinal mass or adenopathy. Small volume abdominal/pelvic ascites.  Musculoskeletal: No pelvic bone lesions are identified. Stable degenerative changes involving Henry spine and hips.  IMPRESSION: 1. Chronic right-sided hydro pneumothorax, pleural thickening, pleural effusion and calcification. 2. Stable small mediastinal and hilar lymph nodes. 3. Slightly Smaller pulmonary lesions.  No new lesions. 4. 10 mm cystic area suspected  in Henry pancreatic head. Recommend continued observation. 5. Stable hepatic cysts. No findings for hepatic metastatic disease. 6. Stable left adrenal gland lesion. 7. 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 8. Enlarging L1 sclerotic lesion worrisome for metastasis.   Electronically Signed   By: Kalman Jewels M.D.   On: 12/20/2014 12:08   Ct Abdomen Pelvis W Contrast  12/20/2014   CLINICAL DATA:  Restaging lung cancer  EXAM: CT CHEST, ABDOMEN, AND PELVIS WITH CONTRAST  TECHNIQUE: Multidetector CT imaging of Henry chest, abdomen and pelvis was performed following Henry standard protocol during bolus administration of intravenous contrast.  CONTRAST:  127m OMNIPAQUE IOHEXOL 300 MG/ML  SOLN  COMPARISON:  10/18/2014  FINDINGS: CT CHEST FINDINGS  Chest wall: Stable scattered bilateral axillary lymph nodes. No supraclavicular mass or adenopathy. Henry thyroid gland is  grossly normal. Henry bony thorax is intact. No destructive bone lesions or spinal canal compromise. There is however an enlarging sclerotic lesion involving Henry L1 vertebral body worrisome for metastatic disease. Stable endplate reactive changes at L4-5.  Mediastinum: Stable mediastinal and hilar lymph nodes. Henry heart is normal in size. No pericardial effusion. Mid esophageal wall thickening may be due to radiation change. Henry aorta is normal in caliber. No dissection.  Lungs/pleura: Stable loculated right-sided hydro pneumothorax and chronic small pleural effusion with talc pleurodesis.  Interval decrease in size of Henry left upper lobe pulmonary nodule. It previously measured 12.5 x 9.5 mm and now measures 12.0 x 8.5 mm.  Henry right middle lobe densities are smaller. Henry more lateral lesion measures 33 x 11.5 mm and previously measured 38 x 11.5 mm. Henry more posterior lesion measures 18.5 x 14 mm and previously measured 19 x 12.5 mm  Stable dense airspace consolidation in Henry right lower lobe.  Henry left lower lobe pulmonary lesion measures 8.5 x 5.5 mm and previously measured 9.5 x 6.5 mm. No new lesions.  CT ABDOMEN AND PELVIS FINDINGS  Hepatobiliary: Stable hepatic cysts. No new hepatic lesions or intrahepatic biliary dilatation. Henry gallbladder is normal. No common bile duct dilatation.  Pancreas: Stable pancreatic atrophy. No mass or acute inflammation. 10 mm cystic area noted in Henry pancreatic head. Recommend continued observation.  Spleen:  Normal size.  No focal lesions.  Adrenal/urinary tract: Stable left adrenal gland lesion. Henry kidneys are unremarkable.  Stomach/bowel: Henry stomach, duodenum, small bowel and colon are grossly normal. No inflammatory changes, mass lesions or obstructive findings.  Vascular/lymphatic: No mesenteric or retroperitoneal mass or adenopathy. Scattered lymph nodes are stable. Henry aorta is normal in caliber. Henry branch vessels are patent. 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 Henry portal vein is patent.  Pelvis: Mild diffuse bladder wall thickening and prostate gland enlargement. No pelvic mass or lymphadenopathy. No inguinal mass or adenopathy. Small volume abdominal/pelvic ascites.  Musculoskeletal: No pelvic bone lesions are identified. Stable degenerative changes involving Henry spine and hips.  IMPRESSION: 1. Chronic right-sided hydro pneumothorax, pleural thickening, pleural effusion and calcification. 2. Stable small mediastinal and hilar lymph nodes. 3. Slightly Smaller pulmonary lesions.  No new lesions. 4. 10 mm cystic area suspected in Henry pancreatic head. Recommend continued observation. 5. Stable hepatic cysts. No findings for hepatic metastatic disease. 6. Stable left adrenal gland lesion. 7. 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 8. Enlarging L1 sclerotic lesion worrisome for metastasis.   Electronically  Signed   By: Kalman Jewels M.D.   On: 12/20/2014 12:08   ASSESSMENT AND PLAN: This is a very pleasant 64 years old white male with metastatic adenocarcinoma of unknown primary questionable for upper gastrointestinal, pancreatic or biliary or primary lung cancer. Henry [name] is currently on systemic chemotherapy with carboplatin and Alimta status post 6 cycles and tolerating his treatment fairly well. His recent CT scan of Henry chest, abdomen and pelvis showed stable disease with mild improvement in some of Henry bilateral pulmonary nodules. I discussed Henry scan results with Henry [name] today. I gave him Henry option of observation and close monitoring versus proceeding with maintenance chemotherapy with single agent Alimta 500 MG/M2 every 3 weeks. I discussed with Henry [name] adverse effect of this treatment including but not limited to mild alopecia, nausea and vomiting, myelosuppression, liver or renal  dysfunction. Henry [name] is very familiar with Henry adverse effect of this treatment and Henry [name] would like to proceed with treatment as planned. Henry [name] is scheduled to receive Henry first cycle of this treatment today. Henry [name] would come back for follow-up visit in 3 weeks with Henry next cycle of his chemotherapy. For Henry recurrent oral thrush, Henry [name] was given prescription for Diflucan 100 mg by mouth daily for 10 days. Henry [name] was advised to call immediately if Henry [name] has any concerning symptoms in Henry interval. Henry [name] voices understanding of current disease status and treatment options and is in agreement with Henry current care plan.  All questions were answered. Henry [name] knows to call Henry clinic with any problems, questions or concerns. We can certainly see Henry [name] much sooner if necessary.  Disclaimer: This note was dictated with voice recognition software. Similar sounding words can inadvertently be transcribed and may not be corrected upon review.

## 2014-12-20 NOTE — Patient Instructions (Signed)
Smoking Cessation, Tips for Success  If you are ready to quit smoking, congratulations! You have chosen to help yourself be healthier. Cigarettes bring nicotine, tar, carbon monoxide, and other irritants into your body. Your lungs, heart, and blood vessels will be able to work better without these poisons. There are many different ways to quit smoking. Nicotine gum, nicotine patches, a nicotine inhaler, or nicotine nasal spray can help with physical craving. Hypnosis, support groups, and medicines help break the habit of smoking.  WHAT THINGS CAN I DO TO MAKE QUITTING EASIER?   Here are some tips to help you quit for good:  · Pick a date when you will quit smoking completely. Tell all of your friends and family about your plan to quit on that date.  · Do not try to slowly cut down on the number of cigarettes you are smoking. Pick a quit date and quit smoking completely starting on that day.  · Throw away all cigarettes.    · Clean and remove all ashtrays from your home, work, and car.  · On a card, write down your reasons for quitting. Carry the card with you and read it when you get the urge to smoke.  · Cleanse your body of nicotine. Drink enough water and fluids to keep your urine clear or pale yellow. Do this after quitting to flush the nicotine from your body.  · Learn to predict your moods. Do not let a bad situation be your excuse to have a cigarette. Some situations in your life might tempt you into wanting a cigarette.  · Never have "just one" cigarette. It leads to wanting another and another. Remind yourself of your decision to quit.  · Change habits associated with smoking. If you smoked while driving or when feeling stressed, try other activities to replace smoking. Stand up when drinking your coffee. Brush your teeth after eating. Sit in a different chair when you read the paper. Avoid alcohol while trying to quit, and try to drink fewer caffeinated beverages. Alcohol and caffeine may urge you to  smoke.  · Avoid foods and drinks that can trigger a desire to smoke, such as sugary or spicy foods and alcohol.  · Ask people who smoke not to smoke around you.  · Have something planned to do right after eating or having a cup of coffee. For example, plan to take a walk or exercise.  · Try a relaxation exercise to calm you down and decrease your stress. Remember, you may be tense and nervous for the first 2 weeks after you quit, but this will pass.  · Find new activities to keep your hands busy. Play with a pen, coin, or rubber band. Doodle or draw things on paper.  · Brush your teeth right after eating. This will help cut down on the craving for the taste of tobacco after meals. You can also try mouthwash.    · Use oral substitutes in place of cigarettes. Try using lemon drops, carrots, cinnamon sticks, or chewing gum. Keep them handy so they are available when you have the urge to smoke.  · When you have the urge to smoke, try deep breathing.  · Designate your home as a nonsmoking area.  · If you are a heavy smoker, ask your health care provider about a prescription for nicotine chewing gum. It can ease your withdrawal from nicotine.  · Reward yourself. Set aside the cigarette money you save and buy yourself something nice.  · Look for   support from others. Join a support group or smoking cessation program. Ask someone at home or at work to help you with your plan to quit smoking.  · Always ask yourself, "Do I need this cigarette or is this just a reflex?" Tell yourself, "Today, I choose not to smoke," or "I do not want to smoke." You are reminding yourself of your decision to quit.  · Do not replace cigarette smoking with electronic cigarettes (commonly called e-cigarettes). The safety of e-cigarettes is unknown, and some may contain harmful chemicals.  · If you relapse, do not give up! Plan ahead and think about what you will do the next time you get the urge to smoke.  HOW WILL I FEEL WHEN I QUIT SMOKING?  You  may have symptoms of withdrawal because your body is used to nicotine (the addictive substance in cigarettes). You may crave cigarettes, be irritable, feel very hungry, cough often, get headaches, or have difficulty concentrating. The withdrawal symptoms are only temporary. They are strongest when you first quit but will go away within 10-14 days. When withdrawal symptoms occur, stay in control. Think about your reasons for quitting. Remind yourself that these are signs that your body is healing and getting used to being without cigarettes. Remember that withdrawal symptoms are easier to treat than the major diseases that smoking can cause.   Even after the withdrawal is over, expect periodic urges to smoke. However, these cravings are generally short lived and will go away whether you smoke or not. Do not smoke!  WHAT RESOURCES ARE AVAILABLE TO HELP ME QUIT SMOKING?  Your health care provider can direct you to community resources or hospitals for support, which may include:  · Group support.  · Education.  · Hypnosis.  · Therapy.  Document Released: 08/01/2004 Document Revised: 03/20/2014 Document Reviewed: 04/21/2013  [employer]® Patient Information ©2015 [employer]. This information is not intended to replace advice given to you by your health care provider. Make sure you discuss any questions you have with your health care provider.

## 2014-12-20 NOTE — Patient Instructions (Signed)
[facility] Discharge Instructions for Patients Receiving Chemotherapy  Today you received the following chemotherapy agents Alimata  To help prevent nausea and vomiting after your treatment, we encourage you to take your nausea medication as directed/prescribed   If you develop nausea and vomiting that is not controlled by your nausea medication, call the clinic.   BELOW ARE SYMPTOMS THAT SHOULD BE REPORTED IMMEDIATELY:  *FEVER GREATER THAN 100.5 F  *CHILLS WITH OR WITHOUT FEVER  NAUSEA AND VOMITING THAT IS NOT CONTROLLED WITH YOUR NAUSEA MEDICATION  *UNUSUAL SHORTNESS OF BREATH  *UNUSUAL BRUISING OR BLEEDING  TENDERNESS IN MOUTH AND THROAT WITH OR WITHOUT PRESENCE OF ULCERS  *URINARY PROBLEMS  *BOWEL PROBLEMS  UNUSUAL RASH Items with * indicate a potential emergency and should be followed up as soon as possible.  Feel free to call the clinic you have any questions or concerns. The clinic phone number is [phone].

## 2014-12-21 LAB — FERRITIN CHCC: FERRITIN: 213 ng/mL (ref 22–316)

## 2014-12-22 ENCOUNTER — Ambulatory Visit (HOSPITAL_COMMUNITY): Payer: BLUE CROSS/BLUE SHIELD

## 2014-12-28 ENCOUNTER — Telehealth: Payer: Self-pay | Admitting: *Deleted

## 2014-12-28 NOTE — Telephone Encounter (Signed)
Pt called stating that [employer] is still denying his molecular studies.  Gave information to Gaspar Bidding to see what needs to be done to help get his studies covered by insurance.  Pt is aware managed care was contacted

## 2015-01-10 ENCOUNTER — Telehealth: Payer: Self-pay | Admitting: Internal Medicine

## 2015-01-10 ENCOUNTER — Ambulatory Visit (HOSPITAL_BASED_OUTPATIENT_CLINIC_OR_DEPARTMENT_OTHER): Payer: BLUE CROSS/BLUE SHIELD

## 2015-01-10 ENCOUNTER — Ambulatory Visit (HOSPITAL_BASED_OUTPATIENT_CLINIC_OR_DEPARTMENT_OTHER): Payer: BLUE CROSS/BLUE SHIELD | Admitting: Nurse Practitioner

## 2015-01-10 ENCOUNTER — Other Ambulatory Visit (HOSPITAL_BASED_OUTPATIENT_CLINIC_OR_DEPARTMENT_OTHER): Payer: BLUE CROSS/BLUE SHIELD

## 2015-01-10 VITALS — BP 164/94 | HR 78 | Temp 98.3°F | Resp 19 | Ht 71.0 in | Wt 155.0 lb

## 2015-01-10 DIAGNOSIS — C801 Malignant (primary) neoplasm, unspecified: Secondary | ICD-10-CM

## 2015-01-10 DIAGNOSIS — C3490 Malignant neoplasm of unspecified part of unspecified bronchus or lung: Secondary | ICD-10-CM

## 2015-01-10 DIAGNOSIS — C3491 Malignant neoplasm of unspecified part of right bronchus or lung: Secondary | ICD-10-CM

## 2015-01-10 DIAGNOSIS — Z5111 Encounter for antineoplastic chemotherapy: Secondary | ICD-10-CM

## 2015-01-10 LAB — CBC WITH DIFFERENTIAL/PLATELET
BASO%: 0.5 % (ref 0.0–2.0)
BASOS ABS: 0 10*3/uL (ref 0.0–0.1)
EOS ABS: 0.3 10*3/uL (ref 0.0–0.5)
EOS%: 4.4 % (ref 0.0–7.0)
HCT: 35.5 % — ABNORMAL LOW (ref 38.4–49.9)
HGB: 12.3 g/dL — ABNORMAL LOW (ref 13.0–17.1)
LYMPH%: 22.3 % (ref 14.0–49.0)
MCH: 38.3 pg — ABNORMAL HIGH (ref 27.2–33.4)
MCHC: 34.6 g/dL (ref 32.0–36.0)
MCV: 110.6 fL — ABNORMAL HIGH (ref 79.3–98.0)
MONO#: 0.8 10*3/uL (ref 0.1–0.9)
MONO%: 13.8 % (ref 0.0–14.0)
NEUT%: 59 % (ref 39.0–75.0)
NEUTROS ABS: 3.5 10*3/uL (ref 1.5–6.5)
PLATELETS: 287 10*3/uL (ref 140–400)
RBC: 3.21 10*6/uL — ABNORMAL LOW (ref 4.20–5.82)
RDW: 19.6 % — ABNORMAL HIGH (ref 11.0–14.6)
WBC: 5.9 10*3/uL (ref 4.0–10.3)
lymph#: 1.3 10*3/uL (ref 0.9–3.3)

## 2015-01-10 LAB — COMPREHENSIVE METABOLIC PANEL (CC13)
ALBUMIN: 3.8 g/dL (ref 3.5–5.0)
ALT: 16 U/L (ref 0–55)
AST: 24 U/L (ref 5–34)
Alkaline Phosphatase: 88 U/L (ref 40–150)
Anion Gap: 8 mEq/L (ref 3–11)
BUN: 11 mg/dL (ref 7.0–26.0)
CALCIUM: 8.7 mg/dL (ref 8.4–10.4)
CHLORIDE: 112 meq/L — AB (ref 98–109)
CO2: 25 mEq/L (ref 22–29)
CREATININE: 1 mg/dL (ref 0.7–1.3)
EGFR: 84 mL/min/{1.73_m2} — ABNORMAL LOW (ref >90)
Glucose: 108 mg/dl (ref 70–140)
POTASSIUM: 3.4 meq/L — AB (ref 3.5–5.1)
Sodium: 145 mEq/L (ref 136–145)
Total Bilirubin: 0.97 mg/dL (ref 0.20–1.20)
Total Protein: 6.5 g/dL (ref 6.4–8.3)

## 2015-01-10 MED ORDER — ONDANSETRON 8 MG/NS 50 ML IVPB
INTRAVENOUS | Status: AC
  Filled 2015-01-10: qty 8

## 2015-01-10 MED ORDER — SODIUM CHLORIDE 0.9 % IV SOLN
485.0000 mg/m2 | Freq: Once | INTRAVENOUS | Status: AC
  Administered 2015-01-10: 900 mg via INTRAVENOUS
  Filled 2015-01-10: qty 36

## 2015-01-10 MED ORDER — ONDANSETRON 8 MG/50ML IVPB (CHCC)
8.0000 mg | Freq: Once | INTRAVENOUS | Status: AC
  Administered 2015-01-10: 8 mg via INTRAVENOUS

## 2015-01-10 MED ORDER — DEXAMETHASONE SODIUM PHOSPHATE 10 MG/ML IJ SOLN
10.0000 mg | Freq: Once | INTRAMUSCULAR | Status: AC
  Administered 2015-01-10: 10 mg via INTRAVENOUS

## 2015-01-10 MED ORDER — SODIUM CHLORIDE 0.9 % IV SOLN
Freq: Once | INTRAVENOUS | Status: AC
  Administered 2015-01-10: 12:00:00 via INTRAVENOUS

## 2015-01-10 MED ORDER — DEXAMETHASONE SODIUM PHOSPHATE 10 MG/ML IJ SOLN
INTRAMUSCULAR | Status: AC
  Filled 2015-01-10: qty 1

## 2015-01-10 NOTE — Patient Instructions (Signed)
[facility] Discharge Instructions for Patients Receiving Chemotherapy  Today you received the following chemotherapy agents Alimta.  To help prevent nausea and vomiting after your treatment, we encourage you to take your nausea medication as directed.    If you develop nausea and vomiting that is not controlled by your nausea medication, call the clinic.   BELOW ARE SYMPTOMS THAT SHOULD BE REPORTED IMMEDIATELY:  *FEVER GREATER THAN 100.5 F  *CHILLS WITH OR WITHOUT FEVER  NAUSEA AND VOMITING THAT IS NOT CONTROLLED WITH YOUR NAUSEA MEDICATION  *UNUSUAL SHORTNESS OF BREATH  *UNUSUAL BRUISING OR BLEEDING  TENDERNESS IN MOUTH AND THROAT WITH OR WITHOUT PRESENCE OF ULCERS  *URINARY PROBLEMS  *BOWEL PROBLEMS  UNUSUAL RASH Items with * indicate a potential emergency and should be followed up as soon as possible.  Feel free to call the clinic you have any questions or concerns. The clinic phone number is [phone].

## 2015-01-10 NOTE — Telephone Encounter (Signed)
s.w. pt and advised on March appt....pt ok and aware °

## 2015-01-10 NOTE — Progress Notes (Signed)
  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   INTERVAL HISTORY:   Henry [name] returns as scheduled. He began maintenance chemotherapy with single agent Alimta 12/20/2014. He denies nausea/vomiting. No mouth sores. He has chronic diarrhea estimating 3-5 bowel movements a day. This has been occurring for the past year and a half. No rash. Stable dyspnea on exertion. He reports a chronic cough. No fever.  Objective:  Vital signs in last 24 hours:  Blood pressure 164/94, pulse 78, temperature 98.3 F (36.8 C), temperature source Oral, resp. rate 19, height 5\' 11"  (1.803 m), weight 155 lb (70.308 kg), SpO2 100 %.    HEENT: No thrush or ulcers. Resp: Breath sounds diminished right lower lung field. No respiratory distress. Faint wheezes anteriorly. Cardio: Regular rate and rhythm. GI: Abdomen soft and nontender. No hepatomegaly. Vascular: Trace lower leg edema bilaterally. Calves soft and nontender. Neuro: Alert and oriented. Gait normal.   Lab Results:  Lab Results  Component Value Date   WBC 5.9 01/10/2015   HGB 12.3* 01/10/2015   HCT 35.5* 01/10/2015   MCV 110.6* 01/10/2015   PLT 287 01/10/2015   NEUTROABS 3.5 01/10/2015    Imaging:  No results found.  Medications: I have reviewed the patient's current medications.  Assessment/Plan: 1. Metastatic adenocarcinoma of unknown primary question upper gastrointestinal, pancreatic or biliary or primary lung cancer. Status post carboplatin/Alimta 6 cycles 08/02/2014 thorugh 11/29/2014. Stable disease after cycle 6. Initiation of maintenance Alimta 12/20/2014.   Disposition: Henry [name] appears stable. Plan to proceed with cycle 2 maintenance Alimta today as scheduled. He will return for a follow-up visit and the third cycle in 3 weeks. The plan is for a  restaging CT evaluation after 3 cycles. He will contact the office prior to his next visit with any problems.  Plan reviewed with Dr. Julien Nordmann.    Ned Card ANP/GNP-BC   01/10/2015  11:31 AM

## 2015-01-24 ENCOUNTER — Other Ambulatory Visit: Payer: BLUE CROSS/BLUE SHIELD

## 2015-01-26 ENCOUNTER — Telehealth: Payer: Self-pay | Admitting: Internal Medicine

## 2015-01-26 NOTE — Telephone Encounter (Signed)
Moved 3/15 f/u from LT to GD. S/w pt re change with new appt start time for 9:45am.

## 2015-01-30 ENCOUNTER — Other Ambulatory Visit (HOSPITAL_BASED_OUTPATIENT_CLINIC_OR_DEPARTMENT_OTHER): Payer: BLUE CROSS/BLUE SHIELD

## 2015-01-30 ENCOUNTER — Encounter: Payer: Self-pay | Admitting: *Deleted

## 2015-01-30 ENCOUNTER — Ambulatory Visit (HOSPITAL_BASED_OUTPATIENT_CLINIC_OR_DEPARTMENT_OTHER): Payer: BLUE CROSS/BLUE SHIELD | Admitting: Adult Health

## 2015-01-30 ENCOUNTER — Telehealth: Payer: Self-pay | Admitting: Adult Health

## 2015-01-30 ENCOUNTER — Encounter: Payer: Self-pay | Admitting: Adult Health

## 2015-01-30 VITALS — BP 161/96 | HR 80 | Temp 98.4°F | Resp 19 | Ht 71.0 in | Wt 154.6 lb

## 2015-01-30 DIAGNOSIS — C801 Malignant (primary) neoplasm, unspecified: Secondary | ICD-10-CM

## 2015-01-30 DIAGNOSIS — R609 Edema, unspecified: Secondary | ICD-10-CM

## 2015-01-30 DIAGNOSIS — E876 Hypokalemia: Secondary | ICD-10-CM

## 2015-01-30 DIAGNOSIS — C3490 Malignant neoplasm of unspecified part of unspecified bronchus or lung: Secondary | ICD-10-CM

## 2015-01-30 LAB — COMPREHENSIVE METABOLIC PANEL (CC13)
ALT: 22 U/L (ref 0–55)
ANION GAP: 13 meq/L — AB (ref 3–11)
AST: 28 U/L (ref 5–34)
Albumin: 3.9 g/dL (ref 3.5–5.0)
Alkaline Phosphatase: 78 U/L (ref 40–150)
BUN: 16.5 mg/dL (ref 7.0–26.0)
CHLORIDE: 106 meq/L (ref 98–109)
CO2: 28 mEq/L (ref 22–29)
Calcium: 9 mg/dL (ref 8.4–10.4)
Creatinine: 0.9 mg/dL (ref 0.7–1.3)
EGFR: 88 mL/min/{1.73_m2} — AB (ref >90)
Glucose: 113 mg/dl (ref 70–140)
Potassium: 3.5 mEq/L (ref 3.5–5.1)
Sodium: 147 mEq/L — ABNORMAL HIGH (ref 136–145)
Total Bilirubin: 0.73 mg/dL (ref 0.20–1.20)
Total Protein: 6.7 g/dL (ref 6.4–8.3)

## 2015-01-30 LAB — CBC WITH DIFFERENTIAL/PLATELET
BASO%: 0.2 % (ref 0.0–2.0)
BASOS ABS: 0 10*3/uL (ref 0.0–0.1)
EOS ABS: 0.4 10*3/uL (ref 0.0–0.5)
EOS%: 6.7 % (ref 0.0–7.0)
HCT: 36.1 % — ABNORMAL LOW (ref 38.4–49.9)
HGB: 12.5 g/dL — ABNORMAL LOW (ref 13.0–17.1)
LYMPH%: 23.7 % (ref 14.0–49.0)
MCH: 38.2 pg — ABNORMAL HIGH (ref 27.2–33.4)
MCHC: 34.6 g/dL (ref 32.0–36.0)
MCV: 110.4 fL — ABNORMAL HIGH (ref 79.3–98.0)
MONO#: 1.1 10*3/uL — AB (ref 0.1–0.9)
MONO%: 18.7 % — ABNORMAL HIGH (ref 0.0–14.0)
NEUT%: 50.7 % (ref 39.0–75.0)
NEUTROS ABS: 2.9 10*3/uL (ref 1.5–6.5)
Platelets: 266 10*3/uL (ref 140–400)
RBC: 3.27 10*6/uL — AB (ref 4.20–5.82)
RDW: 17.9 % — ABNORMAL HIGH (ref 11.0–14.6)
WBC: 5.8 10*3/uL (ref 4.0–10.3)
lymph#: 1.4 10*3/uL (ref 0.9–3.3)

## 2015-01-30 MED ORDER — POTASSIUM CHLORIDE CRYS ER 20 MEQ PO TBCR: 40.0000 meq | EXTENDED_RELEASE_TABLET | Freq: Every day | ORAL | Status: DC

## 2015-01-30 MED ORDER — POTASSIUM CHLORIDE CRYS ER 20 MEQ PO TBCR: 20.0000 meq | EXTENDED_RELEASE_TABLET | Freq: Two times a day (BID) | ORAL | Status: DC

## 2015-01-30 MED ORDER — FUROSEMIDE 20 MG PO TABS: 20.0000 mg | ORAL_TABLET | Freq: Every day | ORAL | Status: DC

## 2015-01-30 MED ORDER — POTASSIUM CHLORIDE CRYS ER 20 MEQ PO TBCR: 40.0000 meq | EXTENDED_RELEASE_TABLET | Freq: Two times a day (BID) | ORAL | Status: DC

## 2015-01-30 NOTE — Telephone Encounter (Signed)
per pof ot sch pt appt-sent email to [facility] to adv time not avail per pof-adv to send time & date for appt-

## 2015-01-30 NOTE — Progress Notes (Signed)
OFFICE VISIT PROGRESS NOTE  CLINIC:  Medical Oncology Dr. Curt Bears  REASON FOR VISIT:  Pre-treatment visit for maintenance chemotherapy with single agent Alimta, every 3 weeks.  First dose 12/20/2014. Tomorrow will be Day 1, Cycle 3 of maintenance chemotherapy with single agent Alimta 500 mg/m2.     DIAGNOSIS:  Metastatic adenocarcinoma of unknown primary but questionable for upper gastrointestinal, pancreaticobiliary or primary lung cancer, diagnosed in August 2015.  Genetic Mutations Identified: FGFR2 A97T KRAS Q61H - subclonal ARID1A [id number]* - subclonal  ONCOLOGIC HISTORY:  1)  06/23/2014: Right video-assisted thoracoscopy drainage of pleural effusion, pleural biopsy, partial decortication, and talc pleurodesis under the care of Dr. Roxan Hockey.  2)  08/02/2014-11/19/2014: Systemic chemotherapy with carboplatin (AUC 5) and Alimta 500 mg/m2 every 3 weeks.  Completed a total of 6 cycles of this regimen.   3)  12/20/2014: CT scan of chest/abdomen/pelvis upon completion of chemotherapy revealed grossly stable disease, with mild improvement in some of the bilateral pulmonary nodules.   INTERVAL HISTORY:  Henry [name] is a very pleasant 64 year old gentleman from [place], [place] with metastatic carcinoma of unknown primary, here for consideration for cycle #3 of single-agent maintenance chemotherapy with Alimta due tomorrow, 01/31/2015.  Henry [name] biggest complaints today are fatigue and lower extremity edema.  He reports that the edema has gotten worse over the last 6-9 weeks or so.  He states that he had to loosen the laces on his shoes and often has to wear bedroom shoes around the house due to the pedal edema. He continues to have chronic dyspnea on exertion, particularly with strenuous activity, as well as a chronic productive cough. He denies any  worsening shortness of breath when trying to lie flat at night, no increased number of pillows to sleep, no chest pain, and no dyspnea at rest.   Generalized fatigue continues to be a concern for Henry [name], and he expressed frustration that he was not having more energy on the Alimta alone, but he understands that he is likely tired as a result of the chemotherapy. He is still able to complete his activities of daily living independently, but he is limited by strenuous activities or heavy lifting due to the dyspnea.  He sometimes finds this challenging, [place]  He also reports recently injuring his right index finger near the nail bed while working in his wood shop about 1 week ago.  He reports clear drainage from the finger, but no redness, purulent drainage, or foul odor.  He also had a burn on an iron skillet to his right palm, but this is scabbed and healing. He denies fever or chills.  He denies any mouth sores or oral thrush. His bowel movements are grossly unchanged from previous, with 3-5 stools per day.    REVIEW OF SYSTEMS:  General: Denies fever, chills. Endorses generalized fatigue.  Skin: Denies rash. Endorses recent injury to right index finger and right palm of hand. HEENT: Denies mouth sores, thrush.  Lungs: Endorses chronic cough and dyspnea on exertion, grossly unchanged from previous.  Cardiovascular: Denies chest pain or palpitations. Endorses lower extremity edema.  GI: Endorses continued chronic loose stools (3-5 stools/day) which is stable and largely unchanged. Endorses occasional minor bleeding with bowel movements, which pt attributes to a hemorrhoid. Denies nausea or vomiting.  GU: Denies dysuria, urinary incontinence, or hematuria. Musculoskeletal: Denies myalgia, arthralgia.  Psych: Reports that his "spirits" have been good, as he has good support from his son (who lives with him), sister (who lives locally), and other family.  Expressed concern  about having to move his parents into an assisted living facility soon, as his mom is 55 and his dad is 73 years old. He feels that he is coping well and has adequate support.   A 14-point review of systems was completed and is otherwise negative.    MEDICAL HISTORY:  Past Medical History  Diagnosis Date  . Hypertension   . GERD (gastroesophageal reflux disease)   . Hemochromatosis   . Hx of adenomatous colonic polyps   . Umbilical hernia     none since weight loss  . Diverticulosis   . Lumbar spondylosis     ankle  . Pleural effusion on right 12/16/13    per chest xray  . Shortness of breath     with  exertion  . Chronic pancreatitis   . IBS (irritable bowel syndrome)   . Lung cancer 06/2014  . 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 Lumbar laminectomy  1983  . Knee arthrsoscopy  2003    left  . Tonsillectomy  1962  . Eus N/A 04/07/2013    Procedure: UPPER ENDOSCOPIC ULTRASOUND (EUS) LINEAR;  Surgeon: [name], MD;  Location: [facility];  Service: Endoscopy;  Laterality: N/A;  . Video assisted thoracoscopy (vats)/decortication Right 06/22/2014    Procedure: VIDEO ASSISTED THORACOSCOPY (VATS)/DECORTICATION;  Surgeon: [name], MD;  Location: [facility];  Service: Thoracic;  Laterality: Right;  . Pleural effusion drainage Right 06/22/2014    Procedure: DRAINAGE OF PLEURAL EFFUSION;  Surgeon: [name], MD;  Location: [facility];  Service: Thoracic;  Laterality: Right;  . Eus N/A 07/20/2014    Procedure: UPPER ENDOSCOPIC ULTRASOUND (EUS) LINEAR;  Surgeon: [name], MD;  Location: [facility];  Service: Endoscopy;  Laterality: N/A;     ALLERGIES:  No known allergies.    MEDICATIONS:  Outpatient Encounter Prescriptions as of 01/30/2015  Medication Sig Note  . albuterol (PROVENTIL HFA;VENTOLIN HFA) 108 (90 BASE) MCG/ACT inhaler Inhale 1-2 puffs into the lungs every 4 (four) hours as needed for wheezing  or shortness of breath.  07/05/2014: .  . dexamethasone (DECADRON) 4 MG tablet 4 mg by mouth twice a day the day before, day of and day after the chemotherapy every 3 weeks.   Marland Kitchen diltiazem (CARDIZEM CD) 360 MG 24 hr capsule Take 360 mg by mouth every morning.  11/29/2014: Pt stopped it "It was causing me to lose weight"  . folic acid (FOLVITE) 1 MG tablet Take 1 tablet (1 mg total) by mouth daily.   . furosemide (  LASIX) 20 MG tablet Take 1 tablet (20 mg total) by mouth daily.   . hydrochlorothiazide (HYDRODIURIL) 25 MG tablet Take 25 mg by mouth every evening.  11/29/2014: Pt stopped it  . ibuprofen (ADVIL,MOTRIN) 200 MG tablet Take 400 mg by mouth every 6 (six) hours as needed (Pain).   . nebivolol (BYSTOLIC) 5 MG tablet Take 5 mg by mouth every evening.  01/10/2015: taking  . oxycodone (OXY-IR) 5 MG capsule Take 1 capsule (5 mg total) by mouth every 4 (four) hours as needed. (Patient not taking: Reported on 01/10/2015) 11/29/2014: "Rarely take it "  . Pancrelipase, Lip-Prot-Amyl, 20880 UNITS TABS Take 2 tablets by mouth 3 (three) times daily with meals. 01/10/2015: taking  . potassium chloride SA (K-DUR,KLOR-CON) 20 MEQ tablet Take 2 tablets (40 mEq total) by mouth daily.   . Probiotic Product (ALIGN) 4 MG CAPS Take 4 mg by mouth daily.  11/29/2014: Pt stopped it  . prochlorperazine (COMPAZINE) 10 MG tablet Take 1 tablet (10 mg total) by mouth every 6 (six) hours as needed for nausea or vomiting. (Patient not taking: Reported on 01/10/2015) 11/29/2014: Has not needed  . tamsulosin (FLOMAX) 0.4 MG CAPS capsule  08/09/2014: Received from: External Pharmacy  . VOLTAREN 1 % GEL Apply 2 g topically 3 (three) times a week. Used on ankle 07/05/2014: .  Marland Kitchen [DISCONTINUED] potassium chloride SA (K-DUR,KLOR-CON) 20 MEQ tablet Take 1 tablet (20 mEq total) by mouth daily.   . [DISCONTINUED] potassium chloride SA (K-DUR,KLOR-CON) 20 MEQ tablet Take 1 tablet (20 mEq total) by mouth 2 (two) times daily. Take 2 tablets (40 mEq  total) by mouth 2 (two) times daily.       PHYSICAL EXAM:  Filed Vitals:   01/30/15 1028  BP: 161/96  Pulse: 80  Temp: 98.4 F (36.9 C)  Resp: 19   Filed Weights   01/30/15 1028  Weight: 154 lb 9.6 oz (70.126 kg)   General: Awake, alert, oriented, cooperative in no acute distress. Unaccompanied in clinic today.  HEENT: Head atraumatic, normocephalic. Pupils equal and reactive to light and accommodation.  Sclerae anicteric.  Conjunctivae clear, without exudate.  Oral mucous membranes without lesions. No cervical, supraclavicular, infraclavicular, or axillary adenopathy.  Respiratory: Clear to auscultation bilaterally, diminished breath sounds to right base.  No accessory muscle use.  Cardiac: Regular rate and rhythm. No murmurs, rubs, or gallops when auscultated in both seated and lying positions. No JVD noted.  GI: Non-tender, non-distended abdomen. Bowel sounds present in all 4 quadrants.  No hepatosplenomegaly.  GU: Deferred.  Skin: No rashes or lesions noted. Right index finger with peeling skin near the nail bed as the result of a recent injury; is progressing towards healing; no erythema, exudate, or evidence of infection at site.  Also on palm of right hand, there is a scab from a recent burn; this is also progressing towards healing without evidence of infection.   Musculoskeletal: Full ROM noted in all extremities.  Extremities: 1-2+ pitting pedal edema, also 1+ lower extremity pitting edema up to mid-shin.  Neuro: Grossly intact. Steady gait.  Psych: Affect and mood normal for situation.   ECOG Performance Status:  1 - Restricted in physically strenuous activity, but ambulatory and able to carry out work of a light or sedentary nature.    LABORATORY DATA:  Results for orders placed or performed in visit on 01/30/15 (from the past 24 hour(s))  CBC with Differential     Status: Abnormal   Collection Time: 01/30/15 10:06  AM  Result Value Ref Range   WBC 5.8 4.0 - 10.3  10e3/uL   NEUT# 2.9 1.5 - 6.5 10e3/uL   HGB 12.5 (L) 13.0 - 17.1 g/dL   HCT 36.1 (L) 38.4 - 49.9 %   Platelets 266 140 - 400 10e3/uL   MCV 110.4 (H) 79.3 - 98.0 fL   MCH 38.2 (H) 27.2 - 33.4 pg   MCHC 34.6 32.0 - 36.0 g/dL   RBC 3.27 (L) 4.20 - 5.82 10e6/uL   RDW 17.9 (H) 11.0 - 14.6 %   lymph# 1.4 0.9 - 3.3 10e3/uL   MONO# 1.1 (H) 0.1 - 0.9 10e3/uL   Eosinophils Absolute 0.4 0.0 - 0.5 10e3/uL   Basophils Absolute 0.0 0.0 - 0.1 10e3/uL   NEUT% 50.7 39.0 - 75.0 %   LYMPH% 23.7 14.0 - 49.0 %   MONO% 18.7 (H) 0.0 - 14.0 %   EOS% 6.7 0.0 - 7.0 %   BASO% 0.2 0.0 - 2.0 %   Narrative   Performed At:  [facility]               [address]               [place], [place] [address]  Comprehensive metabolic panel (Cmet) - [facility]     Status: Abnormal   Collection Time: 01/30/15 10:06 AM  Result Value Ref Range   Sodium 147 (H) 136 - 145 mEq/L   Potassium 3.5 3.5 - 5.1 mEq/L   Chloride 106 98 - 109 mEq/L   CO2 28 22 - 29 mEq/L   Glucose 113 70 - 140 mg/dl   BUN 16.5 7.0 - 26.0 mg/dL   Creatinine 0.9 0.7 - 1.3 mg/dL   Total Bilirubin 0.73 0.20 - 1.20 mg/dL   Alkaline Phosphatase 78 40 - 150 U/L   AST 28 5 - 34 U/L   ALT 22 0 - 55 U/L   Total Protein 6.7 6.4 - 8.3 g/dL   Albumin 3.9 3.5 - 5.0 g/dL   Calcium 9.0 8.4 - 10.4 mg/dL   Anion Gap 13 (H) 3 - 11 mEq/L   EGFR 88 (L) >90 ml/min/1.73 m2   Narrative   Performed At:  [facility] [address]               [place], [place] [address]  I reviewed these labs both personally and with the patient.  Labs are grossly within normal limits. Decreased hemoglobin and hematocrit are chronic and grossly unchanged from previous.    ASSESSMENT AND PLAN:   1. Metastatic adenocarcinoma of unknown primary:  Henry [name] will proceed with cycle #3 of Alimta tomorrow, on 01/31/15 as scheduled.  His labs and physical exam are grossly stable and unchanged from previous. He will have a staging CT scan of his  chest/abdomen/pelvis prior to his next visit here. I have placed those orders today. Henry [name] will return to the [facility] in 3 weeks for a visit with Dr. Julien Nordmann to review the results of his staging scan, as well as for further systemic treatment consideration at that time.   2. Lower extremity pitting edema:  Henry [name] reports that this edema has been getting worse over the last 6-9 weeks and likely has a multifactorial etiology.  His PCP recently stopped his Cardizem, which may help decrease some of the lower extremity edema.  I prescribed 20 mg po Lasix for him to take for the next week or so to see if this improves his edema.  I instructed him to stop taking the HCTZ while he was taking the Lasix.  He was encouraged to drink plenty of fluids throughout the day, particularly those without caffeine when able.  I will follow-up with him next week via telephone to see how he is feeling and to see if the edema is improved.   3. Hypokalemia:  His serum potassium is on the low end of normal at 3.5 today.  However, in the advent of starting low-dose Lasix today, I ordered for him to take his K-Dur 45mq BID (total of 80 mEq/day), rather than once daily, while he is taking the Lasix.  He expressed verbal understanding of this plan and his [facility] per pt request.    Henry [name] with verbal understanding of the above plan.  The plan was also reviewed with Dr. MDelene Ruffini who agrees.  Henry [name] encouraged to ask questions and all questions were answered to his satisfaction.  He was encouraged to call the clinic with any problems, questions, or concerns prior to his next visit and will be happy to see the patient sooner, if needed.    A total of 30 minutes was spent face-to-face with this patient, with greater than 50% of that time in counseling and care coordination.    GMike Craze NP [phone]

## 2015-01-30 NOTE — [facility] Oncology Navigator Note (Unsigned)
Helped patient to fill out financial assistance form for [employer].  Faxed

## 2015-01-31 ENCOUNTER — Ambulatory Visit (HOSPITAL_BASED_OUTPATIENT_CLINIC_OR_DEPARTMENT_OTHER): Payer: BLUE CROSS/BLUE SHIELD

## 2015-01-31 ENCOUNTER — Encounter: Payer: Self-pay | Admitting: Internal Medicine

## 2015-01-31 ENCOUNTER — Telehealth: Payer: Self-pay | Admitting: *Deleted

## 2015-01-31 ENCOUNTER — Other Ambulatory Visit: Payer: BLUE CROSS/BLUE SHIELD

## 2015-01-31 ENCOUNTER — Telehealth: Payer: Self-pay | Admitting: Internal Medicine

## 2015-01-31 ENCOUNTER — Ambulatory Visit: Payer: BLUE CROSS/BLUE SHIELD | Admitting: Nurse Practitioner

## 2015-01-31 ENCOUNTER — Ambulatory Visit: Payer: BLUE CROSS/BLUE SHIELD

## 2015-01-31 DIAGNOSIS — Z5111 Encounter for antineoplastic chemotherapy: Secondary | ICD-10-CM

## 2015-01-31 DIAGNOSIS — C801 Malignant (primary) neoplasm, unspecified: Secondary | ICD-10-CM

## 2015-01-31 DIAGNOSIS — C3490 Malignant neoplasm of unspecified part of unspecified bronchus or lung: Secondary | ICD-10-CM

## 2015-01-31 MED ORDER — SODIUM CHLORIDE 0.9 % IV SOLN
480.0000 mg/m2 | Freq: Once | INTRAVENOUS | Status: AC
  Administered 2015-01-31: 900 mg via INTRAVENOUS
  Filled 2015-01-31: qty 36

## 2015-01-31 MED ORDER — CYANOCOBALAMIN 1000 MCG/ML IJ SOLN
1000.0000 ug | Freq: Once | INTRAMUSCULAR | Status: AC
  Administered 2015-01-31: 1000 ug via INTRAMUSCULAR

## 2015-01-31 MED ORDER — CYANOCOBALAMIN 1000 MCG/ML IJ SOLN
INTRAMUSCULAR | Status: AC
  Filled 2015-01-31: qty 1

## 2015-01-31 MED ORDER — SODIUM CHLORIDE 0.9 % IV SOLN
Freq: Once | INTRAVENOUS | Status: AC
  Administered 2015-01-31: 13:00:00 via INTRAVENOUS

## 2015-01-31 MED ORDER — DEXAMETHASONE SODIUM PHOSPHATE 100 MG/10ML IJ SOLN
Freq: Once | INTRAMUSCULAR | Status: AC
  Administered 2015-01-31: 13:00:00 via INTRAVENOUS
  Filled 2015-01-31: qty 4

## 2015-01-31 NOTE — Telephone Encounter (Signed)
I have adjusted 4/6 appt

## 2015-01-31 NOTE — Progress Notes (Signed)
I sent email to billing for the patient in regards to bill [id number]. He said he has always had insurance. I also sent an email to [place] and packet from [facility] in regards to a coding issue. He wants a call back to advise on both issues.

## 2015-01-31 NOTE — Patient Instructions (Signed)
[facility] Discharge Instructions for Patients Receiving Chemotherapy  Today you received the following chemotherapy agents: Alimta.   To help prevent nausea and vomiting after your treatment, we encourage you to take your nausea medication as directed.    If you develop nausea and vomiting that is not controlled by your nausea medication, call the clinic.   BELOW ARE SYMPTOMS THAT SHOULD BE REPORTED IMMEDIATELY:  *FEVER GREATER THAN 100.5 F  *CHILLS WITH OR WITHOUT FEVER  NAUSEA AND VOMITING THAT IS NOT CONTROLLED WITH YOUR NAUSEA MEDICATION  *UNUSUAL SHORTNESS OF BREATH  *UNUSUAL BRUISING OR BLEEDING  TENDERNESS IN MOUTH AND THROAT WITH OR WITHOUT PRESENCE OF ULCERS  *URINARY PROBLEMS  *BOWEL PROBLEMS  UNUSUAL RASH Items with * indicate a potential emergency and should be followed up as soon as possible.  Feel free to call the clinic you have any questions or concerns. The clinic phone number is [phone].  Please show the [place] at check to the Emergency Department and triage nurse.

## 2015-01-31 NOTE — Telephone Encounter (Signed)
per reply from staff message from [place] to sch w/him on 4/6 @ 11:00

## 2015-01-31 NOTE — Telephone Encounter (Signed)
sent MW email to coordinate trmt w/MD appt-pt coming today & can get updated copy of sch

## 2015-01-31 NOTE — Telephone Encounter (Signed)
Morey Hummingbird with [facility] to say that there is an order from Mike Craze for this patient.  However, the expected date is missing and they are unable to schedule. She requested a call back at [phone].  Called her back and left her a voice mail and read from [place] note from 01-30-15 states that the CT scan is to be done prior to next visit with Dr. Julien Nordmann in 3 weeks.  Requested that she call us back if there are more questions.  Will also route this to Mike Craze for her information.

## 2015-02-02 ENCOUNTER — Encounter: Payer: Self-pay | Admitting: Internal Medicine

## 2015-02-02 NOTE — Progress Notes (Signed)
Note from billing The [employer] coverage was in Registration so I am not sure why the DOS 12/20/14 was not submitted to it.  I voided the uninsured discount and sent the claim to [facility].  I have tried to call this patient previously, on 01/12/15, and I left a message for a  callback and never received one.

## 2015-02-05 ENCOUNTER — Telehealth: Payer: Self-pay | Admitting: *Deleted

## 2015-02-05 ENCOUNTER — Encounter: Payer: Self-pay | Admitting: *Deleted

## 2015-02-05 ENCOUNTER — Telehealth: Payer: Self-pay | Admitting: Adult Health

## 2015-02-05 ENCOUNTER — Telehealth: Payer: Self-pay | Admitting: Medical Oncology

## 2015-02-05 NOTE — Telephone Encounter (Signed)
I called Mr. Barretto to check on him after seeing him in clinic last week prior to his 3rd cycle of single-agent Alimta and starting him on Lasix for lower extremity edema.   He reports that the swelling is "a little bit better" and overall he is feeling much better than he was feeling.  He says that his energy levels are much better this week than after previous cycles of chemotherapy as well.  He reports that he is not taking the HCTZ while he is taking the Lasix, as previously instructed, and is also taking the higher dose of K-Dur as ordered as well.  He denies any dizziness/lightheadedness since beginning the Lasix.  I cautioned him that we will be keeping a close look at his kidney function while he is taking Lasix, particularly with his upcoming staging CT with contrast and he is with verbal understanding to continue to increase his fluid intake as tolerated.   I gave him my direct office number and encouraged him to call with any questions or concerns before his next visit to the cancer center.  I also reviewed with him his upcoming appointments and he is with verbal understanding.  I have asked Norton Blizzard, Thoracic Nurse Navigator, to assist in following up with the patient regarding his financial assistance requests and concerns.  I have also sent an update to the patient's primary APP to make her aware as well.  Thanks so much for the opportunity to care for this patient!    Mike Craze, NP [place] [phone]

## 2015-02-05 NOTE — Telephone Encounter (Signed)
noted 

## 2015-02-05 NOTE — Telephone Encounter (Signed)
I received an in-basket message from Lenon Oms NP.  She asked that I follow up with patient regarding financial issues.  I called Mr. Loren to follow up.  He stated he has spoken to billing and to financial advocates but there has not been a resolution to financial issue.  I listened as he explained his concerns.  I stated I would check with [facility] financial advocates on the status of his complaint.  He was frustrated and no one has contacted him regarding concern.

## 2015-02-05 NOTE — [facility] Oncology Navigator Note (Unsigned)
I spoke with financial advocates here at [facility]  They stated MR. Staup has spoken with them.  Raquel stated billing has tried to contact patient, unsuccessfully.  Raquel sent an email for billing to contact patient again.  I will call to follow up.

## 2015-02-06 ENCOUNTER — Telehealth: Payer: Self-pay | Admitting: *Deleted

## 2015-02-06 NOTE — Telephone Encounter (Signed)
I called patient to follow up regarding billing concerns.  I stated I have spoke with financial advocate regarding patient receiving statements about going into collections.  Raquel stated the claim has been voided and re-submitted.  I asked that he calls billing with other billing concerns.

## 2015-02-13 ENCOUNTER — Other Ambulatory Visit: Payer: BLUE CROSS/BLUE SHIELD

## 2015-02-13 ENCOUNTER — Telehealth: Payer: Self-pay | Admitting: Internal Medicine

## 2015-02-13 NOTE — Telephone Encounter (Signed)
Patient had called in and left a message to cancel todays lab as he is scheduled for lab next week and needs it q3 weeks

## 2015-02-14 ENCOUNTER — Ambulatory Visit (HOSPITAL_COMMUNITY): Payer: BLUE CROSS/BLUE SHIELD

## 2015-02-14 ENCOUNTER — Other Ambulatory Visit: Payer: BLUE CROSS/BLUE SHIELD

## 2015-02-20 ENCOUNTER — Encounter (HOSPITAL_COMMUNITY): Payer: Self-pay

## 2015-02-20 ENCOUNTER — Ambulatory Visit (HOSPITAL_COMMUNITY)
Admission: RE | Admit: 2015-02-20 | Discharge: 2015-02-20 | Disposition: A | Discharge status: Home / Self Care | Payer: BLUE CROSS/BLUE SHIELD | Source: Ambulatory Visit | Attending: Adult Health | Admitting: Adult Health

## 2015-02-20 DIAGNOSIS — C7951 Secondary malignant neoplasm of bone: Secondary | ICD-10-CM | POA: Diagnosis not present

## 2015-02-20 DIAGNOSIS — C3490 Malignant neoplasm of unspecified part of unspecified bronchus or lung: Secondary | ICD-10-CM | POA: Diagnosis not present

## 2015-02-20 MED ORDER — IOHEXOL 300 MG/ML  SOLN
100.0000 mL | Freq: Once | INTRAMUSCULAR | Status: AC | PRN
  Administered 2015-02-20: 100 mL via INTRAVENOUS

## 2015-02-21 ENCOUNTER — Other Ambulatory Visit (HOSPITAL_BASED_OUTPATIENT_CLINIC_OR_DEPARTMENT_OTHER): Payer: BLUE CROSS/BLUE SHIELD

## 2015-02-21 ENCOUNTER — Other Ambulatory Visit: Payer: BLUE CROSS/BLUE SHIELD

## 2015-02-21 ENCOUNTER — Ambulatory Visit (HOSPITAL_BASED_OUTPATIENT_CLINIC_OR_DEPARTMENT_OTHER): Payer: BLUE CROSS/BLUE SHIELD | Admitting: Internal Medicine

## 2015-02-21 ENCOUNTER — Telehealth: Payer: Self-pay | Admitting: Internal Medicine

## 2015-02-21 ENCOUNTER — Ambulatory Visit (HOSPITAL_BASED_OUTPATIENT_CLINIC_OR_DEPARTMENT_OTHER): Payer: BLUE CROSS/BLUE SHIELD

## 2015-02-21 ENCOUNTER — Encounter: Payer: Self-pay | Admitting: Internal Medicine

## 2015-02-21 VITALS — BP 160/93 | HR 77 | Temp 98.8°F | Resp 18 | Ht 71.0 in | Wt 157.8 lb

## 2015-02-21 DIAGNOSIS — C3491 Malignant neoplasm of unspecified part of right bronchus or lung: Secondary | ICD-10-CM

## 2015-02-21 DIAGNOSIS — C7951 Secondary malignant neoplasm of bone: Secondary | ICD-10-CM

## 2015-02-21 DIAGNOSIS — C782 Secondary malignant neoplasm of pleura: Secondary | ICD-10-CM

## 2015-02-21 DIAGNOSIS — C801 Malignant (primary) neoplasm, unspecified: Secondary | ICD-10-CM

## 2015-02-21 DIAGNOSIS — J90 Pleural effusion, not elsewhere classified: Secondary | ICD-10-CM

## 2015-02-21 DIAGNOSIS — E041 Nontoxic single thyroid nodule: Secondary | ICD-10-CM

## 2015-02-21 DIAGNOSIS — Z86010 Personal history of colonic polyps: Secondary | ICD-10-CM

## 2015-02-21 DIAGNOSIS — D34 Benign neoplasm of thyroid gland: Secondary | ICD-10-CM | POA: Diagnosis not present

## 2015-02-21 DIAGNOSIS — C3490 Malignant neoplasm of unspecified part of unspecified bronchus or lung: Secondary | ICD-10-CM

## 2015-02-21 DIAGNOSIS — Z5111 Encounter for antineoplastic chemotherapy: Secondary | ICD-10-CM | POA: Diagnosis not present

## 2015-02-21 LAB — CBC WITH DIFFERENTIAL/PLATELET
BASO%: 0.6 % (ref 0.0–2.0)
Basophils Absolute: 0.1 10*3/uL (ref 0.0–0.1)
EOS ABS: 0.1 10*3/uL (ref 0.0–0.5)
EOS%: 1.4 % (ref 0.0–7.0)
HCT: 40.8 % (ref 38.4–49.9)
HGB: 14 g/dL (ref 13.0–17.1)
LYMPH#: 2 10*3/uL (ref 0.9–3.3)
LYMPH%: 24.1 % (ref 14.0–49.0)
MCH: 38.5 pg — ABNORMAL HIGH (ref 27.2–33.4)
MCHC: 34.3 g/dL (ref 32.0–36.0)
MCV: 112.2 fL — AB (ref 79.3–98.0)
MONO#: 1 10*3/uL — ABNORMAL HIGH (ref 0.1–0.9)
MONO%: 12 % (ref 0.0–14.0)
NEUT%: 61.9 % (ref 39.0–75.0)
NEUTROS ABS: 5 10*3/uL (ref 1.5–6.5)
Platelets: 359 10*3/uL (ref 140–400)
RBC: 3.64 10*6/uL — AB (ref 4.20–5.82)
RDW: 17.4 % — ABNORMAL HIGH (ref 11.0–14.6)
WBC: 8.1 10*3/uL (ref 4.0–10.3)

## 2015-02-21 LAB — COMPREHENSIVE METABOLIC PANEL (CC13)
ALT: 20 U/L (ref 0–55)
AST: 26 U/L (ref 5–34)
Albumin: 4.2 g/dL (ref 3.5–5.0)
Alkaline Phosphatase: 82 U/L (ref 40–150)
Anion Gap: 13 mEq/L — ABNORMAL HIGH (ref 3–11)
BUN: 12.7 mg/dL (ref 7.0–26.0)
CO2: 25 mEq/L (ref 22–29)
Calcium: 9 mg/dL (ref 8.4–10.4)
Chloride: 109 mEq/L (ref 98–109)
Creatinine: 0.8 mg/dL (ref 0.7–1.3)
Glucose: 100 mg/dl (ref 70–140)
Potassium: 3.1 mEq/L — ABNORMAL LOW (ref 3.5–5.1)
Sodium: 146 mEq/L — ABNORMAL HIGH (ref 136–145)
TOTAL PROTEIN: 7 g/dL (ref 6.4–8.3)
Total Bilirubin: 1.03 mg/dL (ref 0.20–1.20)

## 2015-02-21 MED ORDER — SODIUM CHLORIDE 0.9 % IV SOLN
485.0000 mg/m2 | Freq: Once | INTRAVENOUS | Status: AC
  Administered 2015-02-21: 900 mg via INTRAVENOUS
  Filled 2015-02-21: qty 36

## 2015-02-21 MED ORDER — SODIUM CHLORIDE 0.9 % IV SOLN
Freq: Once | INTRAVENOUS | Status: AC
  Administered 2015-02-21: 13:00:00 via INTRAVENOUS
  Filled 2015-02-21: qty 4

## 2015-02-21 MED ORDER — SODIUM CHLORIDE 0.9 % IV SOLN
Freq: Once | INTRAVENOUS | Status: AC
  Administered 2015-02-21: 12:00:00 via INTRAVENOUS

## 2015-02-21 NOTE — Telephone Encounter (Signed)
gave and printed appt sched and avs for pt for April

## 2015-02-21 NOTE — Progress Notes (Signed)
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  Genomic Alterations Identified? FGFR2 A97T KRAS Q61H - subclonal? ARID1A Q3300* - subclonal?  PRIOR THERAPY:  1) Status post right video-assisted thoracoscopy, drainage of pleural effusion, pleural biopsy, partial decortication, talc pleurodesis.under the care of Dr. Roxan Hockey on 06/23/2014. 2) Systemic chemotherapy with carboplatin for AUC of 5 and Alimta 500 mg/M2 every 3 weeks. He is status post 6 cycles, with stable disease after cycle #6.  CURRENT THERAPY: Maintenance chemotherapy with single agent Alimta 500 MG/M2 every 3 weeks. First dose 12/20/2014. He is status post 3 cycles.  INTERVAL HISTORY: Henry [name] 64 y.o. male returns to the clinic today for follow-up visit. The patient tolerated the last cycle of her systemic chemotherapy fairly well with no significant adverse effects. He complains today of some swelling of the lower extremities and he was recently treated with Lasix with mild improvement. The patient also has few episodes of diarrhea and he thinks that his diarrhea increased after starting the diuretics. He denied having any significant fever or chills, no nausea or vomiting. The patient denied having any significant chest pain, shortness of breath, cough or hemoptysis. He has no significant weight loss or night sweats. He had repeat CT scan of the chest, abdomen and pelvis performed recently and he is here for evaluation and discussion of his scan results and recommendation regarding treatment of his condition.  MEDICAL HISTORY: Past Medical History  Diagnosis Date  . Hypertension   . GERD (gastroesophageal reflux disease)    . Hemochromatosis   . Hx of adenomatous colonic polyps   . Umbilical hernia     none since weight loss  . Diverticulosis   . Lumbar spondylosis     ankle  . Pleural effusion on right 12/16/13    per chest xray  . Shortness of breath     with  exertion  . Chronic pancreatitis   . IBS (irritable bowel syndrome)   . Lung cancer 06/2014  . 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  MEDICATIONS:  Current Outpatient Prescriptions  Medication Sig Dispense Refill  . albuterol (PROVENTIL HFA;VENTOLIN HFA) 108 (90 BASE) MCG/ACT inhaler Inhale 1-2 puffs into the lungs every 4 (four) hours as needed for wheezing or shortness of breath.     . dexamethasone (DECADRON) 4 MG tablet 4 mg by mouth twice a day the day before, day of and day after the chemotherapy every 3 weeks. 40 tablet 1  . diltiazem (CARDIZEM CD) 360 MG 24 hr capsule Take 360 mg by mouth every morning.     . fluconazole (DIFLUCAN) 100 MG tablet   0  . folic acid (FOLVITE) 1 MG tablet Take 1 tablet (1 mg total) by mouth daily. 30 tablet 3  . furosemide (LASIX) 20 MG tablet Take 1 tablet (20 mg total) by mouth daily. 30 tablet 0  . hydrochlorothiazide (HYDRODIURIL) 25 MG tablet Take 25 mg by mouth every evening.     Marland Kitchen ibuprofen (ADVIL,MOTRIN) 200 MG tablet Take 400 mg by mouth every 6 (six) hours as needed (Pain).    . nebivolol (BYSTOLIC) 5 MG tablet Take 5 mg by mouth every evening.     Marland Kitchen  oxycodone (OXY-IR) 5 MG capsule Take 1 capsule (5 mg total) by mouth every 4 (four) hours as needed. 30 capsule 0  . Pancrelipase, Lip-Prot-Amyl, 20880 UNITS TABS Take 2 tablets by mouth 3 (three) times daily with meals. 540 tablet 2  . potassium chloride SA (K-DUR,KLOR-CON) 20 MEQ tablet Take 2 tablets (40 mEq total) by mouth 2 (two) times daily. 60 tablet 1  . Probiotic Product (ALIGN) 4 MG CAPS Take 4 mg by mouth daily.     . prochlorperazine (COMPAZINE) 10 MG tablet Take 1 tablet (10 mg total) by mouth every  6 (six) hours as needed for nausea or vomiting. 30 tablet 0  . tamsulosin (FLOMAX) 0.4 MG CAPS capsule     . VOLTAREN 1 % GEL Apply 2 g topically 3 (three) times a week. Used on ankle     No current facility-administered medications for this visit.    SURGICAL HISTORY:  Past Surgical History  Procedure Laterality Date  . Lumbar laminectomy  1983  . Knee arthrsoscopy  2003    left  . Tonsillectomy  1962  . Eus N/A 04/07/2013    Procedure: UPPER ENDOSCOPIC ULTRASOUND (EUS) LINEAR;  Surgeon: [name], MD;  Location: [facility];  Service: Endoscopy;  Laterality: N/A;  . Video assisted thoracoscopy (vats)/decortication Right 06/22/2014    Procedure: VIDEO ASSISTED THORACOSCOPY (VATS)/DECORTICATION;  Surgeon: [name], MD;  Location: [facility];  Service: Thoracic;  Laterality: Right;  . Pleural effusion drainage Right 06/22/2014    Procedure: DRAINAGE OF PLEURAL EFFUSION;  Surgeon: [name], MD;  Location: [facility];  Service: Thoracic;  Laterality: Right;  . Eus N/A 07/20/2014    Procedure: UPPER ENDOSCOPIC ULTRASOUND (EUS) LINEAR;  Surgeon: [name], MD;  Location: [facility];  Service: Endoscopy;  Laterality: N/A;    REVIEW OF SYSTEMS:  Constitutional: positive for fatigue Eyes: negative Ears, nose, mouth, throat, and face: negative Respiratory: negative Cardiovascular: negative Gastrointestinal: positive for diarrhea Genitourinary:negative Integument/breast: negative Hematologic/lymphatic: negative Musculoskeletal:negative Neurological: negative Behavioral/Psych: negative Endocrine: negative Allergic/Immunologic: negative   PHYSICAL EXAMINATION: General appearance: alert, cooperative and no distress Head: Normocephalic, without obvious abnormality, atraumatic Neck: no adenopathy, no JVD, supple, symmetrical, trachea midline and thyroid not enlarged, symmetric, no tenderness/mass/nodules Lymph nodes: Cervical, supraclavicular, and axillary nodes  normal. Resp: clear to auscultation bilaterally Back: symmetric, no curvature. ROM normal. No CVA tenderness. Cardio: regular rate and rhythm, S1, S2 normal, no murmur, click, rub or gallop GI: soft, non-tender; bowel sounds normal; no masses,  no organomegaly Extremities: edema 1+ bilaterally Neurologic: Alert and oriented X 3, normal strength and tone. Normal symmetric reflexes. Normal coordination and gait  ECOG PERFORMANCE STATUS: 1 - Symptomatic but completely ambulatory  There were no vitals taken for this visit.  LABORATORY DATA: Lab Results  Component Value Date   WBC 8.1 02/21/2015   HGB 14.0 02/21/2015   HCT 40.8 02/21/2015   MCV 112.2* 02/21/2015   PLT 359 02/21/2015      Chemistry      Component Value Date/Time   NA 147* 01/30/2015 1006   NA 140 06/24/2014 0346   K 3.5 01/30/2015 1006   K 3.3* 06/24/2014 0346   CL 102 06/24/2014 0346   CO2 28 01/30/2015 1006   CO2 28 06/24/2014 0346   BUN 16.5 01/30/2015 1006   BUN 11 06/24/2014 0346   CREATININE 0.9 01/30/2015 1006   CREATININE 0.75 06/24/2014 0346      Component Value Date/Time   CALCIUM 9.0 01/30/2015  1006   CALCIUM 8.6 06/24/2014 0346   ALKPHOS 78 01/30/2015 1006   ALKPHOS 54 06/24/2014 0346   AST 28 01/30/2015 1006   AST 13 06/24/2014 0346   ALT 22 01/30/2015 1006   ALT 11 06/24/2014 0346   BILITOT 0.73 01/30/2015 1006   BILITOT 0.6 06/24/2014 0346       RADIOGRAPHIC STUDIES: Ct Chest W Contrast  02/20/2015   CLINICAL DATA:  Lung cancer diagnosed 8 months ago with chemotherapy ongoing. History of chronic pancreatitis. Subsequent encounter.  EXAM: CT CHEST, ABDOMEN, AND PELVIS WITH CONTRAST  TECHNIQUE: Multidetector CT imaging of the chest, abdomen and pelvis was performed following the standard protocol during bolus administration of intravenous contrast.  CONTRAST:  189m OMNIPAQUE IOHEXOL 300 MG/ML  SOLN  COMPARISON:  Prior examinations 12/20/2014 and 10/18/2014. PET-CT 06/30/2014.  FINDINGS: CT  CHEST FINDINGS  Mediastinum/Nodes: There are stable mildly prominent axillary and AP window lymph nodes. No progressive adenopathy demonstrated. There is stable mild nodularity of the thyroid gland and distal esophageal wall thickening. The heart size is normal. There is no pericardial effusion.There is mild atherosclerosis of the aorta, great vessels and coronary arteries.  Lungs/Pleura: Status post talc pleurodesis on the right. The chronic loculated hydro pneumothorax has slightly decreased in volume, especially the air component. Pleural thickening posteriorly appears grossly stable without obvious pleural-based mass. The left upper lobe lesion demonstrates continued decrease in size and new cavitation, now measuring 10 x 8 mm on image 16. The left lower lobe lesion has not significantly changed, measuring 10 x 5 mm. The nodular airspace opacities in the right middle lobe and the dense consolidation in the right lower lobe have not significantly changed. Additional scattered subpleural nodularity in both lungs is stable. No enlarging nodules identified.  Musculoskeletal/Chest wall: Mild generalized chest wall edema. No chest wall mass or suspicious osseous findings in the chest.  CT ABDOMEN AND PELVIS FINDINGS  Hepatobiliary: Multiple hepatic cysts are stable. There are no worrisome hepatic findings. No evidence of gallstones, gallbladder wall thickening or biliary dilatation.  Pancreas: There is a stable cystic lesion within the pancreatic head, measuring 13 mm on image 73. No evidence of pancreatic mass or pancreatic ductal dilatation.  Spleen: Normal in size without focal abnormality.  Adrenals/Urinary Tract: There is mild thickening of both adrenal glands without focal mass.The kidneys appear normal without evidence of urinary tract calculus, suspicious lesion or hydronephrosis. No bladder abnormalities are seen.  Stomach/Bowel: The stomach and small bowel demonstrate no significant findings. There is mild  generalized colonic wall thickening. There are 3 well-circumscribed intraluminal masses within the right colon, measuring 1.8 cm on image 82, 1.7 cm on image 85, and 2.4 cm on image 88. No evidence of bowel obstruction.  Vascular/Lymphatic: Small retroperitoneal lymph nodes are stable. No pathologically enlarged lymph nodes identified. There is stable atherosclerosis of the aorta, its branches and the iliac arteries. Multiple collateral veins in the left upper quadrant of the abdomen are again noted. 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 The portal and superior mesenteric veins are patent.  Reproductive: The prostate gland appears mildly enlarged without apparent focal abnormality.  Other: There is increased density throughout the subcutaneous and mesenteric fat consistent with anasarca. Minimal free fluid in the pelvis.  Musculoskeletal: Fluid and calcifications are noted within the left iliopsoas bursa. Blastic L1 metastasis appears slightly larger, measuring 2.8 x 2.5 cm. Other tiny sclerotic lesions are unchanged. There are stable endplate changes at LY2-4  IMPRESSION: 1. Continued regression of  left upper lobe lesion, now centrally cavitated. Additional nodularity in both lungs is stable. 2. Decreased volume of right-sided hydro pneumothorax following talc pleurodesis. Mild pleural nodularity appears unchanged. 3. Stable small mediastinal and axillary lymph nodes. 4. Slight progression of blastic metastasis at L1. No other osseous metastases identified. 5. No other signs of metastatic disease within the abdomen or pelvis. 6. Three intraluminal masses within the right colon worrisome for villous adenomas. Colonoscopy recommended.   Electronically Signed   By: Richardean Sale M.D.   On: 02/20/2015 10:25   Ct Abdomen Pelvis W Contrast  02/20/2015   CLINICAL DATA:  Lung cancer diagnosed 8 months ago with chemotherapy ongoing. History of chronic pancreatitis. Subsequent encounter.  EXAM: CT CHEST, ABDOMEN,  AND PELVIS WITH CONTRAST  TECHNIQUE: Multidetector CT imaging of the chest, abdomen and pelvis was performed following the standard protocol during bolus administration of intravenous contrast.  CONTRAST:  124m OMNIPAQUE IOHEXOL 300 MG/ML  SOLN  COMPARISON:  Prior examinations 12/20/2014 and 10/18/2014. PET-CT 06/30/2014.  FINDINGS: CT CHEST FINDINGS  Mediastinum/Nodes: There are stable mildly prominent axillary and AP window lymph nodes. No progressive adenopathy demonstrated. There is stable mild nodularity of the thyroid gland and distal esophageal wall thickening. The heart size is normal. There is no pericardial effusion.There is mild atherosclerosis of the aorta, great vessels and coronary arteries.  Lungs/Pleura: Status post talc pleurodesis on the right. The chronic loculated hydro pneumothorax has slightly decreased in volume, especially the air component. Pleural thickening posteriorly appears grossly stable without obvious pleural-based mass. The left upper lobe lesion demonstrates continued decrease in size and new cavitation, now measuring 10 x 8 mm on image 16. The left lower lobe lesion has not significantly changed, measuring 10 x 5 mm. The nodular airspace opacities in the right middle lobe and the dense consolidation in the right lower lobe have not significantly changed. Additional scattered subpleural nodularity in both lungs is stable. No enlarging nodules identified.  Musculoskeletal/Chest wall: Mild generalized chest wall edema. No chest wall mass or suspicious osseous findings in the chest.  CT ABDOMEN AND PELVIS FINDINGS  Hepatobiliary: Multiple hepatic cysts are stable. There are no worrisome hepatic findings. No evidence of gallstones, gallbladder wall thickening or biliary dilatation.  Pancreas: There is a stable cystic lesion within the pancreatic head, measuring 13 mm on image 73. No evidence of pancreatic mass or pancreatic ductal dilatation.  Spleen: Normal in size without focal  abnormality.  Adrenals/Urinary Tract: There is mild thickening of both adrenal glands without focal mass.The kidneys appear normal without evidence of urinary tract calculus, suspicious lesion or hydronephrosis. No bladder abnormalities are seen.  Stomach/Bowel: The stomach and small bowel demonstrate no significant findings. There is mild generalized colonic wall thickening. There are 3 well-circumscribed intraluminal masses within the right colon, measuring 1.8 cm on image 82, 1.7 cm on image 85, and 2.4 cm on image 88. No evidence of bowel obstruction.  Vascular/Lymphatic: Small retroperitoneal lymph nodes are stable. No pathologically enlarged lymph nodes identified. There is stable atherosclerosis of the aorta, its branches and the iliac arteries. Multiple collateral veins in the left upper quadrant of the abdomen are again noted. 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 The portal and superior mesenteric veins are patent.  Reproductive: The prostate gland appears mildly enlarged without apparent focal abnormality.  Other: There is increased density throughout the subcutaneous and mesenteric fat consistent with anasarca. Minimal free fluid in the pelvis.  Musculoskeletal: Fluid and calcifications are noted within the left  iliopsoas bursa. Blastic L1 metastasis appears slightly larger, measuring 2.8 x 2.5 cm. Other tiny sclerotic lesions are unchanged. There are stable endplate changes at H3-7.  IMPRESSION: 1. Continued regression of left upper lobe lesion, now centrally cavitated. Additional nodularity in both lungs is stable. 2. Decreased volume of right-sided hydro pneumothorax following talc pleurodesis. Mild pleural nodularity appears unchanged. 3. Stable small mediastinal and axillary lymph nodes. 4. Slight progression of blastic metastasis at L1. No other osseous metastases identified. 5. No other signs of metastatic disease within the abdomen or pelvis. 6. Three intraluminal masses within the right  colon worrisome for villous adenomas. Colonoscopy recommended.   Electronically Signed   By: Richardean Sale M.D.   On: 02/20/2015 10:25   ASSESSMENT AND PLAN: This is a very pleasant 64 years old white male with metastatic adenocarcinoma of unknown primary questionable for upper gastrointestinal, pancreatic or biliary or primary lung cancer. He completed systemic chemotherapy with carboplatin and Alimta status post 6 cycles and tolerating his treatment fairly well with a stable disease. He is currently undergoing maintenance chemotherapy with single agent Alimta status post 3 cycles and the recent CT scan of the chest showed continued regression of the left upper lobe lesion and decrease in the volume of the right-sided hydropneumothorax. There is a slight increased and the metastatic disease at L1. I discussed the scan results with the patient today and recommended for him to continue treatment with maintenance Alimta at the same dose. He would come back for follow-up visit in 3 weeks with the next cycle of his chemotherapy. The recent CT scan of the chest also showed 3 intraluminal masses in the right colon worrisome for villous adenomas. I recommended for the patient to see his gastroenterologist for evaluation. He was seen in the past by Dr. Olevia Perches but he would like to see a different gastroenterologist, Dr. Paulita Fujita with the gastroenterology. I will make a referral for the patient to see Dr. Paulita Fujita based on his request. I also discussed with the patient and his treatment options for the thyroid papillary carcinoma and recommended for him to follow-up with his surgeon for evaluation and consideration of resection. We can always reschedule his chemotherapy for the patient to undergo his surgical resection if needed. He was advised to call immediately if he has any concerning symptoms in the interval. The patient voices understanding of current disease status and treatment options and is in agreement with  the current care plan.  All questions were answered. The patient knows to call the clinic with any problems, questions or concerns. We can certainly see the patient much sooner if necessary.  Disclaimer: This note was dictated with voice recognition software. Similar sounding words can inadvertently be transcribed and may not be corrected upon review.

## 2015-02-21 NOTE — Progress Notes (Signed)
Quick Note:  Call patient with the result and order K Dur 20 meq po qd x7 days ______ 

## 2015-02-21 NOTE — Patient Instructions (Signed)
[facility] Discharge Instructions for Patients Receiving Chemotherapy  Today you received the following chemotherapy agents Alimta.  To help prevent nausea and vomiting after your treatment, we encourage you to take your nausea medication as prescribed.   If you develop nausea and vomiting that is not controlled by your nausea medication, call the clinic.   BELOW ARE SYMPTOMS THAT SHOULD BE REPORTED IMMEDIATELY:  *FEVER GREATER THAN 100.5 F  *CHILLS WITH OR WITHOUT FEVER  NAUSEA AND VOMITING THAT IS NOT CONTROLLED WITH YOUR NAUSEA MEDICATION  *UNUSUAL SHORTNESS OF BREATH  *UNUSUAL BRUISING OR BLEEDING  TENDERNESS IN MOUTH AND THROAT WITH OR WITHOUT PRESENCE OF ULCERS  *URINARY PROBLEMS  *BOWEL PROBLEMS  UNUSUAL RASH Items with * indicate a potential emergency and should be followed up as soon as possible.  Feel free to call the clinic you have any questions or concerns. The clinic phone number is [phone].  Please show the CHEMO ALERT CARD at check-in to the Emergency Department and triage nurse.   

## 2015-02-22 ENCOUNTER — Telehealth: Payer: Self-pay | Admitting: *Deleted

## 2015-02-22 MED ORDER — POTASSIUM CHLORIDE CRYS ER 20 MEQ PO TBCR: 20.0000 meq | EXTENDED_RELEASE_TABLET | Freq: Every day | ORAL | Status: DC

## 2015-02-22 NOTE — Telephone Encounter (Signed)
-----   Message from Curt Bears, MD sent at 02/21/2015  7:05 PM EDT ----- Call patient with the result and order K Dur 20 meq po qd x 7 days.

## 2015-02-22 NOTE — Telephone Encounter (Signed)
Per MD, notified pt with results and below instructions. Pt states" I am supposed to take K+ everyday but I forget sometimes. I will try to do better. I know what to do." No further concerns.

## 2015-03-02 ENCOUNTER — Other Ambulatory Visit: Payer: Self-pay | Admitting: Medical Oncology

## 2015-03-02 DIAGNOSIS — C7A00 Malignant carcinoid tumor of unspecified site: Secondary | ICD-10-CM

## 2015-03-02 MED ORDER — OXYCODONE HCL 5 MG PO CAPS: 5.0000 mg | ORAL_CAPSULE | ORAL | Status: DC | PRN

## 2015-03-02 NOTE — Progress Notes (Signed)
Rx locked in injection

## 2015-03-03 ENCOUNTER — Other Ambulatory Visit: Payer: Self-pay | Admitting: Internal Medicine

## 2015-03-04 ENCOUNTER — Other Ambulatory Visit: Payer: Self-pay | Admitting: Adult Health

## 2015-03-08 ENCOUNTER — Other Ambulatory Visit: Payer: Self-pay | Admitting: Adult Health

## 2015-03-15 ENCOUNTER — Other Ambulatory Visit: Payer: Self-pay | Admitting: Adult Health

## 2015-03-15 ENCOUNTER — Telehealth: Payer: Self-pay | Admitting: Internal Medicine

## 2015-03-15 ENCOUNTER — Ambulatory Visit (HOSPITAL_BASED_OUTPATIENT_CLINIC_OR_DEPARTMENT_OTHER): Payer: BLUE CROSS/BLUE SHIELD | Admitting: Physician Assistant

## 2015-03-15 ENCOUNTER — Ambulatory Visit (HOSPITAL_COMMUNITY): Payer: BLUE CROSS/BLUE SHIELD | Attending: Internal Medicine

## 2015-03-15 ENCOUNTER — Ambulatory Visit (HOSPITAL_BASED_OUTPATIENT_CLINIC_OR_DEPARTMENT_OTHER): Payer: BLUE CROSS/BLUE SHIELD

## 2015-03-15 ENCOUNTER — Encounter: Payer: Self-pay | Admitting: Physician Assistant

## 2015-03-15 VITALS — BP 160/95 | HR 75 | Temp 97.5°F | Resp 18 | Ht 71.0 in | Wt 156.6 lb

## 2015-03-15 DIAGNOSIS — C3490 Malignant neoplasm of unspecified part of unspecified bronchus or lung: Secondary | ICD-10-CM

## 2015-03-15 DIAGNOSIS — C7951 Secondary malignant neoplasm of bone: Secondary | ICD-10-CM | POA: Diagnosis not present

## 2015-03-15 DIAGNOSIS — R600 Localized edema: Secondary | ICD-10-CM | POA: Diagnosis not present

## 2015-03-15 DIAGNOSIS — Z5111 Encounter for antineoplastic chemotherapy: Secondary | ICD-10-CM | POA: Diagnosis not present

## 2015-03-15 DIAGNOSIS — C801 Malignant (primary) neoplasm, unspecified: Secondary | ICD-10-CM | POA: Diagnosis not present

## 2015-03-15 DIAGNOSIS — C3491 Malignant neoplasm of unspecified part of right bronchus or lung: Secondary | ICD-10-CM

## 2015-03-15 LAB — CBC WITH DIFFERENTIAL/PLATELET
BASO%: 0.8 % (ref 0.0–2.0)
Basophils Absolute: 0.1 10*3/uL (ref 0.0–0.1)
EOS%: 4.6 % (ref 0.0–7.0)
Eosinophils Absolute: 0.3 10*3/uL (ref 0.0–0.5)
HCT: 39.7 % (ref 38.4–49.9)
HGB: 13.7 g/dL (ref 13.0–17.1)
LYMPH#: 1.5 10*3/uL (ref 0.9–3.3)
LYMPH%: 20.2 % (ref 14.0–49.0)
MCH: 37.8 pg — AB (ref 27.2–33.4)
MCHC: 34.4 g/dL (ref 32.0–36.0)
MCV: 110 fL — ABNORMAL HIGH (ref 79.3–98.0)
MONO#: 1.2 10*3/uL — AB (ref 0.1–0.9)
MONO%: 16.9 % — ABNORMAL HIGH (ref 0.0–14.0)
NEUT#: 4.2 10*3/uL (ref 1.5–6.5)
NEUT%: 57.5 % (ref 39.0–75.0)
Platelets: 335 10*3/uL (ref 140–400)
RBC: 3.61 10*6/uL — AB (ref 4.20–5.82)
RDW: 17.2 % — ABNORMAL HIGH (ref 11.0–14.6)
WBC: 7.4 10*3/uL (ref 4.0–10.3)

## 2015-03-15 LAB — COMPREHENSIVE METABOLIC PANEL (CC13)
ALBUMIN: 3.8 g/dL (ref 3.5–5.0)
ALT: 14 U/L (ref 0–55)
AST: 26 U/L (ref 5–34)
Alkaline Phosphatase: 77 U/L (ref 40–150)
Anion Gap: 13 mEq/L — ABNORMAL HIGH (ref 3–11)
BUN: 11 mg/dL (ref 7.0–26.0)
CHLORIDE: 108 meq/L (ref 98–109)
CO2: 26 mEq/L (ref 22–29)
Calcium: 8.5 mg/dL (ref 8.4–10.4)
Creatinine: 0.8 mg/dL (ref 0.7–1.3)
EGFR: >90 mL/min/{1.73_m2} (ref >90)
GLUCOSE: 102 mg/dL (ref 70–140)
Potassium: 3.4 mEq/L — ABNORMAL LOW (ref 3.5–5.1)
Sodium: 146 mEq/L — ABNORMAL HIGH (ref 136–145)
Total Bilirubin: 1.01 mg/dL (ref 0.20–1.20)
Total Protein: 6.2 g/dL — ABNORMAL LOW (ref 6.4–8.3)

## 2015-03-15 MED ORDER — SODIUM CHLORIDE 0.9 % IV SOLN
485.0000 mg/m2 | Freq: Once | INTRAVENOUS | Status: AC
  Administered 2015-03-15: 900 mg via INTRAVENOUS
  Filled 2015-03-15: qty 36

## 2015-03-15 MED ORDER — SODIUM CHLORIDE 0.9 % IV SOLN
Freq: Once | INTRAVENOUS | Status: AC
  Administered 2015-03-15: 12:00:00 via INTRAVENOUS
  Filled 2015-03-15: qty 4

## 2015-03-15 MED ORDER — SODIUM CHLORIDE 0.9 % IV SOLN
Freq: Once | INTRAVENOUS | Status: AC
  Administered 2015-03-15: 12:00:00 via INTRAVENOUS

## 2015-03-15 NOTE — Telephone Encounter (Signed)
Gave avs & calendar for May. Sent message to schedule treatment. °

## 2015-03-15 NOTE — Progress Notes (Signed)
Bilateral lower extremity venous Doppler performed

## 2015-03-15 NOTE — Progress Notes (Addendum)
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  Genomic Alterations Identified? FGFR2 A97T KRAS Q61H - subclonal? ARID1A [id number]* - subclonal?  PRIOR THERAPY:  1) Status post right video-assisted thoracoscopy, drainage of pleural effusion, pleural biopsy, partial decortication, talc pleurodesis.under the care of Dr. Roxan Hockey on 06/23/2014. 2) Systemic chemotherapy with carboplatin for AUC of 5 and Alimta 500 mg/M2 every 3 weeks. He is status post 6 cycles, with stable disease after cycle #6.  CURRENT THERAPY: Maintenance chemotherapy with single agent Alimta 500 MG/M2 every 3 weeks. First dose 12/20/2014. He is status post 3 cycles.  INTERVAL HISTORY: Henry [name] 64 y.o. male returns to the clinic today for follow-up visit. The patient tolerated the last cycle of her systemic chemotherapy fairly well with no significant adverse effects. He continues to complain of some swelling of the lower extremities and he was recently treated with Lasix with minimal improvement. The patient continues to have episodes of diarrhea however this also has been a long-standing issue.  He denied having any significant fever or chills, no nausea or vomiting. The patient denied having any significant chest pain, shortness of breath, cough or hemoptysis. He has no significant weight loss or night sweats. He would like to discontinue the dexamethasone that he takes around chemotherapy. He's not developed any rash or other untoward side effects from his treatment with maintenance Alimta.   MEDICAL HISTORY: Past Medical History  Diagnosis Date  . Hypertension   . GERD (gastroesophageal reflux disease)   .  Hemochromatosis   . Hx of adenomatous colonic polyps   . Umbilical hernia     none since weight loss  . Diverticulosis   . Lumbar spondylosis     ankle  . Pleural effusion on right 12/16/13    per chest xray  . Shortness of breath     with  exertion  . Chronic pancreatitis   . IBS (irritable bowel syndrome)   . Lung cancer 06/2014  . 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  MEDICATIONS:  Current Outpatient Prescriptions  Medication Sig Dispense Refill  . albuterol (PROVENTIL HFA;VENTOLIN HFA) 108 (90 BASE) MCG/ACT inhaler Inhale 1-2 puffs into the lungs every 4 (four) hours as needed for wheezing or shortness of breath.     . dexamethasone (DECADRON) 4 MG tablet 4 mg by mouth twice a day the day before, day of and day after the chemotherapy every 3 weeks. 40 tablet 1  . diltiazem (CARDIZEM CD) 360 MG 24 hr capsule Take 360 mg by mouth every morning.     . fluconazole (DIFLUCAN) 100 MG tablet   0  . folic acid (FOLVITE) 1 MG tablet Take 1 tablet (1 mg total) by mouth daily. 30 tablet 3  . furosemide (LASIX) 20 MG tablet Take 1 tablet (20 mg total) by mouth daily. 30 tablet 0  . ibuprofen (ADVIL,MOTRIN) 200 MG tablet Take 400 mg by mouth every 6 (six) hours as needed (Pain).    Marland Kitchen oxycodone (OXY-IR) 5 MG capsule Take 1 capsule (5 mg total) by mouth every 4 (four) hours as needed. 30 capsule 0  . Pancrelipase, Lip-Prot-Amyl, 20880 UNITS TABS Take 2 tablets by mouth 3 (three) times daily  with meals. 540 tablet 2  . potassium chloride SA (K-DUR,KLOR-CON) 20 MEQ tablet Take 1 tablet (20 mEq total) by mouth daily. 7 tablet 0  . Probiotic Product (ALIGN) 4 MG CAPS Take 4 mg by mouth daily.     . prochlorperazine (COMPAZINE) 10 MG tablet Take 1 tablet (10 mg total) by mouth every 6 (six) hours as needed for nausea or vomiting. 30 tablet 0  . tamsulosin (FLOMAX) 0.4 MG CAPS capsule     . VOLTAREN 1 % GEL Apply 2 g topically 3 (three) times a week. Used on  ankle    . hydrochlorothiazide (HYDRODIURIL) 25 MG tablet Take 25 mg by mouth every evening.     . nebivolol (BYSTOLIC) 5 MG tablet Take 5 mg by mouth every evening.      No current facility-administered medications for this visit.    SURGICAL HISTORY:  Past Surgical History  Procedure Laterality Date  . Lumbar laminectomy  1983  . Knee arthrsoscopy  2003    left  . Tonsillectomy  1962  . Eus N/A 04/07/2013    Procedure: UPPER ENDOSCOPIC ULTRASOUND (EUS) LINEAR;  Surgeon: [name], MD;  Location: [facility];  Service: Endoscopy;  Laterality: N/A;  . Video assisted thoracoscopy (vats)/decortication Right 06/22/2014    Procedure: VIDEO ASSISTED THORACOSCOPY (VATS)/DECORTICATION;  Surgeon: [name], MD;  Location: [facility];  Service: Thoracic;  Laterality: Right;  . Pleural effusion drainage Right 06/22/2014    Procedure: DRAINAGE OF PLEURAL EFFUSION;  Surgeon: [name], MD;  Location: [facility];  Service: Thoracic;  Laterality: Right;  . Eus N/A 07/20/2014    Procedure: UPPER ENDOSCOPIC ULTRASOUND (EUS) LINEAR;  Surgeon: [name], MD;  Location: [facility];  Service: Endoscopy;  Laterality: N/A;    REVIEW OF SYSTEMS:  Constitutional: positive for fatigue Eyes: negative Ears, nose, mouth, throat, and face: negative Respiratory: negative Cardiovascular: positive for lower extremity edema Gastrointestinal: positive for diarrhea Genitourinary:negative Integument/breast: negative Hematologic/lymphatic: negative Musculoskeletal:negative Neurological: negative Behavioral/Psych: negative Endocrine: negative Allergic/Immunologic: negative   PHYSICAL EXAMINATION: General appearance: alert, cooperative and no distress Head: Normocephalic, without obvious abnormality, atraumatic Neck: no adenopathy, no JVD, supple, symmetrical, trachea midline and thyroid not enlarged, symmetric, no tenderness/mass/nodules Lymph nodes: Cervical, supraclavicular, and axillary  nodes normal. Resp: clear to auscultation bilaterally Back: symmetric, no curvature. ROM normal. No CVA tenderness. Cardio: regular rate and rhythm, S1, S2 normal, no murmur, click, rub or gallop GI: soft, non-tender; bowel sounds normal; no masses,  no organomegaly Extremities: edema 1+ to 2+ bilaterally, right greater than left Neurologic: Alert and oriented X 3, normal strength and tone. Normal symmetric reflexes. Normal coordination and gait  ECOG PERFORMANCE STATUS: 1 - Symptomatic but completely ambulatory  Blood pressure 160/95, pulse 75, temperature 97.5 F (36.4 C), temperature source Oral, resp. rate 18, height 5' 11"  (1.803 m), weight 156 lb 9.6 oz (71.033 kg), SpO2 99 %.  LABORATORY DATA: Lab Results  Component Value Date   WBC 7.4 03/15/2015   HGB 13.7 03/15/2015   HCT 39.7 03/15/2015   MCV 110.0* 03/15/2015   PLT 335 03/15/2015      Chemistry      Component Value Date/Time   NA 146* 03/15/2015 0925   NA 140 06/24/2014 0346   K 3.4* 03/15/2015 0925   K 3.3* 06/24/2014 0346   CL 102 06/24/2014 0346   CO2 26 03/15/2015 0925   CO2 28 06/24/2014 0346   BUN 11.0 03/15/2015 0925   BUN 11 06/24/2014  0346   CREATININE 0.8 03/15/2015 0925   CREATININE 0.75 06/24/2014 0346      Component Value Date/Time   CALCIUM 8.5 03/15/2015 0925   CALCIUM 8.6 06/24/2014 0346   ALKPHOS 77 03/15/2015 0925   ALKPHOS 54 06/24/2014 0346   AST 26 03/15/2015 0925   AST 13 06/24/2014 0346   ALT 14 03/15/2015 0925   ALT 11 06/24/2014 0346   BILITOT 1.01 03/15/2015 0925   BILITOT 0.6 06/24/2014 0346       RADIOGRAPHIC STUDIES: Ct Chest W Contrast  02/20/2015   CLINICAL DATA:  Lung cancer diagnosed 8 months ago with chemotherapy ongoing. History of chronic pancreatitis. Subsequent encounter.  EXAM: CT CHEST, ABDOMEN, AND PELVIS WITH CONTRAST  TECHNIQUE: Multidetector CT imaging of the chest, abdomen and pelvis was performed following the standard protocol during bolus administration  of intravenous contrast.  CONTRAST:  149m OMNIPAQUE IOHEXOL 300 MG/ML  SOLN  COMPARISON:  Prior examinations 12/20/2014 and 10/18/2014. PET-CT 06/30/2014.  FINDINGS: CT CHEST FINDINGS  Mediastinum/Nodes: There are stable mildly prominent axillary and AP window lymph nodes. No progressive adenopathy demonstrated. There is stable mild nodularity of the thyroid gland and distal esophageal wall thickening. The heart size is normal. There is no pericardial effusion.There is mild atherosclerosis of the aorta, great vessels and coronary arteries.  Lungs/Pleura: Status post talc pleurodesis on the right. The chronic loculated hydro pneumothorax has slightly decreased in volume, especially the air component. Pleural thickening posteriorly appears grossly stable without obvious pleural-based mass. The left upper lobe lesion demonstrates continued decrease in size and new cavitation, now measuring 10 x 8 mm on image 16. The left lower lobe lesion has not significantly changed, measuring 10 x 5 mm. The nodular airspace opacities in the right middle lobe and the dense consolidation in the right lower lobe have not significantly changed. Additional scattered subpleural nodularity in both lungs is stable. No enlarging nodules identified.  Musculoskeletal/Chest wall: Mild generalized chest wall edema. No chest wall mass or suspicious osseous findings in the chest.  CT ABDOMEN AND PELVIS FINDINGS  Hepatobiliary: Multiple hepatic cysts are stable. There are no worrisome hepatic findings. No evidence of gallstones, gallbladder wall thickening or biliary dilatation.  Pancreas: There is a stable cystic lesion within the pancreatic head, measuring 13 mm on image 73. No evidence of pancreatic mass or pancreatic ductal dilatation.  Spleen: Normal in size without focal abnormality.  Adrenals/Urinary Tract: There is mild thickening of both adrenal glands without focal mass.The kidneys appear normal without evidence of urinary tract  calculus, suspicious lesion or hydronephrosis. No bladder abnormalities are seen.  Stomach/Bowel: The stomach and small bowel demonstrate no significant findings. There is mild generalized colonic wall thickening. There are 3 well-circumscribed intraluminal masses within the right colon, measuring 1.8 cm on image 82, 1.7 cm on image 85, and 2.4 cm on image 88. No evidence of bowel obstruction.  Vascular/Lymphatic: Small retroperitoneal lymph nodes are stable. No pathologically enlarged lymph nodes identified. There is stable atherosclerosis of the aorta, its branches and the iliac arteries. Multiple collateral veins in the left upper quadrant of the abdomen are again noted. 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 The portal and superior mesenteric veins are patent.  Reproductive: The prostate gland appears mildly enlarged without apparent focal abnormality.  Other: There is increased density throughout the subcutaneous and mesenteric fat consistent with anasarca. Minimal free fluid in the pelvis.  Musculoskeletal: Fluid and calcifications are noted within the left iliopsoas bursa. Blastic L1 metastasis appears  slightly larger, measuring 2.8 x 2.5 cm. Other tiny sclerotic lesions are unchanged. There are stable endplate changes at O1-1.  IMPRESSION: 1. Continued regression of left upper lobe lesion, now centrally cavitated. Additional nodularity in both lungs is stable. 2. Decreased volume of right-sided hydro pneumothorax following talc pleurodesis. Mild pleural nodularity appears unchanged. 3. Stable small mediastinal and axillary lymph nodes. 4. Slight progression of blastic metastasis at L1. No other osseous metastases identified. 5. No other signs of metastatic disease within the abdomen or pelvis. 6. Three intraluminal masses within the right colon worrisome for villous adenomas. Colonoscopy recommended.   Electronically Signed   By: Richardean Sale M.D.   On: 02/20/2015 10:25   Ct Abdomen Pelvis W  Contrast  02/20/2015   CLINICAL DATA:  Lung cancer diagnosed 8 months ago with chemotherapy ongoing. History of chronic pancreatitis. Subsequent encounter.  EXAM: CT CHEST, ABDOMEN, AND PELVIS WITH CONTRAST  TECHNIQUE: Multidetector CT imaging of the chest, abdomen and pelvis was performed following the standard protocol during bolus administration of intravenous contrast.  CONTRAST:  120m OMNIPAQUE IOHEXOL 300 MG/ML  SOLN  COMPARISON:  Prior examinations 12/20/2014 and 10/18/2014. PET-CT 06/30/2014.  FINDINGS: CT CHEST FINDINGS  Mediastinum/Nodes: There are stable mildly prominent axillary and AP window lymph nodes. No progressive adenopathy demonstrated. There is stable mild nodularity of the thyroid gland and distal esophageal wall thickening. The heart size is normal. There is no pericardial effusion.There is mild atherosclerosis of the aorta, great vessels and coronary arteries.  Lungs/Pleura: Status post talc pleurodesis on the right. The chronic loculated hydro pneumothorax has slightly decreased in volume, especially the air component. Pleural thickening posteriorly appears grossly stable without obvious pleural-based mass. The left upper lobe lesion demonstrates continued decrease in size and new cavitation, now measuring 10 x 8 mm on image 16. The left lower lobe lesion has not significantly changed, measuring 10 x 5 mm. The nodular airspace opacities in the right middle lobe and the dense consolidation in the right lower lobe have not significantly changed. Additional scattered subpleural nodularity in both lungs is stable. No enlarging nodules identified.  Musculoskeletal/Chest wall: Mild generalized chest wall edema. No chest wall mass or suspicious osseous findings in the chest.  CT ABDOMEN AND PELVIS FINDINGS  Hepatobiliary: Multiple hepatic cysts are stable. There are no worrisome hepatic findings. No evidence of gallstones, gallbladder wall thickening or biliary dilatation.  Pancreas: There is a  stable cystic lesion within the pancreatic head, measuring 13 mm on image 73. No evidence of pancreatic mass or pancreatic ductal dilatation.  Spleen: Normal in size without focal abnormality.  Adrenals/Urinary Tract: There is mild thickening of both adrenal glands without focal mass.The kidneys appear normal without evidence of urinary tract calculus, suspicious lesion or hydronephrosis. No bladder abnormalities are seen.  Stomach/Bowel: The stomach and small bowel demonstrate no significant findings. There is mild generalized colonic wall thickening. There are 3 well-circumscribed intraluminal masses within the right colon, measuring 1.8 cm on image 82, 1.7 cm on image 85, and 2.4 cm on image 88. No evidence of bowel obstruction.  Vascular/Lymphatic: Small retroperitoneal lymph nodes are stable. No pathologically enlarged lymph nodes identified. There is stable atherosclerosis of the aorta, its branches and the iliac arteries. Multiple collateral veins in the left upper quadrant of the abdomen are again noted. 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 The portal and superior mesenteric veins are patent.  Reproductive: The prostate gland appears mildly enlarged without apparent focal abnormality.  Other: There is increased  density throughout the subcutaneous and mesenteric fat consistent with anasarca. Minimal free fluid in the pelvis.  Musculoskeletal: Fluid and calcifications are noted within the left iliopsoas bursa. Blastic L1 metastasis appears slightly larger, measuring 2.8 x 2.5 cm. Other tiny sclerotic lesions are unchanged. There are stable endplate changes at W0-9.  IMPRESSION: 1. Continued regression of left upper lobe lesion, now centrally cavitated. Additional nodularity in both lungs is stable. 2. Decreased volume of right-sided hydro pneumothorax following talc pleurodesis. Mild pleural nodularity appears unchanged. 3. Stable small mediastinal and axillary lymph nodes. 4. Slight progression of  blastic metastasis at L1. No other osseous metastases identified. 5. No other signs of metastatic disease within the abdomen or pelvis. 6. Three intraluminal masses within the right colon worrisome for villous adenomas. Colonoscopy recommended.   Electronically Signed   By: Richardean Sale M.D.   On: 02/20/2015 10:25   ASSESSMENT AND PLAN: This is a very pleasant 64 years old white male with metastatic adenocarcinoma of unknown primary questionable for upper gastrointestinal, pancreatic or biliary or primary lung cancer. He completed systemic chemotherapy with carboplatin and Alimta status post 6 cycles and tolerating his treatment fairly well with a stable disease. He is currently undergoing maintenance chemotherapy with single agent Alimta status post 4 cycles and the recent CT scan of the chest showed continued regression of the left upper lobe lesion and decrease in the volume of the right-sided hydropneumothorax. There is a slight increased and the metastatic disease at L1. The patient was discussed with and also seen by Dr. Julien Nordmann. He will proceed with cycle #5 of his maintenance chemotherapy with single agent Alimta as scheduled today. To further evaluate his lower extremity edema, the patient will be scheduled to have bilateral lower extremity Dopplers to evaluate for possible deep vein thrombosis. Should a clot be present appropriate anticoagulation therapy will be instituted. The recent CT scan of the chest also showed 3 intraluminal masses in the right colon worrisome for villous adenomas. Dr. Julien Nordmann recommended for the patient to see his gastroenterologist for evaluation. He was seen in the past by Dr. Olevia Perches but he would like to see a different gastroenterologist, Dr. Paulita Fujita with the gastroenterology. Dr. Julien Nordmann made the referral for the patient to see Dr. Paulita Fujita based on his request. Dr. Julien Nordmann also discussed with the patient and his treatment options for the thyroid papillary carcinoma and  recommended for him to follow-up with his surgeon for evaluation and consideration of resection. We can always reschedule his chemotherapy for the patient to undergo his surgical resection if needed. He will follow-up in 3 weeks prior to cycle #6 after which he will be due for restaging CT scans. He was advised to call immediately if he has any concerning symptoms in the interval. The patient voices understanding of current disease status and treatment options and is in agreement with the current care plan.  All questions were answered. The patient knows to call the clinic with any problems, questions or concerns. We can certainly see the patient much sooner if necessary.  Wynetta Emery, [place], PA-C 03/15/2015  Of note the bilateral lower extremity Doppler evaluation on 03/15/2015 for deep vein thrombosis was negative.  ADDENDUM:  Hematology/Oncology Attending:  I had a face to face encounter with the patient. I recommended his care plan. This is a very pleasant 64 years old white male with metastatic adenocarcinoma of unknown primary questionable for lung versus upper gastrointestinal or pancreaticobiliary. The patient is currently on maintenance chemotherapy with single agent  Alimta and tolerating his treatment fairly well. He status post 4 cycles. He has no evidence for disease progression on the scan after cycle #3. He complains today of increased this well in of the lower extremities. I will order Doppler of the lower extremity to rule out deep venous thrombosis which was reported to be negative for deep venous thrombosis. Will also start the patient on small doses of Lasix 20 mg by mouth daily as needed for swelling. He will continue with his systemic chemotherapy today as scheduled. The patient would come back for follow-up visit in 3 weeks for reevaluation before starting cycle #6. He was advised to call immediately if he has any concerning symptoms in the interval.  Disclaimer: This note  was dictated with voice recognition software. Similar sounding words can inadvertently be transcribed and may be missed upon review. Eilleen Kempf., MD 03/18/2015

## 2015-03-15 NOTE — Patient Instructions (Signed)
[facility] Discharge Instructions for Patients Receiving Chemotherapy  Today you received the following chemotherapy agents Alimta.  To help prevent nausea and vomiting after your treatment, we encourage you to take your nausea medication as prescribed by your physician. If you develop nausea and vomiting that is not controlled by your nausea medication, call the clinic.   BELOW ARE SYMPTOMS THAT SHOULD BE REPORTED IMMEDIATELY:  *FEVER GREATER THAN 100.5 F  *CHILLS WITH OR WITHOUT FEVER  NAUSEA AND VOMITING THAT IS NOT CONTROLLED WITH YOUR NAUSEA MEDICATION  *UNUSUAL SHORTNESS OF BREATH  *UNUSUAL BRUISING OR BLEEDING  TENDERNESS IN MOUTH AND THROAT WITH OR WITHOUT PRESENCE OF ULCERS  *URINARY PROBLEMS  *BOWEL PROBLEMS  UNUSUAL RASH Items with * indicate a potential emergency and should be followed up as soon as possible.  Feel free to call the clinic you have any questions or concerns. The clinic phone number is [phone].  Please show the CHEMO ALERT CARD at check-in to the Emergency Department and triage nurse.   

## 2015-03-16 NOTE — Patient Instructions (Signed)
Your being sent for Dopplers of your lower extremities to rule out possible blood clot as a cause for your lower extremity swelling Follow-up in 3 weeks prior to next scheduled cycle of maintenance chemotherapy

## 2015-03-21 ENCOUNTER — Telehealth: Payer: Self-pay | Admitting: *Deleted

## 2015-03-21 NOTE — Telephone Encounter (Signed)
Received call from [facility], pharmacist @ [facility] [address] re:  Pt requested refill of Furosemide - last prescribed by Elzie Rings, NP on 01/30/15.  Message sent to Dr. Julien Nordmann, and Stanton Kidney, desk nurse today. [facility]  Phone    [phone]

## 2015-03-21 NOTE — Telephone Encounter (Signed)
OK to give him 10 more tab

## 2015-03-22 ENCOUNTER — Other Ambulatory Visit: Payer: Self-pay | Admitting: *Deleted

## 2015-03-22 DIAGNOSIS — R609 Edema, unspecified: Secondary | ICD-10-CM

## 2015-03-22 MED ORDER — FUROSEMIDE 20 MG PO TABS: 20.0000 mg | ORAL_TABLET | Freq: Every day | ORAL | Status: DC

## 2015-03-22 NOTE — Telephone Encounter (Signed)
Rx for lasix sent to pt pharmacy per MD

## 2015-04-05 ENCOUNTER — Telehealth: Payer: Self-pay | Admitting: Internal Medicine

## 2015-04-05 ENCOUNTER — Ambulatory Visit (HOSPITAL_BASED_OUTPATIENT_CLINIC_OR_DEPARTMENT_OTHER): Payer: BLUE CROSS/BLUE SHIELD

## 2015-04-05 ENCOUNTER — Other Ambulatory Visit (HOSPITAL_BASED_OUTPATIENT_CLINIC_OR_DEPARTMENT_OTHER): Payer: BLUE CROSS/BLUE SHIELD

## 2015-04-05 ENCOUNTER — Telehealth: Payer: Self-pay | Admitting: *Deleted

## 2015-04-05 ENCOUNTER — Ambulatory Visit (HOSPITAL_BASED_OUTPATIENT_CLINIC_OR_DEPARTMENT_OTHER): Payer: BLUE CROSS/BLUE SHIELD | Admitting: Internal Medicine

## 2015-04-05 VITALS — BP 162/83 | HR 103 | Temp 103.0°F | Resp 18 | Ht 71.0 in | Wt 158.2 lb

## 2015-04-05 VITALS — BP 116/73 | HR 102 | Temp 99.1°F | Resp 18

## 2015-04-05 DIAGNOSIS — E876 Hypokalemia: Secondary | ICD-10-CM | POA: Diagnosis not present

## 2015-04-05 DIAGNOSIS — R509 Fever, unspecified: Secondary | ICD-10-CM

## 2015-04-05 DIAGNOSIS — C3490 Malignant neoplasm of unspecified part of unspecified bronchus or lung: Secondary | ICD-10-CM

## 2015-04-05 DIAGNOSIS — C3491 Malignant neoplasm of unspecified part of right bronchus or lung: Secondary | ICD-10-CM

## 2015-04-05 DIAGNOSIS — C801 Malignant (primary) neoplasm, unspecified: Secondary | ICD-10-CM | POA: Diagnosis not present

## 2015-04-05 LAB — CBC WITH DIFFERENTIAL/PLATELET
BASO%: 0.1 % (ref 0.0–2.0)
Basophils Absolute: 0 10*3/uL (ref 0.0–0.1)
EOS%: 0.4 % (ref 0.0–7.0)
Eosinophils Absolute: 0.1 10*3/uL (ref 0.0–0.5)
HCT: 38.7 % (ref 38.4–49.9)
HGB: 14 g/dL (ref 13.0–17.1)
LYMPH#: 0.6 10*3/uL — AB (ref 0.9–3.3)
LYMPH%: 4 % — ABNORMAL LOW (ref 14.0–49.0)
MCH: 38.3 pg — ABNORMAL HIGH (ref 27.2–33.4)
MCHC: 36.2 g/dL — AB (ref 32.0–36.0)
MCV: 105.7 fL — ABNORMAL HIGH (ref 79.3–98.0)
MONO#: 0.9 10*3/uL (ref 0.1–0.9)
MONO%: 6.2 % (ref 0.0–14.0)
NEUT#: 12.9 10*3/uL — ABNORMAL HIGH (ref 1.5–6.5)
NEUT%: 89.3 % — ABNORMAL HIGH (ref 39.0–75.0)
NRBC: 0 % (ref 0–0)
Platelets: 299 10*3/uL (ref 140–400)
RBC: 3.66 10*6/uL — AB (ref 4.20–5.82)
RDW: 17.1 % — AB (ref 11.0–14.6)
WBC: 14.5 10*3/uL — AB (ref 4.0–10.3)

## 2015-04-05 LAB — COMPREHENSIVE METABOLIC PANEL (CC13)
ALBUMIN: 4.1 g/dL (ref 3.5–5.0)
ALT: 18 U/L (ref 0–55)
ANION GAP: 15 meq/L — AB (ref 3–11)
AST: 29 U/L (ref 5–34)
Alkaline Phosphatase: 90 U/L (ref 40–150)
BUN: 12.6 mg/dL (ref 7.0–26.0)
CALCIUM: 8.8 mg/dL (ref 8.4–10.4)
CHLORIDE: 101 meq/L (ref 98–109)
CO2: 25 mEq/L (ref 22–29)
Creatinine: 0.9 mg/dL (ref 0.7–1.3)
EGFR: >90 mL/min/{1.73_m2} (ref >90)
Glucose: 111 mg/dl (ref 70–140)
POTASSIUM: 2.9 meq/L — AB (ref 3.5–5.1)
Sodium: 141 mEq/L (ref 136–145)
Total Bilirubin: 1.4 mg/dL — ABNORMAL HIGH (ref 0.20–1.20)
Total Protein: 6.9 g/dL (ref 6.4–8.3)

## 2015-04-05 LAB — HOLD TUBE, BLOOD BANK

## 2015-04-05 MED ORDER — LEVOFLOXACIN 500 MG PO TABS: 500.0000 mg | ORAL_TABLET | Freq: Every day | ORAL | Status: DC

## 2015-04-05 MED ORDER — ACETAMINOPHEN 325 MG PO TABS
ORAL_TABLET | ORAL | Status: AC
  Filled 2015-04-05: qty 2

## 2015-04-05 MED ORDER — POTASSIUM CHLORIDE 2 MEQ/ML IV SOLN
Freq: Once | INTRAVENOUS | Status: AC
  Administered 2015-04-05: 11:00:00 via INTRAVENOUS
  Filled 2015-04-05: qty 1000

## 2015-04-05 MED ORDER — ACETAMINOPHEN 325 MG PO TABS: 650.0000 mg | ORAL_TABLET | Freq: Once | ORAL | Status: AC

## 2015-04-05 MED ORDER — ACETAMINOPHEN 325 MG PO TABS
650.0000 mg | ORAL_TABLET | ORAL | Status: AC
  Administered 2015-04-05: 650 mg via ORAL

## 2015-04-05 NOTE — Progress Notes (Signed)
Pt instructed at time of discharge to pick up prescription for antibiotic on his way home from [facility] today and to start taking antibiotics today.  Pt states that he is feeling better after receiving IV fluids.  Pt states that he does have a thermometer at home and verbalized an understanding to call [facility] if his temperature remained 100.5 or greater tomorrow or if he is not able to tolerate any fluids or food.  Pt has no questions at this time.  Pt lives at home with his son.

## 2015-04-05 NOTE — Telephone Encounter (Signed)
S.w. Pt and advised on May appt

## 2015-04-05 NOTE — Progress Notes (Signed)
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  Genomic Alterations Identified? FGFR2 A97T KRAS Q61H - subclonal? ARID1A [id number]* - subclonal?  PRIOR THERAPY:  1) Status post right video-assisted thoracoscopy, drainage of pleural effusion, pleural biopsy, partial decortication, talc pleurodesis.under the care of Dr. Roxan Hockey on 06/23/2014. 2) Systemic chemotherapy with carboplatin for AUC of 5 and Alimta 500 mg/M2 every 3 weeks. He is status post 6 cycles, with stable disease after cycle #6.  CURRENT THERAPY: Maintenance chemotherapy with single agent Alimta 500 MG/M2 every 3 weeks. First dose 12/20/2014. He is status post 5 cycles.  INTERVAL HISTORY: Henry [name] 64 y.o. male returns to the clinic today for follow-up visit. The patient tolerated the last cycle of her systemic chemotherapy fairly well with no significant adverse effects. He presented today with generalized weakness and fatigue as well as diarrhea for few days and low-grade fever. The patient was supposed to start cycle #6 of his chemotherapy today but he is not feeling well. Has been on treatment with Lasix for lower extremity swelling. He has no nausea or vomiting. The patient denied having any significant chest pain, shortness of breath, cough or hemoptysis. He has no significant weight loss or night sweats.   MEDICAL HISTORY: Past Medical History  Diagnosis Date  . Hypertension   . GERD (gastroesophageal reflux disease)   . Hemochromatosis   . Hx of adenomatous colonic polyps   . Umbilical hernia     none since weight loss  . Diverticulosis   . Lumbar spondylosis     ankle  . Pleural effusion on right 12/16/13   per chest xray  . Shortness of breath     with  exertion  . Chronic pancreatitis   . IBS (irritable bowel syndrome)   . Lung cancer 06/2014  . 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  MEDICATIONS:  Current Outpatient Prescriptions  Medication Sig Dispense Refill  . folic acid (FOLVITE) 1 MG tablet Take 1 tablet (1 mg total) by mouth daily. 30 tablet 3  . furosemide (LASIX) 40 MG tablet Take 40 mg by mouth daily.  11  . ibuprofen (ADVIL,MOTRIN) 200 MG tablet Take 400 mg by mouth every 6 (six) hours as needed (Pain).    Marland Kitchen KLOR-CON M10 10 MEQ tablet Take 20 mEq by mouth daily.  5  . metoprolol succinate (TOPROL-XL) 50 MG 24 hr tablet Take 50 mg by mouth daily.  12  . oxyCODONE (OXY IR/ROXICODONE) 5 MG immediate release tablet Take 5 mg by mouth every 4 (four) hours as needed.  0  . potassium chloride SA (K-DUR,KLOR-CON) 20 MEQ tablet Take 1 tablet (20 mEq total) by mouth daily. 7 tablet 0  . Probiotic Product (ALIGN) 4 MG CAPS Take 4 mg by mouth daily.     . verapamil (VERELAN PM) 240 MG 24 hr capsule Take 240 mg by mouth daily.  11  . albuterol (PROVENTIL HFA;VENTOLIN HFA) 108 (90 BASE) MCG/ACT inhaler Inhale 1-2 puffs into the lungs every 4 (four) hours as needed for wheezing or shortness of breath.     . dexamethasone (DECADRON) 4 MG tablet 4 mg by mouth twice a day the day before, day of and  day after the chemotherapy every 3 weeks. (Patient not taking: Reported on 04/05/2015) 40 tablet 1  . fluconazole (DIFLUCAN) 100 MG tablet   0  . nebivolol (BYSTOLIC) 5 MG tablet Take 5 mg by mouth every evening.     . Pancrelipase, Lip-Prot-Amyl, [id number] UNITS TABS Take 2 tablets by mouth 3 (three) times daily with meals. (Patient not taking: Reported on 04/05/2015) 540 tablet 2  . prochlorperazine (COMPAZINE) 10 MG tablet Take 1 tablet (10 mg total) by mouth every 6 (six) hours as needed for nausea or vomiting. (Patient not taking: Reported on 04/05/2015) 30 tablet 0    . tamsulosin (FLOMAX) 0.4 MG CAPS capsule     . VOLTAREN 1 % GEL Apply 2 g topically 3 (three) times a week. Used on ankle     No current facility-administered medications for this visit.    SURGICAL HISTORY:  Past Surgical History  Procedure Laterality Date  . Lumbar laminectomy  1983  . Knee arthrsoscopy  2003    left  . Tonsillectomy  1962  . Eus N/A 04/07/2013    Procedure: UPPER ENDOSCOPIC ULTRASOUND (EUS) LINEAR;  Surgeon: [name], MD;  Location: [facility];  Service: Endoscopy;  Laterality: N/A;  . Video assisted thoracoscopy (vats)/decortication Right 06/22/2014    Procedure: VIDEO ASSISTED THORACOSCOPY (VATS)/DECORTICATION;  Surgeon: [name], MD;  Location: [facility];  Service: Thoracic;  Laterality: Right;  . Pleural effusion drainage Right 06/22/2014    Procedure: DRAINAGE OF PLEURAL EFFUSION;  Surgeon: [name], MD;  Location: [facility];  Service: Thoracic;  Laterality: Right;  . Eus N/A 07/20/2014    Procedure: UPPER ENDOSCOPIC ULTRASOUND (EUS) LINEAR;  Surgeon: [name], MD;  Location: [facility];  Service: Endoscopy;  Laterality: N/A;    REVIEW OF SYSTEMS:  Constitutional: positive for fatigue, fevers and malaise Eyes: negative Ears, nose, mouth, throat, and face: negative Respiratory: negative Cardiovascular: negative Gastrointestinal: positive for diarrhea Genitourinary:negative Integument/breast: negative Hematologic/lymphatic: negative Musculoskeletal:negative Neurological: negative Behavioral/Psych: negative Endocrine: negative Allergic/Immunologic: negative   PHYSICAL EXAMINATION: General appearance: alert, cooperative and no distress Head: Normocephalic, without obvious abnormality, atraumatic Neck: no adenopathy, no JVD, supple, symmetrical, trachea midline and thyroid not enlarged, symmetric, no tenderness/mass/nodules Lymph nodes: Cervical, supraclavicular, and axillary nodes normal. Resp: clear to auscultation  bilaterally Back: symmetric, no curvature. ROM normal. No CVA tenderness. Cardio: regular rate and rhythm, S1, S2 normal, no murmur, click, rub or gallop GI: soft, non-tender; bowel sounds normal; no masses,  no organomegaly Extremities: edema 1+ bilaterally Neurologic: Alert and oriented X 3, normal strength and tone. Normal symmetric reflexes. Normal coordination and gait  ECOG PERFORMANCE STATUS: 1 - Symptomatic but completely ambulatory  Blood pressure 162/83, pulse 103, temperature 103 F (39.4 C), temperature source Oral, resp. rate 18, height 5' 11"  (1.803 m), weight 158 lb 3.2 oz (71.759 kg), SpO2 94 %.  LABORATORY DATA: Lab Results  Component Value Date   WBC 14.5* 04/05/2015   HGB 14.0 04/05/2015   HCT 38.7 04/05/2015   MCV 105.7* 04/05/2015   PLT 299 04/05/2015      Chemistry      Component Value Date/Time   NA 146* 03/15/2015 0925   NA 140 06/24/2014 0346   K 3.4* 03/15/2015 0925   K 3.3* 06/24/2014 0346   CL 102 06/24/2014 0346   CO2 26 03/15/2015 0925   CO2 28 06/24/2014 0346   BUN 11.0 03/15/2015 0925   BUN 11 06/24/2014 0346   CREATININE 0.8 03/15/2015 0925  CREATININE 0.75 06/24/2014 0346      Component Value Date/Time   CALCIUM 8.5 03/15/2015 0925   CALCIUM 8.6 06/24/2014 0346   ALKPHOS 77 03/15/2015 0925   ALKPHOS 54 06/24/2014 0346   AST 26 03/15/2015 0925   AST 13 06/24/2014 0346   ALT 14 03/15/2015 0925   ALT 11 06/24/2014 0346   BILITOT 1.01 03/15/2015 0925   BILITOT 0.6 06/24/2014 0346       RADIOGRAPHIC STUDIES: No results found. ASSESSMENT AND PLAN: This is a very pleasant 64 years old white male with metastatic adenocarcinoma of unknown primary questionable for upper gastrointestinal, pancreatic or biliary or primary lung cancer. He completed systemic chemotherapy with carboplatin and Alimta status post 6 cycles and tolerating his treatment fairly well with a stable disease. He is currently undergoing maintenance chemotherapy with  single agent Alimta status post 5 cycles  I had a lengthy discussion with the patient today about his condition. I recommended for him to delay the start of cycle #6 of his chemotherapy by 1 week until he has improvement in his condition. For the fever of unknown etiology questionable for bronchitis, I will start the patient on Levaquin 500 mg by mouth daily for 7 days. I will also arrange for the patient to receive IV hydration with normal saline today. For the hypokalemia, I recommended for the patient to continue on K Dur 40 mEq by mouth daily for the next 7 days. He would come back for follow-up visit in 4 weeks for reevaluation after repeating CT scan of the chest, abdomen and pelvis for restaging of his disease after cycle #6. He was advised to call immediately if he has any concerning symptoms in the interval. The patient voices understanding of current disease status and treatment options and is in agreement with the current care plan.  All questions were answered. The patient knows to call the clinic with any problems, questions or concerns. We can certainly see the patient much sooner if necessary.  Disclaimer: This note was dictated with voice recognition software. Similar sounding words can inadvertently be transcribed and may not be corrected upon review.

## 2015-04-05 NOTE — Patient Instructions (Signed)
Dehydration, Adult Dehydration is when you lose more fluids from the body than you take in. Vital organs like the kidneys, brain, and heart cannot function without a proper amount of fluids and salt. Any loss of fluids from the body can cause dehydration.  CAUSES   Vomiting.  Diarrhea.  Excessive sweating.  Excessive urine output.  Fever. SYMPTOMS  Mild dehydration  Thirst.  Dry lips.  Slightly dry mouth. Moderate dehydration  Very dry mouth.  Sunken eyes.  Skin does not bounce back quickly when lightly pinched and released.  Dark urine and decreased urine production.  Decreased tear production.  Headache. Severe dehydration  Very dry mouth.  Extreme thirst.  Rapid, weak pulse (more than 100 beats per minute at rest).  Cold hands and feet.  Not able to sweat in spite of heat and temperature.  Rapid breathing.  Blue lips.  Confusion and lethargy.  Difficulty being awakened.  Minimal urine production.  No tears. DIAGNOSIS  Your caregiver will diagnose dehydration based on your symptoms and your exam. Blood and urine tests will help confirm the diagnosis. The diagnostic evaluation should also identify the cause of dehydration. TREATMENT  Treatment of mild or moderate dehydration can often be done at home by increasing the amount of fluids that you drink. It is best to drink small amounts of fluid more often. Drinking too much at one time can make vomiting worse. Refer to the home care instructions below. Severe dehydration needs to be treated at the hospital where you will probably be given intravenous (IV) fluids that contain water and electrolytes. HOME CARE INSTRUCTIONS   Ask your caregiver about specific rehydration instructions.  Drink enough fluids to keep your urine clear or pale yellow.  Drink small amounts frequently if you have nausea and vomiting.  Eat as you normally do.  Avoid:  Foods or drinks high in sugar.  Carbonated  drinks.  Juice.  Extremely hot or cold fluids.  Drinks with caffeine.  Fatty, greasy foods.  Alcohol.  Tobacco.  Overeating.  Gelatin desserts.  Wash your hands well to avoid spreading bacteria and viruses.  Only take over-the-counter or prescription medicines for pain, discomfort, or fever as directed by your caregiver.  Ask your caregiver if you should continue all prescribed and over-the-counter medicines.  Keep all follow-up appointments with your caregiver. SEEK MEDICAL CARE IF:  You have abdominal pain and it increases or stays in one area (localizes).  You have a rash, stiff neck, or severe headache.  You are irritable, sleepy, or difficult to awaken.  You are weak, dizzy, or extremely thirsty. SEEK IMMEDIATE MEDICAL CARE IF:   You are unable to keep fluids down or you get worse despite treatment.  You have frequent episodes of vomiting or diarrhea.  You have blood or green matter (bile) in your vomit.  You have blood in your stool or your stool looks black and tarry.  You have not urinated in 6 to 8 hours, or you have only urinated a small amount of very dark urine.  You have a fever.  You faint. MAKE SURE YOU:   Understand these instructions.  Will watch your condition.  Will get help right away if you are not doing well or get worse. Document Released: 11/03/2005 Document Revised: 01/26/2012 Document Reviewed: 06/23/2011 Exit[employer] Patient Information 2015 [employer]. This information is not intended to replace advice given to you by your health care provider. Make sure you discuss any questions you have with your health care   provider.  

## 2015-04-05 NOTE — Progress Notes (Signed)
Levaquin sent to wrong pharmacy so I sent it to [facility] per pt request.

## 2015-04-05 NOTE — Telephone Encounter (Signed)
SHOULD PT. KEEP HIS APPOINTMENT? PT. TO COME TO HIS APPOINTMENT. NOTIFIED PT. HE IS IN THE [facility] LOBBY.

## 2015-04-06 ENCOUNTER — Encounter: Payer: Self-pay | Admitting: Internal Medicine

## 2015-04-12 ENCOUNTER — Other Ambulatory Visit (HOSPITAL_BASED_OUTPATIENT_CLINIC_OR_DEPARTMENT_OTHER): Payer: BLUE CROSS/BLUE SHIELD

## 2015-04-12 ENCOUNTER — Ambulatory Visit (HOSPITAL_BASED_OUTPATIENT_CLINIC_OR_DEPARTMENT_OTHER): Payer: BLUE CROSS/BLUE SHIELD

## 2015-04-12 VITALS — BP 132/84 | HR 83 | Temp 98.8°F | Resp 18

## 2015-04-12 DIAGNOSIS — C3490 Malignant neoplasm of unspecified part of unspecified bronchus or lung: Secondary | ICD-10-CM

## 2015-04-12 DIAGNOSIS — C7951 Secondary malignant neoplasm of bone: Secondary | ICD-10-CM

## 2015-04-12 DIAGNOSIS — C801 Malignant (primary) neoplasm, unspecified: Secondary | ICD-10-CM

## 2015-04-12 DIAGNOSIS — Z5111 Encounter for antineoplastic chemotherapy: Secondary | ICD-10-CM

## 2015-04-12 DIAGNOSIS — E876 Hypokalemia: Secondary | ICD-10-CM | POA: Diagnosis not present

## 2015-04-12 DIAGNOSIS — C3491 Malignant neoplasm of unspecified part of right bronchus or lung: Secondary | ICD-10-CM

## 2015-04-12 LAB — CBC WITH DIFFERENTIAL/PLATELET
BASO%: 0.7 % (ref 0.0–2.0)
Basophils Absolute: 0.1 10*3/uL (ref 0.0–0.1)
EOS ABS: 0.2 10*3/uL (ref 0.0–0.5)
EOS%: 1.8 % (ref 0.0–7.0)
HCT: 37.6 % — ABNORMAL LOW (ref 38.4–49.9)
HGB: 13.2 g/dL (ref 13.0–17.1)
LYMPH%: 18.7 % (ref 14.0–49.0)
MCH: 37.6 pg — ABNORMAL HIGH (ref 27.2–33.4)
MCHC: 35.1 g/dL (ref 32.0–36.0)
MCV: 107.1 fL — ABNORMAL HIGH (ref 79.3–98.0)
MONO#: 1.6 10*3/uL — ABNORMAL HIGH (ref 0.1–0.9)
MONO%: 12.5 % (ref 0.0–14.0)
NEUT#: 8.2 10*3/uL — ABNORMAL HIGH (ref 1.5–6.5)
NEUT%: 66.3 % (ref 39.0–75.0)
PLATELETS: 274 10*3/uL (ref 140–400)
RBC: 3.51 10*6/uL — AB (ref 4.20–5.82)
RDW: 16 % — ABNORMAL HIGH (ref 11.0–14.6)
WBC: 12.4 10*3/uL — ABNORMAL HIGH (ref 4.0–10.3)
lymph#: 2.3 10*3/uL (ref 0.9–3.3)

## 2015-04-12 LAB — COMPREHENSIVE METABOLIC PANEL (CC13)
ALT: 16 U/L (ref 0–55)
AST: 27 U/L (ref 5–34)
Albumin: 3.2 g/dL — ABNORMAL LOW (ref 3.5–5.0)
Alkaline Phosphatase: 75 U/L (ref 40–150)
Anion Gap: 13 mEq/L — ABNORMAL HIGH (ref 3–11)
BUN: 12.4 mg/dL (ref 7.0–26.0)
CALCIUM: 9 mg/dL (ref 8.4–10.4)
CO2: 27 mEq/L (ref 22–29)
CREATININE: 0.8 mg/dL (ref 0.7–1.3)
Chloride: 103 mEq/L (ref 98–109)
Glucose: 149 mg/dl — ABNORMAL HIGH (ref 70–140)
Potassium: 3.4 mEq/L — ABNORMAL LOW (ref 3.5–5.1)
Sodium: 142 mEq/L (ref 136–145)
TOTAL PROTEIN: 6.4 g/dL (ref 6.4–8.3)
Total Bilirubin: 0.77 mg/dL (ref 0.20–1.20)

## 2015-04-12 LAB — TECHNOLOGIST REVIEW

## 2015-04-12 MED ORDER — SODIUM CHLORIDE 0.9 % IV SOLN
Freq: Once | INTRAVENOUS | Status: AC
  Administered 2015-04-12: 11:00:00 via INTRAVENOUS
  Filled 2015-04-12: qty 4

## 2015-04-12 MED ORDER — CYANOCOBALAMIN 1000 MCG/ML IJ SOLN
INTRAMUSCULAR | Status: AC
  Filled 2015-04-12: qty 1

## 2015-04-12 MED ORDER — PEMETREXED DISODIUM CHEMO INJECTION 500 MG
530.0000 mg/m2 | Freq: Once | INTRAVENOUS | Status: AC
  Administered 2015-04-12: 1000 mg via INTRAVENOUS
  Filled 2015-04-12: qty 40

## 2015-04-12 MED ORDER — SODIUM CHLORIDE 0.9 % IV SOLN
Freq: Once | INTRAVENOUS | Status: AC
  Administered 2015-04-12: 10:00:00 via INTRAVENOUS

## 2015-04-12 MED ORDER — CYANOCOBALAMIN 1000 MCG/ML IJ SOLN
1000.0000 ug | Freq: Once | INTRAMUSCULAR | Status: AC
  Administered 2015-04-12: 1000 ug via INTRAMUSCULAR

## 2015-04-12 NOTE — Patient Instructions (Signed)
[facility] Discharge Instructions for Patients Receiving Chemotherapy  Today you received the following chemotherapy agents: alimta  To help prevent nausea and vomiting after your treatment, we encourage you to take your nausea medication.  Take it as often as prescribed.     If you develop nausea and vomiting that is not controlled by your nausea medication, call the clinic. If it is after clinic hours your family physician or the after hours number for the clinic or go to the Emergency Department.   BELOW ARE SYMPTOMS THAT SHOULD BE REPORTED IMMEDIATELY:  *FEVER GREATER THAN 100.5 F  *CHILLS WITH OR WITHOUT FEVER  NAUSEA AND VOMITING THAT IS NOT CONTROLLED WITH YOUR NAUSEA MEDICATION  *UNUSUAL SHORTNESS OF BREATH  *UNUSUAL BRUISING OR BLEEDING  TENDERNESS IN MOUTH AND THROAT WITH OR WITHOUT PRESENCE OF ULCERS  *URINARY PROBLEMS  *BOWEL PROBLEMS  UNUSUAL RASH Items with * indicate a potential emergency and should be followed up as soon as possible.  Feel free to call the clinic you have any questions or concerns. The clinic phone number is [phone].   I have been informed and understand all the instructions given to me. I know to contact the clinic, my physician, or go to the Emergency Department if any problems should occur. I do not have any questions at this time, but understand that I may call the clinic during office hours   should I have any questions or need assistance in obtaining follow up care.    __________________________________________  _____________  __________ Signature of Patient or Authorized Representative            Date                   Time    __________________________________________ Nurse's Signature    

## 2015-04-13 ENCOUNTER — Telehealth: Payer: Self-pay | Admitting: Medical Oncology

## 2015-04-13 ENCOUNTER — Ambulatory Visit: Payer: BLUE CROSS/BLUE SHIELD | Admitting: Physician Assistant

## 2015-04-13 NOTE — Telephone Encounter (Signed)
He did not see Adrena today  . He thought he was supposed to see her yesterday . Per Julien Nordmann he does not need to come back until the next appt. He does not need weekly labs only on day of chemotherapy. He reports he is having increased dyspnea walking long distances and up stairs. He requested handicap application and to see if he needs oxygen . Per [facility] appt for  oxygen sat testing to see if he qualifies for home oxygen. Handicap application signed and ready for pick up on Monday.I faxed cmp to his PCP . Per Julien Nordmann I told pt his potasium dropped because he was on lasix. Mohamed notified that pt said he needs "washed blood " if he ever needs transfusion-" I saw Dr Beryle Beams in the past for hemachromatosis"

## 2015-04-18 ENCOUNTER — Other Ambulatory Visit: Payer: Self-pay | Admitting: Medical Oncology

## 2015-04-18 ENCOUNTER — Other Ambulatory Visit: Payer: Self-pay | Admitting: *Deleted

## 2015-04-18 NOTE — Progress Notes (Signed)
Per conversation with pt last week and his increasing SOB especially with exertion climbing stairs -onc tx request sent to add pt to [facility] June 2 and do oxygen sat testing.

## 2015-04-19 ENCOUNTER — Other Ambulatory Visit (HOSPITAL_BASED_OUTPATIENT_CLINIC_OR_DEPARTMENT_OTHER): Payer: BLUE CROSS/BLUE SHIELD

## 2015-04-19 ENCOUNTER — Ambulatory Visit (HOSPITAL_BASED_OUTPATIENT_CLINIC_OR_DEPARTMENT_OTHER): Payer: BLUE CROSS/BLUE SHIELD | Admitting: Nurse Practitioner

## 2015-04-19 VITALS — BP 140/84 | HR 85 | Temp 98.4°F | Resp 20 | Wt 160.9 lb

## 2015-04-19 DIAGNOSIS — C3491 Malignant neoplasm of unspecified part of right bronchus or lung: Secondary | ICD-10-CM

## 2015-04-19 DIAGNOSIS — F17218 Nicotine dependence, cigarettes, with other nicotine-induced disorders: Secondary | ICD-10-CM

## 2015-04-19 DIAGNOSIS — C801 Malignant (primary) neoplasm, unspecified: Secondary | ICD-10-CM | POA: Diagnosis not present

## 2015-04-19 DIAGNOSIS — E876 Hypokalemia: Secondary | ICD-10-CM

## 2015-04-19 DIAGNOSIS — R7981 Abnormal blood-gas level: Secondary | ICD-10-CM

## 2015-04-19 DIAGNOSIS — R05 Cough: Secondary | ICD-10-CM

## 2015-04-19 LAB — CBC WITH DIFFERENTIAL/PLATELET
BASO%: 1.1 % (ref 0.0–2.0)
Basophils Absolute: 0 10*3/uL (ref 0.0–0.1)
EOS%: 1.9 % (ref 0.0–7.0)
Eosinophils Absolute: 0.1 10*3/uL (ref 0.0–0.5)
HEMATOCRIT: 32.7 % — AB (ref 38.4–49.9)
HEMOGLOBIN: 11.3 g/dL — AB (ref 13.0–17.1)
LYMPH#: 1 10*3/uL (ref 0.9–3.3)
LYMPH%: 23.9 % (ref 14.0–49.0)
MCH: 37.6 pg — ABNORMAL HIGH (ref 27.2–33.4)
MCHC: 34.6 g/dL (ref 32.0–36.0)
MCV: 108.7 fL — AB (ref 79.3–98.0)
MONO#: 0.2 10*3/uL (ref 0.1–0.9)
MONO%: 5.5 % (ref 0.0–14.0)
NEUT#: 2.9 10*3/uL (ref 1.5–6.5)
NEUT%: 67.6 % (ref 39.0–75.0)
Platelets: 203 10*3/uL (ref 140–400)
RBC: 3.01 10*6/uL — ABNORMAL LOW (ref 4.20–5.82)
RDW: 15.4 % — AB (ref 11.0–14.6)
WBC: 4.3 10*3/uL (ref 4.0–10.3)

## 2015-04-19 LAB — COMPREHENSIVE METABOLIC PANEL (CC13)
ALT: 21 U/L (ref 0–55)
ANION GAP: 8 meq/L (ref 3–11)
AST: 29 U/L (ref 5–34)
Albumin: 3 g/dL — ABNORMAL LOW (ref 3.5–5.0)
Alkaline Phosphatase: 70 U/L (ref 40–150)
BILIRUBIN TOTAL: 0.86 mg/dL (ref 0.20–1.20)
BUN: 18.3 mg/dL (ref 7.0–26.0)
CHLORIDE: 104 meq/L (ref 98–109)
CO2: 29 mEq/L (ref 22–29)
Calcium: 8.5 mg/dL (ref 8.4–10.4)
Creatinine: 0.8 mg/dL (ref 0.7–1.3)
EGFR: >90 mL/min/{1.73_m2} (ref >90)
Glucose: 133 mg/dl (ref 70–140)
Potassium: 3.5 mEq/L (ref 3.5–5.1)
Sodium: 141 mEq/L (ref 136–145)
Total Protein: 5.8 g/dL — ABNORMAL LOW (ref 6.4–8.3)

## 2015-04-19 NOTE — Progress Notes (Signed)
Pt here to be evaluated for home oxygen necessity.  Resting o2 sat is 95%. Patient walked many laps around the office with oximeter. During ambulation pt noted to decrease sat to 87%. Patient respirations increased to 24 per minute.   He has a deep cough that bothers him mostly in the morning. He has a dark brown/black sputum only in the early morning. During the day this cough is non productive. Pt states "this chemo is making my shortness of breath and cough worse". He had physical therapy in the past that included some lung strengthening exercises and pt reports this was highly beneficial for him. He would consider this again if possible. He states he does not want to use o2 when he is active or working.    Pt reports he is a smoker and his usage has increased to about a pack a day. Pt was counseled for several minutes about the health benefits of quitting. He is interested in quitting but is hesitant about medication or "nicotine substitutes" as he is afraid of the additives. In the past he has tried Chantax with little results and adverse side effects. He has not tried anything else but would be interested in discussing this further. He has been considering hypnotherapy.    Pt voiced concern about a thyroidectomy he is going to be having. Pt states he is not able to get this surgery scheduled until he has a colonoscopy. This procedure has not been determined either.   Pt was given the DMV paperwork for handicap parking.

## 2015-04-25 ENCOUNTER — Other Ambulatory Visit: Payer: Self-pay | Admitting: Medical Oncology

## 2015-04-25 DIAGNOSIS — C3490 Malignant neoplasm of unspecified part of unspecified bronchus or lung: Secondary | ICD-10-CM

## 2015-04-26 ENCOUNTER — Telehealth: Payer: Self-pay | Admitting: Internal Medicine

## 2015-04-26 ENCOUNTER — Other Ambulatory Visit: Payer: BLUE CROSS/BLUE SHIELD

## 2015-04-26 ENCOUNTER — Other Ambulatory Visit: Payer: Self-pay | Admitting: Internal Medicine

## 2015-04-26 NOTE — Telephone Encounter (Signed)
pt called to cx appt...said he did not want to come and did not need lab today

## 2015-05-01 ENCOUNTER — Encounter (HOSPITAL_COMMUNITY): Payer: Self-pay

## 2015-05-01 ENCOUNTER — Ambulatory Visit (HOSPITAL_COMMUNITY)
Admission: RE | Admit: 2015-05-01 | Discharge: 2015-05-01 | Disposition: A | Discharge status: Home / Self Care | Payer: BLUE CROSS/BLUE SHIELD | Source: Ambulatory Visit | Attending: Internal Medicine | Admitting: Internal Medicine

## 2015-05-01 DIAGNOSIS — E876 Hypokalemia: Secondary | ICD-10-CM

## 2015-05-01 DIAGNOSIS — R05 Cough: Secondary | ICD-10-CM | POA: Diagnosis not present

## 2015-05-01 DIAGNOSIS — J90 Pleural effusion, not elsewhere classified: Secondary | ICD-10-CM | POA: Insufficient documentation

## 2015-05-01 DIAGNOSIS — Z08 Encounter for follow-up examination after completed treatment for malignant neoplasm: Secondary | ICD-10-CM | POA: Diagnosis not present

## 2015-05-01 DIAGNOSIS — Z79899 Other long term (current) drug therapy: Secondary | ICD-10-CM | POA: Diagnosis not present

## 2015-05-01 DIAGNOSIS — R0602 Shortness of breath: Secondary | ICD-10-CM | POA: Diagnosis not present

## 2015-05-01 DIAGNOSIS — C3490 Malignant neoplasm of unspecified part of unspecified bronchus or lung: Secondary | ICD-10-CM | POA: Insufficient documentation

## 2015-05-01 MED ORDER — IOHEXOL 300 MG/ML  SOLN
100.0000 mL | Freq: Once | INTRAMUSCULAR | Status: AC | PRN
  Administered 2015-05-01: 100 mL via INTRAVENOUS

## 2015-05-02 ENCOUNTER — Telehealth: Payer: Self-pay | Admitting: Medical Oncology

## 2015-05-02 NOTE — Telephone Encounter (Signed)
F/U phone call from on call note that Dr Gordy Levan wants to extract a tooth. Per Julien Nordmann it is okay to extract tooth today -pt needs to be on antibiotic for procedure .I left this message at Dr gills office.

## 2015-05-03 ENCOUNTER — Ambulatory Visit (HOSPITAL_BASED_OUTPATIENT_CLINIC_OR_DEPARTMENT_OTHER): Payer: BLUE CROSS/BLUE SHIELD | Admitting: Internal Medicine

## 2015-05-03 ENCOUNTER — Telehealth: Payer: Self-pay | Admitting: Medical Oncology

## 2015-05-03 ENCOUNTER — Ambulatory Visit (HOSPITAL_BASED_OUTPATIENT_CLINIC_OR_DEPARTMENT_OTHER): Payer: BLUE CROSS/BLUE SHIELD

## 2015-05-03 ENCOUNTER — Encounter: Payer: Self-pay | Admitting: Internal Medicine

## 2015-05-03 ENCOUNTER — Other Ambulatory Visit: Payer: Self-pay | Admitting: Medical Oncology

## 2015-05-03 ENCOUNTER — Telehealth: Payer: Self-pay | Admitting: Internal Medicine

## 2015-05-03 ENCOUNTER — Other Ambulatory Visit (HOSPITAL_BASED_OUTPATIENT_CLINIC_OR_DEPARTMENT_OTHER): Payer: BLUE CROSS/BLUE SHIELD

## 2015-05-03 VITALS — BP 149/94 | HR 86 | Temp 98.4°F | Resp 18 | Ht 71.0 in | Wt 162.6 lb

## 2015-05-03 DIAGNOSIS — C3490 Malignant neoplasm of unspecified part of unspecified bronchus or lung: Secondary | ICD-10-CM

## 2015-05-03 DIAGNOSIS — D61810 Antineoplastic chemotherapy induced pancytopenia: Secondary | ICD-10-CM

## 2015-05-03 DIAGNOSIS — E876 Hypokalemia: Secondary | ICD-10-CM

## 2015-05-03 DIAGNOSIS — Z5111 Encounter for antineoplastic chemotherapy: Secondary | ICD-10-CM

## 2015-05-03 DIAGNOSIS — C801 Malignant (primary) neoplasm, unspecified: Secondary | ICD-10-CM

## 2015-05-03 DIAGNOSIS — C3491 Malignant neoplasm of unspecified part of right bronchus or lung: Secondary | ICD-10-CM

## 2015-05-03 DIAGNOSIS — Z86010 Personal history of colonic polyps: Secondary | ICD-10-CM

## 2015-05-03 LAB — COMPREHENSIVE METABOLIC PANEL (CC13)
ALT: 21 U/L (ref 0–55)
AST: 30 U/L (ref 5–34)
Albumin: 3.5 g/dL (ref 3.5–5.0)
Alkaline Phosphatase: 81 U/L (ref 40–150)
Anion Gap: 7 mEq/L (ref 3–11)
BUN: 12 mg/dL (ref 7.0–26.0)
CALCIUM: 8.8 mg/dL (ref 8.4–10.4)
CO2: 28 mEq/L (ref 22–29)
CREATININE: 0.8 mg/dL (ref 0.7–1.3)
Chloride: 107 mEq/L (ref 98–109)
GLUCOSE: 134 mg/dL (ref 70–140)
Potassium: 3.2 mEq/L — ABNORMAL LOW (ref 3.5–5.1)
Sodium: 143 mEq/L (ref 136–145)
Total Bilirubin: 1.1 mg/dL (ref 0.20–1.20)
Total Protein: 6.3 g/dL — ABNORMAL LOW (ref 6.4–8.3)

## 2015-05-03 LAB — CBC WITH DIFFERENTIAL/PLATELET
BASO%: 0.5 % (ref 0.0–2.0)
Basophils Absolute: 0 10*3/uL (ref 0.0–0.1)
EOS%: 5.2 % (ref 0.0–7.0)
Eosinophils Absolute: 0.4 10*3/uL (ref 0.0–0.5)
HCT: 41 % (ref 38.4–49.9)
HGB: 14.1 g/dL (ref 13.0–17.1)
LYMPH#: 1.7 10*3/uL (ref 0.9–3.3)
LYMPH%: 22.4 % (ref 14.0–49.0)
MCH: 36.9 pg — ABNORMAL HIGH (ref 27.2–33.4)
MCHC: 34.4 g/dL (ref 32.0–36.0)
MCV: 107.3 fL — ABNORMAL HIGH (ref 79.3–98.0)
MONO#: 0.9 10*3/uL (ref 0.1–0.9)
MONO%: 12.6 % (ref 0.0–14.0)
NEUT#: 4.4 10*3/uL (ref 1.5–6.5)
NEUT%: 59.3 % (ref 39.0–75.0)
PLATELETS: 244 10*3/uL (ref 140–400)
RBC: 3.82 10*6/uL — AB (ref 4.20–5.82)
RDW: 16.1 % — ABNORMAL HIGH (ref 11.0–14.6)
WBC: 7.4 10*3/uL (ref 4.0–10.3)

## 2015-05-03 MED ORDER — SODIUM CHLORIDE 0.9 % IV SOLN
Freq: Once | INTRAVENOUS | Status: AC
  Administered 2015-05-03: 10:00:00 via INTRAVENOUS

## 2015-05-03 MED ORDER — SODIUM CHLORIDE 0.9 % IV SOLN
Freq: Once | INTRAVENOUS | Status: AC
  Administered 2015-05-03: 10:00:00 via INTRAVENOUS
  Filled 2015-05-03: qty 4

## 2015-05-03 MED ORDER — SODIUM CHLORIDE 0.9 % IV SOLN
530.0000 mg/m2 | Freq: Once | INTRAVENOUS | Status: AC
  Administered 2015-05-03: 1000 mg via INTRAVENOUS
  Filled 2015-05-03: qty 40

## 2015-05-03 NOTE — Patient Instructions (Signed)
Smoking Cessation Quitting smoking is important to your health and has many advantages. However, it is not always easy to quit since nicotine is a very addictive drug. Oftentimes, people try 3 times or more before being able to quit. This document explains the best ways for you to prepare to quit smoking. Quitting takes hard work and a lot of effort, but you can do it. ADVANTAGES OF QUITTING SMOKING  You will live longer, feel better, and live better.  Your body will feel the impact of quitting smoking almost immediately.  Within 20 minutes, blood pressure decreases. Your pulse returns to its normal level.  After 8 hours, carbon monoxide levels in the blood return to normal. Your oxygen level increases.  After 24 hours, the chance of having a heart attack starts to decrease. Your breath, hair, and body stop smelling like smoke.  After 48 hours, damaged nerve endings begin to recover. Your sense of taste and smell improve.  After 72 hours, the body is virtually free of nicotine. Your bronchial tubes relax and breathing becomes easier.  After 2 to 12 weeks, lungs can hold more air. Exercise becomes easier and circulation improves.  The risk of having a heart attack, stroke, cancer, or lung disease is greatly reduced.  After 1 year, the risk of coronary heart disease is cut in half.  After 5 years, the risk of stroke falls to the same as a nonsmoker.  After 10 years, the risk of lung cancer is cut in half and the risk of other cancers decreases significantly.  After 15 years, the risk of coronary heart disease drops, usually to the level of a nonsmoker.  If you are pregnant, quitting smoking will improve your chances of having a healthy baby.  The people you live with, especially any children, will be healthier.  You will have extra money to spend on things other than cigarettes. QUESTIONS TO THINK ABOUT BEFORE ATTEMPTING TO QUIT You may want to talk about your answers with your  health care provider.  Why do you want to quit?  If you tried to quit in the past, what helped and what did not?  What will be the most difficult situations for you after you quit? How will you plan to handle them?  Who can help you through the tough times? Your family? Friends? A health care provider?  What pleasures do you get from smoking? What ways can you still get pleasure if you quit? Here are some questions to ask your health care provider:  How can you help me to be successful at quitting?  What medicine do you think would be best for me and how should I take it?  What should I do if I need more help?  What is smoking withdrawal like? How can I get information on withdrawal? GET READY  Set a quit date.  Change your environment by getting rid of all cigarettes, ashtrays, matches, and lighters in your home, car, or work. Do not let people smoke in your home.  Review your past attempts to quit. Think about what worked and what did not. GET SUPPORT AND ENCOURAGEMENT You have a better chance of being successful if you have help. You can get support in many ways.  Tell your family, friends, and coworkers that you are going to quit and need their support. Ask them not to smoke around you.  Get individual, group, or telephone counseling and support. Programs are available at local hospitals and health centers. Call   your local health department for information about programs in your area.  Spiritual beliefs and practices may help some smokers quit.  Download a "quit meter" on your computer to keep track of quit statistics, such as how long you have gone without smoking, cigarettes not smoked, and money saved.  Get a self-help book about quitting smoking and staying off tobacco. LEARN NEW SKILLS AND BEHAVIORS  Distract yourself from urges to smoke. Talk to someone, go for a walk, or occupy your time with a task.  Change your normal routine. Take a different route to work.  Drink tea instead of coffee. Eat breakfast in a different place.  Reduce your stress. Take a hot bath, exercise, or read a book.  Plan something enjoyable to do every day. Reward yourself for not smoking.  Explore interactive web-based programs that specialize in helping you quit. GET MEDICINE AND USE IT CORRECTLY Medicines can help you stop smoking and decrease the urge to smoke. Combining medicine with the above behavioral methods and support can greatly increase your chances of successfully quitting smoking.  Nicotine replacement therapy helps deliver nicotine to your body without the negative effects and risks of smoking. Nicotine replacement therapy includes nicotine gum, lozenges, inhalers, nasal sprays, and skin patches. Some may be available over-the-counter and others require a prescription.  Antidepressant medicine helps people abstain from smoking, but how this works is unknown. This medicine is available by prescription.  Nicotinic receptor partial agonist medicine simulates the effect of nicotine in your brain. This medicine is available by prescription. Ask your health care provider for advice about which medicines to use and how to use them based on your health history. Your health care provider will tell you what side effects to look out for if you choose to be on a medicine or therapy. Carefully read the information on the package. Do not use any other product containing nicotine while using a nicotine replacement product.  RELAPSE OR DIFFICULT SITUATIONS Most relapses occur within the first 3 months after quitting. Do not be discouraged if you start smoking again. Remember, most people try several times before finally quitting. You may have symptoms of withdrawal because your body is used to nicotine. You may crave cigarettes, be irritable, feel very hungry, cough often, get headaches, or have difficulty concentrating. The withdrawal symptoms are only temporary. They are strongest  when you first quit, but they will go away within 10-14 days. To reduce the chances of relapse, try to:  Avoid drinking alcohol. Drinking lowers your chances of successfully quitting.  Reduce the amount of caffeine you consume. Once you quit smoking, the amount of caffeine in your body increases and can give you symptoms, such as a rapid heartbeat, sweating, and anxiety.  Avoid smokers because they can make you want to smoke.  Do not let weight gain distract you. Many smokers will gain weight when they quit, usually less than 10 pounds. Eat a healthy diet and stay active. You can always lose the weight gained after you quit.  Find ways to improve your mood other than smoking. FOR MORE INFORMATION  www.smokefree.gov  Document Released: 10/28/2001 Document Revised: 03/20/2014 Document Reviewed: 02/12/2012 Exit[employer] Patient Information 2015 [employer]. This information is not intended to replace advice given to you by your health care provider. Make sure you discuss any questions you have with your health care provider.  

## 2015-05-03 NOTE — Telephone Encounter (Signed)
Pt notified . He is already taking 20 meq /day so I Instructed him to increase his dose BY 20 MEQ /DAY X 7 DAYS. i also told pt that I left a message with Dr Gordy Levan about scheduling your tooth extraction and regardign his oxygen he should be hearing from [facility] .

## 2015-05-03 NOTE — Progress Notes (Signed)
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  Genomic Alterations Identified? FGFR2 A97T KRAS Q61H - subclonal? ARID1A [id number]* - subclonal?  PRIOR THERAPY:  1) Status post right video-assisted thoracoscopy, drainage of pleural effusion, pleural biopsy, partial decortication, talc pleurodesis.under the care of Dr. Roxan Hockey on 06/23/2014. 2) Systemic chemotherapy with carboplatin for AUC of 5 and Alimta 500 mg/M2 every 3 weeks. He is status post 6 cycles, with stable disease after cycle #6.  CURRENT THERAPY: Maintenance chemotherapy with single agent Alimta 500 MG/M2 every 3 weeks. First dose 12/20/2014. He is status post 6 cycles.  INTERVAL HISTORY: Henry [name] 64 y.o. male returns to the clinic today for follow-up visit. The patient tolerated the last cycle of her systemic chemotherapy fairly well with no significant adverse effects. He has no nausea or vomiting. The patient denied having any significant chest pain, shortness of breath, cough or hemoptysis. He has no significant weight loss or night sweats. He'll need some dental work and he was asking about the appropriate time for this procedure. He also has an appointment with Gen. surgery for evaluation of his thyroid neoplasm next week. The patient had repeat CT scan of the chest, abdomen and pelvis performed recently and he is here for evaluation and discussion of his scan results.  MEDICAL HISTORY: Past Medical History  Diagnosis Date  . Hypertension   . GERD (gastroesophageal reflux disease)   . Hemochromatosis   . Hx of adenomatous colonic polyps   . Umbilical hernia     none since weight loss  . Diverticulosis   . Lumbar  spondylosis     ankle  . Pleural effusion on right 12/16/13    per chest xray  . Shortness of breath     with  exertion  . Chronic pancreatitis   . IBS (irritable bowel syndrome)   . Lung cancer 06/2014  . 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  MEDICATIONS:  Current Outpatient Prescriptions  Medication Sig Dispense Refill  . albuterol (PROVENTIL HFA;VENTOLIN HFA) 108 (90 BASE) MCG/ACT inhaler Inhale 1-2 puffs into the lungs every 4 (four) hours as needed for wheezing or shortness of breath.     . dexamethasone (DECADRON) 4 MG tablet 4 mg by mouth twice a day the day before, day of and day after the chemotherapy every 3 weeks. (Patient not taking: Reported on 04/05/2015) 40 tablet 1  . fluconazole (DIFLUCAN) 100 MG tablet   0  . folic acid (FOLVITE) 1 MG tablet Take 1 tablet (1 mg total) by mouth daily. 30 tablet 3  . folic acid (FOLVITE) 1 MG tablet TAKE 1 TABLET (1 MG TOTAL) BY MOUTH DAILY. 30 tablet 3  . furosemide (LASIX) 40 MG tablet Take 40 mg by mouth daily.  11  . ibuprofen (ADVIL,MOTRIN) 200 MG tablet Take 400 mg by mouth every 6 (six) hours as needed (Pain).    Marland Kitchen KLOR-CON M10 10 MEQ tablet Take 20 mEq by mouth daily.  5  . levofloxacin (LEVAQUIN) 500 MG tablet Take 1 tablet (500 mg total) by mouth daily. 7 tablet 0  . metoprolol succinate (TOPROL-XL) 50 MG 24 hr tablet Take 50 mg by mouth daily.  12  .  nebivolol (BYSTOLIC) 5 MG tablet Take 5 mg by mouth every evening.     Marland Kitchen oxyCODONE (OXY IR/ROXICODONE) 5 MG immediate release tablet Take 5 mg by mouth every 4 (four) hours as needed.  0  . Pancrelipase, Lip-Prot-Amyl, 20880 UNITS TABS Take 2 tablets by mouth 3 (three) times daily with meals. (Patient not taking: Reported on 04/05/2015) 540 tablet 2  . potassium chloride SA (K-DUR,KLOR-CON) 20 MEQ tablet Take 1 tablet (20 mEq total) by mouth daily. 7 tablet 0  . Probiotic Product (ALIGN) 4 MG CAPS Take 4 mg by mouth daily.     .  prochlorperazine (COMPAZINE) 10 MG tablet Take 1 tablet (10 mg total) by mouth every 6 (six) hours as needed for nausea or vomiting. (Patient not taking: Reported on 04/05/2015) 30 tablet 0  . tamsulosin (FLOMAX) 0.4 MG CAPS capsule     . verapamil (VERELAN PM) 240 MG 24 hr capsule Take 240 mg by mouth daily.  11  . VOLTAREN 1 % GEL Apply 2 g topically 3 (three) times a week. Used on ankle     No current facility-administered medications for this visit.    SURGICAL HISTORY:  Past Surgical History  Procedure Laterality Date  . Lumbar laminectomy  1983  . Knee arthrsoscopy  2003    left  . Tonsillectomy  1962  . Eus N/A 04/07/2013    Procedure: UPPER ENDOSCOPIC ULTRASOUND (EUS) LINEAR;  Surgeon: [name], MD;  Location: [facility];  Service: Endoscopy;  Laterality: N/A;  . Video assisted thoracoscopy (vats)/decortication Right 06/22/2014    Procedure: VIDEO ASSISTED THORACOSCOPY (VATS)/DECORTICATION;  Surgeon: [name], MD;  Location: [facility];  Service: Thoracic;  Laterality: Right;  . Pleural effusion drainage Right 06/22/2014    Procedure: DRAINAGE OF PLEURAL EFFUSION;  Surgeon: [name], MD;  Location: [facility];  Service: Thoracic;  Laterality: Right;  . Eus N/A 07/20/2014    Procedure: UPPER ENDOSCOPIC ULTRASOUND (EUS) LINEAR;  Surgeon: [name], MD;  Location: [facility];  Service: Endoscopy;  Laterality: N/A;    REVIEW OF SYSTEMS:  Constitutional: positive for fatigue, fevers and malaise Eyes: negative Ears, nose, mouth, throat, and face: negative Respiratory: negative Cardiovascular: negative Gastrointestinal: positive for diarrhea Genitourinary:negative Integument/breast: negative Hematologic/lymphatic: negative Musculoskeletal:negative Neurological: negative Behavioral/Psych: negative Endocrine: negative Allergic/Immunologic: negative   PHYSICAL EXAMINATION: General appearance: alert, cooperative and no distress Head: Normocephalic, without  obvious abnormality, atraumatic Neck: no adenopathy, no JVD, supple, symmetrical, trachea midline and thyroid not enlarged, symmetric, no tenderness/mass/nodules Lymph nodes: Cervical, supraclavicular, and axillary nodes normal. Resp: clear to auscultation bilaterally Back: symmetric, no curvature. ROM normal. No CVA tenderness. Cardio: regular rate and rhythm, S1, S2 normal, no murmur, click, rub or gallop GI: soft, non-tender; bowel sounds normal; no masses,  no organomegaly Extremities: edema 1+ bilaterally Neurologic: Alert and oriented X 3, normal strength and tone. Normal symmetric reflexes. Normal coordination and gait  ECOG PERFORMANCE STATUS: 1 - Symptomatic but completely ambulatory  Blood pressure 149/94, pulse 86, temperature 98.4 F (36.9 C), temperature source Oral, resp. rate 18, height 5' 11"  (1.803 m), weight 162 lb 9.6 oz (73.755 kg), SpO2 96 %.  LABORATORY DATA: Lab Results  Component Value Date   WBC 7.4 05/03/2015   HGB 14.1 05/03/2015   HCT 41.0 05/03/2015   MCV 107.3* 05/03/2015   PLT 244 05/03/2015      Chemistry      Component Value Date/Time   NA 141 04/19/2015 1123   NA 140  06/24/2014 0346   K 3.5 04/19/2015 1123   K 3.3* 06/24/2014 0346   CL 102 06/24/2014 0346   CO2 29 04/19/2015 1123   CO2 28 06/24/2014 0346   BUN 18.3 04/19/2015 1123   BUN 11 06/24/2014 0346   CREATININE 0.8 04/19/2015 1123   CREATININE 0.75 06/24/2014 0346      Component Value Date/Time   CALCIUM 8.5 04/19/2015 1123   CALCIUM 8.6 06/24/2014 0346   ALKPHOS 70 04/19/2015 1123   ALKPHOS 54 06/24/2014 0346   AST 29 04/19/2015 1123   AST 13 06/24/2014 0346   ALT 21 04/19/2015 1123   ALT 11 06/24/2014 0346   BILITOT 0.86 04/19/2015 1123   BILITOT 0.6 06/24/2014 0346       RADIOGRAPHIC STUDIES: Ct Chest W Contrast  05/01/2015   CLINICAL DATA:  Lung cancer diagnosed 06/2014, chemotherapy in progress. Cough, shortness breath.  EXAM: CT CHEST, ABDOMEN, AND PELVIS WITH  CONTRAST  TECHNIQUE: Multidetector CT imaging of the chest, abdomen and pelvis was performed following the standard protocol during bolus administration of intravenous contrast.  CONTRAST:  119m OMNIPAQUE IOHEXOL 300 MG/ML  SOLN  COMPARISON:  02/20/2015  FINDINGS: CT CHEST FINDINGS  Mediastinum/Nodes: The heart is normal in size. No pericardial effusion.  Coronary atherosclerosis.  Atherosclerotic calcifications of the aortic arch.  Mildly prominent thoracic lymph nodes, grossly unchanged, including:  --9 mm short axis right supraclavicular node (series 2/image 4)  --9 mm short axis right axillary node (series 2/image 15)  --7 mm short axis high right paratracheal node (series 2/ image 17)  --7 mm short axis prevascular node (series 2/ image 22)  --11 mm short axis subcarinal node (series 2/ image 29)  Visualized left thyroid is mildly enlarged/ nodular.  Lungs/Pleura: Mild residual scarring in the lateral left upper lobe (series 5/ image 15).  Patchy opacity/consolidation in the right middle lobe (series 5/ image 42) and right lung base (series 5/ image 46), unchanged.  Status post talc pleurodesis with stable chronic moderate right pleural effusion, loculated, with resolution of prior pneumothorax component. Trace left pleural effusion.  Musculoskeletal: Degenerative changes of the thoracic spine.  CT ABDOMEN PELVIS FINDINGS  Hepatobiliary: Scattered hepatic cysts. No suspicious/enhancing hepatic lesions.  Gallbladder is unremarkable. No intrahepatic or extrahepatic ductal dilatation.  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 No associated pancreatic atrophy or pancreatic ductal dilatation.  Spleen: Within normal limits.  Adrenals/Urinary Tract: Adrenal glands are within normal limits.  Kidneys within normal limits.  No hydronephrosis.  Bladder is mildly thick-walled.  Stomach/Bowel: Stomach is within normal  limits.  No evidence of bowel obstruction.  Three polypoid lesions in the right colon (series 2/ images 86 and 89), all of which measure 1.5-2.0 cm and at least one of which has a long stalk, suspicious for tubulovillous adenomas.  Vascular/Lymphatic: Atherosclerotic calcifications of the abdominal aorta and branch vessels.  Small retroperitoneal lymph nodes which do not meet pathologic CT size criteria.  Reproductive: Prostatomegaly, with enlargement of the central gland which indents the base of the bladder.  Other: Small volume abdominopelvic ascites.  Musculoskeletal: Stable sclerotic metastasis at L1 (sagittal image 58).  Degenerative changes of the lumbar spine, most prominent at L4-5.  IMPRESSION: Prior cavitary left upper lobe nodule nodule has essentially resolved with mild residual scarring.  Status post talc pleurodesis in the right hemithorax with moderate complex/loculated right pleural effusion. Associated patchy opacity/consolidation in the  right middle and lower lobes, chronic.  Borderline enlarged thoracic lymph nodes, grossly unchanged.  No evidence of metastatic disease in the abdomen/ pelvis.  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 Consider colonoscopy as clinically warranted.   Electronically Signed   By: Julian Hy M.D.   On: 05/01/2015 13:55   Ct Abdomen Pelvis W Contrast  05/01/2015   CLINICAL DATA:  Lung cancer diagnosed 06/2014, chemotherapy in progress. Cough, shortness breath.  EXAM: CT CHEST, ABDOMEN, AND PELVIS WITH CONTRAST  TECHNIQUE: Multidetector CT imaging of the chest, abdomen and pelvis was performed following the standard protocol during bolus administration of intravenous contrast.  CONTRAST:  133m OMNIPAQUE IOHEXOL 300 MG/ML  SOLN  COMPARISON:  02/20/2015  FINDINGS: CT CHEST FINDINGS  Mediastinum/Nodes: The heart is normal in size. No pericardial effusion.  Coronary atherosclerosis.  Atherosclerotic calcifications of the  aortic arch.  Mildly prominent thoracic lymph nodes, grossly unchanged, including:  --9 mm short axis right supraclavicular node (series 2/image 4)  --9 mm short axis right axillary node (series 2/image 15)  --7 mm short axis high right paratracheal node (series 2/ image 17)  --7 mm short axis prevascular node (series 2/ image 22)  --11 mm short axis subcarinal node (series 2/ image 29)  Visualized left thyroid is mildly enlarged/ nodular.  Lungs/Pleura: Mild residual scarring in the lateral left upper lobe (series 5/ image 15).  Patchy opacity/consolidation in the right middle lobe (series 5/ image 42) and right lung base (series 5/ image 46), unchanged.  Status post talc pleurodesis with stable chronic moderate right pleural effusion, loculated, with resolution of prior pneumothorax component. Trace left pleural effusion.  Musculoskeletal: Degenerative changes of the thoracic spine.  CT ABDOMEN PELVIS FINDINGS  Hepatobiliary: Scattered hepatic cysts. No suspicious/enhancing hepatic lesions.  Gallbladder is unremarkable. No intrahepatic or extrahepatic ductal dilatation.  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 No associated pancreatic atrophy or pancreatic ductal dilatation.  Spleen: Within normal limits.  Adrenals/Urinary Tract: Adrenal glands are within normal limits.  Kidneys within normal limits.  No hydronephrosis.  Bladder is mildly thick-walled.  Stomach/Bowel: Stomach is within normal limits.  No evidence of bowel obstruction.  Three polypoid lesions in the right colon (series 2/ images 86 and 89), all of which measure 1.5-2.0 cm and at least one of which has a long stalk, suspicious for tubulovillous adenomas.  Vascular/Lymphatic: Atherosclerotic calcifications of the abdominal aorta and branch vessels.  Small retroperitoneal lymph nodes which do not meet pathologic CT size criteria.  Reproductive:  Prostatomegaly, with enlargement of the central gland which indents the base of the bladder.  Other: Small volume abdominopelvic ascites.  Musculoskeletal: Stable sclerotic metastasis at L1 (sagittal image 58).  Degenerative changes of the lumbar spine, most prominent at L4-5.  IMPRESSION: Prior cavitary left upper lobe nodule nodule has essentially resolved with mild residual scarring.  Status post talc pleurodesis in the right hemithorax with moderate complex/loculated right pleural effusion. Associated patchy opacity/consolidation in the right middle and lower lobes, chronic.  Borderline enlarged thoracic lymph nodes, grossly unchanged.  No evidence of metastatic disease in the abdomen/ pelvis.  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 Consider colonoscopy as clinically warranted.   Electronically Signed   By: SJulian HyM.D.   On: 05/01/2015 13:55   ASSESSMENT AND PLAN: This is a very pleasant 63  years old white male with metastatic adenocarcinoma of unknown primary questionable for upper gastrointestinal, pancreatic or biliary or primary lung cancer. He completed systemic chemotherapy with carboplatin and Alimta status post 6 cycles and tolerating his treatment fairly well with a stable disease. He is currently undergoing maintenance chemotherapy with single agent Alimta status post 6 cycles  His recent CT scan of the chest, abdomen and pelvis showed further improvement in his disease. I discussed the scan results with the patient today and recommended for him to continue on his maintenance treatment with single agent Alimta every 3 weeks as scheduled. He will receive cycle #7 today. He would come back for follow-up visit in 3 weeks for reevaluation before starting cycle #8. For the hypokalemia, I recommended for the patient to continue on K Dur 20 mEq by mouth daily for the next 7 days. Regarding his dental procedure, I advised the patient to direct the  week before his next cycle of chemotherapy and to have CBC performed for evaluation before the procedure to rule out any chemotherapy-induced pancytopenia. Regarding the right colon finding, I recommended for the patient to see his gastroenterologist Dr. Paulita Fujita for evaluation and he is scheduled to see him in the next few weeks. He was advised to call immediately if he has any concerning symptoms in the interval. The patient voices understanding of current disease status and treatment options and is in agreement with the current care plan.  All questions were answered. The patient knows to call the clinic with any problems, questions or concerns. We can certainly see the patient much sooner if necessary.  Disclaimer: This note was dictated with voice recognition software. Similar sounding words can inadvertently be transcribed and may not be corrected upon review.

## 2015-05-03 NOTE — Telephone Encounter (Signed)
lvm for pt regarding to JULY appt....pt will get print out from chemo

## 2015-05-03 NOTE — Telephone Encounter (Signed)
Dental extraction scheduled for June 28th and lab the day before -pt notified.

## 2015-05-03 NOTE — Telephone Encounter (Signed)
-----   Message from Curt Bears, MD sent at 05/03/2015 12:24 PM EDT ----- Call patient with the result and Rx K Dur 20 meq po qd X 7 days

## 2015-05-03 NOTE — Telephone Encounter (Signed)
I left a message for Dr Gordy Levan , DDS that per Henry [name] pt can schedule tooth extraction for two weeks from now. He will need lab appt with [facility] the day before.Requested call back for further questions.

## 2015-05-03 NOTE — Progress Notes (Signed)
Quick Note:  Call patient with the result and Rx K Dur 20 meq po qd X 7 days ______

## 2015-05-03 NOTE — Telephone Encounter (Signed)
He has not heard back to see if he qualifies for oxygen from several weeks ago.

## 2015-05-03 NOTE — Patient Instructions (Signed)
[facility] Discharge Instructions for Patients Receiving Chemotherapy  Today you received the following chemotherapy agents Alimta.  To help prevent nausea and vomiting after your treatment, we encourage you to take your nausea medication as prescribed by your physician. If you develop nausea and vomiting that is not controlled by your nausea medication, call the clinic.   BELOW ARE SYMPTOMS THAT SHOULD BE REPORTED IMMEDIATELY:  *FEVER GREATER THAN 100.5 F  *CHILLS WITH OR WITHOUT FEVER  NAUSEA AND VOMITING THAT IS NOT CONTROLLED WITH YOUR NAUSEA MEDICATION  *UNUSUAL SHORTNESS OF BREATH  *UNUSUAL BRUISING OR BLEEDING  TENDERNESS IN MOUTH AND THROAT WITH OR WITHOUT PRESENCE OF ULCERS  *URINARY PROBLEMS  *BOWEL PROBLEMS  UNUSUAL RASH Items with * indicate a potential emergency and should be followed up as soon as possible.  Feel free to call the clinic you have any questions or concerns. The clinic phone number is [phone].  Please show the CHEMO ALERT CARD at check-in to the Emergency Department and triage nurse.   

## 2015-05-05 DIAGNOSIS — Z5111 Encounter for antineoplastic chemotherapy: Secondary | ICD-10-CM | POA: Insufficient documentation

## 2015-05-09 ENCOUNTER — Ambulatory Visit: Payer: Self-pay | Admitting: Surgery

## 2015-05-10 ENCOUNTER — Other Ambulatory Visit: Payer: Self-pay | Admitting: *Deleted

## 2015-05-10 ENCOUNTER — Telehealth: Payer: Self-pay | Admitting: *Deleted

## 2015-05-10 MED ORDER — UNABLE TO FIND: Status: DC

## 2015-05-10 NOTE — Telephone Encounter (Signed)
Voicemail from [place] with [facility] asking for return call in reference to a referral for Home Oxygen.  Return number [phone] ext [phone].

## 2015-05-11 NOTE — Telephone Encounter (Signed)
05-11-2015 1300 called [facility] to ensure referral okay.  Henry [name] is off today and staff has no access to his voicemail.  Staff will leave Henry [name] a message to F/u on this request.

## 2015-05-14 ENCOUNTER — Other Ambulatory Visit (HOSPITAL_BASED_OUTPATIENT_CLINIC_OR_DEPARTMENT_OTHER): Payer: BLUE CROSS/BLUE SHIELD

## 2015-05-14 ENCOUNTER — Telehealth: Payer: Self-pay | Admitting: Medical Oncology

## 2015-05-14 DIAGNOSIS — C801 Malignant (primary) neoplasm, unspecified: Secondary | ICD-10-CM | POA: Diagnosis not present

## 2015-05-14 DIAGNOSIS — C3490 Malignant neoplasm of unspecified part of unspecified bronchus or lung: Secondary | ICD-10-CM

## 2015-05-14 DIAGNOSIS — E876 Hypokalemia: Secondary | ICD-10-CM

## 2015-05-14 DIAGNOSIS — D61810 Antineoplastic chemotherapy induced pancytopenia: Secondary | ICD-10-CM

## 2015-05-14 LAB — CBC WITH DIFFERENTIAL/PLATELET
BASO%: 0.5 % (ref 0.0–2.0)
Basophils Absolute: 0 10*3/uL (ref 0.0–0.1)
EOS%: 7.1 % — AB (ref 0.0–7.0)
Eosinophils Absolute: 0.2 10*3/uL (ref 0.0–0.5)
HEMATOCRIT: 36.7 % — AB (ref 38.4–49.9)
HGB: 12.7 g/dL — ABNORMAL LOW (ref 13.0–17.1)
LYMPH%: 40.3 % (ref 14.0–49.0)
MCH: 36.6 pg — AB (ref 27.2–33.4)
MCHC: 34.5 g/dL (ref 32.0–36.0)
MCV: 106 fL — AB (ref 79.3–98.0)
MONO#: 0.4 10*3/uL (ref 0.1–0.9)
MONO%: 11.2 % (ref 0.0–14.0)
NEUT#: 1.3 10*3/uL — ABNORMAL LOW (ref 1.5–6.5)
NEUT%: 40.9 % (ref 39.0–75.0)
PLATELETS: 120 10*3/uL — AB (ref 140–400)
RBC: 3.46 10*6/uL — AB (ref 4.20–5.82)
RDW: 14.7 % — ABNORMAL HIGH (ref 11.0–14.6)
WBC: 3.1 10*3/uL — AB (ref 4.0–10.3)
lymph#: 1.3 10*3/uL (ref 0.9–3.3)

## 2015-05-14 NOTE — Telephone Encounter (Signed)
Whole pt was here I started to check his oxygen sat with oxygen on ( for [facility])  , but pt stated [facility] did not need the results and that he already had oxygen.

## 2015-05-14 NOTE — Telephone Encounter (Signed)
Per Julien Nordmann it is okay for pt to have dental extraction tomorrow as long as pt on antibiotics. Reviewed with pt and faxed labs to dentist. Pt concerned about swelling in legs and seems to be worse after  change in medication. I recommended he contact PCP . Labs faxed to dentist.

## 2015-05-24 ENCOUNTER — Ambulatory Visit (HOSPITAL_BASED_OUTPATIENT_CLINIC_OR_DEPARTMENT_OTHER): Payer: BLUE CROSS/BLUE SHIELD

## 2015-05-24 ENCOUNTER — Other Ambulatory Visit (HOSPITAL_BASED_OUTPATIENT_CLINIC_OR_DEPARTMENT_OTHER): Payer: BLUE CROSS/BLUE SHIELD

## 2015-05-24 ENCOUNTER — Ambulatory Visit (HOSPITAL_BASED_OUTPATIENT_CLINIC_OR_DEPARTMENT_OTHER): Payer: BLUE CROSS/BLUE SHIELD | Admitting: Nurse Practitioner

## 2015-05-24 ENCOUNTER — Telehealth: Payer: Self-pay | Admitting: *Deleted

## 2015-05-24 VITALS — BP 148/89 | HR 84 | Temp 98.3°F | Resp 17 | Ht 71.0 in | Wt 163.5 lb

## 2015-05-24 DIAGNOSIS — R0600 Dyspnea, unspecified: Secondary | ICD-10-CM | POA: Diagnosis not present

## 2015-05-24 DIAGNOSIS — K635 Polyp of colon: Secondary | ICD-10-CM

## 2015-05-24 DIAGNOSIS — R042 Hemoptysis: Secondary | ICD-10-CM

## 2015-05-24 DIAGNOSIS — Z5111 Encounter for antineoplastic chemotherapy: Secondary | ICD-10-CM | POA: Diagnosis not present

## 2015-05-24 DIAGNOSIS — C801 Malignant (primary) neoplasm, unspecified: Secondary | ICD-10-CM

## 2015-05-24 DIAGNOSIS — C3490 Malignant neoplasm of unspecified part of unspecified bronchus or lung: Secondary | ICD-10-CM

## 2015-05-24 DIAGNOSIS — C3491 Malignant neoplasm of unspecified part of right bronchus or lung: Secondary | ICD-10-CM

## 2015-05-24 LAB — COMPREHENSIVE METABOLIC PANEL (CC13)
ALT: 18 U/L (ref 0–55)
AST: 24 U/L (ref 5–34)
Albumin: 3.6 g/dL (ref 3.5–5.0)
Alkaline Phosphatase: 87 U/L (ref 40–150)
Anion Gap: 11 mEq/L (ref 3–11)
BUN: 12.7 mg/dL (ref 7.0–26.0)
CALCIUM: 9.2 mg/dL (ref 8.4–10.4)
CHLORIDE: 106 meq/L (ref 98–109)
CO2: 27 mEq/L (ref 22–29)
Creatinine: 0.9 mg/dL (ref 0.7–1.3)
EGFR: 90 mL/min/{1.73_m2} — ABNORMAL LOW (ref >90)
Glucose: 140 mg/dl (ref 70–140)
Potassium: 3.5 mEq/L (ref 3.5–5.1)
Sodium: 144 mEq/L (ref 136–145)
Total Bilirubin: 0.89 mg/dL (ref 0.20–1.20)
Total Protein: 6.6 g/dL (ref 6.4–8.3)

## 2015-05-24 LAB — CBC WITH DIFFERENTIAL/PLATELET
BASO%: 0.5 % (ref 0.0–2.0)
BASOS ABS: 0 10*3/uL (ref 0.0–0.1)
EOS%: 3.8 % (ref 0.0–7.0)
Eosinophils Absolute: 0.3 10*3/uL (ref 0.0–0.5)
HCT: 41.7 % (ref 38.4–49.9)
HGB: 14.4 g/dL (ref 13.0–17.1)
LYMPH#: 1.3 10*3/uL (ref 0.9–3.3)
LYMPH%: 17.5 % (ref 14.0–49.0)
MCH: 37 pg — ABNORMAL HIGH (ref 27.2–33.4)
MCHC: 34.6 g/dL (ref 32.0–36.0)
MCV: 106.9 fL — ABNORMAL HIGH (ref 79.3–98.0)
MONO#: 1 10*3/uL — AB (ref 0.1–0.9)
MONO%: 13.3 % (ref 0.0–14.0)
NEUT#: 4.7 10*3/uL (ref 1.5–6.5)
NEUT%: 64.9 % (ref 39.0–75.0)
Platelets: 342 10*3/uL (ref 140–400)
RBC: 3.9 10*6/uL — AB (ref 4.20–5.82)
RDW: 16.8 % — AB (ref 11.0–14.6)
WBC: 7.3 10*3/uL (ref 4.0–10.3)

## 2015-05-24 MED ORDER — SODIUM CHLORIDE 0.9 % IV SOLN
1000.0000 mg | Freq: Once | INTRAVENOUS | Status: AC
  Administered 2015-05-24: 1000 mg via INTRAVENOUS
  Filled 2015-05-24: qty 40

## 2015-05-24 MED ORDER — SODIUM CHLORIDE 0.9 % IV SOLN
Freq: Once | INTRAVENOUS | Status: AC
  Administered 2015-05-24: 11:00:00 via INTRAVENOUS

## 2015-05-24 MED ORDER — SODIUM CHLORIDE 0.9 % IV SOLN
Freq: Once | INTRAVENOUS | Status: AC
  Administered 2015-05-24: 11:00:00 via INTRAVENOUS
  Filled 2015-05-24: qty 4

## 2015-05-24 NOTE — Telephone Encounter (Signed)
Called and informed Dr. Lavetta Nielsen that it is OK for patient to have colonoscopy.  Per Dr. Julien Nordmann.  Would also like to know when colonoscopy will be so chemo can be worked around it.

## 2015-05-24 NOTE — Patient Instructions (Signed)
[facility] Discharge Instructions for Patients Receiving Chemotherapy  Today you received the following chemotherapy agents; Alimta.   To help prevent nausea and vomiting after your treatment, we encourage you to take your nausea medication as directed.    If you develop nausea and vomiting that is not controlled by your nausea medication, call the clinic.   BELOW ARE SYMPTOMS THAT SHOULD BE REPORTED IMMEDIATELY:  *FEVER GREATER THAN 100.5 F  *CHILLS WITH OR WITHOUT FEVER  NAUSEA AND VOMITING THAT IS NOT CONTROLLED WITH YOUR NAUSEA MEDICATION  *UNUSUAL SHORTNESS OF BREATH  *UNUSUAL BRUISING OR BLEEDING  TENDERNESS IN MOUTH AND THROAT WITH OR WITHOUT PRESENCE OF ULCERS  *URINARY PROBLEMS  *BOWEL PROBLEMS  UNUSUAL RASH Items with * indicate a potential emergency and should be followed up as soon as possible.  Feel free to call the clinic you have any questions or concerns. The clinic phone number is [phone].  Please show the CHEMO ALERT CARD at check-in to the Emergency Department and triage nurse.   

## 2015-05-24 NOTE — Progress Notes (Addendum)
[facility] OFFICE PROGRESS NOTE   DIAGNOSIS: Metastatic adenocarcinoma of unknown primary but questionable for upper gastrointestinal, pancreaticobiliary or primary lung cancer, diagnosed in August of 2015.  Genomic Alterations Identified? FGFR2 A97T KRAS Q61H - subclonal? ARID1A [id number]* - subclonal?  PRIOR THERAPY:  1) Status post right video-assisted thoracoscopy, drainage of pleural effusion, pleural biopsy, partial decortication, talc pleurodesis.under the care of Dr. Roxan Hockey on 06/23/2014. 2) Systemic chemotherapy with carboplatin for AUC of 5 and Alimta 500 mg/M2 every 3 weeks. He is status post 6 cycles, with stable disease after cycle #6.  CURRENT THERAPY: Maintenance chemotherapy with single agent Alimta 500 MG/M2 every 3 weeks. First dose 12/20/2014. He is status post 7 cycles.   INTERVAL HISTORY:   Henry [name] returns as scheduled. He completed cycle 7 of Alimta 05/03/2015. He denies nausea/vomiting. He notes his mouth is sensitive to certain foods. No ulcerations. No change in baseline loose stools. He reports he has seen Dr. Paulita Fujita and is planning to proceed with a colonoscopy. He describes his appetite as "fine". Dyspnea on exertion is stable to slightly worse. He continues to have mild intermittent hemoptysis. He is interested in seeing a pulmonologist to discuss the dyspnea and hemoptysis. He noted some soreness at the left chest 2-3 days ago. The soreness has resolved. He has a poor energy level. He would like to see a physical therapist.  Objective:  Vital signs in last 24 hours:  Blood pressure 148/89, pulse 84, temperature 98.3 F (36.8 C), temperature source Oral, resp. rate 17, height _0  (1.803 m), weight 163 lb 8 oz (74.163 kg), SpO2 96 %.    HEENT: No thrush or ulcers. Lymphatics: No palpable cervical or supra-clavicular lymph nodes. Resp: Breath sounds diminished right lower lung field. Good air movement on the left. Faint expiratory  wheezes anteriorly. Cardio: Regular rate and rhythm. GI: Abdomen soft and nontender. No hepatomegaly. Vascular: Trace lower leg/ankle edema bilaterally.     Lab Results:  Lab Results  Component Value Date   WBC 7.3 05/24/2015   HGB 14.4 05/24/2015   HCT 41.7 05/24/2015   MCV 106.9* 05/24/2015   PLT 342 05/24/2015   NEUTROABS 4.7 05/24/2015    Imaging:  No results found.  Medications: I have reviewed the patient's current medications.  Assessment/Plan: 1. Metastatic adenocarcinoma of unknown primary questionable for upper gastrointestinal, pancreatic or biliary or primary lung cancer. He completed systemic chemotherapy with carboplatin and Alimta status post 6 cycles, currently undergoing maintenance chemotherapy with single agent Alimta status post 7 cycles. Most recent restaging CT evaluation06/14/2016 showed further improvement. Maintenance Alimta continued. 2. Three polypoid lesions identified in the right colon on CT 05/01/2015. He has seen Dr. Paulita Fujita and reports a colonoscopy is planned.   Disposition: Henry [name] appears stable. Plan to proceed with cycle 8 maintenance Alimta today as scheduled. He will return for a follow-up visit and cycle 9 in 3 weeks.  With regard to the dyspnea on exertion, which is a chronic problem, and the mild intermittent hemoptysis, he would like to see a pulmonologist for any suggestions to optimize his lung function. We will make a referral.  Henry [name] is also interested in meeting with a physical therapist to try and improve his energy level. We will contact physical therapy regarding this.  Dr. Julien Nordmann is in agreement with proceeding with a colonoscopy. We will contact Dr. Erlinda Hong office so this can be coordinated with chemotherapy.    Ned Card ANP/GNP-BC   05/24/2015  10:52 AM  ADDENDUM: Hematology/Oncology Attending: I had a face to face encounter with the patient. I recommended his care plan. This is a very pleasant 64 years  old white male with metastatic non-small cell lung cancer diagnosed in August 2015 and currently undergoing maintenance chemotherapy with single agent Alimta status post 7 cycles and tolerating his treatment fairly well. I recommended for the patient to proceed with cycle #8 today as scheduled. He was seen by Dr. Paulita Fujita for the questionable colon polyp seen on the previous CT scan of the abdomen and pelvis. I explained to the patient that he should be able to proceed with the colonoscopy needed as long as done in the third week off after his chemotherapy and has repeat CBC to make sure that his total white blood count is within the normal range. The patient has few question about his previous scan and I reviewed the images with the patient again today and answered his questions.. He would come back for follow-up visit in 3 weeks for reevaluation before starting cycle #9. He was advised to call immediately if he has any concerning symptoms in the interval.  Disclaimer: This note was dictated with voice recognition software. Similar sounding words can inadvertently be transcribed and may not be corrected upon review. Henry [name]., MD 05/26/2015

## 2015-05-25 ENCOUNTER — Encounter: Payer: Self-pay | Admitting: Gastroenterology

## 2015-05-25 ENCOUNTER — Telehealth: Payer: Self-pay | Admitting: *Deleted

## 2015-05-25 NOTE — Telephone Encounter (Signed)
See addendum

## 2015-05-25 NOTE — Telephone Encounter (Signed)
Patient called today reporting GI Dr. Paulita Fujita has not heard from Dr. Julien Nordmann and he needs to have colonoscopy or surgery.  Observed and shared yesterday's note with Henry [name].  Per patient, "Dr. Paulita Fujita wants to talk to Dr. Julien Nordmann and he must not have received the information."  Patient aware Dr. Julien Nordmann will return 05-28-2015.  Will notify Dr. Julien Nordmann of this call.  Dr. Paulita Fujita office number is [phone].

## 2015-05-25 NOTE — Telephone Encounter (Signed)
I sent Dr. Julien Nordmann an EPIC message inquiring about this case yesterday, not realizing he was out of town.  I would like to have a direct conversation with him about this.

## 2015-05-29 ENCOUNTER — Other Ambulatory Visit: Payer: Self-pay | Admitting: Gastroenterology

## 2015-05-29 ENCOUNTER — Other Ambulatory Visit: Payer: Self-pay | Admitting: *Deleted

## 2015-05-29 DIAGNOSIS — C3490 Malignant neoplasm of unspecified part of unspecified bronchus or lung: Secondary | ICD-10-CM

## 2015-05-29 NOTE — Addendum Note (Signed)
Addended by: Arta Silence on: 05/29/2015 03:07 PM   Modules accepted: Orders

## 2015-05-30 ENCOUNTER — Other Ambulatory Visit: Payer: Self-pay | Admitting: Gastroenterology

## 2015-05-30 ENCOUNTER — Ambulatory Visit: Payer: BLUE CROSS/BLUE SHIELD | Admitting: Physical Therapy

## 2015-05-30 ENCOUNTER — Encounter: Payer: Self-pay | Admitting: Physical Therapy

## 2015-05-30 ENCOUNTER — Ambulatory Visit: Payer: BLUE CROSS/BLUE SHIELD | Attending: Internal Medicine | Admitting: Physical Therapy

## 2015-05-30 VITALS — HR 87

## 2015-05-30 DIAGNOSIS — R29898 Other symptoms and signs involving the musculoskeletal system: Secondary | ICD-10-CM | POA: Insufficient documentation

## 2015-05-30 DIAGNOSIS — R0602 Shortness of breath: Secondary | ICD-10-CM | POA: Diagnosis present

## 2015-05-30 DIAGNOSIS — R5381 Other malaise: Secondary | ICD-10-CM | POA: Diagnosis present

## 2015-05-30 NOTE — Therapy (Signed)
[place], [place], [address] Phone: [phone]   Fax:  [phone]  Physical Therapy Evaluation  Patient Details  Name: Henry [name] MRN: [id number] Date of Birth: Jul 30, 1951 Referring Provider:  Curt Bears, MD  Encounter Date: 05/30/2015      PT End of Session - 05/30/15 2310    Visit Number 1   Number of Visits 16   Date for PT Re-Evaluation 08/17/15   PT Start Time 0950   PT Stop Time 1035   PT Time Calculation (min) 45 min   Activity Tolerance Patient tolerated treatment well   Behavior During Therapy [facility] for tasks assessed/performed      Past Medical History  Diagnosis Date  . Hypertension   . GERD (gastroesophageal reflux disease)   . Hemochromatosis   . Hx of adenomatous colonic polyps   . Umbilical hernia     none since weight loss  . Diverticulosis   . Lumbar spondylosis     ankle  . Pleural effusion on right 12/16/13    per chest xray  . Shortness of breath     with  exertion  . Chronic pancreatitis   . IBS (irritable bowel syndrome)   . Lung cancer 06/2014  . 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 Lumbar laminectomy  1983  . Knee arthrsoscopy  2003    left  . Tonsillectomy  1962  . Eus N/A 04/07/2013    Procedure: UPPER ENDOSCOPIC ULTRASOUND (EUS) LINEAR;  Surgeon: [name], MD;  Location: [facility];  Service: Endoscopy;  Laterality: N/A;  . Video assisted thoracoscopy (vats)/decortication Right 06/22/2014    Procedure: VIDEO ASSISTED THORACOSCOPY (VATS)/DECORTICATION;  Surgeon: [name], MD;  Location: [facility];  Service: Thoracic;  Laterality: Right;  . Pleural effusion drainage Right 06/22/2014    Procedure: DRAINAGE OF PLEURAL EFFUSION;  Surgeon: [name], MD;  Location: [facility];  Service: Thoracic;  Laterality: Right;  . Eus N/A 07/20/2014    Procedure: UPPER ENDOSCOPIC ULTRASOUND  (EUS) LINEAR;  Surgeon: [name], MD;  Location: [facility];  Service: Endoscopy;  Laterality: N/A;    Filed Vitals:   05/30/15 1019 05/30/15 1030  Pulse: 88 87  SpO2: 95% 95%    Visit Diagnosis:  Physical deconditioning - Plan: PT plan of care cert/re-cert  Shortness of breath on exertion - Plan: PT plan of care cert/re-cert  Weakness of both legs - Plan: PT plan of care cert/re-cert      Subjective Assessment - 05/30/15 0957    Subjective Decreased energy since cancer treatment; has lost 60 lbs. and muscle mass with digestive problems; shortness of breath.  Did gain 30 lbs. back of more than 60 he had lost.   Pertinent History Diagnosed with adenocarcinoma 06/24/2014 from unknown primary source; had surgery for chest tube placement, which were just in a week while in the hospital.  Has been on chemo every three weeks and continues on that.  Is on maintenance dose of one chemo drug right now; get thrush from it and so has ongoing mouth irritation from it.Marland Kitchen  HTN under moderate control with meds; has oxygen concentrator at home and uses it to help his energy level and breathing.  Gets swelling in right ankle and is helped with elevation; this started with treatment. Digestive problems.   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  Back surgery 1984 for discectomy.  Back doesn't bother him currently.                                                                              Patient Stated Goals more energy, increase muscle mass, and decrease shortness of breath, better quality of life   Currently in Pain? No/denies            [facility] PT Assessment - 05/30/15 0001    Assessment   Medical Diagnosis adenocarcinoma of unknown primary   Onset Date/Surgical Date 06/24/14   Precautions   Precautions Other (comment)  cancer precautions   Restrictions   Weight Bearing Restrictions No   Balance Screen   Has the patient fallen  in the past 6 months No   Has the patient had a decrease in activity level because of a fear of falling?  No   Is the patient reluctant to leave their home because of a fear of falling?  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, but does yardwork and lawnmowing, stain barn, gravel in driveway   Posture/Postural Control   Posture/Postural Control Postural limitations   Postural Limitations Forward head  significant forward head   ROM / Strength   AROM / PROM / Strength AROM;Strength   AROM   Overall AROM Comments LEs grossly WFL with mild tightness overable   Strength   Overall Strength Comments mild SOB after testing   Strength Assessment Site Hip;Knee;Ankle   Right/Left Hip Right;Left   Right Hip Flexion 3+/5   Right Hip Extension 3/5   Right Hip ABduction 4/5   Left Hip Flexion 3+/5   Left Hip Extension 3-/5   Left Hip ABduction 4/5   Right/Left Knee Right;Left   Right Knee Flexion 4/5   Right Knee Extension 4+/5   Left Knee Flexion 4/5   Left Knee Extension 4+/5   Right/Left Ankle Right;Left   Right Ankle Dorsiflexion 4+/5   Left Ankle Dorsiflexion 4+/5   Ambulation/Gait   Assistive device None                              Short Term Clinic Goals - 05/30/15 2322    CC Short Term Goal  #1   Title independent with home exercise program for strengthening and endurance, performed safely   Time 4   Period Weeks   Status New   CC Short Term Goal  #2   Title will report perception of decreased fatigue since  starting exercise   Time 4   Period Weeks   Status New  [facility] Goals - 05/30/15 2323    CC Long Term Goal  #1   Title will report perception of at least 20% improvement in energy level   Time 8   Period Weeks   Status New   CC Long Term Goal  #2   Title lower extremity strength at least 4/5 throughout   Time 8   Period Weeks   Status New   CC Long Term Goal  #3   Title will report perception of at least 20% decrease in shortness of breath on exertion   Time 8   Period Weeks   Status New   CC Long Term Goal  #4   Title will improve 6 minute walk test distance at least 25%   Time 8   Period Weeks   Status New            Plan - 05/30/15 2311    Clinical Impression Statement Patient who is on maintenance chemotherapy for adenocarcinoma of unknown origin and complains of fatigue, shortness of breath, and weakness.  He was late for evaluation, so I was unable to fully assess all these issues, but he did experience mild shortness of breath with limited activity during evaluation and does show lower extremity muscle strength in the 3-4+/5 range.  He would benefit from therapy to improve his quality of life, one of his goals.  Patient himself initiated therapy and is motivated to get better.  He may have some trouble getting to alll of his appointments because he is self-employed, needs to work, and sometimes gets last-minute requests for his services.   Pt will benefit from skilled therapeutic intervention in order to improve on the following deficits Cardiopulmonary status limiting activity;Decreased endurance;Decreased strength   Rehab Potential Good   PT Frequency 2x / week   PT Duration 8 weeks   PT Treatment/Interventions Therapeutic exercise;Functional mobility training;Neuromuscular re-education;Patient/family education   PT Next Visit Plan Do 6 minute walk test,  monitoring O2 saturation; do UE manual muscle test.  Then begin ther ex to improve  strength and endurance; teach HEP.   PT Home Exercise Plan strengthening and endurance exercise   Consulted and Agree with Plan of Care Patient         Problem List Patient Active Problem List   Diagnosis Date Noted  . Encounter for antineoplastic chemotherapy 05/05/2015  . Hypokalemia 08/31/2014  . Hyperbilirubinemia 08/31/2014  . Candida infection, esophageal 08/30/2014  . Thyroid nodule 07/18/2014  . Protein-calorie malnutrition, severe 06/26/2014  . Lung cancer 06/22/2014  . Pleural effusion on right 12/16/2013  . Nonspecific (abnormal) findings on radiological and other examination of gastrointestinal tract 04/07/2013  . Unspecified gastritis and gastroduodenitis without mention of hemorrhage 04/07/2013  . HTN (hypertension) 01/31/2013  . Hx of adenomatous colonic polyps 01/31/2013  . Hemochromatosis 01/31/2013    SALISBURY,DONNA 05/30/2015, 11:29 PM  [place], [place], [address] Phone: [phone]   Fax:  [place], PT 05/30/2015 11:29 PM

## 2015-05-30 NOTE — Addendum Note (Signed)
Addended by: Arta Silence on: 05/30/2015 11:00 AM   Modules accepted: Orders

## 2015-06-04 ENCOUNTER — Encounter: Payer: Self-pay | Admitting: Internal Medicine

## 2015-06-05 ENCOUNTER — Encounter: Payer: Self-pay | Admitting: Internal Medicine

## 2015-06-05 ENCOUNTER — Telehealth: Payer: Self-pay

## 2015-06-05 ENCOUNTER — Telehealth: Payer: Self-pay | Admitting: *Deleted

## 2015-06-05 NOTE — Telephone Encounter (Signed)
Pt went to walk in clinic on Saturday. Been taking antibiotics since Saturday. Cellulitis is in right ankle. Bottom part of leg swollen, sunburnt looking red ankle to mid calf, tenderness and pain in calf, no blistering, is sloughing off skin especially after putting cream on it for moisturizing. Elevating feet does help some since started antibiotic. Both legs are swollen but right leg is worse. Pt is asking if he needs to come in to be seen, especially since it is not getting any better.

## 2015-06-05 NOTE — Telephone Encounter (Signed)
S/w Selena Lesser NP. We will see pt tomorrow AM, pt agreed to 0930. POF sent

## 2015-06-05 NOTE — Telephone Encounter (Signed)
VM message from patient received @ 12:15 pm regarding his treatment for cellulitis. He states that it is not better but it is not worse either. He is wondering if someone should look at it. He is scheduled for colonoscopy next week and wants everything to be ok.He states he is on antibiotics. No mention of cellulitis /abx in recent notes. No abx on med list. TC back to patient on identified phone #. No answer. Left VM for patient to call back.

## 2015-06-05 NOTE — Telephone Encounter (Signed)
He may need to see his PCP for the cellulitis. If he has no PCP, [facility] can see.

## 2015-06-05 NOTE — Telephone Encounter (Signed)
error 

## 2015-06-06 ENCOUNTER — Emergency Department (HOSPITAL_COMMUNITY)
Admission: EM | Admit: 2015-06-06 | Discharge: 2015-06-06 | Disposition: A | Discharge status: Home / Self Care | Payer: BLUE CROSS/BLUE SHIELD | Attending: Emergency Medicine | Admitting: Emergency Medicine

## 2015-06-06 ENCOUNTER — Ambulatory Visit: Payer: BLUE CROSS/BLUE SHIELD

## 2015-06-06 ENCOUNTER — Encounter (HOSPITAL_COMMUNITY): Payer: Self-pay | Admitting: Emergency Medicine

## 2015-06-06 ENCOUNTER — Emergency Department (HOSPITAL_BASED_OUTPATIENT_CLINIC_OR_DEPARTMENT_OTHER): Payer: BLUE CROSS/BLUE SHIELD

## 2015-06-06 ENCOUNTER — Ambulatory Visit (HOSPITAL_BASED_OUTPATIENT_CLINIC_OR_DEPARTMENT_OTHER): Payer: BLUE CROSS/BLUE SHIELD | Admitting: Nurse Practitioner

## 2015-06-06 VITALS — BP 148/90 | HR 89 | Temp 89.0°F | Resp 17 | Wt 159.5 lb

## 2015-06-06 DIAGNOSIS — C3490 Malignant neoplasm of unspecified part of unspecified bronchus or lung: Secondary | ICD-10-CM | POA: Diagnosis not present

## 2015-06-06 DIAGNOSIS — K861 Other chronic pancreatitis: Secondary | ICD-10-CM | POA: Insufficient documentation

## 2015-06-06 DIAGNOSIS — Z720 Tobacco use: Secondary | ICD-10-CM | POA: Diagnosis not present

## 2015-06-06 DIAGNOSIS — Z86010 Personal history of colonic polyps: Secondary | ICD-10-CM | POA: Diagnosis not present

## 2015-06-06 DIAGNOSIS — I10 Essential (primary) hypertension: Secondary | ICD-10-CM | POA: Diagnosis not present

## 2015-06-06 DIAGNOSIS — R2241 Localized swelling, mass and lump, right lower limb: Secondary | ICD-10-CM | POA: Diagnosis present

## 2015-06-06 DIAGNOSIS — M7989 Other specified soft tissue disorders: Secondary | ICD-10-CM

## 2015-06-06 DIAGNOSIS — Z8639 Personal history of other endocrine, nutritional and metabolic disease: Secondary | ICD-10-CM | POA: Diagnosis not present

## 2015-06-06 DIAGNOSIS — Z79899 Other long term (current) drug therapy: Secondary | ICD-10-CM | POA: Diagnosis not present

## 2015-06-06 DIAGNOSIS — C73 Malignant neoplasm of thyroid gland: Secondary | ICD-10-CM | POA: Diagnosis not present

## 2015-06-06 DIAGNOSIS — Z8739 Personal history of other diseases of the musculoskeletal system and connective tissue: Secondary | ICD-10-CM | POA: Insufficient documentation

## 2015-06-06 DIAGNOSIS — L03115 Cellulitis of right lower limb: Secondary | ICD-10-CM | POA: Diagnosis not present

## 2015-06-06 DIAGNOSIS — R600 Localized edema: Secondary | ICD-10-CM

## 2015-06-06 LAB — CBC WITH DIFFERENTIAL/PLATELET
BASOS ABS: 0 10*3/uL (ref 0.0–0.1)
BASOS PCT: 0 % (ref 0–1)
Eosinophils Absolute: 0.2 10*3/uL (ref 0.0–0.7)
Eosinophils Relative: 5 % (ref 0–5)
HCT: 34.9 % — ABNORMAL LOW (ref 39.0–52.0)
Hemoglobin: 12.5 g/dL — ABNORMAL LOW (ref 13.0–17.0)
LYMPHS ABS: 1.7 10*3/uL (ref 0.7–4.0)
LYMPHS PCT: 47 % — AB (ref 12–46)
MCH: 36.5 pg — ABNORMAL HIGH (ref 26.0–34.0)
MCHC: 35.8 g/dL (ref 30.0–36.0)
MCV: 102 fL — ABNORMAL HIGH (ref 78.0–100.0)
MONO ABS: 0.9 10*3/uL (ref 0.1–1.0)
Monocytes Relative: 24 % — ABNORMAL HIGH (ref 3–12)
NEUTROS ABS: 0.9 10*3/uL — AB (ref 1.7–7.7)
Neutrophils Relative %: 24 % — ABNORMAL LOW (ref 43–77)
Platelets: 132 10*3/uL — ABNORMAL LOW (ref 150–400)
RBC: 3.42 MIL/uL — AB (ref 4.22–5.81)
RDW: 15.7 % — AB (ref 11.5–15.5)
WBC: 3.7 10*3/uL — AB (ref 4.0–10.5)

## 2015-06-06 LAB — BASIC METABOLIC PANEL
ANION GAP: 8 (ref 5–15)
BUN: 16 mg/dL (ref 6–20)
CHLORIDE: 106 mmol/L (ref 101–111)
CO2: 27 mmol/L (ref 22–32)
CREATININE: 0.9 mg/dL (ref 0.61–1.24)
Calcium: 8.7 mg/dL — ABNORMAL LOW (ref 8.9–10.3)
Glucose, Bld: 118 mg/dL — ABNORMAL HIGH (ref 65–99)
POTASSIUM: 3.1 mmol/L — AB (ref 3.5–5.1)
Sodium: 141 mmol/L (ref 135–145)

## 2015-06-06 MED ORDER — POTASSIUM CHLORIDE CRYS ER 20 MEQ PO TBCR
40.0000 meq | EXTENDED_RELEASE_TABLET | Freq: Once | ORAL | Status: DC
  Filled 2015-06-06: qty 2

## 2015-06-06 MED ORDER — CLINDAMYCIN HCL 150 MG PO CAPS: 150.0000 mg | ORAL_CAPSULE | Freq: Four times a day (QID) | ORAL | Status: DC

## 2015-06-06 MED ORDER — CLINDAMYCIN PHOSPHATE 600 MG/50ML IV SOLN
600.0000 mg | Freq: Once | INTRAVENOUS | Status: AC
  Administered 2015-06-06: 600 mg via INTRAVENOUS
  Filled 2015-06-06: qty 50

## 2015-06-06 NOTE — ED Notes (Signed)
Pt refused to wear yellow socks.

## 2015-06-06 NOTE — ED Notes (Signed)
Pt to ED via private vehicle with c/o continuing swelling in right leg, has been seen at walk in clinic on Saturday, right leg is red and swollen-- positive pedal pulse.

## 2015-06-06 NOTE — Discharge Instructions (Signed)
If you were given medicines take as directed.  If you are on coumadin or contraceptives realize their levels and effectiveness is altered by many different medicines.  If you have any reaction (rash, tongues swelling, other) to the medicines stop taking and see a physician.   Recheck before the weekend. If your blood pressure was elevated in the ER make sure you follow up for management with a primary doctor or return for chest pain, shortness of breath or stroke symptoms.  Please follow up as directed and return to the ER or see a physician for new or worsening symptoms.  Thank you. Filed Vitals:   06/06/15 1330 06/06/15 1400 06/06/15 1430 06/06/15 1445  BP: 152/99 155/98 161/98 152/92  Pulse: 83 82 84 86  Temp:      Resp:      SpO2: 97% 97% 96% 98%   Venous Stasis or Chronic Venous Insufficiency Chronic venous insufficiency, also called venous stasis, is a condition that affects the veins in the legs. The condition prevents blood from being pumped through these veins effectively. Blood may no longer be pumped effectively from the legs back to the heart. This condition can range from mild to severe. With proper treatment, you should be able to continue with an active life. CAUSES  Chronic venous insufficiency occurs when the vein walls become stretched, weakened, or damaged or when valves within the vein are damaged. Some common causes of this include:  High blood pressure inside the veins (venous hypertension).  Increased blood pressure in the leg veins from long periods of sitting or standing.  A blood clot that blocks blood flow in a vein (deep vein thrombosis).  Inflammation of a superficial vein (phlebitis) that causes a blood clot to form. RISK FACTORS Various things can make you more likely to develop chronic venous insufficiency, including:  Family history of this condition.  Obesity.  Pregnancy.  Sedentary lifestyle.  Smoking.  Jobs requiring long periods of standing  or sitting in one place.  Being a certain age. Women in their 21s and 51s and men in their 16s are more likely to develop this condition. SIGNS AND SYMPTOMS  Symptoms may include:   Varicose veins.  Skin breakdown or ulcers.  Reddened or discolored skin on the leg.  Brown, smooth, tight, and painful skin just above the ankle, usually on the inside surface (lipodermatosclerosis).  Swelling. DIAGNOSIS  To diagnose this condition, your health care provider will take a medical history and do a physical exam. The following tests may be ordered to confirm the diagnosis:  Duplex ultrasound--A procedure that produces a picture of a blood vessel and nearby organs and also provides information on blood flow through the blood vessel.  Plethysmography--A procedure that tests blood flow.  A venogram, or venography--A procedure used to look at the veins using X-ray and dye. TREATMENT The goals of treatment are to help you return to an active life and to minimize pain or disability. Treatment will depend on the severity of the condition. Medical procedures may be needed for severe cases. Treatment options may include:   Use of compression stockings. These can help with symptoms and lower the chances of the problem getting worse, but they do not cure the problem.  Sclerotherapy--A procedure involving an injection of a material that "dissolves" the damaged veins. Other veins in the network of blood vessels take over the function of the damaged veins.  Surgery to remove the vein or cut off blood flow through the vein (  vein stripping or laser ablation surgery).  Surgery to repair a valve. HOME CARE INSTRUCTIONS   Wear compression stockings as directed by your health care provider.  Only take over-the-counter or prescription medicines for pain, discomfort, or fever as directed by your health care provider.  Follow up with your health care provider as directed. SEEK MEDICAL CARE IF:   You have  redness, swelling, or increasing pain in the affected area.  You see a red streak or line that extends up or down from the affected area.  You have a breakdown or loss of skin in the affected area, even if the breakdown is small.  You have an injury to the affected area. SEEK IMMEDIATE MEDICAL CARE IF:   You have an injury and open wound in the affected area.  Your pain is severe and does not improve with medicine.  You have sudden numbness or weakness in the foot or ankle below the affected area, or you have trouble moving your foot or ankle.  You have a fever or persistent symptoms for more than 2-3 days.  You have a fever and your symptoms suddenly get worse. MAKE SURE YOU:   Understand these instructions.  Will watch your condition.  Will get help right away if you are not doing well or get worse. Document Released: 03/09/2007 Document Revised: 08/24/2013 Document Reviewed: 07/11/2013 [facility] Patient Information 2015 [place], [place]. This information is not intended to replace advice given to you by your health care provider. Make sure you discuss any questions you have with your health care provider. Cellulitis Cellulitis is an infection of the skin and the tissue beneath it. The infected area is usually red and tender. Cellulitis occurs most often in the arms and lower legs.  CAUSES  Cellulitis is caused by bacteria that enter the skin through cracks or cuts in the skin. The most common types of bacteria that cause cellulitis are staphylococci and streptococci. SIGNS AND SYMPTOMS   Redness and warmth.  Swelling.  Tenderness or pain.  Fever. DIAGNOSIS  Your health care provider can usually determine what is wrong based on a physical exam. Blood tests may also be done. TREATMENT  Treatment usually involves taking an antibiotic medicine. HOME CARE INSTRUCTIONS   Take your antibiotic medicine as directed by your health care provider. Finish the antibiotic even if you  start to feel better.  Keep the infected arm or leg elevated to reduce swelling.  Apply a warm cloth to the affected area up to 4 times per day to relieve pain.  Take medicines only as directed by your health care provider.  Keep all follow-up visits as directed by your health care provider. SEEK MEDICAL CARE IF:   You notice red streaks coming from the infected area.  Your red area gets larger or turns dark in color.  Your bone or joint underneath the infected area becomes painful after the skin has healed.  Your infection returns in the same area or another area.  You notice a swollen bump in the infected area.  You develop new symptoms.  You have a fever. SEEK IMMEDIATE MEDICAL CARE IF:   You feel very sleepy.  You develop vomiting or diarrhea.  You have a general ill feeling (malaise) with muscle aches and pains. MAKE SURE YOU:   Understand these instructions.  Will watch your condition.  Will get help right away if you are not doing well or get worse. Document Released: 08/13/2005 Document Revised: 03/20/2014 Document Reviewed: 01/19/2012 [employer]  Patient Information 2015 [place]. This information is not intended to replace advice given to you by your health care provider. Make sure you discuss any questions you have with your health care provider.

## 2015-06-06 NOTE — ED Notes (Signed)
Pt. Transported to [facility]

## 2015-06-06 NOTE — ED Notes (Addendum)
EDP at bedside  

## 2015-06-06 NOTE — ED Notes (Signed)
Pt returned from Vascular. 

## 2015-06-06 NOTE — Progress Notes (Addendum)
VASCULAR LAB PRELIMINARY  PRELIMINARY  PRELIMINARY  PRELIMINARY  Right lower extremity venous duplex completed.    Preliminary report:  Right:  No evidence of DVT, superficial thrombosis, or Baker's cyst.  Lymph nodes noted in the groin.  Significant interstitial fluid noted in the calf.  Mozell Haber, RVT 06/06/2015, 3:16 PM

## 2015-06-06 NOTE — ED Notes (Signed)
Patient getting a scan

## 2015-06-06 NOTE — ED Provider Notes (Signed)
CSN: [id number]     Arrival date & time 06/06/15  1215 History   First MD Initiated Contact with Patient 06/06/15 1218     Chief Complaint  Patient presents with  . Cellulitis     (Consider location/radiation/quality/duration/timing/severity/associated sxs/prior Treatment) HPI Comments: 64 year old male with history of high blood pressure, actively being treated for lung cancer, thyroid cancer, chemotherapy use, currently being treated for cellulitis in the right lower extremity presents via private vehicle with continued swelling and erythema of the right lower extremity. Patient does not feel anything is worsened or improved despite being on doxycycline. No blood clot history. Mild tenderness to palpation. No fevers or chills. No new shortness of breath or chest pain.  The history is provided by the patient.    Past Medical History  Diagnosis Date  . Hypertension   . GERD (gastroesophageal reflux disease)   . Hemochromatosis   . Hx of adenomatous colonic polyps   . Umbilical hernia     none since weight loss  . Diverticulosis   . Lumbar spondylosis     ankle  . Pleural effusion on right 12/16/13    per chest xray  . Shortness of breath     with  exertion  . Chronic pancreatitis   . IBS (irritable bowel syndrome)   . Lung cancer 06/2014  . 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 Lumbar laminectomy  1983  . Knee arthrsoscopy  2003    left  . Tonsillectomy  1962  . Eus N/A 04/07/2013    Procedure: UPPER ENDOSCOPIC ULTRASOUND (EUS) LINEAR;  Surgeon: [name], MD;  Location: [facility];  Service: Endoscopy;  Laterality: N/A;  . Video assisted thoracoscopy (vats)/decortication Right 06/22/2014    Procedure: VIDEO ASSISTED THORACOSCOPY (VATS)/DECORTICATION;  Surgeon: [name], MD;  Location: [place];  Service: Thoracic;  Laterality: Right;  . Pleural effusion drainage Right 06/22/2014    Procedure: DRAINAGE OF  PLEURAL EFFUSION;  Surgeon: [name], MD;  Location: [place];  Service: Thoracic;  Laterality: Right;  . Eus N/A 07/20/2014    Procedure: UPPER ENDOSCOPIC ULTRASOUND (EUS) LINEAR;  Surgeon: [name], MD;  Location: [facility];  Service: Endoscopy;  Laterality: N/A;   Family History  Problem Relation Age of Onset  . Colon cancer Maternal Grandmother 49  . Stomach cancer Paternal Grandfather 61  . Hypertension Mother   . Hypertension Father   . Hypertension Sister    History  Substance Use Topics  . Smoking status: Current Every Day Smoker -- 2.00 packs/day for 40 years    Types: Cigarettes  . Smokeless tobacco: Never Used     Comment: 12/ to 3/4 packer per day now  . Alcohol Use: 12.6 oz/week    21 Cans of beer per week     Comment: Daily 2-3 beers    Review of Systems  Constitutional: Negative for fever and chills.  Respiratory: Negative for shortness of breath.   Cardiovascular: Positive for leg swelling. Negative for chest pain.  Gastrointestinal: Negative for vomiting and abdominal pain.  Genitourinary: Negative for dysuria and flank pain.  Musculoskeletal: Negative for back pain, neck pain and neck stiffness.  Skin: Positive for rash.  Neurological: Negative for light-headedness and headaches.      Allergies  Other  Home Medications   Prior to Admission medications   Medication Sig Start Date End Date Taking? Authorizing Provider  doxycycline (VIBRA-TABS) 100 MG tablet TK  1 T PO BID FOR 10 DAYS 06/02/15  Yes Historical Provider, MD  folic acid (FOLVITE) 1 MG tablet Take 1 tablet (1 mg total) by mouth daily. 11/29/14  Yes Curt Bears, MD  furosemide (LASIX) 40 MG tablet Take 40 mg by mouth every morning.  04/03/15  Yes Historical Provider, MD  metoprolol succinate (TOPROL-XL) 50 MG 24 hr tablet Take 50 mg by mouth daily after lunch.  03/29/15  Yes Historical Provider, MD  oxyCODONE (OXY IR/ROXICODONE) 5 MG immediate release tablet Take 5 mg by mouth  every 4 (four) hours as needed for moderate pain.  03/08/15  Yes Historical Provider, MD  potassium chloride SA (K-DUR,KLOR-CON) 20 MEQ tablet Take 20 mEq by mouth daily.   Yes Historical Provider, MD  spironolactone (ALDACTONE) 25 MG tablet Take 25 mg by mouth daily after lunch.   Yes Historical Provider, MD  UNABLE TO FIND Med Name: Oxygen concentrator.  Per medical necessity pt needs portable oxygen concentrator via nasal cannula with activity. 05/10/15  Yes Curt Bears, MD  clindamycin (CLEOCIN) 150 MG capsule Take 1 capsule (150 mg total) by mouth every 6 (six) hours. 06/06/15   Elnora Morrison, MD  EPIPEN 2-PAK 0.3 MG/0.3ML SOAJ injection See admin instructions. 05/31/15   Historical Provider, MD  ibuprofen (ADVIL,MOTRIN) 200 MG tablet Take 400 mg by mouth every 6 (six) hours as needed (Pain).    Historical Provider, MD  Pancrelipase, Lip-Prot-Amyl, [id number] UNITS TABS Take 2 tablets by mouth 3 (three) times daily with meals. Patient not taking: Reported on 05/31/2015 07/25/14   [name], MD   BP 156/99 mmHg  Pulse 81  Temp(Src) 98.9 F (37.2 C)  Resp 18  SpO2 99% Physical Exam  Constitutional: He is oriented to person, place, and time. He appears well-developed and well-nourished.  HENT:  Head: Normocephalic and atraumatic.  Eyes: Conjunctivae are normal. Right eye exhibits no discharge. Left eye exhibits no discharge.  Neck: Normal range of motion. Neck supple. No tracheal deviation present.  Cardiovascular: Normal rate.   Pulmonary/Chest: Effort normal.  Musculoskeletal: He exhibits edema and tenderness.  Neurological: He is alert and oriented to person, place, and time.  Skin: Skin is warm. No rash noted. There is erythema.  Patient has mild erythema from anterior mid tibia down to dorsum of the foot, no crepitus, mild tender to palpation, mild swelling, no open wounds.  Psychiatric: He has a normal mood and affect.  Nursing note and vitals reviewed.   ED Course   Procedures (including critical care time) Labs Review Labs Reviewed  CBC WITH DIFFERENTIAL/PLATELET - Abnormal; Notable for the following:    WBC 3.7 (*)    RBC 3.42 (*)    Hemoglobin 12.5 (*)    HCT 34.9 (*)    MCV 102.0 (*)    MCH 36.5 (*)    RDW 15.7 (*)    Platelets 132 (*)    Neutrophils Relative % 24 (*)    Lymphocytes Relative 47 (*)    Monocytes Relative 24 (*)    Neutro Abs 0.9 (*)    All other components within normal limits  BASIC METABOLIC PANEL - Abnormal; Notable for the following:    Potassium 3.1 (*)    Glucose, Bld 118 (*)    Calcium 8.7 (*)    All other components within normal limits    Imaging Review No results found.   EKG Interpretation None      MDM   Final diagnoses:  Cellulitis of right leg  Leg edema, right   Patient presents with clinical concern for cellulitis not improving and doxycycline. Pt does not want inpt treatment. With cancer history also concern for blood clot, ultrasound ordered, IV antibx plan for oral clindamycin and outpatient follow-up for reassessment before the weekend. Patient has no systemic signs at this time.  Results and differential diagnosis were discussed with the patient/parent/guardian. Xrays were independently reviewed by myself.  Close follow up outpatient was discussed, comfortable with the plan.   Medications  clindamycin (CLEOCIN) IVPB 600 mg (0 mg Intravenous Stopped 06/06/15 1545)    Filed Vitals:   06/06/15 1600 06/06/15 1615 06/06/15 1630 06/06/15 1700  BP: 149/95 150/91 126/76 156/99  Pulse: 82 86 82 81  Temp:      Resp:      SpO2: 97% 97% 94% 99%    Final diagnoses:  Cellulitis of right leg  Leg edema, right      Elnora Morrison, MD 06/08/15 1701

## 2015-06-07 ENCOUNTER — Encounter: Payer: Self-pay | Admitting: Nurse Practitioner

## 2015-06-07 ENCOUNTER — Other Ambulatory Visit: Payer: Self-pay | Admitting: Gastroenterology

## 2015-06-07 DIAGNOSIS — L0390 Cellulitis, unspecified: Secondary | ICD-10-CM | POA: Insufficient documentation

## 2015-06-07 NOTE — Assessment & Plan Note (Signed)
Patient has a history of some chronic right rated and left leg edema.  He underwent a bilateral Doppler ultrasound of legs in April 2016 which was negative for DVT.  Patient reports increasing/progressive edema, erythema, and mild tenderness to his right lower leg for the past 1-2 weeks.  He called his primary care provider Dr. Donnie Coffin this past Friday, 06/01/2015; and was advised to go to a local urgent care for further evaluation.  Patient did go to an [place] urgent care this past Saturday, 06/02/2015; and was diagnosed with probable cellulitis of his right lower leg.  The area of erythema/edema was marked with permanent marker as well.  Patient was prescribed doxycycline anti-biotics at that time.  Patient states that the erythema, edema, warmth, and mild tenderness has progressed above the level of the skin mark to his right lower extremity.  He denies any recent fevers or chills.  On exam.-It does appear that patient's right lower extremity erythema, edema, warmth, mild tenderness has now progressed above the level of the recent skin marker site.  Patient also appears to have mild increased cyanosis to his entire right foot; with increased cyanosis to the webbing of his toes.  All pulses remain palpable; and feet still warm.  After consultation and review of all findings with Dr. Webb Laws decision was made to send patient to the: Hospital emergency department for further evaluation and management.  Differentials should include DVT, progressive cellulitis, or a vascular issue.  Brief history and report were called to [facility] emergency department charge nurse; prior to patient transporting himself to the [facility] emergency department.

## 2015-06-07 NOTE — Assessment & Plan Note (Signed)
Patient received his last cycle of Alimta chemotherapy on 05/24/2015.  He is scheduled to return for labs, follow up visit, and his next chemotherapy on 06/06/2015.

## 2015-06-07 NOTE — Progress Notes (Signed)
SYMPTOM MANAGEMENT CLINIC   HPI: Henry [name] 64 y.o. male diagnosed with lung cancer.  Currently undergoing Alimta chemotherapy regimen.  Patient has a history of some chronic right rated and left leg edema.  He underwent a bilateral Doppler ultrasound of legs in April 2016 which was negative for DVT.  Patient reports increasing/progressive edema, erythema, and mild tenderness to his right lower leg for the past 1-2 weeks.  He called his primary care provider Dr. Donnie Coffin this past Friday, 06/01/2015; and was advised to go to a local urgent care for further evaluation.  Patient did go to an [place] urgent care this past Saturday, 06/02/2015; and was diagnosed with probable cellulitis of his right lower leg.  The area of erythema/edema was marked with permanent marker as well.  Patient was prescribed doxycycline anti-biotics at that time.  Patient states that the erythema, edema, warmth, and mild tenderness has progressed above the level of the skin mark to his right lower extremity.  He denies any recent fevers or chills.   HPI  ROS  Past Medical History  Diagnosis Date  . Hypertension   . GERD (gastroesophageal reflux disease)   . Hemochromatosis   . Hx of adenomatous colonic polyps   . Umbilical hernia     none since weight loss  . Diverticulosis   . Lumbar spondylosis     ankle  . Pleural effusion on right 12/16/13    per chest xray  . Shortness of breath     with  exertion  . Chronic pancreatitis   . IBS (irritable bowel syndrome)   . Lung cancer 06/2014  . 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 Lumbar laminectomy  1983  . Knee arthrsoscopy  2003    left  . Tonsillectomy  1962  . Eus N/A 04/07/2013    Procedure: UPPER ENDOSCOPIC ULTRASOUND (EUS) LINEAR;  Surgeon: [name], MD;  Location: [facility];  Service: Endoscopy;  Laterality: N/A;  . Video assisted thoracoscopy (vats)/decortication Right  06/22/2014    Procedure: VIDEO ASSISTED THORACOSCOPY (VATS)/DECORTICATION;  Surgeon: [name], MD;  Location: [place];  Service: Thoracic;  Laterality: Right;  . Pleural effusion drainage Right 06/22/2014    Procedure: DRAINAGE OF PLEURAL EFFUSION;  Surgeon: [name], MD;  Location: [place];  Service: Thoracic;  Laterality: Right;  . Eus N/A 07/20/2014    Procedure: UPPER ENDOSCOPIC ULTRASOUND (EUS) LINEAR;  Surgeon: [name], MD;  Location[facility];  Service: Endoscopy;  Laterality: N/A;    has HTN (hypertension); Hx of adenomatous colonic polyps; Hemochromatosis; Nonspecific (abnormal) findings on radiological and other examination of gastrointestinal tract; Unspecified gastritis and gastroduodenitis without mention of hemorrhage; Pleural effusion on right; Lung cancer; Protein-calorie malnutrition, severe; Thyroid nodule; Candida infection, esophageal; Hypokalemia; Hyperbilirubinemia; Encounter for antineoplastic chemotherapy; and Cellulitis on his problem list.    is allergic to other.    Medication List       This list is accurate as of: 06/06/15 12:17 PM.  Always use your most recent med list.               doxycycline 100 MG tablet  Commonly known as:  VIBRA-TABS  TK 1 T PO BID FOR 10 DAYS     EPIPEN 2-PAK 0.3 mg/0.3 mL Soaj injection  Generic drug:  EPINEPHrine  See admin instructions.     folic acid 1 MG tablet  Commonly known as:  FOLVITE  Take 1 tablet (1 mg total) by mouth daily.     furosemide 40 MG tablet  Commonly known as:  LASIX  Take 40 mg by mouth every morning.     ibuprofen 200 MG tablet  Commonly known as:  ADVIL,MOTRIN  Take 400 mg by mouth every 6 (six) hours as needed (Pain).     metoprolol succinate 50 MG 24 hr tablet  Commonly known as:  TOPROL-XL  Take 50 mg by mouth daily after lunch.     oxyCODONE 5 MG immediate release tablet  Commonly known as:  Oxy IR/ROXICODONE  Take 5 mg by mouth every 4 (four) hours as needed  for moderate pain.     Pancrelipase (Lip-Prot-Amyl) 20880 UNITS Tabs  Take 2 tablets by mouth 3 (three) times daily with meals.     potassium chloride SA 20 MEQ tablet  Commonly known as:  K-DUR,KLOR-CON  Take 20 mEq by mouth daily.     spironolactone 25 MG tablet  Commonly known as:  ALDACTONE  Take 25 mg by mouth daily after lunch.     UNABLE TO FIND  Med Name: Oxygen concentrator.  Per medical necessity pt needs portable oxygen concentrator via nasal cannula with activity.     verapamil 240 MG 24 hr capsule  Commonly known as:  VERELAN PM  Take 240 mg by mouth daily after lunch.         PHYSICAL EXAMINATION  Oncology Vitals 06/06/2015 06/06/2015 06/06/2015 06/06/2015 06/06/2015 06/06/2015 06/06/2015  Height - - - - - - -  Weight - - - - - - -  Weight (lbs) - - - - - - -  BMI (kg/m2) - - - - - - -  Temp - - - - - - -  Pulse 81 82 86 82 85 86 84  Resp - - - - - - -  SpO2 99 94 97 97 96 98 96  BSA (m2) - - - - - - -   BP Readings from Last 3 Encounters:  06/06/15 156/99  06/06/15 148/90  05/24/15 148/89    Physical Exam  Constitutional: He is oriented to person, place, and time.  Patient appears fatigued, slightly frail, and chronically ill.  HENT:  Head: Normocephalic and atraumatic.  Eyes: Conjunctivae and EOM are normal. Pupils are equal, round, and reactive to light. Right eye exhibits no discharge. Left eye exhibits no discharge. No scleral icterus.  Neck: Normal range of motion.  Pulmonary/Chest: Effort normal. No respiratory distress.  Musculoskeletal: Normal range of motion. He exhibits edema and tenderness.  Neurological: He is alert and oriented to person, place, and time. Gait normal.  Skin: Skin is warm and dry. No rash noted. There is erythema. There is pallor.  On exam.-It does appear that patient's right lower extremity erythema, edema, warmth, mild tenderness has now progressed above the level of the recent skin marker site.  Patient also appears to have  mild increased cyanosis to his entire right foot; with increased cyanosis to the webbing of his toes.  All pulses remain palpable; and feet still warm.    Psychiatric: Affect normal.  Nursing note and vitals reviewed.   LABORATORY DATA:. Admission on 06/06/2015, Discharged on 06/06/2015  Component Date Value Ref Range Status  . WBC 06/06/2015 3.7* 4.0 - 10.5 K/uL Final  . RBC 06/06/2015 3.42* 4.22 - 5.81 MIL/uL Final  . Hemoglobin 06/06/2015 12.5* 13.0 - 17.0 g/dL Final  . HCT 06/06/2015 34.9* 39.0 - 52.0 %  Final  . MCV 06/06/2015 102.0* 78.0 - 100.0 fL Final  . MCH 06/06/2015 36.5* 26.0 - 34.0 pg Final  . MCHC 06/06/2015 35.8  30.0 - 36.0 g/dL Final  . RDW 06/06/2015 15.7* 11.5 - 15.5 % Final  . Platelets 06/06/2015 132* 150 - 400 K/uL Final  . Neutrophils Relative % 06/06/2015 24* 43 - 77 % Final  . Lymphocytes Relative 06/06/2015 47* 12 - 46 % Final  . Monocytes Relative 06/06/2015 24* 3 - 12 % Final  . Eosinophils Relative 06/06/2015 5  0 - 5 % Final  . Basophils Relative 06/06/2015 0  0 - 1 % Final  . Neutro Abs 06/06/2015 0.9* 1.7 - 7.7 K/uL Final  . Lymphs Abs 06/06/2015 1.7  0.7 - 4.0 K/uL Final  . Monocytes Absolute 06/06/2015 0.9  0.1 - 1.0 K/uL Final  . Eosinophils Absolute 06/06/2015 0.2  0.0 - 0.7 K/uL Final  . Basophils Absolute 06/06/2015 0.0  0.0 - 0.1 K/uL Final  . Smear Review 06/06/2015 MORPHOLOGY UNREMARKABLE   Final  . Sodium 06/06/2015 141  135 - 145 mmol/L Final  . Potassium 06/06/2015 3.1* 3.5 - 5.1 mmol/L Final  . Chloride 06/06/2015 106  101 - 111 mmol/L Final  . CO2 06/06/2015 27  22 - 32 mmol/L Final  . Glucose, Bld 06/06/2015 118* 65 - 99 mg/dL Final  . BUN 06/06/2015 16  6 - 20 mg/dL Final  . Creatinine, Ser 06/06/2015 0.90  0.61 - 1.24 mg/dL Final  . Calcium 06/06/2015 8.7* 8.9 - 10.3 mg/dL Final  . GFR calc non Af Amer 06/06/2015 >60  >60 mL/min Final  . GFR calc Af Amer 06/06/2015 >60  >60 mL/min Final   Comment: (NOTE) The eGFR has been  calculated using the CKD EPI equation. This calculation has not been validated in all clinical situations. eGFR's persistently <60 mL/min signify possible Chronic Kidney Disease.   . Anion gap 06/06/2015 8  5 - 15 Final    RADIOGRAPHIC STUDIES: No results found.   Bilat legs:      ASSESSMENT/PLAN:    Cellulitis Patient has a history of some chronic right rated and left leg edema.  He underwent a bilateral Doppler ultrasound of legs in April 2016 which was negative for DVT.  Patient reports increasing/progressive edema, erythema, and mild tenderness to his right lower leg for the past 1-2 weeks.  He called his primary care provider Dr. Donnie Coffin this past Friday, 06/01/2015; and was advised to go to a local urgent care for further evaluation.  Patient did go to an [place] urgent care this past Saturday, 06/02/2015; and was diagnosed with probable cellulitis of his right lower leg.  The area of erythema/edema was marked with permanent marker as well.  Patient was prescribed doxycycline anti-biotics at that time.  Patient states that the erythema, edema, warmth, and mild tenderness has progressed above the level of the skin mark to his right lower extremity.  He denies any recent fevers or chills.  On exam.-It does appear that patient's right lower extremity erythema, edema, warmth, mild tenderness has now progressed above the level of the recent skin marker site.  Patient also appears to have mild increased cyanosis to his entire right foot; with increased cyanosis to the webbing of his toes.  All pulses remain palpable; and feet still warm.  After consultation and review of all findings with Dr. Webb Laws decision was made to send patient to the: Hospital emergency department for further evaluation and management.  Differentials should include DVT, progressive cellulitis, or a vascular issue.  Brief history and report were called to [facility] emergency department charge nurse;  prior to patient transporting himself to the [facility] emergency department.     Lung cancer Patient received his last cycle of Alimta chemotherapy on 05/24/2015.  He is scheduled to return for labs, follow up visit, and his next chemotherapy on 06/06/2015.   Patient stated understanding of all instructions; and was in agreement with this plan of care. The patient knows to call the clinic with any problems, questions or concerns.   This was a shared visit with Dr. Julien Nordmann today.  Total time spent with patient was 40 minutes;  with greater than 75 percent of that time spent in face to face counseling regarding patient's symptoms,  and coordination of care and follow up.  Disclaimer: This note was dictated with voice recognition software. Similar sounding words can inadvertently be transcribed and may not be corrected upon review.   Drue Second, NP 06/07/2015   ADDENDUM: Hematology/Oncology Attending: I had a face to face encounter with the patient. I recommended his care plan. This is a very pleasant 64 years old white male with history of stage IV non-small cell lung cancer currently undergoing treatment with maintenance Alimta and tolerating it fairly well. The patient presented to the clinic today complaining of swelling and erythema of the right lower extremity that has been getting worse. He was seen at the urgent care Center and was treated for questionable cellulitis of the right lower extremity but no improvement in his condition and erythema and edema are getting worse. I strongly recommended for the patient to go immediately to [facility] for further evaluation and to rule out deep venous thrombosis as well as vascular compromise and also for consideration of admission because of failure of the outpatient treatment for the questionable cellulitis. The patient agreed to the current plan. He would come back for follow-up visit as previously scheduled. He was advised to  call immediately if he has any concerning symptoms in the interval.  Disclaimer: This note was dictated with voice recognition software. Similar sounding words can inadvertently be transcribed and may be missed upon review. Eilleen Kempf., MD 06/10/2015

## 2015-06-11 ENCOUNTER — Telehealth: Payer: Self-pay | Admitting: *Deleted

## 2015-06-11 ENCOUNTER — Ambulatory Visit: Payer: BLUE CROSS/BLUE SHIELD

## 2015-06-11 ENCOUNTER — Telehealth: Payer: Self-pay

## 2015-06-11 ENCOUNTER — Other Ambulatory Visit: Payer: Self-pay | Admitting: Medical Oncology

## 2015-06-11 ENCOUNTER — Telehealth: Payer: Self-pay | Admitting: Internal Medicine

## 2015-06-11 DIAGNOSIS — R5381 Other malaise: Secondary | ICD-10-CM

## 2015-06-11 DIAGNOSIS — R29898 Other symptoms and signs involving the musculoskeletal system: Secondary | ICD-10-CM

## 2015-06-11 DIAGNOSIS — R0602 Shortness of breath: Secondary | ICD-10-CM

## 2015-06-11 NOTE — Telephone Encounter (Signed)
S/w pt: the cellulitis is getting very slowly better. He is taking his clindamycin QID. Let the pt know that Dr Julien Nordmann is postponing his treatment for 1 week and to expect a call from scheduler.

## 2015-06-11 NOTE — Therapy (Signed)
[place], [place], [address] Phone: [phone]   Fax:  [phone]  Physical Therapy Treatment  Patient Details  Name: Henry [name] MRN: [id number] Date of Birth: 18-Jun-1951 Referring Provider:  Curt Bears, MD  Encounter Date: 06/11/2015      PT End of Session - 06/11/15 1216    Visit Number 2   Number of Visits 16   Date for PT Re-Evaluation 08/17/15   PT Start Time 2694   PT Stop Time 1207   PT Time Calculation (min) 56 min   Activity Tolerance Patient tolerated treatment well;Other (comment)  Slightly limited at times due to feeling SOB, though SpO2 actually overall did very well staying at 94-98%   Behavior During Therapy [facility] for tasks assessed/performed      Past Medical History  Diagnosis Date  . Hypertension   . GERD (gastroesophageal reflux disease)   . Hemochromatosis   . Hx of adenomatous colonic polyps   . Umbilical hernia     none since weight loss  . Diverticulosis   . Lumbar spondylosis     ankle  . Pleural effusion on right 12/16/13    per chest xray  . Shortness of breath     with  exertion  . Chronic pancreatitis   . IBS (irritable bowel syndrome)   . Lung cancer 06/2014  . 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 Lumbar laminectomy  1983  . Knee arthrsoscopy  2003    left  . Tonsillectomy  1962  . Eus N/A 04/07/2013    Procedure: UPPER ENDOSCOPIC ULTRASOUND (EUS) LINEAR;  Surgeon: [name], MD;  Location: [facility];  Service: Endoscopy;  Laterality: N/A;  . Video assisted thoracoscopy (vats)/decortication Right 06/22/2014    Procedure: VIDEO ASSISTED THORACOSCOPY (VATS)/DECORTICATION;  Surgeon: [name], MD;  Location: [place];  Service: Thoracic;  Laterality: Right;  . Pleural effusion drainage Right 06/22/2014    Procedure: DRAINAGE OF PLEURAL EFFUSION;  Surgeon: [name], MD;   Location: [place];  Service: Thoracic;  Laterality: Right;  . Eus N/A 07/20/2014    Procedure: UPPER ENDOSCOPIC ULTRASOUND (EUS) LINEAR;  Surgeon: [name], MD;  Location: [facility];  Service: Endoscopy;  Laterality: N/A;    There were no vitals filed for this visit.  Visit Diagnosis:  Physical deconditioning  Shortness of breath on exertion  Weakness of both legs      Subjective Assessment - 06/11/15 1118    Subjective I'm on a antibitioic for cellulitis in my Rt foot, got that at the ED last Thursday. Overall, feel about normal today. Would really like to get my thighs stronger to be able to get up/down out of car easier and go up my steps better.             [facility] PT Assessment - 06/11/15 0001    6 minute walk test results    Aerobic Endurance Distance Walked 717   Endurance additional comments Patient needed one standing rest break to allow for SpO2 to increase from 92%, came back upto 97% within 15-30 seconds. PRE 5 after walking.                     [facility] Adult PT Treatment/Exercise - 06/11/15 0001    Knee/Hip Exercises: Stretches   Active Hamstring Stretch Both;3 reps;20 seconds  Seated in chair   Knee/Hip Exercises: Aerobic  Recumbent Bike 2 minutes SpO2 94-97% after PRE 7   Knee/Hip Exercises: Standing   Heel Raises Limitations Unable to do due to nerve damage from previous back surgery   Hip Extension Stengthening;Both;1 set;5 reps  3 second holds   Extension Limitations Cuing for knee straight and erect posture   Knee/Hip Exercises: Seated   Long Arc Quad AROM;Both;5 reps;2 sets  5 second holds   Long Technical sales engineer for correct technique   Knee/Hip Exercises: Supine   Bridges 1 set;Strengthening;5 reps   Straight Leg Raises Strengthening;Both;1 set;5 reps  3 second holds                PT Education - 06/11/15 1215    Education provided Yes   Education Details Bil LE strength exercises; also patient to start riding  his recumbent bike if he can find his O2 monitor and knows to stop if he drops below 92% to be safe.   Person(s) Educated Patient   Methods Explanation;Demonstration;Handout   Comprehension Verbalized understanding;Returned demonstration;Need further instruction           Short Term Clinic Goals - 05/30/15 2322    CC Short Term Goal  #1   Title independent with home exercise program for strengthening and endurance, performed safely   Time 4   Period Weeks   Status New   CC Short Term Goal  #2   Title will report perception of decreased fatigue since starting exercise   Time 4   Period [facility] Goals - 05/30/15 2323    CC Long Term Goal  #1   Title will report perception of at least 20% improvement in energy level   Time 8   Period Weeks   Status New   CC Long Term Goal  #2   Title lower extremity strength at least 4/5 throughout   Time 8   Period Weeks   Status New   CC Long Term Goal  #3   Title will report perception of at least 20% decrease in shortness of breath on exertion   Time 8   Period Weeks   Status New   CC Long Term Goal  #4   Title will improve 6 minute walk test distance at least 25%   Time 8   Period Weeks   Status New            Plan - 06/11/15 1217    Clinical Impression Statement Patient tolerated treatment pretty well today, had some increase SOB with some activities. 6 minute walk test was over 700 feet with PRE of 4-5 after and SpO2 lowered to 92% after about 4:30 mins and came back up with in 30 sec to 97%.He felt the most SOB with recumbent bike and bridging. SpO2 did well averaging between 94-97% with all activities. Initial HEP issued today and patient demonstrated good initial understanding of this.    Pt will benefit from skilled therapeutic intervention in order to improve on the following deficits Cardiopulmonary status limiting activity;Decreased endurance;Decreased strength   Rehab  Potential Good   PT Frequency 2x / week   PT Duration 8 weeks   PT Treatment/Interventions Therapeutic exercise;Functional mobility training;Neuromuscular re-education;Patient/family education   PT Next Visit Plan Monitor O2 saturation prn throughout treatment; do UE manual muscle test.  Then continue ther ex to improve strength and endurance; teach HEP.   PT  Home Exercise Plan Bil LE initial HEP added today   Consulted and Agree with Plan of Care Patient        Problem List Patient Active Problem List   Diagnosis Date Noted  . Cellulitis 06/07/2015  . Encounter for antineoplastic chemotherapy 05/05/2015  . Hypokalemia 08/31/2014  . Hyperbilirubinemia 08/31/2014  . Candida infection, esophageal 08/30/2014  . Thyroid nodule 07/18/2014  . Protein-calorie malnutrition, severe 06/26/2014  . Lung cancer 06/22/2014  . Pleural effusion on right 12/16/2013  . Nonspecific (abnormal) findings on radiological and other examination of gastrointestinal tract 04/07/2013  . Unspecified gastritis and gastroduodenitis without mention of hemorrhage 04/07/2013  . HTN (hypertension) 01/31/2013  . Hx of adenomatous colonic polyps 01/31/2013  . Hemochromatosis 01/31/2013    Otelia Limes, PTA 06/11/2015, 12:26 PM  [place], [place], [address] Phone: [phone]   Fax:  [phone]

## 2015-06-11 NOTE — Telephone Encounter (Signed)
Confirmed appointment moved to 08/04

## 2015-06-11 NOTE — Telephone Encounter (Signed)
Delay chemo by one week to allow for treatment of the cellulitis. Please reschedule chemo, lab and appointment.

## 2015-06-11 NOTE — Patient Instructions (Signed)
Long [employer]   Straighten operated leg and try to hold it __5__ seconds. Use __1-3__ lbs on ankle. Repeat _10__ times. Do _2-3___ sessions a day.  Hip Flexion / Knee Extension: Straight-Leg Raise (Eccentric)   Lie on back. Lift leg with knee straight. Slowly lower leg for 3-5 seconds. _5-10__ reps per set, _2-3__ sets per day. Can add _1-3__ lbs.    Copyright  VHI. All rights reserved.  Knee Extension (Hamstring Stretch) With Pelvis Neutral   Slowly and gently bring foot up. Be sure pelvis does not tip backward (flex) or rotate. Hold _20__ seconds. Do _3__ times, each leg, _2-3__ times per day.  http://ss.exer.us/88   Copyright  VHI. All rights reserved.  Hamstring Stretch - Supine   Lie on back, uninvolved leg raised toward ceiling, towel around knee. Pull thigh toward chest until a stretch is felt on back of thigh. Hold for _20__ seconds. If possible, move towel up to calf and repeat. Repeat on involved leg. Repeat _3__ times. Do _1-2__ times per day.  Copyright  VHI. All rights reserved.     Standing at kitchen counter: Hip extension (back leg swings) holding 3 seconds 10 reps each leg, keeping chest up.  1-2 sets at a time.   Sit to stand throughout day (use desk or dining chair) 5-10 reps at a time, 2-3 times a day.

## 2015-06-11 NOTE — Telephone Encounter (Signed)
Voicemail retrieved.  "I need to know if I need to bother coming in Thursday.  I have cellulitis.  Do I reschedule or come in?  Return number is [phone]."  06-14-2015 starting at 0900 for lab/MD/Alimta Treatment.

## 2015-06-13 ENCOUNTER — Encounter (HOSPITAL_COMMUNITY): Admission: RE | Payer: Self-pay | Source: Ambulatory Visit

## 2015-06-13 SURGERY — COLONOSCOPY WITH PROPOFOL
Anesthesia: Monitor Anesthesia Care

## 2015-06-14 ENCOUNTER — Other Ambulatory Visit: Payer: BLUE CROSS/BLUE SHIELD

## 2015-06-14 ENCOUNTER — Ambulatory Visit: Payer: BLUE CROSS/BLUE SHIELD | Admitting: Internal Medicine

## 2015-06-14 ENCOUNTER — Ambulatory Visit: Payer: BLUE CROSS/BLUE SHIELD

## 2015-06-18 ENCOUNTER — Ambulatory Visit: Payer: BLUE CROSS/BLUE SHIELD | Admitting: Physical Therapy

## 2015-06-20 ENCOUNTER — Ambulatory Visit: Payer: BLUE CROSS/BLUE SHIELD | Attending: Internal Medicine | Admitting: Physical Therapy

## 2015-06-20 VITALS — HR 91

## 2015-06-20 DIAGNOSIS — R5381 Other malaise: Secondary | ICD-10-CM

## 2015-06-20 DIAGNOSIS — R29898 Other symptoms and signs involving the musculoskeletal system: Secondary | ICD-10-CM | POA: Insufficient documentation

## 2015-06-20 DIAGNOSIS — R0602 Shortness of breath: Secondary | ICD-10-CM | POA: Insufficient documentation

## 2015-06-20 NOTE — Therapy (Signed)
[place], [place], [address] Phone: [phone]   Fax:  [phone]  Physical Therapy Treatment  Patient Details  Name: Henry [name] MRN: [id number] Date of Birth: Apr 01, 1951 Referring Provider:  Curt Bears, MD  Encounter Date: 06/20/2015      PT End of Session - 06/20/15 1158    Visit Number 3   Number of Visits 16   Date for PT Re-Evaluation 08/17/15   PT Start Time 0935   PT Stop Time 1022   PT Time Calculation (min) 47 min   Activity Tolerance Patient limited by fatigue   Behavior During Therapy [facility] for tasks assessed/performed      Past Medical History  Diagnosis Date  . Hypertension   . GERD (gastroesophageal reflux disease)   . Hemochromatosis   . Hx of adenomatous colonic polyps   . Umbilical hernia     none since weight loss  . Diverticulosis   . Lumbar spondylosis     ankle  . Pleural effusion on right 12/16/13    per chest xray  . Shortness of breath     with  exertion  . Chronic pancreatitis   . IBS (irritable bowel syndrome)   . Lung cancer 06/2014  . 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 Lumbar laminectomy  1983  . Knee arthrsoscopy  2003    left  . Tonsillectomy  1962  . Eus N/A 04/07/2013    Procedure: UPPER ENDOSCOPIC ULTRASOUND (EUS) LINEAR;  Surgeon: [name], MD;  [facility];  Service: Endoscopy;  Laterality: N/A;  . Video assisted thoracoscopy (vats)/decortication Right 06/22/2014    Procedure: VIDEO ASSISTED THORACOSCOPY (VATS)/DECORTICATION;  Surgeon: [name], MD;  Location: [place];  Service: Thoracic;  Laterality: Right;  . Pleural effusion drainage Right 06/22/2014    Procedure: DRAINAGE OF PLEURAL EFFUSION;  Surgeon: [name], MD;  Location: [place];  Service: Thoracic;  Laterality: Right;  . Eus N/A 07/20/2014    Procedure: UPPER ENDOSCOPIC ULTRASOUND (EUS)  LINEAR;  Surgeon: [name], MD;  [facility];  Service: Endoscopy;  Laterality: N/A;    Filed Vitals:   06/20/15 8563 06/20/15 1003 06/20/15 1006 06/20/15 1021  Pulse: 86 91 98 91  SpO2: 96% 95% 95% 95%    Visit Diagnosis:  Physical deconditioning  Shortness of breath on exertion  Weakness of both legs      Subjective Assessment - 06/20/15 0936    Subjective "I am tired today.  Just how it goes."  "Last time I was here you said something about something for compression at nighttime."  Saw doctor for follow-up on cellulitis; he doesn't think it's cellulitis so referred pt. to vascular specialist (goes next week).  Does see MD for follow-up today as well.  Has done HEP about twice since last session; fatigue has kept him from doing more.                                                       Currently in Pain? Yes   Pain Score 2    Pain Location Other (Comment)  shin   Pain Orientation Right   Aggravating Factors  compression   Pain Relieving Factors not coompression  [facility] PT Assessment - 06/20/15 0001    Observation/Other Assessments   Skin Integrity distal right leg skin is reddened but not warm to the touch today                     [facility] Adult PT Treatment/Exercise - 06/20/15 0001    Self-Care   Self-Care Other Self-Care Comments   Other Self-Care Comments  showed patient samples of nighttime compression garment options and discussed these   Knee/Hip Exercises: Aerobic   Recumbent Bike 3 minutes   then 3 more minutes, keeping RPMs >30 @ level 1;leg fatigue   Knee/Hip Exercises: Standing   Hip Abduction Stengthening  yellow theraband resisted sideways gait x 30 feet each way   Step Down Right;Left;10 reps;Hand Hold: 1;Step Height: 4"   Other Standing Knee Exercises tried sit to stand without UE support from chair, but unable   Knee/Hip Exercises: Seated   Long Arc Quad 1 set;10 reps   Long Arc Quad Weight --  yellow  theraband   Abduction/Adduction  Strengthening   Abd/Adduction Weights --  isometric ball squeezes 3 counts x 10                   Short Term Clinic Goals - 06/20/15 1202    CC Short Term Goal  #1   Status On-going   CC Short Term Goal  #2   Status On-going             Long Term Clinic Goals - 06/20/15 1202    CC Long Term Goal  #1   Status On-going   CC Long Term Goal  #2   Status On-going   CC Long Term Goal  #3   Status On-going   CC Long Term Goal  #4   Status On-going            Plan - 06/20/15 1021    Clinical Impression Statement Pt. reports  muscles feel more tired but he feels less lethargic after session today.  SpO2 stayed in 95-95% range during exercise.  He may consider a nighttime compression garment, but is concerned about cost; in any case, he wants to wait until after he sees a vascular specialist.   Pt will benefit from skilled therapeutic intervention in order to improve on the following deficits Cardiopulmonary status limiting activity;Decreased endurance;Decreased strength   Rehab Potential Good   PT Frequency 2x / week   PT Duration 8 weeks   PT Treatment/Interventions Patient/family education;ADLs/Self Care Home Management;Therapeutic exercise   PT Next Visit Plan Try more with step-ups and step-downs, resisted walking, sit to stand.  Continue to progress endurance activity.        Problem List Patient Active Problem List   Diagnosis Date Noted  . Cellulitis 06/07/2015  . Encounter for antineoplastic chemotherapy 05/05/2015  . Hypokalemia 08/31/2014  . Hyperbilirubinemia 08/31/2014  . Candida infection, esophageal 08/30/2014  . Thyroid nodule 07/18/2014  . Protein-calorie malnutrition, severe 06/26/2014  . Lung cancer 06/22/2014  . Pleural effusion on right 12/16/2013  . Nonspecific (abnormal) findings on radiological and other examination of gastrointestinal tract 04/07/2013  . Unspecified gastritis and gastroduodenitis  without mention of hemorrhage 04/07/2013  . HTN (hypertension) 01/31/2013  . Hx of adenomatous colonic polyps 01/31/2013  . Hemochromatosis 01/31/2013    Rafe Mackowski 06/20/2015, 12:04 PM  [place], [place], [address] Phone: [phone]   Fax:  [phone]  Serafina Royals, PT 06/20/2015 12:04 PM

## 2015-06-20 NOTE — Patient Instructions (Signed)
Try to do home exercise program more regularly.  If unable to do all of it, emphasis should be on pedaling stationary bike to increase endurance, decrease fatigue.

## 2015-06-21 ENCOUNTER — Other Ambulatory Visit (HOSPITAL_BASED_OUTPATIENT_CLINIC_OR_DEPARTMENT_OTHER): Payer: BLUE CROSS/BLUE SHIELD

## 2015-06-21 ENCOUNTER — Encounter: Payer: Self-pay | Admitting: Internal Medicine

## 2015-06-21 ENCOUNTER — Telehealth: Payer: Self-pay | Admitting: Internal Medicine

## 2015-06-21 ENCOUNTER — Ambulatory Visit (HOSPITAL_BASED_OUTPATIENT_CLINIC_OR_DEPARTMENT_OTHER): Payer: BLUE CROSS/BLUE SHIELD

## 2015-06-21 ENCOUNTER — Ambulatory Visit (HOSPITAL_BASED_OUTPATIENT_CLINIC_OR_DEPARTMENT_OTHER): Payer: BLUE CROSS/BLUE SHIELD | Admitting: Internal Medicine

## 2015-06-21 VITALS — BP 149/80 | HR 91 | Temp 98.4°F | Resp 18 | Ht 71.0 in | Wt 159.9 lb

## 2015-06-21 DIAGNOSIS — C801 Malignant (primary) neoplasm, unspecified: Secondary | ICD-10-CM | POA: Diagnosis not present

## 2015-06-21 DIAGNOSIS — C3490 Malignant neoplasm of unspecified part of unspecified bronchus or lung: Secondary | ICD-10-CM

## 2015-06-21 DIAGNOSIS — C3491 Malignant neoplasm of unspecified part of right bronchus or lung: Secondary | ICD-10-CM

## 2015-06-21 DIAGNOSIS — Z5111 Encounter for antineoplastic chemotherapy: Secondary | ICD-10-CM

## 2015-06-21 DIAGNOSIS — E876 Hypokalemia: Secondary | ICD-10-CM

## 2015-06-21 LAB — CBC WITH DIFFERENTIAL/PLATELET
BASO%: 0.5 % (ref 0.0–2.0)
Basophils Absolute: 0 10*3/uL (ref 0.0–0.1)
EOS ABS: 0.2 10*3/uL (ref 0.0–0.5)
EOS%: 1.8 % (ref 0.0–7.0)
HCT: 40.4 % (ref 38.4–49.9)
HGB: 14 g/dL (ref 13.0–17.1)
LYMPH%: 18.6 % (ref 14.0–49.0)
MCH: 36.2 pg — AB (ref 27.2–33.4)
MCHC: 34.7 g/dL (ref 32.0–36.0)
MCV: 104.4 fL — AB (ref 79.3–98.0)
MONO#: 1 10*3/uL — ABNORMAL HIGH (ref 0.1–0.9)
MONO%: 11 % (ref 0.0–14.0)
NEUT%: 68.1 % (ref 39.0–75.0)
NEUTROS ABS: 6 10*3/uL (ref 1.5–6.5)
Platelets: 288 10*3/uL (ref 140–400)
RBC: 3.87 10*6/uL — ABNORMAL LOW (ref 4.20–5.82)
RDW: 16.5 % — ABNORMAL HIGH (ref 11.0–14.6)
WBC: 8.8 10*3/uL (ref 4.0–10.3)
lymph#: 1.6 10*3/uL (ref 0.9–3.3)

## 2015-06-21 LAB — COMPREHENSIVE METABOLIC PANEL (CC13)
ALBUMIN: 3.3 g/dL — AB (ref 3.5–5.0)
ALT: 17 U/L (ref 0–55)
AST: 23 U/L (ref 5–34)
Alkaline Phosphatase: 87 U/L (ref 40–150)
Anion Gap: 6 mEq/L (ref 3–11)
BILIRUBIN TOTAL: 0.99 mg/dL (ref 0.20–1.20)
BUN: 12.7 mg/dL (ref 7.0–26.0)
CO2: 30 mEq/L — ABNORMAL HIGH (ref 22–29)
CREATININE: 0.9 mg/dL (ref 0.7–1.3)
Calcium: 9.1 mg/dL (ref 8.4–10.4)
Chloride: 104 mEq/L (ref 98–109)
EGFR: >90 mL/min/{1.73_m2} (ref >90)
GLUCOSE: 172 mg/dL — AB (ref 70–140)
POTASSIUM: 3.7 meq/L (ref 3.5–5.1)
SODIUM: 141 meq/L (ref 136–145)
Total Protein: 6.5 g/dL (ref 6.4–8.3)

## 2015-06-21 MED ORDER — PEMETREXED DISODIUM CHEMO INJECTION 500 MG
1000.0000 mg | Freq: Once | INTRAVENOUS | Status: AC
  Administered 2015-06-21: 1000 mg via INTRAVENOUS
  Filled 2015-06-21: qty 40

## 2015-06-21 MED ORDER — OXYCODONE HCL 5 MG PO TABS: 5.0000 mg | ORAL_TABLET | ORAL | Status: DC | PRN

## 2015-06-21 MED ORDER — SODIUM CHLORIDE 0.9 % IV SOLN
Freq: Once | INTRAVENOUS | Status: AC
  Administered 2015-06-21: 11:00:00 via INTRAVENOUS

## 2015-06-21 MED ORDER — CYANOCOBALAMIN 1000 MCG/ML IJ SOLN
INTRAMUSCULAR | Status: AC
  Filled 2015-06-21: qty 1

## 2015-06-21 MED ORDER — CYANOCOBALAMIN 1000 MCG/ML IJ SOLN
1000.0000 ug | Freq: Once | INTRAMUSCULAR | Status: AC
  Administered 2015-06-21: 1000 ug via INTRAMUSCULAR

## 2015-06-21 MED ORDER — SODIUM CHLORIDE 0.9 % IV SOLN
Freq: Once | INTRAVENOUS | Status: AC
  Administered 2015-06-21: 11:00:00 via INTRAVENOUS
  Filled 2015-06-21: qty 4

## 2015-06-21 NOTE — Patient Instructions (Signed)
[facility] Discharge Instructions for Patients Receiving Chemotherapy  Today you received the following chemotherapy agents: alimta  To help prevent nausea and vomiting after your treatment, we encourage you to take your nausea medication.  Take it as often as prescribed.     If you develop nausea and vomiting that is not controlled by your nausea medication, call the clinic. If it is after clinic hours your family physician or the after hours number for the clinic or go to the Emergency Department.   BELOW ARE SYMPTOMS THAT SHOULD BE REPORTED IMMEDIATELY:  *FEVER GREATER THAN 100.5 F  *CHILLS WITH OR WITHOUT FEVER  NAUSEA AND VOMITING THAT IS NOT CONTROLLED WITH YOUR NAUSEA MEDICATION  *UNUSUAL SHORTNESS OF BREATH  *UNUSUAL BRUISING OR BLEEDING  TENDERNESS IN MOUTH AND THROAT WITH OR WITHOUT PRESENCE OF ULCERS  *URINARY PROBLEMS  *BOWEL PROBLEMS  UNUSUAL RASH Items with * indicate a potential emergency and should be followed up as soon as possible.  Feel free to call the clinic you have any questions or concerns. The clinic phone number is [phone].   I have been informed and understand all the instructions given to me. I know to contact the clinic, my physician, or go to the Emergency Department if any problems should occur. I do not have any questions at this time, but understand that I may call the clinic during office hours   should I have any questions or need assistance in obtaining follow up care.    __________________________________________  _____________  __________ Signature of Patient or Authorized Representative            Date                   Time    __________________________________________ Nurse's Signature    

## 2015-06-21 NOTE — Patient Instructions (Signed)
Smoking Cessation Quitting smoking is important to your health and has many advantages. However, it is not always easy to quit since nicotine is a very addictive drug. Oftentimes, people try 3 times or more before being able to quit. This document explains the best ways for you to prepare to quit smoking. Quitting takes hard work and a lot of effort, but you can do it. ADVANTAGES OF QUITTING SMOKING  You will live longer, feel better, and live better.  Your body will feel the impact of quitting smoking almost immediately.  Within 20 minutes, blood pressure decreases. Your pulse returns to its normal level.  After 8 hours, carbon monoxide levels in the blood return to normal. Your oxygen level increases.  After 24 hours, the chance of having a heart attack starts to decrease. Your breath, hair, and body stop smelling like smoke.  After 48 hours, damaged nerve endings begin to recover. Your sense of taste and smell improve.  After 72 hours, the body is virtually free of nicotine. Your bronchial tubes relax and breathing becomes easier.  After 2 to 12 weeks, lungs can hold more air. Exercise becomes easier and circulation improves.  The risk of having a heart attack, stroke, cancer, or lung disease is greatly reduced.  After 1 year, the risk of coronary heart disease is cut in half.  After 5 years, the risk of stroke falls to the same as a nonsmoker.  After 10 years, the risk of lung cancer is cut in half and the risk of other cancers decreases significantly.  After 15 years, the risk of coronary heart disease drops, usually to the level of a nonsmoker.  If you are pregnant, quitting smoking will improve your chances of having a healthy baby.  The people you live with, especially any children, will be healthier.  You will have extra money to spend on things other than cigarettes. QUESTIONS TO THINK ABOUT BEFORE ATTEMPTING TO QUIT You may want to talk about your answers with your  health care provider.  Why do you want to quit?  If you tried to quit in the past, what helped and what did not?  What will be the most difficult situations for you after you quit? How will you plan to handle them?  Who can help you through the tough times? Your family? Friends? A health care provider?  What pleasures do you get from smoking? What ways can you still get pleasure if you quit? Here are some questions to ask your health care provider:  How can you help me to be successful at quitting?  What medicine do you think would be best for me and how should I take it?  What should I do if I need more help?  What is smoking withdrawal like? How can I get information on withdrawal? GET READY  Set a quit date.  Change your environment by getting rid of all cigarettes, ashtrays, matches, and lighters in your home, car, or work. Do not let people smoke in your home.  Review your past attempts to quit. Think about what worked and what did not. GET SUPPORT AND ENCOURAGEMENT You have a better chance of being successful if you have help. You can get support in many ways.  Tell your family, friends, and coworkers that you are going to quit and need their support. Ask them not to smoke around you.  Get individual, group, or telephone counseling and support. Programs are available at local hospitals and health centers. Call   your local health department for information about programs in your area.  Spiritual beliefs and practices may help some smokers quit.  Download a "quit meter" on your computer to keep track of quit statistics, such as how long you have gone without smoking, cigarettes not smoked, and money saved.  Get a self-help book about quitting smoking and staying off tobacco. LEARN NEW SKILLS AND BEHAVIORS  Distract yourself from urges to smoke. Talk to someone, go for a walk, or occupy your time with a task.  Change your normal routine. Take a different route to work.  Drink tea instead of coffee. Eat breakfast in a different place.  Reduce your stress. Take a hot bath, exercise, or read a book.  Plan something enjoyable to do every day. Reward yourself for not smoking.  Explore interactive web-based programs that specialize in helping you quit. GET MEDICINE AND USE IT CORRECTLY Medicines can help you stop smoking and decrease the urge to smoke. Combining medicine with the above behavioral methods and support can greatly increase your chances of successfully quitting smoking.  Nicotine replacement therapy helps deliver nicotine to your body without the negative effects and risks of smoking. Nicotine replacement therapy includes nicotine gum, lozenges, inhalers, nasal sprays, and skin patches. Some may be available over-the-counter and others require a prescription.  Antidepressant medicine helps people abstain from smoking, but how this works is unknown. This medicine is available by prescription.  Nicotinic receptor partial agonist medicine simulates the effect of nicotine in your brain. This medicine is available by prescription. Ask your health care provider for advice about which medicines to use and how to use them based on your health history. Your health care provider will tell you what side effects to look out for if you choose to be on a medicine or therapy. Carefully read the information on the package. Do not use any other product containing nicotine while using a nicotine replacement product.  RELAPSE OR DIFFICULT SITUATIONS Most relapses occur within the first 3 months after quitting. Do not be discouraged if you start smoking again. Remember, most people try several times before finally quitting. You may have symptoms of withdrawal because your body is used to nicotine. You may crave cigarettes, be irritable, feel very hungry, cough often, get headaches, or have difficulty concentrating. The withdrawal symptoms are only temporary. They are strongest  when you first quit, but they will go away within 10-14 days. To reduce the chances of relapse, try to:  Avoid drinking alcohol. Drinking lowers your chances of successfully quitting.  Reduce the amount of caffeine you consume. Once you quit smoking, the amount of caffeine in your body increases and can give you symptoms, such as a rapid heartbeat, sweating, and anxiety.  Avoid smokers because they can make you want to smoke.  Do not let weight gain distract you. Many smokers will gain weight when they quit, usually less than 10 pounds. Eat a healthy diet and stay active. You can always lose the weight gained after you quit.  Find ways to improve your mood other than smoking. FOR MORE INFORMATION  www.smokefree.gov  Document Released: 10/28/2001 Document Revised: 03/20/2014 Document Reviewed: 02/12/2012 Exit[employer] Patient Information 2015 [employer]. This information is not intended to replace advice given to you by your health care provider. Make sure you discuss any questions you have with your health care provider.  

## 2015-06-21 NOTE — Telephone Encounter (Signed)
Added appt per pof...the patient will get all appts in chemo

## 2015-06-21 NOTE — Progress Notes (Signed)
[place] [phone]   [phone]  OFFICE PROGRESS NOTE  Donnie Coffin, [address]  [place] [address]  DIAGNOSIS: Metastatic adenocarcinoma of unknown primary but questionable for upper gastrointestinal, pancreaticobiliary or primary lung cancer, diagnosed in August of 2015.  Genomic Alterations Identified? FGFR2 A97T KRAS Q61H - subclonal? ARID1A [id number]* - subclonal?  PRIOR THERAPY:  1) Status post right video-assisted thoracoscopy, drainage of pleural effusion, pleural biopsy, partial decortication, talc pleurodesis.under the care of Dr. Roxan Hockey on 06/23/2014. 2) Systemic chemotherapy with carboplatin for AUC of 5 and Alimta 500 mg/M2 every 3 weeks. He is status post 6 cycles, with stable disease after cycle #6.  CURRENT THERAPY: Maintenance chemotherapy with single agent Alimta 500 MG/M2 every 3 weeks. First dose 12/20/2014. He is status post 8 cycles.  INTERVAL HISTORY: Henry [name] 64 y.o. male returns to the clinic today for follow-up visit. The patient tolerated the last cycle of her systemic chemotherapy fairly well with no significant adverse effects. He was recently treated for questionable cellulitis of the right lower extremity but she also has peripheral vascular disease. He has some soreness in his mouth for a few days after the treatment with Alimta but he also eats a lot of spicy food. The patient denied having any significant chest pain, shortness of breath, cough or hemoptysis. He has no significant weight loss or night sweats. He is here today to start cycle #9 of his systemic chemotherapy.  MEDICAL HISTORY: Past Medical History  Diagnosis Date  . Hypertension   . GERD (gastroesophageal reflux disease)   . Hemochromatosis   . Hx of adenomatous colonic polyps   . Umbilical hernia     none since weight loss  . Diverticulosis   . Lumbar spondylosis     ankle  . Pleural effusion on right  12/16/13    per chest xray  . Shortness of breath     with  exertion  . Chronic pancreatitis   . IBS (irritable bowel syndrome)   . Lung cancer 06/2014  . 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  MEDICATIONS:  Current Outpatient Prescriptions  Medication Sig Dispense Refill  . clindamycin (CLEOCIN) 150 MG capsule Take 1 capsule (150 mg total) by mouth every 6 (six) hours. 28 capsule 0  . doxycycline (VIBRA-TABS) 100 MG tablet TK 1 T PO BID FOR 10 DAYS  0  . EPIPEN 2-PAK 0.3 MG/0.3ML SOAJ injection See admin instructions.  1  . folic acid (FOLVITE) 1 MG tablet Take 1 tablet (1 mg total) by mouth daily. 30 tablet 3  . furosemide (LASIX) 40 MG tablet Take 40 mg by mouth every morning.   11  . ibuprofen (ADVIL,MOTRIN) 200 MG tablet Take 400 mg by mouth every 6 (six) hours as needed (Pain).    . metoprolol succinate (TOPROL-XL) 50 MG 24 hr tablet Take 50 mg by mouth daily after lunch.   12  . oxyCODONE (OXY IR/ROXICODONE) 5 MG immediate release tablet Take 5 mg by mouth every 4 (four) hours as needed for moderate pain.   0  . Pancrelipase, Lip-Prot-Amyl, 20880 UNITS TABS Take 2 tablets by mouth 3 (three) times daily with meals. (Patient not taking: Reported on 05/31/2015) 540 tablet 2  . potassium chloride SA (K-DUR,KLOR-CON) 20 MEQ tablet Take 20 mEq by mouth daily.    Marland Kitchen spironolactone (ALDACTONE) 25 MG tablet Take 25 mg by mouth daily  after lunch.    Marland Kitchen UNABLE TO FIND Med Name: Oxygen concentrator.  Per medical necessity pt needs portable oxygen concentrator via nasal cannula with activity. 1 each 1  . verapamil (VERELAN PM) 240 MG 24 hr capsule Take 240 mg by mouth daily.  11   No current facility-administered medications for this visit.    SURGICAL HISTORY:  Past Surgical History  Procedure Laterality Date  . Lumbar laminectomy  1983  . Knee arthrsoscopy  2003    left  . Tonsillectomy  1962  . Eus N/A 04/07/2013    Procedure: UPPER ENDOSCOPIC  ULTRASOUND (EUS) LINEAR;  Surgeon: [name], MD;  Location: [facility];  Service: Endoscopy;  Laterality: N/A;  . Video assisted thoracoscopy (vats)/decortication Right 06/22/2014    Procedure: VIDEO ASSISTED THORACOSCOPY (VATS)/DECORTICATION;  Surgeon: [name], MD;  Location: [facility];  Service: Thoracic;  Laterality: Right;  . Pleural effusion drainage Right 06/22/2014    Procedure: DRAINAGE OF PLEURAL EFFUSION;  Surgeon: [name], MD;  Location: [facility];  Service: Thoracic;  Laterality: Right;  . Eus N/A 07/20/2014    Procedure: UPPER ENDOSCOPIC ULTRASOUND (EUS) LINEAR;  Surgeon: [name], MD;  Location: [facility];  Service: Endoscopy;  Laterality: N/A;    REVIEW OF SYSTEMS:  Constitutional: positive for fatigue Eyes: negative Ears, nose, mouth, throat, and face: positive for sore mouth Respiratory: negative Cardiovascular: negative Gastrointestinal: negative Genitourinary:negative Integument/breast: negative Hematologic/lymphatic: negative Musculoskeletal:negative Neurological: negative Behavioral/Psych: negative Endocrine: negative Allergic/Immunologic: negative   PHYSICAL EXAMINATION: General appearance: alert, cooperative and no distress Head: Normocephalic, without obvious abnormality, atraumatic Neck: no adenopathy, no JVD, supple, symmetrical, trachea midline and thyroid not enlarged, symmetric, no tenderness/mass/nodules Lymph nodes: Cervical, supraclavicular, and axillary nodes normal. Resp: clear to auscultation bilaterally Back: symmetric, no curvature. ROM normal. No CVA tenderness. Cardio: regular rate and rhythm, S1, S2 normal, no murmur, click, rub or gallop GI: soft, non-tender; bowel sounds normal; no masses,  no organomegaly Extremities: edema 1+ bilaterally Neurologic: Alert and oriented X 3, normal strength and tone. Normal symmetric reflexes. Normal coordination and gait  ECOG PERFORMANCE STATUS: 1 - Symptomatic but  completely ambulatory  Blood pressure 149/80, pulse 91, temperature 98.4 F (36.9 C), temperature source Oral, resp. rate 18, height 5' 11"  (1.803 m), weight 159 lb 14.4 oz (72.53 kg), SpO2 93 %.  LABORATORY DATA: Lab Results  Component Value Date   WBC 8.8 06/21/2015   HGB 14.0 06/21/2015   HCT 40.4 06/21/2015   MCV 104.4* 06/21/2015   PLT 288 06/21/2015      Chemistry      Component Value Date/Time   NA 141 06/06/2015 1334   NA 144 05/24/2015 0940   K 3.1* 06/06/2015 1334   K 3.5 05/24/2015 0940   CL 106 06/06/2015 1334   CO2 27 06/06/2015 1334   CO2 27 05/24/2015 0940   BUN 16 06/06/2015 1334   BUN 12.7 05/24/2015 0940   CREATININE 0.90 06/06/2015 1334   CREATININE 0.9 05/24/2015 0940      Component Value Date/Time   CALCIUM 8.7* 06/06/2015 1334   CALCIUM 9.2 05/24/2015 0940   ALKPHOS 87 05/24/2015 0940   ALKPHOS 54 06/24/2014 0346   AST 24 05/24/2015 0940   AST 13 06/24/2014 0346   ALT 18 05/24/2015 0940   ALT 11 06/24/2014 0346   BILITOT 0.89 05/24/2015 0940   BILITOT 0.6 06/24/2014 0346       RADIOGRAPHIC STUDIES: No results found. ASSESSMENT AND PLAN: This is  a very pleasant 64 years old white male with metastatic adenocarcinoma of unknown primary questionable for upper gastrointestinal, pancreatic or biliary or primary lung cancer. He completed systemic chemotherapy with carboplatin and Alimta status post 6 cycles and tolerating his treatment fairly well with a stable disease. He is currently undergoing maintenance chemotherapy with single agent Alimta status post 8 cycles.  I recommended for him to continue on his maintenance treatment with single agent Alimta every 3 weeks as scheduled. He will receive cycle #9 today. He would come back for follow-up visit in 3 weeks for reevaluation before starting cycle #10 after repeating CT scan of the chest, abdomen and pelvis for restaging of his disease.Marland Kitchen He was advised to call immediately if he has any concerning  symptoms in the interval. The patient voices understanding of current disease status and treatment options and is in agreement with the current care plan.  All questions were answered. The patient knows to call the clinic with any problems, questions or concerns. We can certainly see the patient much sooner if necessary.  Disclaimer: This note was dictated with voice recognition software. Similar sounding words can inadvertently be transcribed and may not be corrected upon review.

## 2015-06-26 ENCOUNTER — Encounter: Payer: Self-pay | Admitting: Vascular Surgery

## 2015-06-26 ENCOUNTER — Ambulatory Visit: Payer: BLUE CROSS/BLUE SHIELD | Admitting: Physical Therapy

## 2015-06-27 ENCOUNTER — Ambulatory Visit (INDEPENDENT_AMBULATORY_CARE_PROVIDER_SITE_OTHER): Payer: BLUE CROSS/BLUE SHIELD | Admitting: Vascular Surgery

## 2015-06-27 ENCOUNTER — Ambulatory Visit: Payer: BLUE CROSS/BLUE SHIELD | Admitting: Physical Therapy

## 2015-06-27 ENCOUNTER — Encounter: Payer: Self-pay | Admitting: Vascular Surgery

## 2015-06-27 VITALS — BP 142/89 | HR 90 | Temp 98.6°F | Resp 18 | Ht 72.0 in | Wt 154.0 lb

## 2015-06-27 DIAGNOSIS — M7989 Other specified soft tissue disorders: Secondary | ICD-10-CM | POA: Diagnosis not present

## 2015-06-27 DIAGNOSIS — M79604 Pain in right leg: Secondary | ICD-10-CM

## 2015-06-27 DIAGNOSIS — M79661 Pain in right lower leg: Secondary | ICD-10-CM

## 2015-06-27 NOTE — Progress Notes (Signed)
Filed Vitals:   06/27/15 1517 06/27/15 1524  BP: 141/90 142/89  Pulse: 90 90  Temp: 98.6 F (37 C)   Resp: 18   Height: 6' (1.829 m)   Weight: 154 lb (69.854 kg)   SpO2: 96%

## 2015-06-27 NOTE — Progress Notes (Signed)
[facility] HISTORY AND PHYSICAL   History of Present Illness:  Patient is a 64 y.o. year old male who presents for evaluation of right leg swelling with redness. The patient states his symptoms started 2-3 months ago. States the right leg has been more swollen on the left. He states elevation helps sometimes. He denies prior history of DVT. He states he has had some decrease in size in the muscles of his right legs and 7 a back operation in the early 80s. He has been on 2 courses of anabiotic stomach the redness improved neither of which worked. He does not really describe specific pain in the leg. He denies claudication. He denies rest pain. He has had a duplex exam recently which showed a DVT. He also had a reflux exam in April 2016 which showed no evidence of reflux. He currently smokes 1 pack of cigarettes per day. He also has metastatic No carcinoma most likely lung primary. He is currently under treatment for this.  Other medical problems include hypertension, hemachromatosis, chronic pancreatitis, all of which are currently stable.  Past Medical History  Diagnosis Date  . Hypertension   . GERD (gastroesophageal reflux disease)   . Hemochromatosis   . Hx of adenomatous colonic polyps   . Umbilical hernia     none since weight loss  . Diverticulosis   . Lumbar spondylosis     ankle  . Pleural effusion on right 12/16/13    per chest xray  . Shortness of breath     with  exertion  . Chronic pancreatitis   . IBS (irritable bowel syndrome)   . Lung cancer 06/2014  . 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 Lumbar laminectomy  1983  . Knee arthrsoscopy  2003    left  . Tonsillectomy  1962  . Eus N/A 04/07/2013    Procedure: UPPER ENDOSCOPIC ULTRASOUND (EUS) LINEAR;  Surgeon: [name], MD;  Location[facility];  Service: Endoscopy;  Laterality: N/A;  . Video assisted thoracoscopy  (vats)/decortication Right 06/22/2014    Procedure: VIDEO ASSISTED THORACOSCOPY (VATS)/DECORTICATION;  Surgeon: [name], MD;  Location: [facility];  Service: Thoracic;  Laterality: Right;  . Pleural effusion drainage Right 06/22/2014    Procedure: DRAINAGE OF PLEURAL EFFUSION;  Surgeon: [name], MD;  Location: [facility];  Service: Thoracic;  Laterality: Right;  . Eus N/A 07/20/2014    Procedure: UPPER ENDOSCOPIC ULTRASOUND (EUS) LINEAR;  Surgeon: [name], MD;  Location[facility];  Service: Endoscopy;  Laterality: N/A;    Social History Social History  Substance Use Topics  . Smoking status: Current Every Day Smoker -- 2.00 packs/day for 40 years    Types: Cigarettes  . Smokeless tobacco: Never Used     Comment: 12/ to 3/4 packer per day now  . Alcohol Use: 12.6 oz/week    21 Cans of beer per week     Comment: Daily 2-3 beers    Family History Family History  Problem Relation Age of Onset  . Colon cancer Maternal Grandmother 65  . Stomach cancer Paternal Grandfather 26  . Hypertension Mother   . Varicose Veins Mother   . Hypertension Father   . Varicose Veins Father   . Hypertension Sister   . Heart disease Sister     Allergies  Allergies  Allergen Reactions  . Other Diarrhea    Red meats  Current Outpatient Prescriptions  Medication Sig Dispense Refill  . clindamycin (CLEOCIN) 150 MG capsule Take 1 capsule (150 mg total) by mouth every 6 (six) hours. (Patient taking differently: Take 150 mg by mouth every 6 (six) hours. Patient states that he is taking twice daily due to diarrhea) 28 capsule 0  . EPIPEN 2-PAK 0.3 MG/0.3ML SOAJ injection See admin instructions.  1  . folic acid (FOLVITE) 1 MG tablet Take 1 tablet (1 mg total) by mouth daily. 30 tablet 3  . furosemide (LASIX) 40 MG tablet Take 40 mg by mouth every morning.   11  . ibuprofen (ADVIL,MOTRIN) 200 MG tablet Take 400 mg by mouth every 6 (six) hours as needed (Pain).    . metoprolol  succinate (TOPROL-XL) 50 MG 24 hr tablet Take 50 mg by mouth daily after lunch.   12  . oxyCODONE (OXY IR/ROXICODONE) 5 MG immediate release tablet Take 1 tablet (5 mg total) by mouth every 4 (four) hours as needed for moderate pain. 30 tablet 0  . Pancrelipase, Lip-Prot-Amyl, 20880 UNITS TABS Take 2 tablets by mouth 3 (three) times daily with meals. 540 tablet 2  . potassium chloride SA (K-DUR,KLOR-CON) 20 MEQ tablet Take 20 mEq by mouth daily.    Marland Kitchen spironolactone (ALDACTONE) 25 MG tablet Take 25 mg by mouth daily after lunch.    Marland Kitchen UNABLE TO FIND Med Name: Oxygen concentrator.  Per medical necessity pt needs portable oxygen concentrator via nasal cannula with activity. 1 each 1  . verapamil (VERELAN PM) 240 MG 24 hr capsule Take 240 mg by mouth daily.  11   No current facility-administered medications for this visit.    ROS:   General:  No weight loss, Fever, chills  HEENT: No recent headaches, no nasal bleeding, no visual changes, no sore throat  Neurologic: No dizziness, blackouts, seizures. No recent symptoms of stroke or mini- stroke. No recent episodes of slurred speech, or temporary blindness.  Cardiac: No recent episodes of chest pain/pressure, no shortness of breath at rest.  No shortness of breath with exertion.  Denies history of atrial fibrillation + irregular heartbeat  Vascular: No history of rest pain in feet.  No history of claudication.  No history of non-healing ulcer, No history of DVT   Pulmonary: No home oxygen, no productive cough, no hemoptysis,  No asthma or wheezing  Musculoskeletal:  '[ ]'$  Arthritis, '[ ]'$  Low back pain,  '[ ]'$  Joint pain  Hematologic:No history of hypercoagulable state.  No history of easy bleeding.  No history of anemia  Gastrointestinal: No hematochezia or melena,  No gastroesophageal reflux, no trouble swallowing  Urinary: '[ ]'$  chronic Kidney disease, '[ ]'$  on HD - '[ ]'$  MWF or '[ ]'$  TTHS, '[ ]'$  Burning with urination, '[ ]'$  Frequent urination, '[ ]'$   Difficulty urinating;   Skin: No rashes  Psychological: No history of anxiety,  No history of depression   Physical Examination  Filed Vitals:   06/27/15 1517 06/27/15 1524  BP: 141/90 142/89  Pulse: 90 90  Temp: 98.6 F (37 C)   Resp: 18   Height: 6' (1.829 m)   Weight: 154 lb (69.854 kg)   SpO2: 96%     Body mass index is 20.88 kg/(m^2).  General:  Alert and oriented, no acute distress HEENT: Normal Neck: No bruit or JVD Pulmonary: Clear to auscultation bilaterally Cardiac: Regular Rate and Rhythm without murmur Abdomen: Soft, non-tender, non-distended, no mass Skin: No rash, diffuse circumferential erythema right pretibial region  of leg a few small scattered varicosities bilateral tibial region no ulcers Extremity Pulses:  2+ radial, brachial, femoral, dorsalis pedis pulses bilaterally, slightly widened full feeling popliteal pulses bilaterally Musculoskeletal: No deformity trace edema pretibial left leg, 2+ edema pretibial right leg  Neurologic: Upper and lower extremity motor 5/5 and symmetric  DATA:  I reviewed the patient's recent DVT scan as well as previous reflux scan no evidence of DVT no evidence of reflux. The reflux scan was done in April 2016.   ASSESSMENT:  Patient with erythema and swelling right lower extremity. This is chronic. His history and findings seem consistent with venous disorder. However previous scan 4 months ago did not confirm this. He also did have a widened popliteal pulses bilaterally.   PLAN:  Plan for limited arterial duplex scan to rule out popliteal aneurysm as a source of compression of the venous system. Also we will repeat his venous reflux exam tomorrow to make sure that this is not the primary culprit for his leg swelling symptoms. I have prescribed bilateral thigh-high compression stockings for the patient today. This will at least give him symptomatic relief. He will follow-up with me tomorrow after his noninvasive scan.  Ruta Hinds, MD [facility] Office: [phone] Pager: [phone]

## 2015-06-27 NOTE — Addendum Note (Signed)
Addended by: Dorthula Rue L on: 06/27/2015 05:23 PM   Modules accepted: Orders

## 2015-06-28 ENCOUNTER — Ambulatory Visit (HOSPITAL_COMMUNITY)
Admission: RE | Admit: 2015-06-28 | Discharge: 2015-06-28 | Disposition: A | Discharge status: Home / Self Care | Payer: BLUE CROSS/BLUE SHIELD | Source: Ambulatory Visit | Attending: Vascular Surgery | Admitting: Vascular Surgery

## 2015-06-28 ENCOUNTER — Ambulatory Visit (INDEPENDENT_AMBULATORY_CARE_PROVIDER_SITE_OTHER): Payer: BLUE CROSS/BLUE SHIELD | Admitting: Vascular Surgery

## 2015-06-28 ENCOUNTER — Encounter: Payer: Self-pay | Admitting: Vascular Surgery

## 2015-06-28 ENCOUNTER — Ambulatory Visit: Payer: BLUE CROSS/BLUE SHIELD

## 2015-06-28 ENCOUNTER — Ambulatory Visit (INDEPENDENT_AMBULATORY_CARE_PROVIDER_SITE_OTHER)
Admission: RE | Admit: 2015-06-28 | Discharge: 2015-06-28 | Disposition: A | Discharge status: Home / Self Care | Payer: BLUE CROSS/BLUE SHIELD | Source: Ambulatory Visit | Attending: Vascular Surgery | Admitting: Vascular Surgery

## 2015-06-28 VITALS — BP 156/101 | HR 90 | Temp 98.1°F | Resp 16 | Ht 72.0 in | Wt 157.0 lb

## 2015-06-28 DIAGNOSIS — M79604 Pain in right leg: Secondary | ICD-10-CM

## 2015-06-28 DIAGNOSIS — R5381 Other malaise: Secondary | ICD-10-CM | POA: Diagnosis not present

## 2015-06-28 DIAGNOSIS — I83811 Varicose veins of right lower extremities with pain: Secondary | ICD-10-CM

## 2015-06-28 DIAGNOSIS — M79661 Pain in right lower leg: Secondary | ICD-10-CM

## 2015-06-28 DIAGNOSIS — M7989 Other specified soft tissue disorders: Secondary | ICD-10-CM

## 2015-06-28 DIAGNOSIS — R0602 Shortness of breath: Secondary | ICD-10-CM

## 2015-06-28 DIAGNOSIS — I8393 Asymptomatic varicose veins of bilateral lower extremities: Secondary | ICD-10-CM | POA: Insufficient documentation

## 2015-06-28 DIAGNOSIS — R29898 Other symptoms and signs involving the musculoskeletal system: Secondary | ICD-10-CM

## 2015-06-28 NOTE — Therapy (Signed)
[place], [place], [address] Phone: [phone]   Fax:  [phone]  Physical Therapy Treatment  Patient Details  Name: Henry [name] MRN: [id number] Date of Birth: 1950-12-02 Referring Provider:  Curt Bears, MD  Encounter Date: 06/28/2015      PT End of Session - 06/28/15 1044    Visit Number 4   Number of Visits 16   Date for PT Re-Evaluation 08/17/15   PT Start Time 0934   PT Stop Time 1032   PT Time Calculation (min) 58 min   Activity Tolerance Patient tolerated treatment well   Behavior During Therapy [facility] for tasks assessed/performed      Past Medical History  Diagnosis Date  . Hypertension   . GERD (gastroesophageal reflux disease)   . Hemochromatosis   . Hx of adenomatous colonic polyps   . Umbilical hernia     none since weight loss  . Diverticulosis   . Lumbar spondylosis     ankle  . Pleural effusion on right 12/16/13    per chest xray  . Shortness of breath     with  exertion  . Chronic pancreatitis   . IBS (irritable bowel syndrome)   . Lung cancer 06/2014  . 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 Lumbar laminectomy  1983  . Knee arthrsoscopy  2003    left  . Tonsillectomy  1962  . Eus N/A 04/07/2013    Procedure: UPPER ENDOSCOPIC ULTRASOUND (EUS) LINEAR;  Surgeon: [name], MD;  Location: [facility];  Service: Endoscopy;  Laterality: N/A;  . Video assisted thoracoscopy (vats)/decortication Right 06/22/2014    Procedure: VIDEO ASSISTED THORACOSCOPY (VATS)/DECORTICATION;  Surgeon: [name], MD;  Location: [facility];  Service: Thoracic;  Laterality: Right;  . Pleural effusion drainage Right 06/22/2014    Procedure: DRAINAGE OF PLEURAL EFFUSION;  Surgeon: [name], MD;  Location: [place];  Service: Thoracic;  Laterality: Right;  . Eus N/A 07/20/2014    Procedure: UPPER ENDOSCOPIC ULTRASOUND  (EUS) LINEAR;  Surgeon: [name], MD;  [facility];  Service: Endoscopy;  Laterality: N/A;    There were no vitals filed for this visit.  Visit Diagnosis:  Physical deconditioning  Shortness of breath on exertion  Weakness of both legs      Subjective Assessment - 06/28/15 0940    Subjective My breathing feels off today and my Rt calf is really bothering me, have another appt with my vascular doctor today for new scans, turns out I didn't have cellulitis.    Currently in Pain? Yes   Pain Score 4    Pain Location Calf   Pain Orientation Right   Pain Descriptors / Indicators Pins and needles   Pain Onset More than a month ago   Aggravating Factors  prolonged standing   Pain Relieving Factors laying down, sitting, heat, elevation                         [facility] Adult PT Treatment/Exercise - 06/28/15 0001    Knee/Hip Exercises: Aerobic   Nustep Level 4, 10 minutes  SpO2 96% after, PTA present to monitor pt throughout   Knee/Hip Exercises: Standing   Forward Step Up 2 sets;10 reps;Hand Hold: 1;Step Height: 6";Right;Left  SpO2 93% after   Step Down Both;10 reps;Hand Hold: 1;Step Height: 4"   Wall Squat 10 reps;5 seconds  SpO2 97%   Walking with Sports Cord Resisted walking 2 plates forward and backward 3 reps each with CGA throughout  SpO2 90% after, seated rest for 3 mins to get to 94%   Knee/Hip Exercises: Supine   Bridges Strengthening;2 sets  3 reps each set   Bridges Limitations SpO2 dropped to 86% when pt laid supine, up to 92% after 2 minutes. Stayed 93-96% during bridges                PT Education - 06/28/15 1043    Education provided Yes   Education Details Supine LE strength for bed mobility   Person(s) Educated Patient   Methods Explanation;Demonstration;Handout;Verbal cues  We only got to do bridges today   Comprehension Verbalized understanding;Returned demonstration;Need further instruction           Short Term  Clinic Goals - 06/28/15 1048    CC Short Term Goal  #1   Title independent with home exercise program for strengthening and endurance, performed safely  Progressed HEP again today   Status On-going   CC Short Term Goal  #2   Title will report perception of decreased fatigue since starting exercise  This actually minimally worse today, pt unsure why. 06/28/15   Status On-going             [facility] Goals - 06/20/15 1202    CC Long Term Goal  #1   Status On-going   CC Long Term Goal  #2   Status On-going   CC Long Term Goal  #3   Status On-going   CC Long Term Goal  #4   Status On-going            Plan - 06/28/15 1044    Clinical Impression Statement Pt tolerated exercises well today though his SpO2 was a little below average today and would drop as low as 86% briefly before coming righ tback up to 18% with certain activities, laying supine at end of treatment is what caused his lowest drop to 86% and he reports this happens frequently at home. He is to mention this to the vascular doctor he sees today. Added supine HEP as he reports this is most taxing on him.    Pt will benefit from skilled therapeutic intervention in order to improve on the following deficits Cardiopulmonary status limiting activity;Decreased endurance;Decreased strength   Rehab Potential Good   PT Frequency 2x / week   PT Duration 8 weeks   PT Treatment/Interventions Patient/family education;ADLs/Self Care Home Management;Therapeutic exercise   PT Next Visit Plan Cont step-ups and step-downs, resisted walking, sit to stand.  Continue to progress endurance activity monitoring SpO2 throughout.    PT Home Exercise Plan Supine exercises added today    Consulted and Agree with Plan of Care Patient        Problem List Patient Active Problem List   Diagnosis Date Noted  . Cellulitis 06/07/2015  . Encounter for antineoplastic chemotherapy 05/05/2015  . Hypokalemia 08/31/2014  . Hyperbilirubinemia  08/31/2014  . Candida infection, esophageal 08/30/2014  . Thyroid nodule 07/18/2014  . Protein-calorie malnutrition, severe 06/26/2014  . Lung cancer 06/22/2014  . Pleural effusion on right 12/16/2013  . Nonspecific (abnormal) findings on radiological and other examination of gastrointestinal tract 04/07/2013  . Unspecified gastritis and gastroduodenitis without mention of hemorrhage 04/07/2013  . HTN (hypertension) 01/31/2013  . Hx of adenomatous colonic polyps 01/31/2013  . Hemochromatosis 01/31/2013    Otelia Limes, PTA 06/28/2015, 10:50  [place], [place], [address] Phone: [phone]   Fax:  [phone]

## 2015-06-28 NOTE — Progress Notes (Addendum)
Patient is a 64 year old male who returns today for follow-up after venous reflux exam today. 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  Filed Vitals:   06/28/15 1353 06/28/15 1358  BP: 159/98 156/101  Pulse: 88 90  Temp: 98.1 F (36.7 C)   TempSrc: Oral   Resp: 16   Height: 6' (1.829 m)   Weight: 157 lb (71.215 kg)   SpO2: 98%      Data: Patient had a venous reflux exam today which showed no significant reflux in the left leg however the right leg did have significant reflux diffusely throughout the lesser saphenous vein in the right leg vein diameter was 4-6 mm.  patient also had a popliteal ultrasound which showed the popliteal artery was 1 cm in diameter bilaterally no aneurysm  Assessment: Venous reflux primarily lesser saphenous vein right leg symptomatic with pain and episodes of cellulitis, no evidence of popliteal aneurysm  Plan: Patient was given a prescription yesterday for compression stockings. He will wear these as much as possible. He will elevate the legs when possible. He'll return in 3 months time for consideration of laser ablation of his lesser saphenous. At this point I do not believe he has active ongoing cellulitis but more skin inflammation from his varicosities. If he has any additional more active flareup he could be treated again without Plavix and certainly should follow-up with [place] again. I do not believe the current state of his leg would be any contraindication to any other procedures that he might require.  Ruta Hinds, MD [facility] Office: [phone] Pager: [phone]

## 2015-06-28 NOTE — Progress Notes (Signed)
Filed Vitals:   06/28/15 1353 06/28/15 1358  BP: 159/98 156/101  Pulse: 88 90  Temp: 98.1 F (36.7 C)   TempSrc: Oral   Resp: 16   Height: 6' (1.829 m)   Weight: 157 lb (71.215 kg)   SpO2: 98%

## 2015-06-28 NOTE — Patient Instructions (Addendum)
Bridging   Slowly raise buttocks from floor, keeping stomach tight. Repeat __5-8__ times per set. Do _2___ sets per session. Do _2___ sessions per day.  http://orth.exer.us/1096   Copyright  VHI. All rights reserved.    Hip Flexion / Knee Extension: Straight-Leg Raise (Eccentric)   Lie on back. Lift leg with knee straight. Slowly lower leg for 3-5 seconds. _5-10__ reps per set, _1-2__ sets per day.   ABDUCTION: Side-Lying (Active)   Lie on left side, top leg straight. Raise top leg as far as possible.  Complete 1-2__ sets of _5-10__ repetitions. Perform _1-2__ sessions per day.  http://gtsc.exer.us/94   (Home) Extension: Hip   With support under abdomen, tighten stomach. Lift right leg in line with body. Do not hyperextend. Alternate legs. Repeat _5-8___ times per set. Do _1-2___ sets per session.

## 2015-07-03 ENCOUNTER — Ambulatory Visit (INDEPENDENT_AMBULATORY_CARE_PROVIDER_SITE_OTHER): Payer: BLUE CROSS/BLUE SHIELD | Admitting: Internal Medicine

## 2015-07-03 ENCOUNTER — Encounter: Payer: Self-pay | Admitting: Gastroenterology

## 2015-07-03 ENCOUNTER — Encounter: Payer: Self-pay | Admitting: Internal Medicine

## 2015-07-03 VITALS — BP 138/86 | HR 87 | Ht 72.0 in | Wt 157.2 lb

## 2015-07-03 DIAGNOSIS — F17210 Nicotine dependence, cigarettes, uncomplicated: Secondary | ICD-10-CM

## 2015-07-03 DIAGNOSIS — Z720 Tobacco use: Secondary | ICD-10-CM | POA: Diagnosis not present

## 2015-07-03 DIAGNOSIS — J449 Chronic obstructive pulmonary disease, unspecified: Secondary | ICD-10-CM | POA: Diagnosis not present

## 2015-07-03 DIAGNOSIS — R0902 Hypoxemia: Secondary | ICD-10-CM

## 2015-07-03 MED ORDER — MOMETASONE FURO-FORMOTEROL FUM 200-5 MCG/ACT IN AERO: INHALATION_SPRAY | RESPIRATORY_TRACT | Status: DC

## 2015-07-03 NOTE — Patient Instructions (Signed)
Try dulera 200 Take 2 puffs first thing in am and then another 2 puffs about 12 hours later.   Only use your albuterol as a rescue medication to be used if you can't catch your breath by resting or doing a relaxed purse lip breathing pattern.  - The less you use it, the better it will work when you need it. - Ok to use up to 2 puffs  every 4 hours if you must but call for immediate appointment if use goes up over your usual need - Don't leave home without it !!  (think of it like the spare tire for your car)   The key is to stop smoking completely before smoking completely stops you!   Please schedule a follow up office visit in 4 weeks, sooner if needed with pfts on return

## 2015-07-03 NOTE — Progress Notes (Signed)
Subjective:    Patient ID: Henry [name], male    DOB: 1951/03/06,    MRN: [id number]  HPI  28 yowm active smoker dx lung ca summer 2015 some better p talc pleurodesis but downhill since Winter 2016 but varaibly sob so referred to pulmonary 07/03/2015   by Dr Alroy Dust  Last oncology update 8///5/16 DIAGNOSIS: Metastatic adenocarcinoma of unknown primary but questionable for upper gastrointestinal, pancreaticobiliary or primary lung cancer, diagnosed in August of 2015.  Genomic Alterations Identified? FGFR2 A97T KRAS Q61H - subclonal? ARID1A [id number]* - subclonal?  PRIOR THERAPY:  1) Status post right video-assisted thoracoscopy, drainage of pleural effusion, pleural biopsy, partial decortication, talc pleurodesis.under the care of Dr. Roxan Hockey on 06/23/2014. 2) Systemic chemotherapy with carboplatin for AUC of 5 and Alimta 500 mg/M2 every 3 weeks. He is status post 6 cycles, with stable disease after cycle #6.  CURRENT THERAPY: Maintenance chemotherapy with single agent Alimta 500 MG/M2 every 3 weeks. First dose 12/20/2014. He is status post 8 cycles.   07/03/2015 1st [facility] office visit/ [facility]   Chief Complaint  Patient presents with  . Pulmonary Consult    Referred by Dr Marcello Moores. Pt c/o increased SOB with exertion, productive cough with thick mucus sometimes blood tinged, wheezing, and chest pain x 1 month   can still walk HT aisles on slowpace and no 02  / struggles to get up from garage to kitchen door but can do s stopping = MMRC 2  Albuterol helps some  Can't lie on L side / better flat on back with 2 pillows under head/ chronic  swelling in both legs  Just finish clindamycin didn't help cough much still variably yellow/ bloody mucus esp each am Cp is diffuse/ ant made worse with coughing x one month   No obvious other patterns in day to day or daytime variabilty or assoc chest tightness, subjective wheeze overt sinus or hb symptoms. No unusual exp hx or h/o  childhood pna/ asthma or knowledge of premature birth.  Sleeping ok  As above if stays off L side down without nocturnal  or early am exacerbation  of respiratory  c/o's or need for noct saba. Also denies any obvious fluctuation of symptoms with weather or environmental changes or other aggravating or alleviating factors except as outlined above   Current Medications, Allergies, Complete Past Medical History, Past Surgical History, Family History, and Social History were reviewed in [employer] record.             Review of Systems  Constitutional: Negative for fever, chills, activity change, appetite change and unexpected weight change.  HENT: Negative for congestion, dental problem, postnasal drip, rhinorrhea, sneezing, sore throat, trouble swallowing and voice change.   Eyes: Negative for visual disturbance.  Respiratory: Positive for cough, chest tightness, shortness of breath and wheezing. Negative for choking.   Cardiovascular: Positive for chest pain. Negative for leg swelling.  Gastrointestinal: Negative for nausea, vomiting and abdominal pain.  Genitourinary: Negative for difficulty urinating.  Musculoskeletal: Negative for arthralgias.  Skin: Negative for rash.  Psychiatric/Behavioral: Negative for behavioral problems and confusion.       Objective:   Physical Exam   amb wm nad with unusual affect    Wt Readings from Last 3 Encounters:  07/03/15 157 lb 3.2 oz (71.305 kg)  06/28/15 157 lb (71.215 kg)  06/27/15 154 lb (69.854 kg)    Vital signs reviewed   HEENT: nl dentition, turbinates, and orophanx. Nl external ear  canals without cough reflex   NECK :  without JVD/Nodes/TM/ nl carotid upstrokes bilaterally   LUNGS: no acc muscle use,  / decreased both bases/ exp rhonchi bilaterally   CV:  RRR  no s3 or murmur or increase in P2,  1+ edema L > R   ABD:  soft and nontender with nl excursion in the supine position. No bruits or  organomegaly, bowel sounds nl  MS:  warm without deformities, calf tenderness, cyanosis or clubbing  SKIN: warm and dry without lesions    NEURO:  alert, approp, no deficits     I personally reviewed images and agree with radiology impression as follows:  CTa 05/01/15 Prior cavitary left upper lobe nodule nodule has essentially resolved with mild residual scarring. Status post talc pleurodesis in the right hemithorax with moderate complex/loculated right pleural effusion. Associated patchy opacity/consolidation in the right middle and lower lobes, chronic. Borderline enlarged thoracic lymph nodes, grossly unchanged. No evidence of metastatic disease in the abdomen/ pelvis.       Assessment & Plan:

## 2015-07-04 ENCOUNTER — Encounter: Payer: Self-pay | Admitting: Internal Medicine

## 2015-07-04 DIAGNOSIS — F17210 Nicotine dependence, cigarettes, uncomplicated: Secondary | ICD-10-CM | POA: Insufficient documentation

## 2015-07-04 DIAGNOSIS — J9612 Chronic respiratory failure with hypercapnia: Secondary | ICD-10-CM | POA: Insufficient documentation

## 2015-07-04 NOTE — Assessment & Plan Note (Signed)
07/03/2015  Walked RA x 3 laps @ 185 ft each stopped due to  Sob and desat to 86% at moderate  pace   This was more walking that he normally does so doubt amb 02 will help, will rx copd and have him return for pfts / re-walk

## 2015-07-04 NOTE — Assessment & Plan Note (Signed)
Symptoms are markedly disproportionate to objective findings and not clear this is all a lung problem but pt does appear to have difficult airway management issues.  DDX of  difficult airways management all start with A and  include Adherence, Ace Inhibitors, Acid Reflux, Active Sinus Disease, Alpha 1 Antitripsin deficiency, Anxiety masquerading as Airways dz,  ABPA,  allergy(esp in young), Aspiration (esp in elderly), Adverse effects of meds,  Active smokers, A bunch of PE's (a small clot burden can't cause this syndrome unless there is already severe underlying pulm or vascular dz with poor reserve) plus two Bs  = Bronchiectasis and Beta blocker use..and one C= CHF   Adherence is always the initial "prime suspect" and is a multilayered concern that requires a "trust but verify" approach in every patient - starting with knowing how to use medications, especially inhalers, correctly, keeping up with refills and understanding the fundamental difference between maintenance and prns vs those medications only taken for a very short course and then stopped and not refilled.  -The proper method of use, as well as anticipated side effects, of a metered-dose inhaler are discussed and demonstrated to the patient. Improved effectiveness after extensive coaching during this visit to a level of approximately  50% - pt wanted to argue with me about proper technique, a very bad sign for future reference in terms of adherence, will need a trust but verify approach here  Active smoking > see sep a/p  A bunch of PE's > unlikely at present though he is at risk  I had an extended discussion with the patient reviewing all relevant studies completed to date and  lasting 68 m  For now just try duler 200 2bid sample and if not happy go back to just using prn saba pending pfs   Each maintenance medication was reviewed in detail including most importantly the difference between maintenance and prns and under what circumstances  the prns are to be triggered using an action plan format that is not reflected in the computer generated alphabetically organized AVS.    Please see instructions for details which were reviewed in writing and the patient given a copy highlighting the part that I personally wrote and discussed at today's ov.

## 2015-07-04 NOTE — Assessment & Plan Note (Signed)
>   3 min discussion  I emphasized that although we never turn away smokers from the pulmonary clinic, we do ask that they understand that the recommendations that we make  won't work nearly as well in the presence of continued cigarette exposure.  In fact, we may very well  reach a point where we can't promise to help the patient if he/she can't quit smoking. (We can and will promise to try to help, we just can't promise what we recommend will really work)  When I suggested using e cigs as a "one way bridge" off cigarettes he wanted to argue with me about the risks off e cigs/ again showing tit is unlikely he is going to comply with my advice

## 2015-07-10 ENCOUNTER — Telehealth: Payer: Self-pay | Admitting: Internal Medicine

## 2015-07-10 ENCOUNTER — Ambulatory Visit (HOSPITAL_COMMUNITY)
Admission: RE | Admit: 2015-07-10 | Discharge: 2015-07-10 | Disposition: A | Discharge status: Home / Self Care | Payer: BLUE CROSS/BLUE SHIELD | Source: Ambulatory Visit | Attending: Internal Medicine | Admitting: Internal Medicine

## 2015-07-10 ENCOUNTER — Encounter (HOSPITAL_COMMUNITY): Payer: Self-pay

## 2015-07-10 ENCOUNTER — Ambulatory Visit: Payer: BLUE CROSS/BLUE SHIELD

## 2015-07-10 DIAGNOSIS — C7951 Secondary malignant neoplasm of bone: Secondary | ICD-10-CM | POA: Insufficient documentation

## 2015-07-10 DIAGNOSIS — E278 Other specified disorders of adrenal gland: Secondary | ICD-10-CM | POA: Insufficient documentation

## 2015-07-10 DIAGNOSIS — R5381 Other malaise: Secondary | ICD-10-CM

## 2015-07-10 DIAGNOSIS — R1909 Other intra-abdominal and pelvic swelling, mass and lump: Secondary | ICD-10-CM

## 2015-07-10 DIAGNOSIS — E876 Hypokalemia: Secondary | ICD-10-CM

## 2015-07-10 DIAGNOSIS — Z08 Encounter for follow-up examination after completed treatment for malignant neoplasm: Secondary | ICD-10-CM

## 2015-07-10 DIAGNOSIS — R0602 Shortness of breath: Secondary | ICD-10-CM | POA: Diagnosis present

## 2015-07-10 DIAGNOSIS — R29898 Other symptoms and signs involving the musculoskeletal system: Secondary | ICD-10-CM

## 2015-07-10 DIAGNOSIS — J90 Pleural effusion, not elsewhere classified: Secondary | ICD-10-CM | POA: Insufficient documentation

## 2015-07-10 DIAGNOSIS — C3490 Malignant neoplasm of unspecified part of unspecified bronchus or lung: Secondary | ICD-10-CM | POA: Insufficient documentation

## 2015-07-10 DIAGNOSIS — R188 Other ascites: Secondary | ICD-10-CM

## 2015-07-10 MED ORDER — IOHEXOL 300 MG/ML  SOLN
100.0000 mL | Freq: Once | INTRAMUSCULAR | Status: AC | PRN
  Administered 2015-07-10: 100 mL via INTRAVENOUS

## 2015-07-10 NOTE — Telephone Encounter (Signed)
The problem is the new L effusion needs to be tapped dry by IR to sort out what it is and this will relieve his sob at least in the short run.  Send for glucose, protein, ldh, cytology and cell count and diff

## 2015-07-10 NOTE — Telephone Encounter (Signed)
Pt states that breathing is worsening since last OV - does not feel the treatment is working  Pt states that it was discussed during OV of possible nebulizer treatments and Prednisone.  Pt does not feel the he can wait until Sept appt to follow up.  Pt states that today with PT present in home he was sitting in chair and was 92% at rest and when he laid down his O2 dropped to 82% Pt notes increased SOB with laying down at night.  Pt has CT done today and would like to let MW know so that results can be reviewed.   Patient Instructions     Try dulera 200 Take 2 puffs first thing in am and then another 2 puffs about 12 hours later.   Only use your albuterol as a rescue medication to be used if you can't catch your breath by resting or doing a relaxed purse lip breathing pattern.  - The less you use it, the better it will work when you need it. - Ok to use up to 2 puffs every 4 hours if you must but call for immediate appointment if use goes up over your usual need - Don't leave home without it !! (think of it like the spare tire for your car)   The key is to stop smoking completely before smoking completely stops you!   Please schedule a follow up office visit in 4 weeks, sooner if needed with pfts on return

## 2015-07-10 NOTE — Telephone Encounter (Signed)
Spoke with the pt and notified of recs per MW  Pt verbalized understanding  Order sent to [facility]   

## 2015-07-10 NOTE — Therapy (Addendum)
[place], [place], [address] Phone: [phone]   Fax:  [phone]  Physical Therapy Treatment  Patient Details  Name: Henry [name] MRN: [id number] Date of Birth: 02/17/1951 Referring Provider:  Curt Bears, MD  Encounter Date: 07/10/2015      PT End of Session - 07/10/15 1014    Visit Number 5   Number of Visits 16   Date for PT Re-Evaluation 08/17/15   PT Start Time 0936   PT Stop Time 1015   PT Time Calculation (min) 39 min   Activity Tolerance Treatment limited secondary to medical complications (Comment)   Behavior During Therapy [facility] for tasks assessed/performed      Past Medical History  Diagnosis Date  . Hypertension   . GERD (gastroesophageal reflux disease)   . Hemochromatosis   . Hx of adenomatous colonic polyps   . Umbilical hernia     none since weight loss  . Diverticulosis   . Lumbar spondylosis     ankle  . Pleural effusion on right 12/16/13    per chest xray  . Shortness of breath     with  exertion  . Chronic pancreatitis   . IBS (irritable bowel syndrome)   . Lung cancer 06/2014  . 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 Lumbar laminectomy  1983  . Knee arthrsoscopy  2003    left  . Tonsillectomy  1962  . Eus N/A 04/07/2013    Procedure: UPPER ENDOSCOPIC ULTRASOUND (EUS) LINEAR;  Surgeon: [name], MD;  Location: [facility];  Service: Endoscopy;  Laterality: N/A;  . Video assisted thoracoscopy (vats)/decortication Right 06/22/2014    Procedure: VIDEO ASSISTED THORACOSCOPY (VATS)/DECORTICATION;  Surgeon: [name], MD;  Location: [facility];  Service: Thoracic;  Laterality: Right;  . Pleural effusion drainage Right 06/22/2014    Procedure: DRAINAGE OF PLEURAL EFFUSION;  Surgeon: [name], MD;  Location: [facility];  Service: Thoracic;  Laterality: Right;  . Eus N/A 07/20/2014    Procedure:  UPPER ENDOSCOPIC ULTRASOUND (EUS) LINEAR;  Surgeon: [name], MD;  Location: [facility];  Service: Endoscopy;  Laterality: N/A;    There were no vitals filed for this visit.  Visit Diagnosis:  Physical deconditioning  Shortness of breath on exertion  Weakness of both legs      Subjective Assessment - 07/10/15 0945    Subjective They gave me an inhaler 3 days ago Ruthe Mannan) but I've stopped it bc my symptoms got worse (i.e. increase mucus and trouble breathing). My SpO2 baseline has dropped from being 94-95% to about 90%.  I got thigh high compression stockings from vascular doctor last Monday and those have helped my leg pain tremendously and my swelling is gone. Have a CT scan today that the pulmonary doctor wanted me to have and when he calls me with those results later I'll tell him I stopped the new inhaler bc it was making me worse.   Currently in Pain? No/denies                         [facility] Adult PT Treatment/Exercise - 07/10/15 0001    Knee/Hip Exercises: Seated   Long Arc Quad 1 set;10 reps   Long Arc Quad Weight 2 lbs.   Long Arc Quad Limitations SpO2 93% before, then 88% after and pt SOB.   Other Seated Knee/Hip Exercises Marching  Marching Limitations SpO2 94% before, then dropped to 89-90% and pt reported feeling SOB.   Marching Weights 2 lbs.   Knee/Hip Exercises: Supine   Other Supine Knee/Hip Exercises Was going to instruct pt in Meeks Decompression exercises for supine laying but his SpO2 dropped to 82% and after laying there for about 2 mins with cuing for deep breathing (which pt has trouble doing effectively) it was only up to 84% so had pt sit back up and after about 2 more mins SpO2 was up to 92%.                PT Education - 07/10/15 1012    Education provided Yes   Education Details Meeks Exercises   Person(s) Educated Patient   Methods Explanation;Demonstration;Handout   Comprehension Verbalized understanding  Unable to  have pt do today due to SpO2 dropping to 82% with sit-supine           Short Term Clinic Goals - 06/28/15 1048    CC Short Term Goal  #1   Title independent with home exercise program for strengthening and endurance, performed safely  Progressed HEP again today   Status On-going   CC Short Term Goal  #2   Title will report perception of decreased fatigue since starting exercise  This actually minimally worse today, pt unsure why. 06/28/15   Status On-going             [facility] Goals - 06/20/15 1202    CC Long Term Goal  #1   Status On-going   CC Long Term Goal  #2   Status On-going   CC Long Term Goal  #3   Status On-going   CC Long Term Goal  #4   Status On-going            Plan - 07/10/15 1158    Clinical Impression Statement Pt was not doing well today and reported was having a rough week. Almost went to the ED Friday due to couldn't catch his breath but ended up waiting to go to doctor who prescribed him Delura inhaler which he did for about 3 days and now feels worse and his baseline SpO2 has dropped from being around 94-95% down to 91-92% at rest. We were unable to do much of anything today due to his inconsistent SpO2 levels and that they kept dropping below 90% though we were intermittently trying while assessing throughout. Pt is having a CT scan today and the pulmonologist will get those results tomorrow and pt will speak to him then about other options. Instructed pt to please bring his oxygen to therapy from here on out so his SpO2 will be better managed and he agreed.    Pt will benefit from skilled therapeutic intervention in order to improve on the following deficits Cardiopulmonary status limiting activity;Decreased endurance;Decreased strength   Rehab Potential Good   PT Frequency 2x / week   PT Duration 8 weeks   PT Treatment/Interventions Patient/family education;ADLs/Self Care Home Management;Therapeutic exercise   PT Next Visit Plan See if pt  got any answers regarding recent drop in SpO2 from pulmonologist and scan results. Cont step-ups and step-downs, resisted walking, sit to stand.  Continue to progress endurance activity monitoring SpO2 throughout.    Recommended Other Services Bring his oxygen to therapy   Consulted and Agree with Plan of Care Patient        Problem List Patient Active Problem List   Diagnosis Date Noted  .  Exercise hypoxemia 07/04/2015  . Cigarette smoker 07/04/2015  . COPD pfts pending  07/03/2015  . Cellulitis 06/07/2015  . Encounter for antineoplastic chemotherapy 05/05/2015  . Hypokalemia 08/31/2014  . Hyperbilirubinemia 08/31/2014  . Candida infection, esophageal 08/30/2014  . Thyroid nodule 07/18/2014  . Protein-calorie malnutrition, severe 06/26/2014  . Lung cancer 06/22/2014  . Pleural effusion on right 12/16/2013  . Nonspecific (abnormal) findings on radiological and other examination of gastrointestinal tract 04/07/2013  . Unspecified gastritis and gastroduodenitis without mention of hemorrhage 04/07/2013  . HTN (hypertension) 01/31/2013  . Hx of adenomatous colonic polyps 01/31/2013  . Hemochromatosis 01/31/2013    Otelia Limes, PTA 07/10/2015, 12:08 PM  [place], [place], [address] Phone: [phone]   Fax:  [phone]     PHYSICAL THERAPY DISCHARGE SUMMARY  Visits from Start of Care: 5  Current functional level related to goals / functional outcomes: Goals were not met.   Remaining deficits: Not applicable.   Education / Equipment: HEP Plan: Patient agrees to discharge.  Patient goals were not met. Patient is being discharged due to a change in medical status.  His pulmonary function was in decline on his last visit here and he subsequently passed away.???   Serafina Royals, PT 11/27/2015 5:34 PM

## 2015-07-10 NOTE — Patient Instructions (Signed)
Laying down: 1. Head press 2. Shoulder press 3. Leg Lengthener 4. Leg press  All 5 reps for 5 seconds, of course watching oxygen throughout.

## 2015-07-11 ENCOUNTER — Telehealth: Payer: Self-pay | Admitting: Internal Medicine

## 2015-07-11 ENCOUNTER — Inpatient Hospital Stay (HOSPITAL_COMMUNITY)
Admission: EM | Admit: 2015-07-11 | Discharge: 2015-07-15 | DRG: 939 | Disposition: A | Discharge status: Home / Self Care | Payer: BLUE CROSS/BLUE SHIELD | Attending: Internal Medicine | Admitting: Internal Medicine

## 2015-07-11 ENCOUNTER — Telehealth: Payer: Self-pay | Admitting: Vascular Surgery

## 2015-07-11 ENCOUNTER — Other Ambulatory Visit: Payer: Self-pay

## 2015-07-11 ENCOUNTER — Other Ambulatory Visit (HOSPITAL_COMMUNITY): Payer: Self-pay

## 2015-07-11 ENCOUNTER — Encounter (HOSPITAL_COMMUNITY): Payer: Self-pay | Admitting: *Deleted

## 2015-07-11 ENCOUNTER — Emergency Department (HOSPITAL_COMMUNITY): Payer: BLUE CROSS/BLUE SHIELD

## 2015-07-11 DIAGNOSIS — I5033 Acute on chronic diastolic (congestive) heart failure: Secondary | ICD-10-CM | POA: Diagnosis present

## 2015-07-11 DIAGNOSIS — R188 Other ascites: Secondary | ICD-10-CM | POA: Diagnosis present

## 2015-07-11 DIAGNOSIS — K589 Irritable bowel syndrome without diarrhea: Secondary | ICD-10-CM | POA: Diagnosis present

## 2015-07-11 DIAGNOSIS — Z85850 Personal history of malignant neoplasm of thyroid: Secondary | ICD-10-CM | POA: Diagnosis not present

## 2015-07-11 DIAGNOSIS — K219 Gastro-esophageal reflux disease without esophagitis: Secondary | ICD-10-CM | POA: Diagnosis present

## 2015-07-11 DIAGNOSIS — R0600 Dyspnea, unspecified: Secondary | ICD-10-CM | POA: Diagnosis not present

## 2015-07-11 DIAGNOSIS — C3490 Malignant neoplasm of unspecified part of unspecified bronchus or lung: Secondary | ICD-10-CM | POA: Diagnosis not present

## 2015-07-11 DIAGNOSIS — F17210 Nicotine dependence, cigarettes, uncomplicated: Secondary | ICD-10-CM | POA: Diagnosis present

## 2015-07-11 DIAGNOSIS — J90 Pleural effusion, not elsewhere classified: Secondary | ICD-10-CM | POA: Diagnosis present

## 2015-07-11 DIAGNOSIS — C7951 Secondary malignant neoplasm of bone: Secondary | ICD-10-CM | POA: Diagnosis present

## 2015-07-11 DIAGNOSIS — C7800 Secondary malignant neoplasm of unspecified lung: Secondary | ICD-10-CM | POA: Diagnosis present

## 2015-07-11 DIAGNOSIS — J9610 Chronic respiratory failure, unspecified whether with hypoxia or hypercapnia: Secondary | ICD-10-CM | POA: Diagnosis present

## 2015-07-11 DIAGNOSIS — J449 Chronic obstructive pulmonary disease, unspecified: Secondary | ICD-10-CM | POA: Diagnosis present

## 2015-07-11 DIAGNOSIS — C801 Malignant (primary) neoplasm, unspecified: Secondary | ICD-10-CM | POA: Diagnosis present

## 2015-07-11 DIAGNOSIS — E876 Hypokalemia: Secondary | ICD-10-CM | POA: Diagnosis present

## 2015-07-11 DIAGNOSIS — Z7951 Long term (current) use of inhaled steroids: Secondary | ICD-10-CM

## 2015-07-11 DIAGNOSIS — I10 Essential (primary) hypertension: Secondary | ICD-10-CM | POA: Diagnosis present

## 2015-07-11 DIAGNOSIS — Z79899 Other long term (current) drug therapy: Secondary | ICD-10-CM

## 2015-07-11 DIAGNOSIS — J189 Pneumonia, unspecified organism: Secondary | ICD-10-CM | POA: Diagnosis present

## 2015-07-11 DIAGNOSIS — K639 Disease of intestine, unspecified: Secondary | ICD-10-CM | POA: Diagnosis present

## 2015-07-11 DIAGNOSIS — J9621 Acute and chronic respiratory failure with hypoxia: Secondary | ICD-10-CM | POA: Diagnosis present

## 2015-07-11 DIAGNOSIS — R0602 Shortness of breath: Secondary | ICD-10-CM

## 2015-07-11 DIAGNOSIS — Y95 Nosocomial condition: Secondary | ICD-10-CM | POA: Diagnosis present

## 2015-07-11 DIAGNOSIS — J910 Malignant pleural effusion: Secondary | ICD-10-CM | POA: Diagnosis not present

## 2015-07-11 DIAGNOSIS — I509 Heart failure, unspecified: Secondary | ICD-10-CM | POA: Diagnosis not present

## 2015-07-11 DIAGNOSIS — I5032 Chronic diastolic (congestive) heart failure: Secondary | ICD-10-CM | POA: Diagnosis present

## 2015-07-11 DIAGNOSIS — Z720 Tobacco use: Secondary | ICD-10-CM | POA: Diagnosis not present

## 2015-07-11 DIAGNOSIS — Z9889 Other specified postprocedural states: Secondary | ICD-10-CM

## 2015-07-11 DIAGNOSIS — Z79891 Long term (current) use of opiate analgesic: Secondary | ICD-10-CM

## 2015-07-11 DIAGNOSIS — J9611 Chronic respiratory failure with hypoxia: Secondary | ICD-10-CM | POA: Diagnosis not present

## 2015-07-11 DIAGNOSIS — Z791 Long term (current) use of non-steroidal anti-inflammatories (NSAID): Secondary | ICD-10-CM | POA: Diagnosis not present

## 2015-07-11 DIAGNOSIS — C799 Secondary malignant neoplasm of unspecified site: Secondary | ICD-10-CM | POA: Diagnosis present

## 2015-07-11 DIAGNOSIS — J948 Other specified pleural conditions: Secondary | ICD-10-CM | POA: Diagnosis not present

## 2015-07-11 HISTORY — DX: Pleural effusion, not elsewhere classified: J90

## 2015-07-11 LAB — COMPREHENSIVE METABOLIC PANEL
ALT: 10 U/L — ABNORMAL LOW (ref 17–63)
AST: 18 U/L (ref 15–41)
Albumin: 3.3 g/dL — ABNORMAL LOW (ref 3.5–5.0)
Alkaline Phosphatase: 72 U/L (ref 38–126)
Anion gap: 7 (ref 5–15)
BUN: 12 mg/dL (ref 6–20)
CO2: 31 mmol/L (ref 22–32)
Calcium: 8.7 mg/dL — ABNORMAL LOW (ref 8.9–10.3)
Chloride: 99 mmol/L — ABNORMAL LOW (ref 101–111)
Creatinine, Ser: 0.72 mg/dL (ref 0.61–1.24)
GFR calc Af Amer: >60 mL/min (ref >60)
GFR calc non Af Amer: >60 mL/min (ref >60)
Glucose, Bld: 97 mg/dL (ref 65–99)
Potassium: 3.1 mmol/L — ABNORMAL LOW (ref 3.5–5.1)
Sodium: 137 mmol/L (ref 135–145)
Total Bilirubin: 1.1 mg/dL (ref 0.3–1.2)
Total Protein: 6.7 g/dL (ref 6.5–8.1)

## 2015-07-11 LAB — CBC WITH DIFFERENTIAL/PLATELET
Basophils Absolute: 0 10*3/uL (ref 0.0–0.1)
Basophils Relative: 0 % (ref 0–1)
Eosinophils Absolute: 0.2 10*3/uL (ref 0.0–0.7)
Eosinophils Relative: 2 % (ref 0–5)
HCT: 37.9 % — ABNORMAL LOW (ref 39.0–52.0)
Hemoglobin: 13.4 g/dL (ref 13.0–17.0)
Lymphocytes Relative: 14 % (ref 12–46)
Lymphs Abs: 1.3 10*3/uL (ref 0.7–4.0)
MCH: 35.5 pg — ABNORMAL HIGH (ref 26.0–34.0)
MCHC: 35.4 g/dL (ref 30.0–36.0)
MCV: 100.5 fL — ABNORMAL HIGH (ref 78.0–100.0)
Monocytes Absolute: 1.3 10*3/uL — ABNORMAL HIGH (ref 0.1–1.0)
Monocytes Relative: 14 % — ABNORMAL HIGH (ref 3–12)
Neutro Abs: 6.7 10*3/uL (ref 1.7–7.7)
Neutrophils Relative %: 70 % (ref 43–77)
Platelets: 388 10*3/uL (ref 150–400)
RBC: 3.77 MIL/uL — ABNORMAL LOW (ref 4.22–5.81)
RDW: 15.2 % (ref 11.5–15.5)
WBC: 9.5 10*3/uL (ref 4.0–10.5)

## 2015-07-11 LAB — I-STAT CG4 LACTIC ACID, ED
Lactic Acid, Venous: 0.49 mmol/L — ABNORMAL LOW (ref 0.5–2.0)
Lactic Acid, Venous: 0.4 mmol/L — ABNORMAL LOW (ref 0.5–2.0)

## 2015-07-11 LAB — MAGNESIUM: Magnesium: 2 mg/dL (ref 1.7–2.4)

## 2015-07-11 MED ORDER — ALBUTEROL SULFATE (2.5 MG/3ML) 0.083% IN NEBU
2.5000 mg | INHALATION_SOLUTION | Freq: Four times a day (QID) | RESPIRATORY_TRACT | Status: DC | PRN
  Administered 2015-07-13: 2.5 mg via RESPIRATORY_TRACT
  Filled 2015-07-11 (×2): qty 3

## 2015-07-11 MED ORDER — PIPERACILLIN-TAZOBACTAM 3.375 G IVPB
3.3750 g | Freq: Once | INTRAVENOUS | Status: AC
  Administered 2015-07-11: 3.375 g via INTRAVENOUS
  Filled 2015-07-11: qty 50

## 2015-07-11 MED ORDER — HYDROMORPHONE HCL 1 MG/ML IJ SOLN
0.5000 mg | INTRAMUSCULAR | Status: DC | PRN
  Administered 2015-07-13 – 2015-07-15 (×4): 1 mg via INTRAVENOUS
  Filled 2015-07-11 (×4): qty 1

## 2015-07-11 MED ORDER — PIPERACILLIN-TAZOBACTAM 3.375 G IVPB
3.3750 g | Freq: Three times a day (TID) | INTRAVENOUS | Status: DC
  Administered 2015-07-12 – 2015-07-14 (×7): 3.375 g via INTRAVENOUS
  Filled 2015-07-11 (×8): qty 50

## 2015-07-11 MED ORDER — HYDRALAZINE HCL 20 MG/ML IJ SOLN: 10.0000 mg | Freq: Four times a day (QID) | INTRAMUSCULAR | Status: DC | PRN

## 2015-07-11 MED ORDER — ONDANSETRON HCL 4 MG/2ML IJ SOLN: 4.0000 mg | Freq: Four times a day (QID) | INTRAMUSCULAR | Status: DC | PRN

## 2015-07-11 MED ORDER — ONDANSETRON HCL 4 MG PO TABS: 4.0000 mg | ORAL_TABLET | Freq: Four times a day (QID) | ORAL | Status: DC | PRN

## 2015-07-11 MED ORDER — PIPERACILLIN-TAZOBACTAM 3.375 G IVPB
3.3750 g | Freq: Once | INTRAVENOUS | Status: DC
  Filled 2015-07-11: qty 50

## 2015-07-11 MED ORDER — POTASSIUM CHLORIDE CRYS ER 20 MEQ PO TBCR
40.0000 meq | EXTENDED_RELEASE_TABLET | Freq: Once | ORAL | Status: DC
  Filled 2015-07-11: qty 2

## 2015-07-11 MED ORDER — VANCOMYCIN HCL IN DEXTROSE 1-5 GM/200ML-% IV SOLN
1000.0000 mg | Freq: Two times a day (BID) | INTRAVENOUS | Status: DC
  Administered 2015-07-12 – 2015-07-14 (×5): 1000 mg via INTRAVENOUS
  Filled 2015-07-11 (×5): qty 200

## 2015-07-11 MED ORDER — ACETAMINOPHEN 650 MG RE SUPP: 650.0000 mg | Freq: Four times a day (QID) | RECTAL | Status: DC | PRN

## 2015-07-11 MED ORDER — ACETAMINOPHEN 325 MG PO TABS
650.0000 mg | ORAL_TABLET | Freq: Four times a day (QID) | ORAL | Status: DC | PRN
  Administered 2015-07-13 (×2): 650 mg via ORAL
  Filled 2015-07-11 (×2): qty 2

## 2015-07-11 MED ORDER — VANCOMYCIN HCL IN DEXTROSE 1-5 GM/200ML-% IV SOLN
1000.0000 mg | Freq: Once | INTRAVENOUS | Status: AC
  Administered 2015-07-11: 1000 mg via INTRAVENOUS
  Filled 2015-07-11: qty 200

## 2015-07-11 MED ORDER — SODIUM CHLORIDE 0.9 % IV SOLN
INTRAVENOUS | Status: AC
  Administered 2015-07-12: 1000 mL via INTRAVENOUS

## 2015-07-11 MED ORDER — ALUM & MAG HYDROXIDE-SIMETH 200-200-20 MG/5ML PO SUSP: 30.0000 mL | Freq: Four times a day (QID) | ORAL | Status: DC | PRN

## 2015-07-11 MED ORDER — OXYCODONE HCL 5 MG PO TABS
5.0000 mg | ORAL_TABLET | ORAL | Status: DC | PRN
  Administered 2015-07-13 – 2015-07-15 (×2): 5 mg via ORAL
  Filled 2015-07-11 (×2): qty 1

## 2015-07-11 NOTE — ED Notes (Signed)
Pt up to BR with O2 @ 2lpm via [place]; pt O2 sats 87% while ambulating on the O2

## 2015-07-11 NOTE — Telephone Encounter (Signed)
Patient called and left message saying he is having fluid drawn off his lung and he is also having some bleeding.  What needs to be done to get everything cleared up?  Discussed with Dr. Julien Nordmann (showed him note from Mathis Dad at Dr. Gustavus Bryant).  His recommendation is to go to ED and have this managed if O2 sats are this low. Called patient at requested number [phone]  And left him a message.and let him know that Dr. Julien Nordmann is recommending that he go to the ED to have this managed.  This would be a better option than trying to manage this on an out-patient basis. Requested that he call us back if any more questions.

## 2015-07-11 NOTE — H&P (Addendum)
[facility] Admission History and Physical       Henry [name] XBM:[id number] DOB: August 21, 1951 DOA: 07/11/2015  Referring physician: EDP PCP: Donnie Coffin, MD  Specialists:   Chief Complaint: Increased SOB and Cough  HPI: Henry [name] is a 64 y.o. male with a history of Metastatic Adenocarcinoma of Unknown Primary dx 06/2014 whose last chemotherapy Rx was 06/21/2015  who presents to the ED with complaints of worsening SOB and productive cough x 2 weeks.    He repors having chills, but no frank fevers, and reports that his cough has had blood and yellow mucus.  He had a CT scan  Of the chest and ABD yesterday for staging an revealed a new large Left Pleural Effusion and Post Obstructive Air Space Disease and a Loculated Right Pleural Effusion  S/P Talc Pleurodesis changes along with Chronic SMV thrombosis, and progressive Metastatic Disease of Vertebrae L1.   He was placed on IV Vancomycin and Zosyn and referred for admission.      Of Note:   Patient is S/P VATS, Drainage of Right Pleural Effusion, Pleural Biopsy, Partial Decortication and Talc Pleurodesis.  He has had 8 cycles of Chemotherapy on the agents:  Carboplatin and Alimta x 6 cycles, and now on single agent maintenance Rx of Alimta every 3 weeks.  His Oncologist is Dr. Aundra Dubin.      Review of Systems:  Constitutional: No Weight Loss, No Weight Gain, Night Sweats, Fevers, Chills, Dizziness, Light Headedness, Fatigue, or Generalized Weakness HEENT: No Headaches, Difficulty Swallowing,Tooth/Dental Problems,Sore Throat,  No Sneezing, Rhinitis, Ear Ache, Nasal Congestion, or Post Nasal Drip,  Cardio-vascular:  No Chest pain, Orthopnea, PND, Edema in Lower Extremities, Anasarca, Dizziness, Palpitations  Resp: +Dyspnea, No DOE, +Productive Cough, No Non-Productive Cough, No Hemoptysis, No Wheezing.    GI: No Heartburn, Indigestion, Abdominal Pain, Nausea, Vomiting, Diarrhea, Constipation, Hematemesis, Hematochezia, Melena, Change  in Bowel Habits,  Loss of Appetite  GU: No Dysuria, No Change in Color of Urine, No Urgency or Urinary Frequency, No Flank pain.  Musculoskeletal: No Joint Pain or Swelling, No Decreased Range of Motion, No Back Pain.  Neurologic: No Syncope, No Seizures, Muscle Weakness, Paresthesia, Vision Disturbance or Loss, No Diplopia, No Vertigo, No Difficulty Walking,  Skin: No Rash or Lesions. Psych: No Change in Mood or Affect, No Depression or Anxiety, No Memory loss, No Confusion, or Hallucinations   Past Medical History  Diagnosis Date  . Hypertension   . GERD (gastroesophageal reflux disease)   . Hemochromatosis   . Hx of adenomatous colonic polyps   . Umbilical hernia     none since weight loss  . Diverticulosis   . Lumbar spondylosis     ankle  . Pleural effusion on right 12/16/13    per chest xray  . Shortness of breath     with  exertion  . Chronic pancreatitis   . IBS (irritable bowel syndrome)   . Lung cancer 06/2014  . 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 Lumbar laminectomy  1983  . Knee arthrsoscopy  2003    left  . Tonsillectomy  1962  . Eus N/A 04/07/2013    Procedure: UPPER ENDOSCOPIC ULTRASOUND (EUS) LINEAR;  Surgeon: [name], MD;  Location: [facility];  Service: Endoscopy;  Laterality: N/A;  . Video assisted thoracoscopy (vats)/decortication Right 06/22/2014    Procedure: VIDEO ASSISTED THORACOSCOPY (VATS)/DECORTICATION;  Surgeon: [name], MD;  Location: [place];  Service: Thoracic;  Laterality: Right;  . Pleural effusion drainage Right 06/22/2014    Procedure: DRAINAGE OF PLEURAL EFFUSION;  Surgeon: [name], MD;  Location: [place];  Service: Thoracic;  Laterality: Right;  . Eus N/A 07/20/2014    Procedure: UPPER ENDOSCOPIC ULTRASOUND (EUS) LINEAR;  Surgeon: [name], MD;  Location: [facility];  Service: Endoscopy;  Laterality: N/A;      Prior to Admission medications    Medication Sig Start Date End Date Taking? Authorizing Provider  acetaminophen-codeine (TYLENOL #3) 300-30 MG per tablet Take 1-2 tablets by mouth every 4 (four) hours as needed for moderate pain.   Yes Historical Provider, MD  folic acid (FOLVITE) 1 MG tablet Take 1 tablet (1 mg total) by mouth daily. 11/29/14  Yes Curt Bears, MD  furosemide (LASIX) 40 MG tablet Take 40 mg by mouth every morning.  04/03/15  Yes Historical Provider, MD  guaiFENesin (MUCINEX) 600 MG 12 hr tablet Take 600 mg by mouth daily.   Yes Historical Provider, MD  ibuprofen (ADVIL,MOTRIN) 200 MG tablet Take 400 mg by mouth every 6 (six) hours as needed (Pain).   Yes Historical Provider, MD  metoprolol succinate (TOPROL-XL) 50 MG 24 hr tablet Take 50 mg by mouth daily after lunch.  03/29/15  Yes Historical Provider, MD  mometasone-formoterol (DULERA) 200-5 MCG/ACT AERO Take 2 puffs first thing in am and then another 2 puffs about 12 hours later. 07/03/15  Yes Tanda Rockers, MD  potassium chloride SA (K-DUR,KLOR-CON) 20 MEQ tablet Take 40 mEq by mouth daily.    Yes Historical Provider, MD  PROAIR HFA 108 (90 BASE) MCG/ACT inhaler Inhale 1 each into the lungs 2 (two) times daily. 07/04/15  Yes Historical Provider, MD  spironolactone (ALDACTONE) 25 MG tablet Take 25 mg by mouth daily after lunch.   Yes Historical Provider, MD  verapamil (VERELAN PM) 240 MG 24 hr capsule Take 240 mg by mouth daily. 05/27/15  Yes Historical Provider, MD  EPIPEN 2-PAK 0.3 MG/0.3ML SOAJ injection See admin instructions. 05/31/15   Historical Provider, MD  oxyCODONE (OXY IR/ROXICODONE) 5 MG immediate release tablet Take 1 tablet (5 mg total) by mouth every 4 (four) hours as needed for moderate pain. 06/21/15   Curt Bears, MD  Pancrelipase, Lip-Prot-Amyl, [phone] UNITS TABS Take 2 tablets by mouth 3 (three) times daily with meals. Patient not taking: Reported on 07/11/2015 07/25/14   [name], MD  UNABLE TO FIND Med Name: Oxygen concentrator.  Per  medical necessity pt needs portable oxygen concentrator via nasal cannula with activity. 05/10/15   Curt Bears, MD     Allergies  Allergen Reactions  . Other Diarrhea    Red meats     Social History:  reports that he has been smoking Cigarettes.  He has a 80 pack-year smoking history. He has never used smokeless tobacco. He reports that he drinks about 12.6 oz of alcohol per week. He reports that he does not use illicit drugs.    Family History  Problem Relation Age of Onset  . Colon cancer Maternal Grandmother 52  . Stomach cancer Paternal Grandfather 76  . Hypertension Mother   . Varicose Veins Mother   . Hypertension Father   . Varicose Veins Father   . Hypertension Sister   . Heart disease Sister        Physical Exam:  GEN:  Pleasant Thin 64 y.o. Caucasian male examined and in no acute distress; cooperative with exam Filed Vitals:  07/11/15 1930 07/11/15 2000 07/11/15 2030 07/11/15 2130  BP: 155/101 161/99 164/99 150/101  Pulse: 87  97 91  Temp:      TempSrc:      Resp:   25 19  SpO2: 100%  93% 95%   Blood pressure 150/101, pulse 91, temperature 98.1 F (36.7 C), temperature source Oral, resp. rate 19, SpO2 95 %. PSYCH: He is alert and oriented x4; does not appear anxious does not appear depressed; affect is normal HEENT: Normocephalic and Atraumatic, Mucous membranes pink; PERRLA; EOM intact; Fundi:  Benign;  No scleral icterus, Nares: Patent, Oropharynx: Clear, Fair Dentition,    Neck:  FROM, No Cervical Lymphadenopathy nor Thyromegaly or Carotid Bruit; No JVD; Breasts:: Not examined CHEST WALL: No tenderness CHEST: Normal respiration, clear to auscultation bilaterally HEART: Regular rate and rhythm; no murmurs rubs or gallops BACK: No kyphosis or scoliosis; No CVA tenderness ABDOMEN: Positive Bowel Sounds, Soft Non-Tender, No Rebound or Guarding; No Masses, No Organomegaly. Rectal Exam: Not done EXTREMITIES: No Cyanosis, Clubbing, Trace Edema BLEs; No  Ulcerations. Genitalia: not examined PULSES: 2+ and symmetric SKIN: Normal hydration no rash or ulceration CNS:  Alert and Oriented x 4, No Focal Deficits Vascular: pulses palpable throughout    Labs on Admission:  Basic Metabolic Panel:  Recent Labs Lab 07/11/15 1912  NA 137  K 3.1*  CL 99*  CO2 31  GLUCOSE 97  BUN 12  CREATININE 0.72  CALCIUM 8.7*   Liver Function Tests:  Recent Labs Lab 07/11/15 1912  AST 18  ALT 10*  ALKPHOS 72  BILITOT 1.1  PROT 6.7  ALBUMIN 3.3*   No results for input(s): LIPASE, AMYLASE in the last 168 hours. No results for input(s): AMMONIA in the last 168 hours. CBC:  Recent Labs Lab 07/11/15 1912  WBC 9.5  NEUTROABS 6.7  HGB 13.4  HCT 37.9*  MCV 100.5*  PLT 388   Cardiac Enzymes: No results for input(s): CKTOTAL, CKMB, CKMBINDEX, TROPONINI in the last 168 hours.  BNP (last 3 results) No results for input(s): BNP in the last 8760 hours.  ProBNP (last 3 results) No results for input(s): PROBNP in the last 8760 hours.  CBG: No results for input(s): GLUCAP in the last 168 hours.  Radiological Exams on Admission: Dg Chest 2 View  07/11/2015   CLINICAL DATA:  Lung cancer on chemotherapy, increased shortness of breath for 1 week, fluid on lungs, productive cough for 3 months, hypertension  EXAM: CHEST  2 VIEW  COMPARISON:  CT chest 07/10/2015  FINDINGS: Enlargement of cardiac silhouette.  Tortuous aorta.  Mediastinal contours and pulmonary vascularity normal.  Bibasilar effusions LEFT greater than RIGHT similar to prior CT.  Bibasilar pulmonary opacities may represent infiltrate or atelectasis, little changed from prior CT.  Upper lungs grossly clear.  No pneumothorax.  Bones demineralized.  IMPRESSION: Bibasilar pleural effusions LEFT greater than RIGHT with bibasilar pulmonary opacities that may represent atelectasis or consolidation.  Little interval change.   Electronically Signed   By: Lavonia Dana M.D.   On: 07/11/2015 18:57    Ct Chest W Contrast  07/10/2015   CLINICAL DATA:  Restaging lung cancer. Initial diagnosis 2015. History of right lobectomy.  EXAM: CT CHEST, ABDOMEN, AND PELVIS WITH CONTRAST  TECHNIQUE: Multidetector CT imaging of the chest, abdomen and pelvis was performed following the standard protocol during bolus administration of intravenous contrast.  CONTRAST:  166m OMNIPAQUE IOHEXOL 300 MG/ML  SOLN  COMPARISON:  CT 05/01/2015 and PET-CT 06/30/2014  FINDINGS: CT CHEST FINDINGS  Chest wall: Stable small axillary lymph nodes but no mass or overt adenopathy. The thyroid gland appears Stable. Stable small nodules. No supraclavicular adenopathy. The bony thorax is intact. No obvious metastatic lesions involving the thoracic spine or sternum. No obvious rib lesions. Progressive metastatic disease involving the L1 vertebral body.  Mediastinum: The heart is mildly enlarged but stable. Stable tortuosity, ectasia and calcification of the thoracic aorta. Stable small scattered mediastinal lymph nodes. The esophagus is grossly normal.  Lungs/ pleura: Persistent loculated pleural fluid collection on the right side with density along the pleural surface likely due to previous talc pleurodesis. Persistent overlying long atelectasis and right basilar airspace consolidation. Stable bilobed masslike densities in the right middle lobe and right lower lobe on image number 42.  There is a new large left pleural effusion with areas of probable loculation. I do not see any obvious enhancing pleural nodules. A left-sided thoracentesis may be useful for therapeutic and diagnostic purposes and to exclude malignant left effusion.  CT ABDOMEN AND PELVIS FINDINGS  Hepatobiliary: Stable scattered hepatic cysts. No findings suspicious for hepatic metastatic disease. The portal vein is patent.  Pancreas: Stable small cystic lesion in the pancreatic head. No inflammation or ductal dilatation.  Spleen: Normal size.  No focal lesions.   Adrenals/Urinary Tract: Bilateral nodular enlargement of both adrenal glands which could reflect nodular hyperplasia. No obvious mass/metastasis. The kidneys are unremarkable and stable.  Stomach/Bowel: The stomach wall is thickened but this is likely due to under distension. No mass or obstruction. The small bowel is grossly normal. No inflammation or obstruction. Enlarging Masses involving the ascending colon. These could be adenomas or possible metastasis.  Vascular/Lymphatic: Stable tortuosity, ectasia and calcification of the abdominal aorta. The branch vessels are patent. 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 The SMA may also be partially occluded/thrombosed. No mesenteric or retroperitoneal mass or adenopathy. Small scattered lymph nodes are noted.  Other: Progression of abdominal/ pelvic ascites without obvious omental or peritoneal surface implants. The bladder appears normal. No pelvic mass or adenopathy. No inguinal mass or adenopathy.  Musculoskeletal: No significant bony findings in the pelvis. Progressive L1 sclerotic metastatic disease.  IMPRESSION: 1. Persistent right-sided loculated pleural fluid collection and evidence of prior talc pleurodesis. No obvious enhancing pleural masses. Persistent right basilar airspace consolidation and stable bilobed masslike densities in the right middle lobe and right lower lobe. 2. New large left pleural effusion with areas of probable loculation. No obvious enhancing pleural nodules but there left-sided thoracentesis may be useful for diagnostic and therapeutic purposes. 3. Progressive abdominal/pelvic ascites. 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 4. Stable nodular enlargement of both adrenal glands. 5. Enlarging soft tissue masses involving the ascending colon. These could be adenomas or metastasis. 6.  Progressive sclerotic metastatic disease involving the L1 vertebral body. No definite new bone metastasis.   Electronically Signed   By: Marijo Sanes M.D.   On: 07/10/2015 14:28   Ct Abdomen Pelvis W Contrast  07/10/2015   CLINICAL DATA:  Restaging lung cancer. Initial diagnosis 2015. History of right lobectomy.  EXAM: CT CHEST, ABDOMEN, AND PELVIS WITH CONTRAST  TECHNIQUE: Multidetector CT imaging of the chest, abdomen and pelvis was performed following the standard protocol during bolus administration of intravenous contrast.  CONTRAST:  163m OMNIPAQUE IOHEXOL 300 MG/ML  SOLN  COMPARISON:  CT 05/01/2015 and PET-CT 06/30/2014  FINDINGS: CT CHEST  FINDINGS  Chest wall: Stable small axillary lymph nodes but no mass or overt adenopathy. The thyroid gland appears Stable. Stable small nodules. No supraclavicular adenopathy. The bony thorax is intact. No obvious metastatic lesions involving the thoracic spine or sternum. No obvious rib lesions. Progressive metastatic disease involving the L1 vertebral body.  Mediastinum: The heart is mildly enlarged but stable. Stable tortuosity, ectasia and calcification of the thoracic aorta. Stable small scattered mediastinal lymph nodes. The esophagus is grossly normal.  Lungs/ pleura: Persistent loculated pleural fluid collection on the right side with density along the pleural surface likely due to previous talc pleurodesis. Persistent overlying long atelectasis and right basilar airspace consolidation. Stable bilobed masslike densities in the right middle lobe and right lower lobe on image number 42.  There is a new large left pleural effusion with areas of probable loculation. I do not see any obvious enhancing pleural nodules. A left-sided thoracentesis may be useful for therapeutic and diagnostic purposes and to exclude malignant left effusion.  CT ABDOMEN AND PELVIS FINDINGS  Hepatobiliary: Stable scattered hepatic cysts. No findings suspicious for hepatic metastatic  disease. The portal vein is patent.  Pancreas: Stable small cystic lesion in the pancreatic head. No inflammation or ductal dilatation.  Spleen: Normal size.  No focal lesions.  Adrenals/Urinary Tract: Bilateral nodular enlargement of both adrenal glands which could reflect nodular hyperplasia. No obvious mass/metastasis. The kidneys are unremarkable and stable.  Stomach/Bowel: The stomach wall is thickened but this is likely due to under distension. No mass or obstruction. The small bowel is grossly normal. No inflammation or obstruction. Enlarging Masses involving the ascending colon. These could be adenomas or possible metastasis.  Vascular/Lymphatic: Stable tortuosity, ectasia and calcification of the abdominal aorta. The branch vessels are patent. 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 The SMA may also be partially occluded/thrombosed. No mesenteric or retroperitoneal mass or adenopathy. Small scattered lymph nodes are noted.  Other: Progression of abdominal/ pelvic ascites without obvious omental or peritoneal surface implants. The bladder appears normal. No pelvic mass or adenopathy. No inguinal mass or adenopathy.  Musculoskeletal: No significant bony findings in the pelvis. Progressive L1 sclerotic metastatic disease.  IMPRESSION: 1. Persistent right-sided loculated pleural fluid collection and evidence of prior talc pleurodesis. No obvious enhancing pleural masses. Persistent right basilar airspace consolidation and stable bilobed masslike densities in the right middle lobe and right lower lobe. 2. New large left pleural effusion with areas of probable loculation. No obvious enhancing pleural nodules but there left-sided thoracentesis may be useful for diagnostic and therapeutic purposes. 3. Progressive abdominal/pelvic ascites. 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 Possible chronic SMV  thrombosis also. 4. Stable nodular enlargement of both adrenal glands. 5. Enlarging soft tissue masses involving the ascending colon. These could be adenomas or metastasis. 6. Progressive sclerotic metastatic disease involving the L1 vertebral body. No definite new bone metastasis.   Electronically Signed   By: Marijo Sanes M.D.   On: 07/10/2015 14:28     EKG: Independently reviewed.     Assessment/Plan:   64 y.o. male with  Principal Problem:   1.    HCAP (healthcare-associated pneumonia)   IV Vancomycin and Zosyn   IVFs   Albuterol Nebs PRN   NCO2   Active Problems:   2.    Pleural effusion   IR Evaluation for Pleuro/Thocentesis in AM   NPO after Midnight      3.  Chronic Respiratory Failure   On NCO2 at  2 Liters      4.    Hypokalemia   Replace K+   Check Magnesium Level     5.    Metastatic Adenocarcinoma of Unknown Primary/Lung cancer- Last Chemo RX 3 weeks ago, Next Chemo was due tomorrow 08/25   Notify Oncology in AM    Dr Earlie Server     6.    HTN (hypertension)   PRN IV Hydralazine for SBP > 160   On Metoprolol, Verapamil and Spironolactone Rx     7.    Hemochromatosis   Hx     8.    Cigarette smoker   Nicotine Patch daily     9.    DVT Prophylaxis   SCDs         Code Status:     FULL CODE       Family Communication:    No Family Present    Disposition Plan:    Inpatient Status        Time spent: [facility] Pager [phone]   If [place] Please Contact the Day Rounding Team MD for [facility]  If 7PM-7AM, Please Contact Night-Floor Coverage  [email or web address] Password [email or web address] [place] 07/11/2015, 9:38 PM     ADDENDUM:   Patient was seen and examined on 07/11/2015

## 2015-07-11 NOTE — ED Notes (Addendum)
Pt reports currently being treated for lung cancer, received chemo 8/4, took abx 2 weeks ago. Today pt reports he "has fluid on his lungs" per CT scan last week. Increased SOB x1 week. Productive cough x3 months. Started wearing oxygen continuously 2 L [place]. Denies pain.   ETOH use daily, 2x/day, cigarettes 1pack/day x40 years.

## 2015-07-11 NOTE — Telephone Encounter (Signed)
LM x 1 for pt to return call to give rec's per Dr Melvyn Novas.

## 2015-07-11 NOTE — Telephone Encounter (Signed)
lmomtcb x1 for pt 

## 2015-07-11 NOTE — Telephone Encounter (Signed)
Patient says that he has been coughing up bloody mucus for a while.  He is going to the hospital on Friday to have fluid removed from his lungs and wants to know if this will take care of the blood that he is coughing up.  While talking to patient on the phone, he could barely talk because he could not catch his breath.  Patient put me on hold while he tried to catch his breath. His breathing has gotten worse within the past 4 days. Patient wants to know if he needs to have anything else done other than drainage from his lungs.  Patient says that his oxygen level drops rapidly.  He says that he can walk 8 steps and his oxygen will drop.  He says that if he lays on his back at all, his oxygen will drop below 81%.    Dr. Melvyn Novas, please advise.

## 2015-07-11 NOTE — Telephone Encounter (Signed)
Pt returned call to nurse [phone]

## 2015-07-11 NOTE — ED Notes (Signed)
Patient transported to X-ray 

## 2015-07-11 NOTE — Telephone Encounter (Signed)
Nothing else to do as outpt but if his breathing is that bad he needs to go to the ER and they can do the procedure as an inpt > one compromise would be to move up the thoracentesis as outpt to whichever hosp ir could get to it first (shouldn't have to wait until 8/26 either way)

## 2015-07-11 NOTE — ED Notes (Signed)
Bed: WA14 Expected date:  Expected time:  Means of arrival:  Comments: Triage 2  

## 2015-07-11 NOTE — ED Provider Notes (Signed)
CSN: [id number]     Arrival date & time 07/11/15  1746 History   First MD Initiated Contact with Patient 07/11/15 1824     Chief Complaint  Patient presents with  . ca pt, SOB      (Consider location/radiation/quality/duration/timing/severity/associated sxs/prior Treatment) HPI Complains of nonproductive cough and shortness of breath for the past 2 week. He denies any fever. Dyspnea is worse with exertion improved with rest. No other associated symptoms. He's been using his albuterol inhaler without relief. No vomiting. No other associated symptoms Past Medical History  Diagnosis Date  . Hypertension   . GERD (gastroesophageal reflux disease)   . Hemochromatosis   . Hx of adenomatous colonic polyps   . Umbilical hernia     none since weight loss  . Diverticulosis   . Lumbar spondylosis     ankle  . Pleural effusion on right 12/16/13    per chest xray  . Shortness of breath     with  exertion  . Chronic pancreatitis   . IBS (irritable bowel syndrome)   . Lung cancer 06/2014  . 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 Lumbar laminectomy  1983  . Knee arthrsoscopy  2003    left  . Tonsillectomy  1962  . Eus N/A 04/07/2013    Procedure: UPPER ENDOSCOPIC ULTRASOUND (EUS) LINEAR;  Surgeon: [name], MD;  Location: [facility];  Service: Endoscopy;  Laterality: N/A;  . Video assisted thoracoscopy (vats)/decortication Right 06/22/2014    Procedure: VIDEO ASSISTED THORACOSCOPY (VATS)/DECORTICATION;  Surgeon: [name], MD;  Location: [facility];  Service: Thoracic;  Laterality: Right;  . Pleural effusion drainage Right 06/22/2014    Procedure: DRAINAGE OF PLEURAL EFFUSION;  Surgeon: [name], MD;  Location: [facility];  Service: Thoracic;  Laterality: Right;  . Eus N/A 07/20/2014    Procedure: UPPER ENDOSCOPIC ULTRASOUND (EUS) LINEAR;  Surgeon: [name], MD;  Location: [facility];  Service: Endoscopy;   Laterality: N/A;   Family History  Problem Relation Age of Onset  . Colon cancer Maternal Grandmother 60  . Stomach cancer Paternal Grandfather 84  . Hypertension Mother   . Varicose Veins Mother   . Hypertension Father   . Varicose Veins Father   . Hypertension Sister   . Heart disease Sister    Social History  Substance Use Topics  . Smoking status: Current Every Day Smoker -- 2.00 packs/day for 40 years    Types: Cigarettes  . Smokeless tobacco: Never Used     Comment: 12/ to 3/4 packer per day now  . Alcohol Use: 12.6 oz/week    21 Cans of beer per week     Comment: Daily 2-3 beers    Review of Systems  Constitutional: Negative.   HENT: Negative.   Respiratory: Positive for cough and shortness of breath.   Cardiovascular: Negative.   Gastrointestinal: Negative.   Musculoskeletal: Negative.   Skin: Negative.   Neurological: Negative.   Psychiatric/Behavioral: Negative.   All other systems reviewed and are negative.     Allergies  Other  Home Medications   Prior to Admission medications   Medication Sig Start Date End Date Taking? Authorizing Provider  EPIPEN 2-PAK 0.3 MG/0.3ML SOAJ injection See admin instructions. 05/31/15   Historical Provider, MD  folic acid (FOLVITE) 1 MG tablet Take 1 tablet (1 mg total) by mouth daily. 11/29/14   Curt Bears, MD  furosemide (LASIX) 40 MG tablet  Take 40 mg by mouth every morning.  04/03/15   Historical Provider, MD  ibuprofen (ADVIL,MOTRIN) 200 MG tablet Take 400 mg by mouth every 6 (six) hours as needed (Pain).    Historical Provider, MD  metoprolol succinate (TOPROL-XL) 50 MG 24 hr tablet Take 50 mg by mouth daily after lunch.  03/29/15   Historical Provider, MD  mometasone-formoterol (DULERA) 200-5 MCG/ACT AERO Take 2 puffs first thing in am and then another 2 puffs about 12 hours later. 07/03/15   Tanda Rockers, MD  oxyCODONE (OXY IR/ROXICODONE) 5 MG immediate release tablet Take 1 tablet (5 mg total) by mouth every 4  (four) hours as needed for moderate pain. 06/21/15   Curt Bears, MD  Pancrelipase, Lip-Prot-Amyl, [phone] UNITS TABS Take 2 tablets by mouth 3 (three) times daily with meals. 07/25/14   [name], MD  potassium chloride SA (K-DUR,KLOR-CON) 20 MEQ tablet Take 20 mEq by mouth daily.    Historical Provider, MD  spironolactone (ALDACTONE) 25 MG tablet Take 25 mg by mouth daily after lunch.    Historical Provider, MD  [place] Name: Oxygen concentrator.  Per medical necessity pt needs portable oxygen concentrator via nasal cannula with activity. 05/10/15   Curt Bears, MD  verapamil (VERELAN PM) 240 MG 24 hr capsule Take 240 mg by mouth daily. 05/27/15   Historical Provider, MD   BP 161/99 mmHg  Pulse 87  Temp(Src) 98.1 F (36.7 C) (Oral)  Resp 20  SpO2 100% Physical Exam  Constitutional: He appears well-developed and well-nourished.  HENT:  Head: Normocephalic and atraumatic.  Eyes: Conjunctivae are normal. Pupils are equal, round, and reactive to light.  Neck: Neck supple. No tracheal deviation present. No thyromegaly present.  Cardiovascular: Normal rate and regular rhythm.   No murmur heard. Pulmonary/Chest: Effort normal.  Diffuse rhonchi  Abdominal: Soft. Bowel sounds are normal. He exhibits no distension. There is no tenderness.  Musculoskeletal: Normal range of motion. He exhibits no edema or tenderness.  Neurological: He is alert. Coordination normal.  Skin: Skin is warm and dry. No rash noted.  Psychiatric: He has a normal mood and affect.  Nursing note and vitals reviewed.   ED Course  Procedures (including critical care time) Labs Review Labs Reviewed  COMPREHENSIVE METABOLIC PANEL - Abnormal; Notable for the following:    Potassium 3.1 (*)    Chloride 99 (*)    Calcium 8.7 (*)    Albumin 3.3 (*)    ALT 10 (*)    All other components within normal limits  CBC WITH DIFFERENTIAL/PLATELET - Abnormal; Notable for the following:    RBC 3.77 (*)    HCT  37.9 (*)    MCV 100.5 (*)    MCH 35.5 (*)    Monocytes Relative 14 (*)    Monocytes Absolute 1.3 (*)    All other components within normal limits  I-STAT CG4 LACTIC ACID, ED - Abnormal; Notable for the following:    Lactic Acid, Venous 0.49 (*)    All other components within normal limits    Imaging Review Dg Chest 2 View  07/11/2015   CLINICAL DATA:  Lung cancer on chemotherapy, increased shortness of breath for 1 week, fluid on lungs, productive cough for 3 months, hypertension  EXAM: CHEST  2 VIEW  COMPARISON:  CT chest 07/10/2015  FINDINGS: Enlargement of cardiac silhouette.  Tortuous aorta.  Mediastinal contours and pulmonary vascularity normal.  Bibasilar effusions LEFT greater than RIGHT similar to prior CT.  Bibasilar pulmonary opacities may represent infiltrate or atelectasis, little changed from prior CT.  Upper lungs grossly clear.  No pneumothorax.  Bones demineralized.  IMPRESSION: Bibasilar pleural effusions LEFT greater than RIGHT with bibasilar pulmonary opacities that may represent atelectasis or consolidation.  Little interval change.   Electronically Signed   By: Lavonia Dana M.D.   On: 07/11/2015 18:57   Ct Chest W Contrast  07/10/2015   CLINICAL DATA:  Restaging lung cancer. Initial diagnosis 2015. History of right lobectomy.  EXAM: CT CHEST, ABDOMEN, AND PELVIS WITH CONTRAST  TECHNIQUE: Multidetector CT imaging of the chest, abdomen and pelvis was performed following the standard protocol during bolus administration of intravenous contrast.  CONTRAST:  170m OMNIPAQUE IOHEXOL 300 MG/ML  SOLN  COMPARISON:  CT 05/01/2015 and PET-CT 06/30/2014  FINDINGS: CT CHEST FINDINGS  Chest wall: Stable small axillary lymph nodes but no mass or overt adenopathy. The thyroid gland appears Stable. Stable small nodules. No supraclavicular adenopathy. The bony thorax is intact. No obvious metastatic lesions involving the thoracic spine or sternum. No obvious rib lesions. Progressive metastatic  disease involving the L1 vertebral body.  Mediastinum: The heart is mildly enlarged but stable. Stable tortuosity, ectasia and calcification of the thoracic aorta. Stable small scattered mediastinal lymph nodes. The esophagus is grossly normal.  Lungs/ pleura: Persistent loculated pleural fluid collection on the right side with density along the pleural surface likely due to previous talc pleurodesis. Persistent overlying long atelectasis and right basilar airspace consolidation. Stable bilobed masslike densities in the right middle lobe and right lower lobe on image number 42.  There is a new large left pleural effusion with areas of probable loculation. I do not see any obvious enhancing pleural nodules. A left-sided thoracentesis may be useful for therapeutic and diagnostic purposes and to exclude malignant left effusion.  CT ABDOMEN AND PELVIS FINDINGS  Hepatobiliary: Stable scattered hepatic cysts. No findings suspicious for hepatic metastatic disease. The portal vein is patent.  Pancreas: Stable small cystic lesion in the pancreatic head. No inflammation or ductal dilatation.  Spleen: Normal size.  No focal lesions.  Adrenals/Urinary Tract: Bilateral nodular enlargement of both adrenal glands which could reflect nodular hyperplasia. No obvious mass/metastasis. The kidneys are unremarkable and stable.  Stomach/Bowel: The stomach wall is thickened but this is likely due to under distension. No mass or obstruction. The small bowel is grossly normal. No inflammation or obstruction. Enlarging Masses involving the ascending colon. These could be adenomas or possible metastasis.  Vascular/Lymphatic: Stable tortuosity, ectasia and calcification of the abdominal aorta. The branch vessels are patent. 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 The SMA may also be partially occluded/thrombosed. No mesenteric or retroperitoneal mass or adenopathy. Small scattered lymph  nodes are noted.  Other: Progression of abdominal/ pelvic ascites without obvious omental or peritoneal surface implants. The bladder appears normal. No pelvic mass or adenopathy. No inguinal mass or adenopathy.  Musculoskeletal: No significant bony findings in the pelvis. Progressive L1 sclerotic metastatic disease.  IMPRESSION: 1. Persistent right-sided loculated pleural fluid collection and evidence of prior talc pleurodesis. No obvious enhancing pleural masses. Persistent right basilar airspace consolidation and stable bilobed masslike densities in the right middle lobe and right lower lobe. 2. New large left pleural effusion with areas of probable loculation. No obvious enhancing pleural nodules but there left-sided thoracentesis may be useful for diagnostic and therapeutic purposes. 3. Progressive abdominal/pelvic ascites. 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 4. Stable nodular enlargement of both adrenal glands. 5. Enlarging soft tissue masses involving the ascending colon. These could be adenomas or metastasis. 6. Progressive sclerotic metastatic disease involving the L1 vertebral body. No definite new bone metastasis.   Electronically Signed   By: Marijo Sanes M.D.   On: 07/10/2015 14:28   Ct Abdomen Pelvis W Contrast  07/10/2015   CLINICAL DATA:  Restaging lung cancer. Initial diagnosis 2015. History of right lobectomy.  EXAM: CT CHEST, ABDOMEN, AND PELVIS WITH CONTRAST  TECHNIQUE: Multidetector CT imaging of the chest, abdomen and pelvis was performed following the standard protocol during bolus administration of intravenous contrast.  CONTRAST:  135m OMNIPAQUE IOHEXOL 300 MG/ML  SOLN  COMPARISON:  CT 05/01/2015 and PET-CT 06/30/2014  FINDINGS: CT CHEST FINDINGS  Chest wall: Stable small axillary lymph nodes but no mass or overt adenopathy. The thyroid gland appears Stable. Stable small nodules. No  supraclavicular adenopathy. The bony thorax is intact. No obvious metastatic lesions involving the thoracic spine or sternum. No obvious rib lesions. Progressive metastatic disease involving the L1 vertebral body.  Mediastinum: The heart is mildly enlarged but stable. Stable tortuosity, ectasia and calcification of the thoracic aorta. Stable small scattered mediastinal lymph nodes. The esophagus is grossly normal.  Lungs/ pleura: Persistent loculated pleural fluid collection on the right side with density along the pleural surface likely due to previous talc pleurodesis. Persistent overlying long atelectasis and right basilar airspace consolidation. Stable bilobed masslike densities in the right middle lobe and right lower lobe on image number 42.  There is a new large left pleural effusion with areas of probable loculation. I do not see any obvious enhancing pleural nodules. A left-sided thoracentesis may be useful for therapeutic and diagnostic purposes and to exclude malignant left effusion.  CT ABDOMEN AND PELVIS FINDINGS  Hepatobiliary: Stable scattered hepatic cysts. No findings suspicious for hepatic metastatic disease. The portal vein is patent.  Pancreas: Stable small cystic lesion in the pancreatic head. No inflammation or ductal dilatation.  Spleen: Normal size.  No focal lesions.  Adrenals/Urinary Tract: Bilateral nodular enlargement of both adrenal glands which could reflect nodular hyperplasia. No obvious mass/metastasis. The kidneys are unremarkable and stable.  Stomach/Bowel: The stomach wall is thickened but this is likely due to under distension. No mass or obstruction. The small bowel is grossly normal. No inflammation or obstruction. Enlarging Masses involving the ascending colon. These could be adenomas or possible metastasis.  Vascular/Lymphatic: Stable tortuosity, ectasia and calcification of the abdominal aorta. The branch vessels are patent. 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 The SMA may also be partially occluded/thrombosed. No mesenteric or retroperitoneal mass or adenopathy. Small scattered lymph nodes are noted.  Other: Progression of abdominal/ pelvic ascites without obvious omental or peritoneal surface implants. The bladder appears normal. No pelvic mass or adenopathy. No inguinal mass or adenopathy.  Musculoskeletal: No significant bony findings in the pelvis. Progressive L1 sclerotic metastatic disease.  IMPRESSION: 1. Persistent right-sided loculated pleural fluid collection and evidence of prior talc pleurodesis. No obvious enhancing pleural masses. Persistent right basilar airspace consolidation and stable bilobed masslike densities in the right middle lobe and right lower lobe. 2. New large left pleural effusion with areas of probable loculation. No obvious enhancing pleural nodules but there left-sided thoracentesis may be useful for diagnostic and therapeutic purposes. 3. Progressive abdominal/pelvic ascites. 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 Possible chronic SMV  thrombosis also. 4. Stable nodular enlargement of both adrenal glands. 5. Enlarging soft tissue masses involving the ascending colon. These could be adenomas or metastasis. 6. Progressive sclerotic metastatic disease involving the L1 vertebral body. No definite new bone metastasis.   Electronically Signed   By: Marijo Sanes M.D.   On: 07/10/2015 14:28   I have personally reviewed and evaluated these images and lab results as part of my medical decision-making.   EKG Interpretation None     Chest x-ray viewed by me. CT chest report from yesterday reviewed by me. Results for orders placed or performed during the hospital encounter of 07/11/15  Comprehensive metabolic panel  Result Value Ref Range   Sodium 137 135 - 145 mmol/L   Potassium 3.1 (L) 3.5 - 5.1 mmol/L   Chloride 99 (L) 101 -  111 mmol/L   CO2 31 22 - 32 mmol/L   Glucose, Bld 97 65 - 99 mg/dL   BUN 12 6 - 20 mg/dL   Creatinine, Ser 0.72 0.61 - 1.24 mg/dL   Calcium 8.7 (L) 8.9 - 10.3 mg/dL   Total Protein 6.7 6.5 - 8.1 g/dL   Albumin 3.3 (L) 3.5 - 5.0 g/dL   AST 18 15 - 41 U/L   ALT 10 (L) 17 - 63 U/L   Alkaline Phosphatase 72 38 - 126 U/L   Total Bilirubin 1.1 0.3 - 1.2 mg/dL   GFR calc non Af Amer >60 >60 mL/min   GFR calc Af Amer >60 >60 mL/min   Anion gap 7 5 - 15  CBC with Differential  Result Value Ref Range   WBC 9.5 4.0 - 10.5 K/uL   RBC 3.77 (L) 4.22 - 5.81 MIL/uL   Hemoglobin 13.4 13.0 - 17.0 g/dL   HCT 37.9 (L) 39.0 - 52.0 %   MCV 100.5 (H) 78.0 - 100.0 fL   MCH 35.5 (H) 26.0 - 34.0 pg   MCHC 35.4 30.0 - 36.0 g/dL   RDW 15.2 11.5 - 15.5 %   Platelets 388 150 - 400 K/uL   Neutrophils Relative % 70 43 - 77 %   Neutro Abs 6.7 1.7 - 7.7 K/uL   Lymphocytes Relative 14 12 - 46 %   Lymphs Abs 1.3 0.7 - 4.0 K/uL   Monocytes Relative 14 (H) 3 - 12 %   Monocytes Absolute 1.3 (H) 0.1 - 1.0 K/uL   Eosinophils Relative 2 0 - 5 %   Eosinophils Absolute 0.2 0.0 - 0.7 K/uL   Basophils Relative 0 0 - 1 %   Basophils Absolute 0.0 0.0 - 0.1 K/uL  I-Stat CG4 Lactic Acid, ED  (not at [facility])  Result Value Ref Range   Lactic Acid, Venous 0.49 (L) 0.5 - 2.0 mmol/L   Dg Chest 2 View  07/11/2015   CLINICAL DATA:  Lung cancer on chemotherapy, increased shortness of breath for 1 week, fluid on lungs, productive cough for 3 months, hypertension  EXAM: CHEST  2 VIEW  COMPARISON:  CT chest 07/10/2015  FINDINGS: Enlargement of cardiac silhouette.  Tortuous aorta.  Mediastinal contours and pulmonary vascularity normal.  Bibasilar effusions LEFT greater than RIGHT similar to prior CT.  Bibasilar pulmonary opacities may represent infiltrate or atelectasis, little changed from prior CT.  Upper lungs grossly clear.  No pneumothorax.  Bones demineralized.  IMPRESSION: Bibasilar pleural effusions LEFT greater than RIGHT with  bibasilar pulmonary opacities that may represent atelectasis or consolidation.  Little interval change.   Electronically Signed   By:  Lavonia Dana M.D.   On: 07/11/2015 18:57   Ct Chest W Contrast  07/10/2015   CLINICAL DATA:  Restaging lung cancer. Initial diagnosis 2015. History of right lobectomy.  EXAM: CT CHEST, ABDOMEN, AND PELVIS WITH CONTRAST  TECHNIQUE: Multidetector CT imaging of the chest, abdomen and pelvis was performed following the standard protocol during bolus administration of intravenous contrast.  CONTRAST:  153m OMNIPAQUE IOHEXOL 300 MG/ML  SOLN  COMPARISON:  CT 05/01/2015 and PET-CT 06/30/2014  FINDINGS: CT CHEST FINDINGS  Chest wall: Stable small axillary lymph nodes but no mass or overt adenopathy. The thyroid gland appears Stable. Stable small nodules. No supraclavicular adenopathy. The bony thorax is intact. No obvious metastatic lesions involving the thoracic spine or sternum. No obvious rib lesions. Progressive metastatic disease involving the L1 vertebral body.  Mediastinum: The heart is mildly enlarged but stable. Stable tortuosity, ectasia and calcification of the thoracic aorta. Stable small scattered mediastinal lymph nodes. The esophagus is grossly normal.  Lungs/ pleura: Persistent loculated pleural fluid collection on the right side with density along the pleural surface likely due to previous talc pleurodesis. Persistent overlying long atelectasis and right basilar airspace consolidation. Stable bilobed masslike densities in the right middle lobe and right lower lobe on image number 42.  There is a new large left pleural effusion with areas of probable loculation. I do not see any obvious enhancing pleural nodules. A left-sided thoracentesis may be useful for therapeutic and diagnostic purposes and to exclude malignant left effusion.  CT ABDOMEN AND PELVIS FINDINGS  Hepatobiliary: Stable scattered hepatic cysts. No findings suspicious for hepatic metastatic disease. The portal  vein is patent.  Pancreas: Stable small cystic lesion in the pancreatic head. No inflammation or ductal dilatation.  Spleen: Normal size.  No focal lesions.  Adrenals/Urinary Tract: Bilateral nodular enlargement of both adrenal glands which could reflect nodular hyperplasia. No obvious mass/metastasis. The kidneys are unremarkable and stable.  Stomach/Bowel: The stomach wall is thickened but this is likely due to under distension. No mass or obstruction. The small bowel is grossly normal. No inflammation or obstruction. Enlarging Masses involving the ascending colon. These could be adenomas or possible metastasis.  Vascular/Lymphatic: Stable tortuosity, ectasia and calcification of the abdominal aorta. The branch vessels are patent. 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 The SMA may also be partially occluded/thrombosed. No mesenteric or retroperitoneal mass or adenopathy. Small scattered lymph nodes are noted.  Other: Progression of abdominal/ pelvic ascites without obvious omental or peritoneal surface implants. The bladder appears normal. No pelvic mass or adenopathy. No inguinal mass or adenopathy.  Musculoskeletal: No significant bony findings in the pelvis. Progressive L1 sclerotic metastatic disease.  IMPRESSION: 1. Persistent right-sided loculated pleural fluid collection and evidence of prior talc pleurodesis. No obvious enhancing pleural masses. Persistent right basilar airspace consolidation and stable bilobed masslike densities in the right middle lobe and right lower lobe. 2. New large left pleural effusion with areas of probable loculation. No obvious enhancing pleural nodules but there left-sided thoracentesis may be useful for diagnostic and therapeutic purposes. 3. Progressive abdominal/pelvic ascites. 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 4.  Stable nodular enlargement of both adrenal glands. 5. Enlarging soft tissue masses involving the ascending colon. These could be adenomas or metastasis. 6. Progressive sclerotic metastatic disease involving the L1 vertebral body. No definite new bone metastasis.   Electronically Signed   By: PRicky StabsD.  On: 07/10/2015 14:28   Ct Abdomen Pelvis W Contrast  07/10/2015   CLINICAL DATA:  Restaging lung cancer. Initial diagnosis 2015. History of right lobectomy.  EXAM: CT CHEST, ABDOMEN, AND PELVIS WITH CONTRAST  TECHNIQUE: Multidetector CT imaging of the chest, abdomen and pelvis was performed following the standard protocol during bolus administration of intravenous contrast.  CONTRAST:  146m OMNIPAQUE IOHEXOL 300 MG/ML  SOLN  COMPARISON:  CT 05/01/2015 and PET-CT 06/30/2014  FINDINGS: CT CHEST FINDINGS  Chest wall: Stable small axillary lymph nodes but no mass or overt adenopathy. The thyroid gland appears Stable. Stable small nodules. No supraclavicular adenopathy. The bony thorax is intact. No obvious metastatic lesions involving the thoracic spine or sternum. No obvious rib lesions. Progressive metastatic disease involving the L1 vertebral body.  Mediastinum: The heart is mildly enlarged but stable. Stable tortuosity, ectasia and calcification of the thoracic aorta. Stable small scattered mediastinal lymph nodes. The esophagus is grossly normal.  Lungs/ pleura: Persistent loculated pleural fluid collection on the right side with density along the pleural surface likely due to previous talc pleurodesis. Persistent overlying long atelectasis and right basilar airspace consolidation. Stable bilobed masslike densities in the right middle lobe and right lower lobe on image number 42.  There is a new large left pleural effusion with areas of probable loculation. I do not see any obvious enhancing pleural nodules. A left-sided thoracentesis may be useful for therapeutic and diagnostic purposes and to exclude  malignant left effusion.  CT ABDOMEN AND PELVIS FINDINGS  Hepatobiliary: Stable scattered hepatic cysts. No findings suspicious for hepatic metastatic disease. The portal vein is patent.  Pancreas: Stable small cystic lesion in the pancreatic head. No inflammation or ductal dilatation.  Spleen: Normal size.  No focal lesions.  Adrenals/Urinary Tract: Bilateral nodular enlargement of both adrenal glands which could reflect nodular hyperplasia. No obvious mass/metastasis. The kidneys are unremarkable and stable.  Stomach/Bowel: The stomach wall is thickened but this is likely due to under distension. No mass or obstruction. The small bowel is grossly normal. No inflammation or obstruction. Enlarging Masses involving the ascending colon. These could be adenomas or possible metastasis.  Vascular/Lymphatic: Stable tortuosity, ectasia and calcification of the abdominal aorta. The branch vessels are patent. 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 The SMA may also be partially occluded/thrombosed. No mesenteric or retroperitoneal mass or adenopathy. Small scattered lymph nodes are noted.  Other: Progression of abdominal/ pelvic ascites without obvious omental or peritoneal surface implants. The bladder appears normal. No pelvic mass or adenopathy. No inguinal mass or adenopathy.  Musculoskeletal: No significant bony findings in the pelvis. Progressive L1 sclerotic metastatic disease.  IMPRESSION: 1. Persistent right-sided loculated pleural fluid collection and evidence of prior talc pleurodesis. No obvious enhancing pleural masses. Persistent right basilar airspace consolidation and stable bilobed masslike densities in the right middle lobe and right lower lobe. 2. New large left pleural effusion with areas of probable loculation. No obvious enhancing pleural nodules but there left-sided thoracentesis may be useful for diagnostic and therapeutic purposes. 3. Progressive  abdominal/pelvic ascites. 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 4. Stable nodular enlargement of both adrenal glands. 5. Enlarging soft tissue masses involving the ascending colon. These could be adenomas or metastasis. 6. Progressive sclerotic metastatic disease involving the L1 vertebral body. No definite new bone metastasis.   Electronically Signed   By: PMarijo SanesM.D.   On: 07/10/2015 14:28  MDM  Patient likely needs pleurocentesis for left pleural effusion and will also treat with intravenous antibiotics for healthcare associated pneumonia, likely postobstructive . Spoke with Dr. Arnoldo Morale plan admit medical surgical floor Final diagnoses:  None   diagnosis #1 bilateral pleural effusions #2 postobstructive pneumonia(healthcare associated pneumonia) #3 tobacco abuse #4 hypokalemia      Orlie Dakin, MD 07/11/15 2103

## 2015-07-11 NOTE — Telephone Encounter (Signed)
-----   Message from Rica Records, RN sent at 07/10/2015 11:32 AM EDT ----- Regarding: scheduling Please cancel Mr. Teixeira's 3 month VV follow up with Dr. Donnetta Hutching on 10-02-2015. Please reschedule Mr. Hankerson for VV FU with Dr. Donnetta Hutching at next available appointment ( July 19, 2015 Dr. Donnetta Hutching has VV Fu opening, no lab needed).  Please call patient at [phone] with the new appointment information.  Mr. Kothari's primary care physician has sent in documentation that he has been wearing thigh high compression hose > 3 months so patient's appointment needs to be moved up.  Thanks!

## 2015-07-11 NOTE — Progress Notes (Signed)
ANTIBIOTIC CONSULT NOTE - INITIAL  Pharmacy Consult for Vancomycin/Zosyn Indication: HCAP  Allergies  Allergen Reactions  . Other Diarrhea    Red meats     Patient Measurements: Wt=71 kg  Vital Signs: Temp: 97.9 F (36.6 C) (08/24 2250) Temp Source: Oral (08/24 2250) BP: 167/92 mmHg (08/24 2250) Pulse Rate: 85 (08/24 2250) Intake/Output from previous day:   Intake/Output from this shift:    Labs:  Recent Labs  07/11/15 1912  WBC 9.5  HGB 13.4  PLT 388  CREATININE 0.72   Estimated Creatinine Clearance: 94.1 mL/min (by C-G formula based on Cr of 0.72). No results for input(s): VANCOTROUGH, VANCOPEAK, VANCORANDOM, GENTTROUGH, GENTPEAK, GENTRANDOM, TOBRATROUGH, TOBRAPEAK, TOBRARND, AMIKACINPEAK, AMIKACINTROU, AMIKACIN in the last 72 hours.   Microbiology: Recent Results (from the past 720 hour(s))  Blood culture (routine x 2)     Status: None (Preliminary result)   Collection Time: 07/11/15  8:55 PM  Result Value Ref Range Status   Specimen Description   Final    BLOOD RIGHT HAND Performed at [facility]    Special Requests BOTTLES DRAWN AEROBIC AND ANAEROBIC 5ML  Final   Culture PENDING  Incomplete   Report Status PENDING  Incomplete    Medical History: Past Medical History  Diagnosis Date  . Hypertension   . GERD (gastroesophageal reflux disease)   . Hemochromatosis   . Hx of adenomatous colonic polyps   . Umbilical hernia     none since weight loss  . Diverticulosis   . Lumbar spondylosis     ankle  . Pleural effusion on right 12/16/13    per chest xray  . Shortness of breath     with  exertion  . Chronic pancreatitis   . IBS (irritable bowel syndrome)   . Lung cancer 06/2014  . 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 acetaminophen-codeine (TYLENOL #3) 300-30 MG per tablet Take 1-2 tablets by mouth every 4 (four) hours as needed for moderate  pain.   07/11/2015 at 1300  . folic acid (FOLVITE) 1 MG tablet Take 1 tablet (1 mg total) by mouth daily. 30 tablet 3 07/10/2015 at Unknown time  . furosemide (LASIX) 40 MG tablet Take 40 mg by mouth every morning.   11 07/10/2015 at Unknown time  . guaiFENesin (MUCINEX) 600 MG 12 hr tablet Take 600 mg by mouth daily.   07/11/2015 at 1300  . ibuprofen (ADVIL,MOTRIN) 200 MG tablet Take 400 mg by mouth every 6 (six) hours as needed (Pain).   Past Month at Unknown time  . metoprolol succinate (TOPROL-XL) 50 MG 24 hr tablet Take 50 mg by mouth daily after lunch.   12 07/10/2015 at 1400  . mometasone-formoterol (DULERA) 200-5 MCG/ACT AERO Take 2 puffs first thing in am and then another 2 puffs about 12 hours later. 1 Inhaler 11 Past Week at Unknown time  . potassium chloride SA (K-DUR,KLOR-CON) 20 MEQ tablet Take 40 mEq by mouth daily.    07/10/2015 at Unknown time  . PROAIR HFA 108 (90 BASE) MCG/ACT inhaler Inhale 1 each into the lungs 2 (two) times daily.  3 07/11/2015 at Unknown time  . spironolactone (ALDACTONE) 25 MG tablet Take 25 mg by mouth daily after lunch.   07/10/2015 at Unknown time  . verapamil (VERELAN PM) 240 MG 24 hr capsule Take 240 mg by mouth daily.  11 07/10/2015 at Unknown time  . EPIPEN  2-PAK 0.3 MG/0.3ML SOAJ injection See admin instructions.  1 Taking  . oxyCODONE (OXY IR/ROXICODONE) 5 MG immediate release tablet Take 1 tablet (5 mg total) by mouth every 4 (four) hours as needed for moderate pain. 30 tablet 0 Taking  . Pancrelipase, Lip-Prot-Amyl, 20880 UNITS TABS Take 2 tablets by mouth 3 (three) times daily with meals. (Patient not taking: Reported on 07/11/2015) 540 tablet 2 Not Taking at Unknown time  . UNABLE TO FIND Med Name: Oxygen concentrator.  Per medical necessity pt needs portable oxygen concentrator via nasal cannula with activity. 1 each 1 Taking   Scheduled:  . [START ON 07/12/2015] piperacillin-tazobactam  3.375 g Intravenous 3 times per day  . piperacillin-tazobactam  (ZOSYN)  IV  3.375 g Intravenous Once  . potassium chloride SA  40 mEq Oral Once  . [START ON 07/12/2015] vancomycin  1,000 mg Intravenous Q12H  . [DISCONTINUED] piperacillin-tazobactam (ZOSYN)  IV  3.375 g Intravenous Once   Infusions:  . sodium chloride     Assessment: 60 yoM with hx of metastatic adenocarcioma of unknown primary c/o increase SOB and cough.  Vancomycin/zosyn per Rx for HCAP.  Goal of Therapy:  Vancomycin trough level 15-20 mcg/ml  Plan:   Vancomycin 1Gm IV q12h  Zosyn 3.375 q8h EI  F/u SCr/cultures/levels  Lawana Pai R 07/11/2015,11:23 PM

## 2015-07-12 ENCOUNTER — Inpatient Hospital Stay (HOSPITAL_COMMUNITY): Payer: BLUE CROSS/BLUE SHIELD

## 2015-07-12 ENCOUNTER — Encounter (HOSPITAL_COMMUNITY): Payer: Self-pay | Admitting: *Deleted

## 2015-07-12 ENCOUNTER — Ambulatory Visit: Payer: BLUE CROSS/BLUE SHIELD | Admitting: Physician Assistant

## 2015-07-12 ENCOUNTER — Other Ambulatory Visit: Payer: Self-pay | Admitting: Internal Medicine

## 2015-07-12 ENCOUNTER — Ambulatory Visit: Payer: BLUE CROSS/BLUE SHIELD

## 2015-07-12 ENCOUNTER — Telehealth: Payer: Self-pay | Admitting: Internal Medicine

## 2015-07-12 ENCOUNTER — Other Ambulatory Visit: Payer: BLUE CROSS/BLUE SHIELD

## 2015-07-12 DIAGNOSIS — J9611 Chronic respiratory failure with hypoxia: Secondary | ICD-10-CM

## 2015-07-12 DIAGNOSIS — R933 Abnormal findings on diagnostic imaging of other parts of digestive tract: Secondary | ICD-10-CM

## 2015-07-12 DIAGNOSIS — J910 Malignant pleural effusion: Secondary | ICD-10-CM

## 2015-07-12 DIAGNOSIS — J9610 Chronic respiratory failure, unspecified whether with hypoxia or hypercapnia: Secondary | ICD-10-CM | POA: Diagnosis present

## 2015-07-12 DIAGNOSIS — J189 Pneumonia, unspecified organism: Secondary | ICD-10-CM

## 2015-07-12 DIAGNOSIS — R188 Other ascites: Principal | ICD-10-CM

## 2015-07-12 DIAGNOSIS — R0600 Dyspnea, unspecified: Secondary | ICD-10-CM

## 2015-07-12 DIAGNOSIS — I10 Essential (primary) hypertension: Secondary | ICD-10-CM

## 2015-07-12 DIAGNOSIS — J948 Other specified pleural conditions: Secondary | ICD-10-CM

## 2015-07-12 LAB — EXPECTORATED SPUTUM ASSESSMENT W REFEX TO RESP CULTURE

## 2015-07-12 LAB — BASIC METABOLIC PANEL
Anion gap: 7 (ref 5–15)
BUN: 12 mg/dL (ref 6–20)
CALCIUM: 8.6 mg/dL — AB (ref 8.9–10.3)
CO2: 31 mmol/L (ref 22–32)
Chloride: 100 mmol/L — ABNORMAL LOW (ref 101–111)
Creatinine, Ser: 0.86 mg/dL (ref 0.61–1.24)
GFR calc Af Amer: >60 mL/min (ref >60)
GLUCOSE: 101 mg/dL — AB (ref 65–99)
Potassium: 3 mmol/L — ABNORMAL LOW (ref 3.5–5.1)
SODIUM: 138 mmol/L (ref 135–145)

## 2015-07-12 LAB — CBC
HCT: 36.6 % — ABNORMAL LOW (ref 39.0–52.0)
Hemoglobin: 12.8 g/dL — ABNORMAL LOW (ref 13.0–17.0)
MCH: 35.7 pg — ABNORMAL HIGH (ref 26.0–34.0)
MCHC: 35 g/dL (ref 30.0–36.0)
MCV: 101.9 fL — ABNORMAL HIGH (ref 78.0–100.0)
PLATELETS: 388 10*3/uL (ref 150–400)
RBC: 3.59 MIL/uL — ABNORMAL LOW (ref 4.22–5.81)
RDW: 15.4 % (ref 11.5–15.5)
WBC: 9.2 10*3/uL (ref 4.0–10.5)

## 2015-07-12 LAB — BODY FLUID CELL COUNT WITH DIFFERENTIAL
EOS FL: 12 %
LYMPHS FL: 40 %
MONOCYTE-MACROPHAGE-SEROUS FLUID: 23 % — AB (ref 50–90)
NEUTROPHIL FLUID: 25 % (ref 0–25)
WBC FLUID: 513 uL (ref 0–1000)

## 2015-07-12 LAB — LACTATE DEHYDROGENASE, PLEURAL OR PERITONEAL FLUID: LD FL: 672 U/L — AB (ref 3–23)

## 2015-07-12 LAB — GLUCOSE, SEROUS FLUID: Glucose, Fluid: 62 mg/dL

## 2015-07-12 LAB — GRAM STAIN

## 2015-07-12 LAB — EXPECTORATED SPUTUM ASSESSMENT W GRAM STAIN, RFLX TO RESP C: Special Requests: NORMAL

## 2015-07-12 LAB — PROTEIN, BODY FLUID: Total protein, fluid: 3.6 g/dL

## 2015-07-12 LAB — STREP PNEUMONIAE URINARY ANTIGEN: STREP PNEUMO URINARY ANTIGEN: NEGATIVE

## 2015-07-12 MED ORDER — SALINE SPRAY 0.65 % NA SOLN
1.0000 | NASAL | Status: DC | PRN
  Filled 2015-07-12: qty 44

## 2015-07-12 MED ORDER — POTASSIUM CHLORIDE 20 MEQ/15ML (10%) PO SOLN: 40.0000 meq | Freq: Once | ORAL | Status: DC

## 2015-07-12 MED ORDER — POTASSIUM CHLORIDE 20 MEQ/15ML (10%) PO SOLN
40.0000 meq | ORAL | Status: AC
  Administered 2015-07-12: 40 meq via ORAL
  Filled 2015-07-12 (×2): qty 30

## 2015-07-12 NOTE — Telephone Encounter (Signed)
Spoke with Henry [name], states thoracentesis was done inpatient this morning and Cheryl had fluid sent to lab for testing.  Did not specify which labs were being ran.  Forwarding to MW at her request to make aware.  Nothing further needed at this time.

## 2015-07-12 NOTE — Plan of Care (Signed)
Problem: Phase I Progression Outcomes Goal: Progress activity as tolerated unless otherwise ordered Outcome: Completed/Met Date Met:  07/12/15 Up with supervision in room

## 2015-07-12 NOTE — Progress Notes (Signed)
DIAGNOSIS: Metastatic adenocarcinoma of unknown primary but questionable for upper gastrointestinal, pancreaticobiliary or primary lung cancer, diagnosed in August of 2015.  Genomic Alterations Identified? FGFR2 A97T KRAS Q61H - subclonal? ARID1A [id number]* - subclonal?  PRIOR THERAPY:  1) Status post right video-assisted thoracoscopy, drainage of pleural effusion, pleural biopsy, partial decortication, talc pleurodesis.under the care of Dr. Roxan Hockey on 06/23/2014. 2) Systemic chemotherapy with carboplatin for AUC of 5 and Alimta 500 mg/M2 every 3 weeks. He is status post 6 cycles, with stable disease after cycle #6.  CURRENT THERAPY: Maintenance chemotherapy with single agent Alimta 500 MG/M2 every 3 weeks. First dose 12/20/2014. He is status post 9 cycles.  Subjective: The patient is seen and examined today. He was admitted yesterday to [facility] complaining of increasing shortness of breath as well as cough with occasional hemoptysis. 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 He also has enlarging soft tissue masses involving the ascending colon questionable for adenomas or metastasis in addition to progressive sclerotic metastasis involving the L1 vertebral body. The patient underwent ultrasound-guided left thoracentesis earlier today with drainage of 1 L of turbid dark bloody fluid. He was also started on Zosyn and vancomycin for questionable infection. He is feeling a little bit better today. He denied having any fever or chills. His shortness of breath is slightly better after the thoracentesis. He still has abdominal distention. He denied having any nausea or vomiting.  Objective: Vital signs in last 24  hours: Temp:  [97.3 F (36.3 C)-98.1 F (36.7 C)] 97.3 F (36.3 C) (08/25 1350) Pulse Rate:  [82-97] 88 (08/25 1350) Resp:  [18-25] 18 (08/25 1350) BP: (145-167)/(90-103) 158/100 mmHg (08/25 1350) SpO2:  [93 %-100 %] 99 % (08/25 1350) Weight:  [154 lb 1.6 oz (69.899 kg)] 154 lb 1.6 oz (69.899 kg) (08/24 2250)  Intake/Output from previous day: 08/24 0701 - 08/25 0700 In: 1091 [P.O.:480; I.V.:561; IV Piggyback:50] Out: -  Intake/Output this shift: Total I/O In: 490 [P.O.:490] Out: -   General appearance: alert, cooperative, fatigued and no distress Resp: diminished breath sounds bilaterally and dullness to percussion bilaterally Cardio: regular rate and rhythm, S1, S2 normal, no murmur, click, rub or gallop GI: Abdominal distention but nontender to palpation Extremities: extremities normal, atraumatic, no cyanosis or edema  Lab Results:   Recent Labs  07/11/15 1912 07/12/15 0405  WBC 9.5 9.2  HGB 13.4 12.8*  HCT 37.9* 36.6*  PLT 388 388   BMET  Recent Labs  07/11/15 1912 07/12/15 0405  NA 137 138  K 3.1* 3.0*  CL 99* 100*  CO2 31 31  GLUCOSE 97 101*  BUN 12 12  CREATININE 0.72 0.86  CALCIUM 8.7* 8.6*    Studies/Results: Dg Chest 1 View  07/12/2015   CLINICAL DATA:  Status post left-sided thoracentesis.  EXAM: CHEST  1 VIEW  COMPARISON:  June 28, 2014.  FINDINGS: Stable cardiomediastinal silhouette. No pneumothorax is noted. Minimal right pleural effusion is noted which is increased compared to prior exam. Right basilar opacity is noted which is stable and consistent with scarring or atelectasis. Mild left pleural effusion is noted in left lung base. Increased left upper lobe opacity is noted concerning for possible pneumonia or atelectasis. Mild central pulmonary vascular congestion is noted.  IMPRESSION: Mild central pulmonary  vascular congestion. Minimal right pleural effusion is noted. Increased left upper lobe opacity is noted concerning for pneumonia or  possibly atelectasis. Mild left pleural effusion is noted in left lung base status post thoracentesis. No definite pneumothorax is noted.   Electronically Signed   By: Marijo Conception, M.D.   On: 07/12/2015 10:59   Dg Chest 2 View  07/11/2015   CLINICAL DATA:  Lung cancer on chemotherapy, increased shortness of breath for 1 week, fluid on lungs, productive cough for 3 months, hypertension  EXAM: CHEST  2 VIEW  COMPARISON:  CT chest 07/10/2015  FINDINGS: Enlargement of cardiac silhouette.  Tortuous aorta.  Mediastinal contours and pulmonary vascularity normal.  Bibasilar effusions LEFT greater than RIGHT similar to prior CT.  Bibasilar pulmonary opacities may represent infiltrate or atelectasis, little changed from prior CT.  Upper lungs grossly clear.  No pneumothorax.  Bones demineralized.  IMPRESSION: Bibasilar pleural effusions LEFT greater than RIGHT with bibasilar pulmonary opacities that may represent atelectasis or consolidation.  Little interval change.   Electronically Signed   By: Lavonia Dana M.D.   On: 07/11/2015 18:57   US Thoracentesis Asp Pleural Space W/img Guide  07/12/2015   INDICATION: Patient with history of lung and thyroid cancers, dyspnea, left pleural effusion. Request is made for diagnostic and therapeutic left thoracentesis.  EXAM: ULTRASOUND GUIDED DIAGNOSTIC AND THERAPEUTIC LEFT THORACENTESIS  COMPARISON:  None.  MEDICATIONS: None  COMPLICATIONS: None immediate  TECHNIQUE: Informed written consent was obtained from the patient after a discussion of the risks, benefits and alternatives to treatment. A timeout was performed prior to the initiation of the procedure.  Initial ultrasound scanning demonstrates a moderate to large left pleural effusion. The lower chest was prepped and draped in the usual sterile fashion. 1% lidocaine was used for local anesthesia.  An ultrasound image was saved for documentation purposes. A 6 Fr Safe-T-Centesis catheter was introduced. The thoracentesis was  performed. The catheter was removed and a dressing was applied. The patient tolerated the procedure well without immediate post procedural complication. The patient was escorted to have an upright chest radiograph.  FINDINGS: A total of approximately 1 liter of turbid, dark bloody fluid was removed. Requested samples were sent to the laboratory.  IMPRESSION: Successful ultrasound-guided diagnostic/therapeutic left sided thoracentesis yielding 1 liter of pleural fluid.  Read by: Rowe Robert, PA-C   Electronically Signed   By: Aletta Edouard M.D.   On: 07/12/2015 11:05    Medications: I have reviewed the patient's current medications.  Assessment/Plan: 1) stage IV non-small cell lung cancer, adenocarcinoma status post induction systemic chemotherapy with carboplatin and Alimta and currently undergoing maintenance treatment with single agent Alimta status post 9 cycles. The recent CT scan of the chest, abdomen and pelvis showed no clear evidence for disease progression except for the enlarging left pleural effusion. I would recommend for the patient to resume her systemic chemotherapy on outpatient basis after improvement of his condition. I rescheduled the next cycle of his treatment to be done in 2 weeks to give him more time to recover from the recent admission. The patient is not interested in palliative care at this point. 2) abdominal distention and ascites seen on the recent CT scan of the abdomen and pelvis: I would recommend ultrasound guided paracentesis and sending the fluid for cytologic evaluation. 3) ascending colon lesions, hypermetabolic on previous PET scan: The patient was evaluated by Dr. Paulita Fujita. He was expected to have colonoscopy performed in 2 weeks. He may benefit  from having this procedure performed during his hospitalization to avoid any further delay. Please consulted Dr. Paulita Fujita for evaluation. 4) dyspnea: Improved after the left-sided thoracentesis.  5) follow-up visit: I will  arrange for the patient to come back for follow-up visit in 2 weeks for reevaluation before resuming his systemic therapy. Thank you so much for taking good care of Mr. Mcelwain, I will continue to follow up the patient with you and assist in his management on as-needed basis. I will be out of town the next few days. Please contact the oncologist on call if you have any questions.   LOS: 1 day    Leimomi Zervas K. 07/12/2015

## 2015-07-12 NOTE — Progress Notes (Signed)
Home meds delivered to pharmacy.

## 2015-07-12 NOTE — Telephone Encounter (Signed)
Pt is currently admitted to [facility] for pleural effusion.  Nothing further needed at this time.

## 2015-07-12 NOTE — Progress Notes (Signed)
Changed thoracentesis order to US guided. Patient does not have to be NPO for procedure.

## 2015-07-12 NOTE — Progress Notes (Signed)
PROGRESS NOTE  Henry [name] WUJ:[id number] DOB: [birth year] DOA: 07/11/2015 PCP: Donnie Coffin, MD  HPI/Recap of past 24 hours: Patient is a 64 year old male with past history of metastatic adenocarcinoma of unknown primary to the lungs along with chronic respiratory failure on 2 L nasal cannula who in the last month underwent a right pleurodesis for recurrent pleural effusion and was admitted on 8/24 for progressively worsening shortness of breath and found to have a large left pleural effusion. Patient underwent a thoracentesis yielding 1 L of fluid. Results pending.  Today, patient states he feels really about the same. Breathing is not too much better. Gets easily winded.  Assessment/Plan: Principal Problem:   Pleural effusion, left: Stable. No complications from thoracentesis. Awaiting cytology. If positive for malignancy, patient will need to be referred to cardiothoracic surgery for VATS on left side Active Problems:   HTN (hypertension): Monitoring blood pressures   Hemochromatosis   Lung cancer   Cigarette smoker   HCAP (healthcare-associated pneumonia): CT on 8/23 confirms pneumonia, continue antibiotics   Metastatic adenocarcinoma of unknown origin   Chronic respiratory failure with hypoxia: Currently at baseline of 2 L nasal cannula   Code Status: Full code  Family Communication: No family present  Disposition Plan: Discharge of cytology negative   Consultants:  Case discussed with pulmonary  Interventional radiology  Procedures:  Status post 1 L of fluid from thoracentesis done 8/24  Antibiotics:  IV vancomycin 8/24-present  IV Zosyn 8/24-present   Objective: BP 158/100 mmHg  Pulse 88  Temp(Src) 97.3 F (36.3 C) (Oral)  Resp 18  Ht 6' (1.829 m)  Wt 69.899 kg (154 lb 1.6 oz)  BMI 20.90 kg/m2  SpO2 99%  Intake/Output Summary (Last 24 hours) at 07/12/15 1647 Last data filed at 07/12/15 1350  Gross per 24 hour  Intake   1581 ml  Output      0 ml   Net   1581 ml   Filed Weights   07/11/15 2250  Weight: 69.899 kg (154 lb 1.6 oz)    Exam:   General:  Alert and oriented 3, withdrawn  Cardiovascular: Regular rate and rhythm, S1-S2  Respiratory: Decreased breath sounds throughout  Abdomen: Soft, nontender, nondistended, positive bowel sounds  Musculoskeletal: No clubbing or cyanosis or edema   Data Reviewed: Basic Metabolic Panel:  Recent Labs Lab 07/11/15 1912 07/12/15 0405  NA 137 138  K 3.1* 3.0*  CL 99* 100*  CO2 31 31  GLUCOSE 97 101*  BUN 12 12  CREATININE 0.72 0.86  CALCIUM 8.7* 8.6*  MG 2.0  --    Liver Function Tests:  Recent Labs Lab 07/11/15 1912  AST 18  ALT 10*  ALKPHOS 72  BILITOT 1.1  PROT 6.7  ALBUMIN 3.3*   No results for input(s): LIPASE, AMYLASE in the last 168 hours. No results for input(s): AMMONIA in the last 168 hours. CBC:  Recent Labs Lab 07/11/15 1912 07/12/15 0405  WBC 9.5 9.2  NEUTROABS 6.7  --   HGB 13.4 12.8*  HCT 37.9* 36.6*  MCV 100.5* 101.9*  PLT 388 388   Cardiac Enzymes:   No results for input(s): CKTOTAL, CKMB, CKMBINDEX, TROPONINI in the last 168 hours. BNP (last 3 results) No results for input(s): BNP in the last 8760 hours.  ProBNP (last 3 results) No results for input(s): PROBNP in the last 8760 hours.  CBG: No results for input(s): GLUCAP in the last 168 hours.  Recent Results (from the past 240 hour(s))  Blood culture (routine x 2)     Status: None (Preliminary result)   Collection Time: 07/11/15  8:55 PM  Result Value Ref Range Status   Specimen Description BLOOD RIGHT ANTECUBITAL  Final   Special Requests BOTTLES DRAWN AEROBIC AND ANAEROBIC 5ML  Final   Culture   Final    NO GROWTH < 24 HOURS Performed at [facility]    Report Status PENDING  Incomplete  Blood culture (routine x 2)     Status: None (Preliminary result)   Collection Time: 07/11/15  8:55 PM  Result Value Ref Range Status   Specimen Description BLOOD RIGHT  HAND  Final   Special Requests BOTTLES DRAWN AEROBIC AND ANAEROBIC 5ML  Final   Culture   Final    NO GROWTH < 24 HOURS Performed at [facility]    Report Status PENDING  Incomplete  Culture, expectorated sputum-assessment     Status: None   Collection Time: 07/12/15  9:30 AM  Result Value Ref Range Status   Specimen Description SPUTUM  Final   Special Requests Normal  Final   Sputum evaluation   Final    THIS SPECIMEN IS ACCEPTABLE. RESPIRATORY CULTURE REPORT TO FOLLOW.   Report Status 07/12/2015 FINAL  Final  Culture, body fluid-bottle     Status: None (Preliminary result)   Collection Time: 07/12/15 10:22 AM  Result Value Ref Range Status   Specimen Description FLUID LEFT PLEURAL  Final   Special Requests BAA 10CCS  Final   Culture PENDING  Incomplete   Report Status PENDING  Incomplete  Gram stain     Status: None   Collection Time: 07/12/15 10:22 AM  Result Value Ref Range Status   Specimen Description FLUID LEFT PLEURAL  Final   Special Requests NONE  Final   Gram Stain   Final    MODERATE WBC PRESENT,BOTH PMN AND MONONUCLEAR NO ORGANISMS SEEN    Report Status 07/12/2015 FINAL  Final     Studies: Dg Chest 1 View  07/12/2015   CLINICAL DATA:  Status post left-sided thoracentesis.  EXAM: CHEST  1 VIEW  COMPARISON:  June 28, 2014.  FINDINGS: Stable cardiomediastinal silhouette. No pneumothorax is noted. Minimal right pleural effusion is noted which is increased compared to prior exam. Right basilar opacity is noted which is stable and consistent with scarring or atelectasis. Mild left pleural effusion is noted in left lung base. Increased left upper lobe opacity is noted concerning for possible pneumonia or atelectasis. Mild central pulmonary vascular congestion is noted.  IMPRESSION: Mild central pulmonary vascular congestion. Minimal right pleural effusion is noted. Increased left upper lobe opacity is noted concerning for pneumonia or possibly atelectasis. Mild  left pleural effusion is noted in left lung base status post thoracentesis. No definite pneumothorax is noted.   Electronically Signed   By: Marijo Conception, M.D.   On: 07/12/2015 10:59   Dg Chest 2 View  07/11/2015   CLINICAL DATA:  Lung cancer on chemotherapy, increased shortness of breath for 1 week, fluid on lungs, productive cough for 3 months, hypertension  EXAM: CHEST  2 VIEW  COMPARISON:  CT chest 07/10/2015  FINDINGS: Enlargement of cardiac silhouette.  Tortuous aorta.  Mediastinal contours and pulmonary vascularity normal.  Bibasilar effusions LEFT greater than RIGHT similar to prior CT.  Bibasilar pulmonary opacities may represent infiltrate or atelectasis, little changed from prior CT.  Upper lungs grossly clear.  No pneumothorax.  Bones demineralized.  IMPRESSION: Bibasilar pleural effusions  LEFT greater than RIGHT with bibasilar pulmonary opacities that may represent atelectasis or consolidation.  Little interval change.   Electronically Signed   By: Lavonia Dana M.D.   On: 07/11/2015 18:57    Scheduled Meds: . piperacillin-tazobactam  3.375 g Intravenous 3 times per day  . potassium chloride SA  40 mEq Oral Once  . vancomycin  1,000 mg Intravenous Q12H    Continuous Infusions:    Time spent: 25 minutes  [facility] Pager [phone]. If 7PM-7AM, please contact night-coverage at [email or web address], password [facility] 07/12/2015, 4:47 PM  LOS: 1 day

## 2015-07-12 NOTE — Procedures (Signed)
US guided diagnostic/therapeutic left thoracentesis performed yielding 1 liter turbid,  dark bloody fluid. The fluid was sent to the lab for preordered studies. F/u CXR pending. No immediate complications.

## 2015-07-13 ENCOUNTER — Ambulatory Visit (HOSPITAL_COMMUNITY): Payer: BLUE CROSS/BLUE SHIELD

## 2015-07-13 ENCOUNTER — Telehealth: Payer: Self-pay | Admitting: Internal Medicine

## 2015-07-13 ENCOUNTER — Inpatient Hospital Stay (HOSPITAL_COMMUNITY): Payer: BLUE CROSS/BLUE SHIELD

## 2015-07-13 DIAGNOSIS — C801 Malignant (primary) neoplasm, unspecified: Secondary | ICD-10-CM

## 2015-07-13 LAB — BODY FLUID CELL COUNT WITH DIFFERENTIAL
EOS FL: 13 %
Lymphs, Fluid: 11 %
Monocyte-Macrophage-Serous Fluid: 73 % (ref 50–90)
NEUTROPHIL FLUID: 3 % (ref 0–25)
WBC FLUID: 1557 uL — AB (ref 0–1000)

## 2015-07-13 LAB — PROTIME-INR
INR: 1.15 (ref 0.00–1.49)
Prothrombin Time: 14.9 seconds (ref 11.6–15.2)

## 2015-07-13 LAB — VANCOMYCIN, TROUGH: Vancomycin Tr: 16 ug/mL (ref 10.0–20.0)

## 2015-07-13 MED ORDER — POTASSIUM CHLORIDE ER 10 MEQ PO TBCR
40.0000 meq | EXTENDED_RELEASE_TABLET | Freq: Once | ORAL | Status: AC
  Administered 2015-07-13: 40 meq via ORAL
  Filled 2015-07-13: qty 4

## 2015-07-13 MED ORDER — FUROSEMIDE 40 MG PO TABS
40.0000 mg | ORAL_TABLET | Freq: Every morning | ORAL | Status: DC
  Administered 2015-07-13 – 2015-07-14 (×2): 40 mg via ORAL
  Filled 2015-07-13 (×2): qty 1

## 2015-07-13 MED ORDER — IPRATROPIUM-ALBUTEROL 0.5-2.5 (3) MG/3ML IN SOLN
3.0000 mL | Freq: Three times a day (TID) | RESPIRATORY_TRACT | Status: DC
  Administered 2015-07-13 – 2015-07-15 (×8): 3 mL via RESPIRATORY_TRACT
  Filled 2015-07-13 (×8): qty 3

## 2015-07-13 MED ORDER — SPIRONOLACTONE 25 MG PO TABS
25.0000 mg | ORAL_TABLET | Freq: Every day | ORAL | Status: DC
  Administered 2015-07-13 – 2015-07-15 (×3): 25 mg via ORAL
  Filled 2015-07-13 (×3): qty 1

## 2015-07-13 MED ORDER — DM-GUAIFENESIN ER 30-600 MG PO TB12
1.0000 | ORAL_TABLET | Freq: Two times a day (BID) | ORAL | Status: DC
Start: 2015-07-13 — End: 2015-07-15
  Administered 2015-07-13 – 2015-07-15 (×6): 1 via ORAL
  Filled 2015-07-13 (×10): qty 1

## 2015-07-13 MED ORDER — METOPROLOL SUCCINATE ER 50 MG PO TB24
50.0000 mg | ORAL_TABLET | Freq: Every day | ORAL | Status: DC
  Administered 2015-07-13 – 2015-07-15 (×3): 50 mg via ORAL
  Filled 2015-07-13 (×3): qty 1

## 2015-07-13 MED ORDER — FOLIC ACID 1 MG PO TABS
1.0000 mg | ORAL_TABLET | Freq: Every day | ORAL | Status: DC
  Administered 2015-07-13 – 2015-07-15 (×3): 1 mg via ORAL
  Filled 2015-07-13 (×3): qty 1

## 2015-07-13 NOTE — Progress Notes (Signed)
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 Patient underwent a thoracentesis yielding 1 L of fluid. Results pending.  Patient continues to feel about the same, no real improvement in his overall breathing. He was seen by oncology who after reviewing previous CT recommended paracentesis which patient underwent on 8/26 yielding 600 cc of fluid.  Assessment/Plan: Principal Problem:   Pleural effusion, left: Stable. No complications from thoracentesis. Awaiting cytology. If positive for malignancy, patient will need to be referred to cardiothoracic surgery for VATS on left side Active Problems:   HTN (hypertension): Blood pressure trending back up, have restarted some of patient's home medications   Hemochromatosis   Lung cancer   Cigarette smoker   HCAP (healthcare-associated pneumonia): CT on 8/23 confirms pneumonia, continue antibiotics   Metastatic adenocarcinoma of unknown origin   Chronic respiratory failure with hypoxia: Currently at baseline of 2 L nasal cannula Ascites: Status post paracentesis. Awaiting cytology  Code Status: Full code  Family Communication: No family present  Disposition Plan: Awaiting fluid studies from left pleural effusion and ascites   Consultants:  Case discussed with pulmonary  Interventional radiology  Procedures:  Status post 1 L of fluid from thoracentesis done 8/24   status post 600 ml of fluid from paracentesis done 8/26  Antibiotics:  IV vancomycin 8/24-present  IV Zosyn  8/24-present   Objective: BP 158/95 mmHg  Pulse 94  Temp(Src) 98.7 F (37.1 C) (Oral)  Resp 18  Ht 6' (1.829 m)  Wt 69.899 kg (154 lb 1.6 oz)  BMI 20.90 kg/m2  SpO2 100%  Intake/Output Summary (Last 24 hours) at 07/13/15 1436 Last data filed at 07/13/15 0540  Gross per 24 hour  Intake   2071 ml  Output    227 ml  Net   1844 ml   Filed Weights   07/11/15 2250  Weight: 69.899 kg (154 lb 1.6 oz)    Exam: Little change from previous day   General:  Alert and oriented 3, withdrawn  Cardiovascular: Regular rate and rhythm, S1-S2  Respiratory: Decreased breath sounds throughout  Abdomen: Soft, nontender, nondistended, positive bowel sounds  Musculoskeletal: No clubbing or cyanosis or edema   Data Reviewed: Basic Metabolic Panel:  Recent Labs Lab 07/11/15 1912 07/12/15 0405  NA 137 138  K 3.1* 3.0*  CL 99* 100*  CO2 31 31  GLUCOSE 97 101*  BUN 12 12  CREATININE 0.72 0.86  CALCIUM 8.7* 8.6*  MG 2.0  --    Liver Function Tests:  Recent Labs Lab 07/11/15 1912  AST 18  ALT 10*  ALKPHOS 72  BILITOT 1.1  PROT 6.7  ALBUMIN 3.3*   No results for input(s): LIPASE, AMYLASE in the last 168 hours. No results for input(s): AMMONIA in the last 168 hours. CBC:  Recent Labs Lab 07/11/15 1912 07/12/15 0405  WBC 9.5 9.2  NEUTROABS 6.7  --   HGB 13.4 12.8*  HCT 37.9* 36.6*  MCV 100.5* 101.9*  PLT 388 388   Cardiac Enzymes:   No results for input(s): CKTOTAL, CKMB, CKMBINDEX, TROPONINI in the  last 168 hours. BNP (last 3 results) No results for input(s): BNP in the last 8760 hours.  ProBNP (last 3 results) No results for input(s): PROBNP in the last 8760 hours.  CBG: No results for input(s): GLUCAP in the last 168 hours.  Recent Results (from the past 240 hour(s))  Blood culture (routine x 2)     Status: None (Preliminary result)   Collection Time: 07/11/15  8:55 PM  Result Value Ref Range Status   Specimen Description BLOOD RIGHT ANTECUBITAL   Final   Special Requests BOTTLES DRAWN AEROBIC AND ANAEROBIC 5ML  Final   Culture   Final    NO GROWTH < 24 HOURS Performed at [facility]    Report Status PENDING  Incomplete  Blood culture (routine x 2)     Status: None (Preliminary result)   Collection Time: 07/11/15  8:55 PM  Result Value Ref Range Status   Specimen Description BLOOD RIGHT HAND  Final   Special Requests BOTTLES DRAWN AEROBIC AND ANAEROBIC 5ML  Final   Culture   Final    NO GROWTH < 24 HOURS Performed at [facility]    Report Status PENDING  Incomplete  Culture, expectorated sputum-assessment     Status: None   Collection Time: 07/12/15  9:30 AM  Result Value Ref Range Status   Specimen Description SPUTUM  Final   Special Requests Normal  Final   Sputum evaluation   Final    THIS SPECIMEN IS ACCEPTABLE. RESPIRATORY CULTURE REPORT TO FOLLOW.   Report Status 07/12/2015 FINAL  Final  Culture, respiratory (NON-Expectorated)     Status: None (Preliminary result)   Collection Time: 07/12/15  9:30 AM  Result Value Ref Range Status   Specimen Description SPUTUM  Final   Special Requests NONE  Final   Gram Stain   Final    RARE WBC PRESENT,BOTH PMN AND MONONUCLEAR RARE SQUAMOUS EPITHELIAL CELLS PRESENT NO ORGANISMS SEEN Performed at [facility]    Culture   Final    Culture reincubated for better growth Performed at [facility]    Report Status PENDING  Incomplete  Culture, body fluid-bottle     Status: None (Preliminary result)   Collection Time: 07/12/15 10:22 AM  Result Value Ref Range Status   Specimen Description FLUID LEFT PLEURAL  Final   Special Requests BAA 10CCS  Final   Culture PENDING  Incomplete   Report Status PENDING  Incomplete  Gram stain     Status: None   Collection Time: 07/12/15 10:22 AM  Result Value Ref Range Status   Specimen Description FLUID LEFT PLEURAL  Final   Special Requests NONE  Final   Gram Stain   Final    MODERATE WBC PRESENT,BOTH  PMN AND MONONUCLEAR NO ORGANISMS SEEN    Report Status 07/12/2015 FINAL  Final     Studies: No results found.  Scheduled Meds: . dextromethorphan-guaiFENesin  1 tablet Oral BID  . ipratropium-albuterol  3 mL Nebulization TID  . piperacillin-tazobactam  3.375 g Intravenous 3 times per day  . potassium chloride SA  40 mEq Oral Once  . vancomycin  1,000 mg Intravenous Q12H    Continuous Infusions:    Time spent: 15 minutes  [place] [facility] Pager [phone]. If 7PM-7AM, please contact night-coverage at [email or web address], password [facility] 07/13/2015, 2:36 PM  LOS: 2 days

## 2015-07-13 NOTE — Telephone Encounter (Signed)
s.w. pt and advised on Sept appts.Marland KitchenMarland KitchenMarland KitchenMarland Kitchenpt ok and aware

## 2015-07-13 NOTE — Procedures (Signed)
Successful US guided paracentesis from RUQ.  Yielded 600 ml of serous fluid.  No immediate complications.  Pt tolerated well.   Specimen was sent for labs.  Tsosie Billing D PA-C 07/13/2015 11:52 AM

## 2015-07-13 NOTE — Progress Notes (Signed)
ANTIBIOTIC CONSULT NOTE - follow up  Pharmacy Consult for Vancomycin Indication: HCAP  Recent Labs  07/13/15 2052  [place] 16    Assessment: See pharmacist note from earlier today for further detail.  Vancomycin trough drawn prior to 5th dose is therapeutic with 1g q12h dosing.    Goal of Therapy:  Vancomycin trough level 15-20 mcg/ml  Plan:  Continue Vancomycin 1Gm IV q12h  Hershal Coria, PharmD, BCPS Pager: [phone] 07/13/2015 9:29 PM

## 2015-07-13 NOTE — Progress Notes (Signed)
ANTIBIOTIC CONSULT NOTE - follow up  Pharmacy Consult for Vancomycin/Zosyn Indication: HCAP  Allergies  Allergen Reactions  . Other Diarrhea    Red meats     Patient Measurements: Wt=71 kg  Vital Signs: Temp: 98.5 F (36.9 C) (08/26 0620) Temp Source: Oral (08/26 0620) BP: 146/91 mmHg (08/26 0620) Pulse Rate: 91 (08/26 0620) Intake/Output from previous day: 08/25 0701 - 08/26 0700 In: 2561 [P.O.:1507; IV Piggyback:300] Out: 227 [Urine:226; Stool:1] Intake/Output from this shift:    Labs:  Recent Labs  07/11/15 1912 07/12/15 0405  WBC 9.5 9.2  HGB 13.4 12.8*  PLT 388 388  CREATININE 0.72 0.86   Estimated Creatinine Clearance: 85.8 mL/min (by C-G formula based on Cr of 0.86). No results for input(s): VANCOTROUGH, VANCOPEAK, VANCORANDOM, GENTTROUGH, GENTPEAK, GENTRANDOM, TOBRATROUGH, TOBRAPEAK, TOBRARND, AMIKACINPEAK, AMIKACINTROU, AMIKACIN in the last 72 hours.   Microbiology: Recent Results (from the past 720 hour(s))  Blood culture (routine x 2)     Status: None (Preliminary result)   Collection Time: 07/11/15  8:55 PM  Result Value Ref Range Status   Specimen Description BLOOD RIGHT ANTECUBITAL  Final   Special Requests BOTTLES DRAWN AEROBIC AND ANAEROBIC 5ML  Final   Culture   Final    NO GROWTH < 24 HOURS Performed at [facility]    Report Status PENDING  Incomplete  Blood culture (routine x 2)     Status: None (Preliminary result)   Collection Time: 07/11/15  8:55 PM  Result Value Ref Range Status   Specimen Description BLOOD RIGHT HAND  Final   Special Requests BOTTLES DRAWN AEROBIC AND ANAEROBIC 5ML  Final   Culture   Final    NO GROWTH < 24 HOURS Performed at [facility]    Report Status PENDING  Incomplete  Culture, expectorated sputum-assessment     Status: None   Collection Time: 07/12/15  9:30 AM  Result Value Ref Range Status   Specimen Description SPUTUM  Final   Special Requests Normal  Final   Sputum evaluation   Final     THIS SPECIMEN IS ACCEPTABLE. RESPIRATORY CULTURE REPORT TO FOLLOW.   Report Status 07/12/2015 FINAL  Final  Culture, body fluid-bottle     Status: None (Preliminary result)   Collection Time: 07/12/15 10:22 AM  Result Value Ref Range Status   Specimen Description FLUID LEFT PLEURAL  Final   Special Requests BAA 10CCS  Final   Culture PENDING  Incomplete   Report Status PENDING  Incomplete  Gram stain     Status: None   Collection Time: 07/12/15 10:22 AM  Result Value Ref Range Status   Specimen Description FLUID LEFT PLEURAL  Final   Special Requests NONE  Final   Gram Stain   Final    MODERATE WBC PRESENT,BOTH PMN AND MONONUCLEAR NO ORGANISMS SEEN    Report Status 07/12/2015 FINAL  Final    Medical History: Past Medical History  Diagnosis Date  . Hypertension   . GERD (gastroesophageal reflux disease)   . Hemochromatosis   . Hx of adenomatous colonic polyps   . Umbilical hernia     none since weight loss  . Diverticulosis   . Lumbar spondylosis     ankle  . Pleural effusion on right 12/16/13    per chest xray  . Shortness of breath     with  exertion  . Chronic pancreatitis   . IBS (irritable bowel syndrome)   . Lung cancer 06/2014  . 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 Pleural effusion 07-11-15    Medications:  Prescriptions prior to admission  Medication Sig Dispense Refill Last Dose  . acetaminophen-codeine (TYLENOL #3) 300-30 MG per tablet Take 1-2 tablets by mouth every 4 (four) hours as needed for moderate pain.   07/11/2015 at 1300  . folic acid (FOLVITE) 1 MG tablet Take 1 tablet (1 mg total) by mouth daily. 30 tablet 3 07/10/2015 at Unknown time  . furosemide (LASIX) 40 MG tablet Take 40 mg by mouth every morning.   11 07/10/2015 at Unknown time  . guaiFENesin (MUCINEX) 600 MG 12 hr tablet Take 600 mg by mouth daily.   07/11/2015 at 1300  . ibuprofen (ADVIL,MOTRIN) 200 MG tablet Take 400 mg by mouth every 6 (six) hours as needed (Pain).   Past Month at  Unknown time  . metoprolol succinate (TOPROL-XL) 50 MG 24 hr tablet Take 50 mg by mouth daily after lunch.   12 07/10/2015 at 1400  . mometasone-formoterol (DULERA) 200-5 MCG/ACT AERO Take 2 puffs first thing in am and then another 2 puffs about 12 hours later. 1 Inhaler 11 Past Week at Unknown time  . potassium chloride SA (K-DUR,KLOR-CON) 20 MEQ tablet Take 40 mEq by mouth daily.    07/10/2015 at Unknown time  . PROAIR HFA 108 (90 BASE) MCG/ACT inhaler Inhale 1 each into the lungs 2 (two) times daily.  3 07/11/2015 at Unknown time  . spironolactone (ALDACTONE) 25 MG tablet Take 25 mg by mouth daily after lunch.   07/10/2015 at Unknown time  . verapamil (VERELAN PM) 240 MG 24 hr capsule Take 240 mg by mouth daily.  11 07/10/2015 at Unknown time  . EPIPEN 2-PAK 0.3 MG/0.3ML SOAJ injection See admin instructions.  1 Taking  . oxyCODONE (OXY IR/ROXICODONE) 5 MG immediate release tablet Take 1 tablet (5 mg total) by mouth every 4 (four) hours as needed for moderate pain. 30 tablet 0 Taking  . Pancrelipase, Lip-Prot-Amyl, 20880 UNITS TABS Take 2 tablets by mouth 3 (three) times daily with meals. (Patient not taking: Reported on 07/11/2015) 540 tablet 2 Not Taking at Unknown time  . UNABLE TO FIND Med Name: Oxygen concentrator.  Per medical necessity pt needs portable oxygen concentrator via nasal cannula with activity. 1 each 1 Taking   Scheduled:  . dextromethorphan-guaiFENesin  1 tablet Oral BID  . ipratropium-albuterol  3 mL Nebulization TID  . piperacillin-tazobactam  3.375 g Intravenous 3 times per day  . potassium chloride SA  40 mEq Oral Once  . vancomycin  1,000 mg Intravenous Q12H   Infusions:      Assessment: 70 yoM with hx of metastatic adenocarcioma of unknown primary c/o increase SOB and cough.  Vancomycin/zosyn per Rx for HCAP on 8/24  Today, 07/13/2015  Afebrile  Labs stable  Cultures pending - ngtd  Goal of Therapy:  Vancomycin trough level 15-20 mcg/ml  Plan: D3  antibiotics  Continue Vancomycin 1Gm IV q12h  Zosyn 3.375 q8h EI  Check vanc trough tonight which will be prior to 5th dose   Adrian Saran, PharmD, BCPS Pager [phone] 07/13/2015 8:48 AM

## 2015-07-13 NOTE — Plan of Care (Signed)
Problem: Phase I Progression Outcomes Goal: Hemodynamically stable Outcome: Not Met (add Reason) Home BP meds not yet resumed.

## 2015-07-13 NOTE — Plan of Care (Signed)
Problem: Phase I Progression Outcomes Goal: Other Phase II Outcomes/Goals Outcome: Progressing C/o sob and having np coughing "nonstop". Given neb treatment and order obtained for mucinex dm.

## 2015-07-13 NOTE — Plan of Care (Signed)
Problem: Phase II Progression Outcomes Goal: Pain controlled on oral analgesia Outcome: Progressing Patient c/o of soreness around thoracentesis site on left side. Given hot pack, refused pain medication.

## 2015-07-13 NOTE — Plan of Care (Signed)
Problem: Phase I Progression Outcomes Goal: Tolerating diet Outcome: Progressing Patient c/o of taste changes and decreased po because of those changes.

## 2015-07-13 NOTE — Progress Notes (Signed)
Ambulated in hallway with RN, patient claims he still gets short of breath.O2 @ 2l/[place] in use while ambulating.

## 2015-07-13 NOTE — Progress Notes (Signed)
K+ 3.0 TODAY PAGED NP. ORDERS RECEIVED.

## 2015-07-14 ENCOUNTER — Inpatient Hospital Stay (HOSPITAL_COMMUNITY): Payer: BLUE CROSS/BLUE SHIELD

## 2015-07-14 DIAGNOSIS — Z720 Tobacco use: Secondary | ICD-10-CM

## 2015-07-14 LAB — BRAIN NATRIURETIC PEPTIDE: B Natriuretic Peptide: 60.1 pg/mL (ref 0.0–100.0)

## 2015-07-14 LAB — CULTURE, RESPIRATORY: CULTURE: NORMAL

## 2015-07-14 LAB — CULTURE, RESPIRATORY W GRAM STAIN

## 2015-07-14 MED ORDER — AMOXICILLIN-POT CLAVULANATE 875-125 MG PO TABS
1.0000 | ORAL_TABLET | Freq: Two times a day (BID) | ORAL | Status: DC
  Administered 2015-07-14 – 2015-07-15 (×3): 1 via ORAL
  Filled 2015-07-14 (×4): qty 1

## 2015-07-14 MED ORDER — LEVOFLOXACIN 750 MG PO TABS
750.0000 mg | ORAL_TABLET | Freq: Every day | ORAL | Status: DC
  Filled 2015-07-14: qty 1

## 2015-07-14 MED ORDER — FUROSEMIDE 10 MG/ML IJ SOLN
20.0000 mg | Freq: Two times a day (BID) | INTRAMUSCULAR | Status: DC
  Filled 2015-07-14 (×2): qty 2

## 2015-07-14 MED ORDER — FUROSEMIDE 10 MG/ML IJ SOLN
20.0000 mg | Freq: Two times a day (BID) | INTRAMUSCULAR | Status: DC
  Administered 2015-07-14 – 2015-07-15 (×3): 20 mg via INTRAVENOUS
  Filled 2015-07-14 (×5): qty 2

## 2015-07-14 NOTE — Progress Notes (Signed)
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 Patient underwent a thoracentesis yielding 1 L of fluid. He was seen by oncology who after reviewing previous CT recommended paracentesis which patient underwent on 8/26 yielding 600 cc of fluid.  By 8/27, patient states no change in his overall quality of breathing. Able to maintain oxygen saturations with ambulation on 2 L nasal cannula. Repeat chest x-ray notes some additional fluid on the left lung, but not loculated looks more like CHF. IV Lasix started an echo ordered.  Assessment/Plan: Principal Problem:   Pleural effusion, left: Stable. No complications from thoracentesis. Awaiting cytology. If positive for malignancy, patient will need to be referred to cardiothoracic surgery for VATS on left side  Volume overload: Possible CHF. Checking BNP and echocardiogram. Starting IV Lasix Active Problems:   HTN (hypertension): Blood pressure trending back up, have restarted some of patient's home medications   Hemochromatosis   Lung cancer   Cigarette smoker   HCAP (healthcare-associated pneumonia): CT on 8/23 confirms pneumonia, continue antibiotics, will change to by mouth   Metastatic adenocarcinoma of unknown origin   Chronic respiratory failure with hypoxia: Currently at baseline of 2 L nasal cannula-only recently started oxygen several weeks ago Ascites: Status post paracentesis. Awaiting cytology  Code Status: Full code  Family Communication: No family present  Disposition Plan: Awaiting  fluid studies from left pleural effusion and ascites   Consultants:  Case discussed with pulmonary  Interventional radiology  Procedures:  Status post 1 L of fluid from thoracentesis done 8/24   status post 600 ml of fluid from paracentesis done 8/26  Antibiotics:  IV vancomycin 8/24-8/27  IV Zosyn 8/24-8/27  By mouth Levaquin 8/27-present   Objective: BP 139/87 mmHg  Pulse 98  Temp(Src) 97.6 F (36.4 C) (Oral)  Resp 18  Ht 6' (1.829 m)  Wt 69.899 kg (154 lb 1.6 oz)  BMI 20.90 kg/m2  SpO2 98%  Intake/Output Summary (Last 24 hours) at 07/14/15 1304 Last data filed at 07/14/15 0900  Gross per 24 hour  Intake    480 ml  Output   1050 ml  Net   -570 ml   Filed Weights   07/11/15 2250  Weight: 69.899 kg (154 lb 1.6 oz)    Exam:   General:  Alert and oriented 3, flattened affect  Cardiovascular: Regular rate and rhythm, S1-S2  Respiratory: Decreased breath sounds throughout, otherwise clear  Abdomen: Soft, nontender, nondistended, positive bowel sounds  Musculoskeletal: No clubbing or cyanosis or edema   Data Reviewed: Basic Metabolic Panel:  Recent Labs Lab 07/11/15 1912 07/12/15 0405  NA 137 138  K 3.1* 3.0*  CL 99* 100*  CO2 31 31  GLUCOSE 97 101*  BUN 12 12  CREATININE 0.72 0.86  CALCIUM 8.7* 8.6*  MG 2.0  --    Liver Function Tests:  Recent Labs Lab 07/11/15 1912  AST 18  ALT 10*  ALKPHOS 72  BILITOT 1.1  PROT 6.7  ALBUMIN 3.3*   No results for input(s): LIPASE, AMYLASE in the last 168 hours. No results for  input(s): AMMONIA in the last 168 hours. CBC:  Recent Labs Lab 07/11/15 1912 07/12/15 0405  WBC 9.5 9.2  NEUTROABS 6.7  --   HGB 13.4 12.8*  HCT 37.9* 36.6*  MCV 100.5* 101.9*  PLT 388 388   Cardiac Enzymes:   No results for input(s): CKTOTAL, CKMB, CKMBINDEX, TROPONINI in the last 168 hours. BNP (last 3 results) No results for input(s): BNP in the last 8760 hours.  ProBNP (last 3 results) No results for  input(s): PROBNP in the last 8760 hours.  CBG: No results for input(s): GLUCAP in the last 168 hours.  Recent Results (from the past 240 hour(s))  Blood culture (routine x 2)     Status: None (Preliminary result)   Collection Time: 07/11/15  8:55 PM  Result Value Ref Range Status   Specimen Description BLOOD RIGHT ANTECUBITAL  Final   Special Requests BOTTLES DRAWN AEROBIC AND ANAEROBIC 5ML  Final   Culture   Final    NO GROWTH 2 DAYS Performed at [facility]    Report Status PENDING  Incomplete  Blood culture (routine x 2)     Status: None (Preliminary result)   Collection Time: 07/11/15  8:55 PM  Result Value Ref Range Status   Specimen Description BLOOD RIGHT HAND  Final   Special Requests BOTTLES DRAWN AEROBIC AND ANAEROBIC 5ML  Final   Culture   Final    NO GROWTH 2 DAYS Performed at [facility]    Report Status PENDING  Incomplete  Culture, expectorated sputum-assessment     Status: None   Collection Time: 07/12/15  9:30 AM  Result Value Ref Range Status   Specimen Description SPUTUM  Final   Special Requests Normal  Final   Sputum evaluation   Final    THIS SPECIMEN IS ACCEPTABLE. RESPIRATORY CULTURE REPORT TO FOLLOW.   Report Status 07/12/2015 FINAL  Final  Culture, respiratory (NON-Expectorated)     Status: None (Preliminary result)   Collection Time: 07/12/15  9:30 AM  Result Value Ref Range Status   Specimen Description SPUTUM  Final   Special Requests NONE  Final   Gram Stain   Final    RARE WBC PRESENT,BOTH PMN AND MONONUCLEAR RARE SQUAMOUS EPITHELIAL CELLS PRESENT NO ORGANISMS SEEN Performed at [facility]    Culture   Final    Culture reincubated for better growth Performed at [facility]    Report Status PENDING  Incomplete  Culture, body fluid-bottle     Status: None (Preliminary result)   Collection Time: 07/12/15 10:22 AM  Result Value Ref Range Status   Specimen Description FLUID LEFT PLEURAL  Final   Special  Requests BAA 10CCS  Final   Culture NO GROWTH 1 DAY  Final   Report Status PENDING  Incomplete  Gram stain     Status: None   Collection Time: 07/12/15 10:22 AM  Result Value Ref Range Status   Specimen Description FLUID LEFT PLEURAL  Final   Special Requests NONE  Final   Gram Stain   Final    MODERATE WBC PRESENT,BOTH PMN AND MONONUCLEAR NO ORGANISMS SEEN    Report Status 07/12/2015 FINAL  Final  Body fluid culture     Status: None (Preliminary result)   Collection Time: 07/13/15 11:49 AM  Result Value Ref Range Status   Specimen Description FLUID LEFT PLEURAL  Final   Special Requests NONE  Final   Gram Stain   Final    ABUNDANT  WBC PRESENT, PREDOMINANTLY MONONUCLEAR NO ORGANISMS SEEN    Culture   Final    NO GROWTH < 24 HOURS Performed at [facility]    Report Status PENDING  Incomplete     Studies: Dg Chest 2 View  07/14/2015   CLINICAL DATA:  Shortness of breath today, recent thoracentesis 2 days ago, still short of breath and not feeling much better, history hypertension, smoking, lung cancer, papillary thyroid cancer, chronic pancreatitis, irritable bowel syndrome  EXAM: CHEST  2 VIEW  COMPARISON:  07/12/2015  FINDINGS: Enlargement of cardiac silhouette with pulmonary vascular congestion.  Bibasilar effusions and atelectasis greater on LEFT increased since previous exam.  Minimal perihilar infiltrates bilaterally likely mild pulmonary edema.  No pneumothorax.  Bones demineralized.  IMPRESSION: Persistent mild CHF with bibasilar effusions atelectasis, increased on LEFT since prior study.   Electronically Signed   By: Lavonia Dana M.D.   On: 07/14/2015 12:57    Scheduled Meds: . dextromethorphan-guaiFENesin  1 tablet Oral BID  . folic acid  1 mg Oral Daily  . furosemide  20 mg Intravenous Q12H  . ipratropium-albuterol  3 mL Nebulization TID  . levofloxacin  750 mg Oral Daily  . metoprolol succinate  50 mg Oral QPC lunch  . potassium chloride SA  40 mEq Oral Once    . spironolactone  25 mg Oral QPC lunch    Continuous Infusions:    Time spent: 25 minutes  [place] [facility] Pager [phone]. If 7PM-7AM, please contact night-coverage at [email or web address], password [facility] 07/14/2015, 1:04 PM  LOS: 3 days

## 2015-07-15 ENCOUNTER — Inpatient Hospital Stay (HOSPITAL_COMMUNITY): Payer: BLUE CROSS/BLUE SHIELD

## 2015-07-15 DIAGNOSIS — I509 Heart failure, unspecified: Secondary | ICD-10-CM

## 2015-07-15 DIAGNOSIS — I5032 Chronic diastolic (congestive) heart failure: Secondary | ICD-10-CM | POA: Diagnosis present

## 2015-07-15 LAB — BASIC METABOLIC PANEL
Anion gap: 6 (ref 5–15)
BUN: 10 mg/dL (ref 6–20)
CALCIUM: 8.5 mg/dL — AB (ref 8.9–10.3)
CO2: 38 mmol/L — AB (ref 22–32)
CREATININE: 0.86 mg/dL (ref 0.61–1.24)
Chloride: 95 mmol/L — ABNORMAL LOW (ref 101–111)
GLUCOSE: 122 mg/dL — AB (ref 65–99)
Potassium: 3.1 mmol/L — ABNORMAL LOW (ref 3.5–5.1)
Sodium: 139 mmol/L (ref 135–145)

## 2015-07-15 MED ORDER — OXYCODONE HCL 5 MG PO TABS: 5.0000 mg | ORAL_TABLET | ORAL | Status: DC | PRN

## 2015-07-15 MED ORDER — LISINOPRIL 10 MG PO TABS
10.0000 mg | ORAL_TABLET | Freq: Every day | ORAL | Status: DC
  Administered 2015-07-15: 10 mg via ORAL
  Filled 2015-07-15: qty 1

## 2015-07-15 MED ORDER — AMOXICILLIN-POT CLAVULANATE 875-125 MG PO TABS: 1.0000 | ORAL_TABLET | Freq: Two times a day (BID) | ORAL | Status: DC

## 2015-07-15 MED ORDER — ACETAMINOPHEN-CODEINE #3 300-30 MG PO TABS: 1.0000 | ORAL_TABLET | ORAL | Status: DC | PRN

## 2015-07-15 MED ORDER — DM-GUAIFENESIN ER 30-600 MG PO TB12: 1.0000 | ORAL_TABLET | Freq: Two times a day (BID) | ORAL | Status: AC | PRN

## 2015-07-15 MED ORDER — LISINOPRIL 10 MG PO TABS: 10.0000 mg | ORAL_TABLET | Freq: Every day | ORAL | Status: DC

## 2015-07-15 MED ORDER — LIVING BETTER WITH HEART FAILURE BOOK
Freq: Once | Status: AC
  Administered 2015-07-15: 15:00:00

## 2015-07-15 MED ORDER — ALBUTEROL SULFATE (2.5 MG/3ML) 0.083% IN NEBU: 2.5000 mg | INHALATION_SOLUTION | Freq: Four times a day (QID) | RESPIRATORY_TRACT | Status: AC | PRN

## 2015-07-15 NOTE — Plan of Care (Signed)
Problem: Discharge Progression Outcomes Goal: Flu vaccine received if indicated Outcome: Not Met (add Reason) Pt. Refused at this time Goal: Pneumonia vaccine received if indicated Outcome: Not Met (add Reason) Pt. Refused at this time.

## 2015-07-15 NOTE — Progress Notes (Signed)
Discharge teaching completed with teach back. Discharge instructions given and reviewed with pt. Pt. to call Monday for follow up appointment. Living Better with Heart Failure book and folder given to pt. Pt. States he will review at home. Prescriptions given for Tylenol #3 and Oxycodone. Pt. to pick up other medications at [facility]. Refused Flu shot and PNE vaccine at this time, states he will check with his Oncologist. Pt. Left via wheelchair with his sister, Discharged to home. O2 at 2L/[place]. No respiratory distress noted.

## 2015-07-15 NOTE — Discharge Summary (Signed)
Discharge Summary  Henry [name] LKG:[id number] DOB: 1951-02-26  PCP: Donnie Coffin, MD  Admit date: 07/11/2015 Discharge date: 07/15/2015  Time spent: 25 minutes  Recommendations for Outpatient Follow-up:  1. Medication change: Verapamil discontinued 2. New medication: Lisinopril 10 mg by mouth daily  3. New medication: Augmentin 500 mg by mouth twice a day 2 more days 4. New medication: Mucinex DM 1 tablespoon by mouth twice a day when necessary for cough  Discharge Diagnoses:  Active Hospital Problems   Diagnosis Date Noted  . Pleural effusion, left 07/11/2015  . Chronic diastolic heart failure 66/44/0347  . Chronic respiratory failure with hypoxia 07/12/2015  . HCAP (healthcare-associated pneumonia) 07/11/2015  . Metastatic adenocarcinoma of unknown origin   . Cigarette smoker 07/04/2015  . Lung cancer 06/22/2014  . HTN (hypertension) 01/31/2013  . Hemochromatosis 01/31/2013    Resolved Hospital Problems   Diagnosis Date Noted Date Resolved  No resolved problems to display.    Discharge Condition: Improved, being discharged home  Diet recommendation: Heart healthy  Filed Weights   07/11/15 2250 07/14/15 1403  Weight: 69.899 kg (154 lb 1.6 oz) 70.489 kg (155 lb 6.4 oz)    History of present illness:  Patient is a 64 year old male with past history of metastatic adenocarcinoma of unknown primary to the lungs along with chronic respiratory failure on 2 L nasal cannula who in the last month underwent a right pleurodesis for recurrent pleural effusion and was admitted on 8/24 for progressively worsening shortness of breath and found to have a large left pleural effusion.  Hospital Course:  Principal Problem:   Pleural effusion, left: Seen by interventional radiology. Underwent thoracentesis yielding 1 L of fluid. No evidence of infection. No empyema. Final cytology pending, but no malignant cells noted. Active Problems:   HTN (hypertension): Verapamil will be  discontinued in place of lisinopril, started for core measures for diastolic heart failure. See below.     Cigarette smoker: Nicotine patch   HCAP (healthcare-associated pneumonia): Initially on broad-spectrum IV antibiotics, changed over to by mouth Augmentin, prescription given for several more days to complete 7 day course.    Metastatic adenocarcinoma of unknown origin: Will follow-up with Dr. Julien Nordmann, oncology  Acute on Chronic respiratory failure with hypoxia: multifactorial secondary to CHF, pleural effusions, COPD and lung cancer. He improved with diuresis.   Acute on diastolic chronic heart failure: Despite thoracentesis and paracentesis, patient's breathing did not feel much better. He was able to maintain oxygen saturations on 2 L nasal cannula. Repeat chest x-ray noted increased pleural effusion following thoracentesis a few days prior, however looked more consistent with CHF. BNP checked and found to be normal. Patient started on IV Lasix and diuresed several liters. Echocardiogram found to have grade 1 diastolic dysfunction. Patient clinically feeling better. Given information about heart fire. Outpatient follow-up with his PCP. No beta blocker due to COPD issues. Will substitute his ACE inhibitor for verapamil for core measures.  Ascites: Seen on previous CT. Lispro oncology recognitions, patient underwent paracentesis by interventional radiology. 600 ML's removed. No signs of peritonitis.  Consultants:  Case discussed with pulmonary  Interventional radiology  Procedures:  Status post 1 L of fluid from thoracentesis done 8/24   status post 600 ml of fluid from paracentesis done 8/26  2-D echo done 4/25: Grade 1 diastolic dysfunction  Discharge Exam: BP 155/93 mmHg  Pulse 79  Temp(Src) 98 F (36.7 C) (Oral)  Resp 18  Ht 6' (1.829 m)  Wt 70.489 kg (155 lb  6.4 oz)  BMI 21.07 kg/m2  SpO2 100%  General: Alert and oriented 3, no acute distress Cardiovascular: Regular  rate and rhythm, S1-S2 Respiratory: Decreased breath sounds bibasilar  Discharge Instructions You were cared for by a hospitalist during your hospital stay. If you have any questions about your discharge medications or the care you received while you were in the hospital after you are discharged, you can call the unit and asked to speak with the hospitalist on call if the hospitalist that took care of you is not available. Once you are discharged, your primary care physician will handle any further medical issues. Please note that NO REFILLS for any discharge medications will be authorized once you are discharged, as it is imperative that you return to your primary care physician (or establish a relationship with a primary care physician if you do not have one) for your aftercare needs so that they can reassess your need for medications and monitor your lab values.  Discharge Instructions    Diet - low sodium heart healthy    Complete by:  As directed      Increase activity slowly    Complete by:  As directed             Medication List    STOP taking these medications        guaiFENesin 600 MG 12 hr tablet  Commonly known as:  MUCINEX     Pancrelipase (Lip-Prot-Amyl) 20880 UNITS Tabs     verapamil 240 MG 24 hr capsule  Commonly known as:  VERELAN PM      TAKE these medications        acetaminophen-codeine 300-30 MG per tablet  Commonly known as:  TYLENOL #3  Take 1-2 tablets by mouth every 4 (four) hours as needed for moderate pain.     amoxicillin-clavulanate 875-125 MG per tablet  Commonly known as:  AUGMENTIN  Take 1 tablet by mouth every 12 (twelve) hours.     dextromethorphan-guaiFENesin 30-600 MG per 12 hr tablet  Commonly known as:  MUCINEX DM  Take 1 tablet by mouth 2 (two) times daily as needed for cough.     EPIPEN 2-PAK 0.3 mg/0.3 mL Soaj injection  Generic drug:  EPINEPHrine  See admin instructions.     folic acid 1 MG tablet  Commonly known as:  FOLVITE    Take 1 tablet (1 mg total) by mouth daily.     furosemide 40 MG tablet  Commonly known as:  LASIX  Take 40 mg by mouth every morning.     ibuprofen 200 MG tablet  Commonly known as:  ADVIL,MOTRIN  Take 400 mg by mouth every 6 (six) hours as needed (Pain).     lisinopril 10 MG tablet  Commonly known as:  PRINIVIL,ZESTRIL  Take 1 tablet (10 mg total) by mouth daily.     metoprolol succinate 50 MG 24 hr tablet  Commonly known as:  TOPROL-XL  Take 50 mg by mouth daily after lunch.     mometasone-formoterol 200-5 MCG/ACT Aero  Commonly known as:  DULERA  Take 2 puffs first thing in am and then another 2 puffs about 12 hours later.     oxyCODONE 5 MG immediate release tablet  Commonly known as:  Oxy IR/ROXICODONE  Take 1 tablet (5 mg total) by mouth every 4 (four) hours as needed for moderate pain.     potassium chloride SA 20 MEQ tablet  Commonly known as:  K-DUR,KLOR-CON  Take 40  mEq by mouth daily.     PROAIR HFA 108 (90 BASE) MCG/ACT inhaler  Generic drug:  albuterol  Inhale 1 each into the lungs 2 (two) times daily.     albuterol (2.5 MG/3ML) 0.083% nebulizer solution  Commonly known as:  PROVENTIL  Take 3 mLs (2.5 mg total) by nebulization every 6 (six) hours as needed for wheezing or shortness of breath.     spironolactone 25 MG tablet  Commonly known as:  ALDACTONE  Take 25 mg by mouth daily after lunch.     UNABLE TO FIND  Med Name: Oxygen concentrator.  Per medical necessity pt needs portable oxygen concentrator via nasal cannula with activity.       Allergies  Allergen Reactions  . Other Diarrhea    Red meats        Follow-up Information    Follow up with Donnie Coffin, MD In 1 month.   Specialty:  Family Medicine   Contact information:   [address] [place] [address] [phone]        The results of significant diagnostics from this hospitalization (including imaging, microbiology, ancillary and laboratory) are listed  below for reference.    Significant Diagnostic Studies: Dg Chest 1 View  07/12/2015   CLINICAL DATA:  Status post left-sided thoracentesis.  EXAM: CHEST  1 VIEW  COMPARISON:  June 28, 2014.  FINDINGS: Stable cardiomediastinal silhouette. No pneumothorax is noted. Minimal right pleural effusion is noted which is increased compared to prior exam. Right basilar opacity is noted which is stable and consistent with scarring or atelectasis. Mild left pleural effusion is noted in left lung base. Increased left upper lobe opacity is noted concerning for possible pneumonia or atelectasis. Mild central pulmonary vascular congestion is noted.  IMPRESSION: Mild central pulmonary vascular congestion. Minimal right pleural effusion is noted. Increased left upper lobe opacity is noted concerning for pneumonia or possibly atelectasis. Mild left pleural effusion is noted in left lung base status post thoracentesis. No definite pneumothorax is noted.   Electronically Signed   By: Marijo Conception, M.D.   On: 07/12/2015 10:59   Dg Chest 2 View  07/14/2015   CLINICAL DATA:  Shortness of breath today, recent thoracentesis 2 days ago, still short of breath and not feeling much better, history hypertension, smoking, lung cancer, papillary thyroid cancer, chronic pancreatitis, irritable bowel syndrome  EXAM: CHEST  2 VIEW  COMPARISON:  07/12/2015  FINDINGS: Enlargement of cardiac silhouette with pulmonary vascular congestion.  Bibasilar effusions and atelectasis greater on LEFT increased since previous exam.  Minimal perihilar infiltrates bilaterally likely mild pulmonary edema.  No pneumothorax.  Bones demineralized.  IMPRESSION: Persistent mild CHF with bibasilar effusions atelectasis, increased on LEFT since prior study.   Electronically Signed   By: Lavonia Dana M.D.   On: 07/14/2015 12:57   Dg Chest 2 View  07/11/2015   CLINICAL DATA:  Lung cancer on chemotherapy, increased shortness of breath for 1 week, fluid on lungs,  productive cough for 3 months, hypertension  EXAM: CHEST  2 VIEW  COMPARISON:  CT chest 07/10/2015  FINDINGS: Enlargement of cardiac silhouette.  Tortuous aorta.  Mediastinal contours and pulmonary vascularity normal.  Bibasilar effusions LEFT greater than RIGHT similar to prior CT.  Bibasilar pulmonary opacities may represent infiltrate or atelectasis, little changed from prior CT.  Upper lungs grossly clear.  No pneumothorax.  Bones demineralized.  IMPRESSION: Bibasilar pleural effusions LEFT greater than RIGHT with bibasilar pulmonary opacities that  may represent atelectasis or consolidation.  Little interval change.   Electronically Signed   By: Lavonia Dana M.D.   On: 07/11/2015 18:57   Ct Chest W Contrast  07/10/2015   CLINICAL DATA:  Restaging lung cancer. Initial diagnosis 2015. History of right lobectomy.  EXAM: CT CHEST, ABDOMEN, AND PELVIS WITH CONTRAST  TECHNIQUE: Multidetector CT imaging of the chest, abdomen and pelvis was performed following the standard protocol during bolus administration of intravenous contrast.  CONTRAST:  124m OMNIPAQUE IOHEXOL 300 MG/ML  SOLN  COMPARISON:  CT 05/01/2015 and PET-CT 06/30/2014  FINDINGS: CT CHEST FINDINGS  Chest wall: Stable small axillary lymph nodes but no mass or overt adenopathy. The thyroid gland appears Stable. Stable small nodules. No supraclavicular adenopathy. The bony thorax is intact. No obvious metastatic lesions involving the thoracic spine or sternum. No obvious rib lesions. Progressive metastatic disease involving the L1 vertebral body.  Mediastinum: The heart is mildly enlarged but stable. Stable tortuosity, ectasia and calcification of the thoracic aorta. Stable small scattered mediastinal lymph nodes. The esophagus is grossly normal.  Lungs/ pleura: Persistent loculated pleural fluid collection on the right side with density along the pleural surface likely due to previous talc pleurodesis. Persistent overlying long atelectasis and right  basilar airspace consolidation. Stable bilobed masslike densities in the right middle lobe and right lower lobe on image number 42.  There is a new large left pleural effusion with areas of probable loculation. I do not see any obvious enhancing pleural nodules. A left-sided thoracentesis may be useful for therapeutic and diagnostic purposes and to exclude malignant left effusion.  CT ABDOMEN AND PELVIS FINDINGS  Hepatobiliary: Stable scattered hepatic cysts. No findings suspicious for hepatic metastatic disease. The portal vein is patent.  Pancreas: Stable small cystic lesion in the pancreatic head. No inflammation or ductal dilatation.  Spleen: Normal size.  No focal lesions.  Adrenals/Urinary Tract: Bilateral nodular enlargement of both adrenal glands which could reflect nodular hyperplasia. No obvious mass/metastasis. The kidneys are unremarkable and stable.  Stomach/Bowel: The stomach wall is thickened but this is likely due to under distension. No mass or obstruction. The small bowel is grossly normal. No inflammation or obstruction. Enlarging Masses involving the ascending colon. These could be adenomas or possible metastasis.  Vascular/Lymphatic: Stable tortuosity, ectasia and calcification of the abdominal aorta. The branch vessels are patent. 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 The SMA may also be partially occluded/thrombosed. No mesenteric or retroperitoneal mass or adenopathy. Small scattered lymph nodes are noted.  Other: Progression of abdominal/ pelvic ascites without obvious omental or peritoneal surface implants. The bladder appears normal. No pelvic mass or adenopathy. No inguinal mass or adenopathy.  Musculoskeletal: No significant bony findings in the pelvis. Progressive L1 sclerotic metastatic disease.  IMPRESSION: 1. Persistent right-sided loculated pleural fluid collection and evidence of prior talc pleurodesis. No obvious enhancing  pleural masses. Persistent right basilar airspace consolidation and stable bilobed masslike densities in the right middle lobe and right lower lobe. 2. New large left pleural effusion with areas of probable loculation. No obvious enhancing pleural nodules but there left-sided thoracentesis may be useful for diagnostic and therapeutic purposes. 3. Progressive abdominal/pelvic ascites. 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 4. Stable nodular enlargement of both adrenal glands. 5. Enlarging soft tissue masses involving the ascending colon. These could be adenomas or metastasis. 6. Progressive sclerotic metastatic disease involving the L1 vertebral body.  No definite new bone metastasis.   Electronically Signed   By: Marijo Sanes M.D.   On: 07/10/2015 14:28   Ct Abdomen Pelvis W Contrast  07/10/2015   CLINICAL DATA:  Restaging lung cancer. Initial diagnosis 2015. History of right lobectomy.  EXAM: CT CHEST, ABDOMEN, AND PELVIS WITH CONTRAST  TECHNIQUE: Multidetector CT imaging of the chest, abdomen and pelvis was performed following the standard protocol during bolus administration of intravenous contrast.  CONTRAST:  152m OMNIPAQUE IOHEXOL 300 MG/ML  SOLN  COMPARISON:  CT 05/01/2015 and PET-CT 06/30/2014  FINDINGS: CT CHEST FINDINGS  Chest wall: Stable small axillary lymph nodes but no mass or overt adenopathy. The thyroid gland appears Stable. Stable small nodules. No supraclavicular adenopathy. The bony thorax is intact. No obvious metastatic lesions involving the thoracic spine or sternum. No obvious rib lesions. Progressive metastatic disease involving the L1 vertebral body.  Mediastinum: The heart is mildly enlarged but stable. Stable tortuosity, ectasia and calcification of the thoracic aorta. Stable small scattered mediastinal lymph nodes. The esophagus is grossly normal.  Lungs/ pleura: Persistent loculated  pleural fluid collection on the right side with density along the pleural surface likely due to previous talc pleurodesis. Persistent overlying long atelectasis and right basilar airspace consolidation. Stable bilobed masslike densities in the right middle lobe and right lower lobe on image number 42.  There is a new large left pleural effusion with areas of probable loculation. I do not see any obvious enhancing pleural nodules. A left-sided thoracentesis may be useful for therapeutic and diagnostic purposes and to exclude malignant left effusion.  CT ABDOMEN AND PELVIS FINDINGS  Hepatobiliary: Stable scattered hepatic cysts. No findings suspicious for hepatic metastatic disease. The portal vein is patent.  Pancreas: Stable small cystic lesion in the pancreatic head. No inflammation or ductal dilatation.  Spleen: Normal size.  No focal lesions.  Adrenals/Urinary Tract: Bilateral nodular enlargement of both adrenal glands which could reflect nodular hyperplasia. No obvious mass/metastasis. The kidneys are unremarkable and stable.  Stomach/Bowel: The stomach wall is thickened but this is likely due to under distension. No mass or obstruction. The small bowel is grossly normal. No inflammation or obstruction. Enlarging Masses involving the ascending colon. These could be adenomas or possible metastasis.  Vascular/Lymphatic: Stable tortuosity, ectasia and calcification of the abdominal aorta. The branch vessels are patent. 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 The SMA may also be partially occluded/thrombosed. No mesenteric or retroperitoneal mass or adenopathy. Small scattered lymph nodes are noted.  Other: Progression of abdominal/ pelvic ascites without obvious omental or peritoneal surface implants. The bladder appears normal. No pelvic mass or adenopathy. No inguinal mass or adenopathy.  Musculoskeletal: No significant bony findings in the pelvis.  Progressive L1 sclerotic metastatic disease.  IMPRESSION: 1. Persistent right-sided loculated pleural fluid collection and evidence of prior talc pleurodesis. No obvious enhancing pleural masses. Persistent right basilar airspace consolidation and stable bilobed masslike densities in the right middle lobe and right lower lobe. 2. New large left pleural effusion with areas of probable loculation. No obvious enhancing pleural nodules but there left-sided thoracentesis may be useful for diagnostic and therapeutic purposes. 3. Progressive abdominal/pelvic ascites. 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 4. Stable nodular enlargement of both adrenal glands. 5. Enlarging soft tissue masses involving the ascending colon. These could be adenomas or metastasis. 6. Progressive sclerotic metastatic disease involving the L1 vertebral body. No definite new  bone metastasis.   Electronically Signed   By: Marijo Sanes M.D.   On: 07/10/2015 14:28   US Paracentesis  07/13/2015   INDICATION: Ascites, request for diagnostic and therapeutic paracentesis.  EXAM: ULTRASOUND-GUIDED PARACENTESIS  COMPARISON:  None.  MEDICATIONS: None.  COMPLICATIONS: None immediate  TECHNIQUE: Informed written consent was obtained from the patient after a discussion of the risks, benefits and alternatives to treatment. A timeout was performed prior to the initiation of the procedure.  Initial ultrasound scanning demonstrates a small amount of ascites within the right upper abdominal quadrant. The right upper abdomen was prepped and draped in the usual sterile fashion. 1% lidocaine was used for local anesthesia.  Under direct ultrasound guidance, a 19 gauge, 7-cm, Yueh catheter was introduced. An ultrasound image was saved for documentation purposed. The paracentesis was performed. The catheter was removed and a dressing was applied. The patient tolerated  the procedure well without immediate post procedural complication.  FINDINGS: A total of approximately 600 ml of serous fluid was removed. Samples were sent to the laboratory as requested by the clinical team.  IMPRESSION: Successful ultrasound-guided paracentesis yielding 600 ml of peritoneal fluid.  Read By:  Tsosie Billing PA-C   Electronically Signed   By: Jerilynn Mages.  Shick M.D.   On: 07/13/2015 14:39   US Thoracentesis Asp Pleural Space W/img Guide  07/12/2015   INDICATION: Patient with history of lung and thyroid cancers, dyspnea, left pleural effusion. Request is made for diagnostic and therapeutic left thoracentesis.  EXAM: ULTRASOUND GUIDED DIAGNOSTIC AND THERAPEUTIC LEFT THORACENTESIS  COMPARISON:  None.  MEDICATIONS: None  COMPLICATIONS: None immediate  TECHNIQUE: Informed written consent was obtained from the patient after a discussion of the risks, benefits and alternatives to treatment. A timeout was performed prior to the initiation of the procedure.  Initial ultrasound scanning demonstrates a moderate to large left pleural effusion. The lower chest was prepped and draped in the usual sterile fashion. 1% lidocaine was used for local anesthesia.  An ultrasound image was saved for documentation purposes. A 6 Fr Safe-T-Centesis catheter was introduced. The thoracentesis was performed. The catheter was removed and a dressing was applied. The patient tolerated the procedure well without immediate post procedural complication. The patient was escorted to have an upright chest radiograph.  FINDINGS: A total of approximately 1 liter of turbid, dark bloody fluid was removed. Requested samples were sent to the laboratory.  IMPRESSION: Successful ultrasound-guided diagnostic/therapeutic left sided thoracentesis yielding 1 liter of pleural fluid.  Read by: Rowe Robert, PA-C   Electronically Signed   By: Aletta Edouard M.D.   On: 07/12/2015 11:05    Microbiology: Recent Results (from the past 240 hour(s))  Blood  culture (routine x 2)     Status: None (Preliminary result)   Collection Time: 07/11/15  8:55 PM  Result Value Ref Range Status   Specimen Description BLOOD RIGHT ANTECUBITAL  Final   Special Requests BOTTLES DRAWN AEROBIC AND ANAEROBIC 5ML  Final   Culture   Final    NO GROWTH 3 DAYS Performed at [facility]    Report Status PENDING  Incomplete  Blood culture (routine x 2)     Status: None (Preliminary result)   Collection Time: 07/11/15  8:55 PM  Result Value Ref Range Status   Specimen Description BLOOD RIGHT HAND  Final   Special Requests BOTTLES DRAWN AEROBIC AND ANAEROBIC 5ML  Final   Culture   Final    NO GROWTH 3 DAYS Performed at [facility]    Report Status PENDING  Incomplete  Culture, expectorated sputum-assessment     Status: None   Collection Time: 07/12/15  9:30 AM  Result Value Ref Range Status   Specimen Description SPUTUM  Final   Special Requests Normal  Final   Sputum evaluation   Final    THIS SPECIMEN IS ACCEPTABLE. RESPIRATORY CULTURE REPORT TO FOLLOW.   Report Status 07/12/2015 FINAL  Final  Culture, respiratory (NON-Expectorated)     Status: None   Collection Time: 07/12/15  9:30 AM  Result Value Ref Range Status   Specimen Description SPUTUM  Final   Special Requests NONE  Final   Gram Stain   Final    RARE WBC PRESENT,BOTH PMN AND MONONUCLEAR RARE SQUAMOUS EPITHELIAL CELLS PRESENT NO ORGANISMS SEEN Performed at [facility]    Culture   Final    NORMAL OROPHARYNGEAL FLORA Performed at [facility]    Report Status 07/14/2015 FINAL  Final  Culture, body fluid-bottle     Status: None (Preliminary result)   Collection Time: 07/12/15 10:22 AM  Result Value Ref Range Status   Specimen Description FLUID LEFT PLEURAL  Final   Special Requests BAA 10CCS  Final   Culture NO GROWTH 2 DAYS  Final   Report Status PENDING  Incomplete  Gram stain     Status: None   Collection Time: 07/12/15 10:22 AM  Result Value Ref  Range Status   Specimen Description FLUID LEFT PLEURAL  Final   Special Requests NONE  Final   Gram Stain   Final    MODERATE WBC PRESENT,BOTH PMN AND MONONUCLEAR NO ORGANISMS SEEN    Report Status 07/12/2015 FINAL  Final  Body fluid culture     Status: None (Preliminary result)   Collection Time: 07/13/15 11:49 AM  Result Value Ref Range Status   Specimen Description FLUID LEFT PLEURAL  Final   Special Requests NONE  Final   Gram Stain   Final    ABUNDANT WBC PRESENT, PREDOMINANTLY MONONUCLEAR NO ORGANISMS SEEN    Culture   Final    NO GROWTH 2 DAYS Performed at [facility]    Report Status PENDING  Incomplete     Labs: Basic Metabolic Panel:  Recent Labs Lab 07/11/15 1912 07/12/15 0405 07/15/15 0456  NA 137 138 139  K 3.1* 3.0* 3.1*  CL 99* 100* 95*  CO2 31 31 38*  GLUCOSE 97 101* 122*  BUN '12 12 10  '$ CREATININE 0.72 0.86 0.86  CALCIUM 8.7* 8.6* 8.5*  MG 2.0  --   --    Liver Function Tests:  Recent Labs Lab 07/11/15 1912  AST 18  ALT 10*  ALKPHOS 72  BILITOT 1.1  PROT 6.7  ALBUMIN 3.3*   No results for input(s): LIPASE, AMYLASE in the last 168 hours. No results for input(s): AMMONIA in the last 168 hours. CBC:  Recent Labs Lab 07/11/15 1912 07/12/15 0405  WBC 9.5 9.2  NEUTROABS 6.7  --   HGB 13.4 12.8*  HCT 37.9* 36.6*  MCV 100.5* 101.9*  PLT 388 388   Cardiac Enzymes: No results for input(s): CKTOTAL, CKMB, CKMBINDEX, TROPONINI in the last 168 hours. BNP: BNP (last 3 results)  Recent Labs  07/14/15 1400  BNP 60.1    ProBNP (last 3 results) No results for input(s): PROBNP in the last 8760 hours.  CBG: No results for input(s): GLUCAP in the last 168 hours.     Signed:  Carleta Woodrow K  [facility] 07/15/2015, 2:02 PM

## 2015-07-15 NOTE — Progress Notes (Signed)
  Echocardiogram 2D Echocardiogram has been performed.  Lysle Rubens 07/15/2015, 12:12 PM

## 2015-07-15 NOTE — Discharge Instructions (Signed)
Check weight daily.  Call Dr.Mitchell if >5 pounds over usual weight.  Heart Failure Heart failure means your heart has trouble pumping blood. This makes it hard for your body to work well. Heart failure is usually a long-term (chronic) condition. You must take good care of yourself and follow your doctor's treatment plan. HOME CARE  Take your heart medicine as told by your doctor.  Do not stop taking medicine unless your doctor tells you to.  Do not skip any dose of medicine.  Refill your medicines before they run out.  Take other medicines only as told by your doctor or pharmacist.  Stay active if told by your doctor. The elderly and people with severe heart failure should talk with a doctor about physical activity.  Eat heart-healthy foods. Choose foods that are without trans fat and are low in saturated fat, cholesterol, and salt (sodium). This includes fresh or frozen fruits and vegetables, fish, lean meats, fat-free or low-fat dairy foods, whole grains, and high-fiber foods. Lentils and dried peas and beans (legumes) are also good choices.  Limit salt if told by your doctor.  Cook in a healthy way. Roast, grill, broil, bake, poach, steam, or stir-fry foods.  Limit fluids as told by your doctor.  Weigh yourself every morning. Do this after you pee (urinate) and before you eat breakfast. Write down your weight to give to your doctor.  Take your blood pressure and write it down if your doctor tells you to.  Ask your doctor how to check your pulse. Check your pulse as told.  Lose weight if told by your doctor.  Stop smoking or chewing tobacco. Do not use gum or patches that help you quit without your doctor's approval.  Schedule and go to doctor visits as told.  Nonpregnant women should have no more than 1 drink a day. Men should have no more than 2 drinks a day. Talk to your doctor about drinking alcohol.  Stop illegal drug use.  Stay current with shots  (immunizations).  Manage your health conditions as told by your doctor.  Learn to manage your stress.  Rest when you are tired.  If it is really hot outside:  Avoid intense activities.  Use air conditioning or fans, or get in a cooler place.  Avoid caffeine and alcohol.  Wear loose-fitting, lightweight, and light-colored clothing.  If it is really cold outside:  Avoid intense activities.  Layer your clothing.  Wear mittens or gloves, a hat, and a scarf when going outside.  Avoid alcohol.  Learn about heart failure and get support as needed.  Get help to maintain or improve your quality of life and your ability to care for yourself as needed. GET HELP IF:   You gain 03 lb/1.4 kg or more in 1 day or 05 lb/2.3 kg in a week.  You are more short of breath than usual.  You cannot do your normal activities.  You tire easily.  You cough more than normal, especially with activity.  You have any or more puffiness (swelling) in areas such as your hands, feet, ankles, or belly (abdomen).  You cannot sleep because it is hard to breathe.  You feel like your heart is beating fast (palpitations).  You get dizzy or light-headed when you stand up. GET HELP RIGHT AWAY IF:   You have trouble breathing.  There is a change in mental status, such as becoming less alert or not being able to focus.  You have chest pain  or discomfort.  You faint. MAKE SURE YOU:   Understand these instructions.  Will watch your condition.  Will get help right away if you are not doing well or get worse. Document Released: 08/12/2008 Document Revised: 03/20/2014 Document Reviewed: 12/20/2012 [facility] Patient Information 2015 [place], [place]. This information is not intended to replace advice given to you by your health care provider. Make sure you discuss any questions you have with your health care provider.

## 2015-07-16 LAB — CULTURE, BLOOD (ROUTINE X 2)
CULTURE: NO GROWTH
CULTURE: NO GROWTH

## 2015-07-16 LAB — BODY FLUID CULTURE: CULTURE: NO GROWTH

## 2015-07-17 ENCOUNTER — Telehealth: Payer: Self-pay | Admitting: Internal Medicine

## 2015-07-17 LAB — CULTURE, BODY FLUID-BOTTLE: CULTURE: NO GROWTH

## 2015-07-17 LAB — CULTURE, BODY FLUID W GRAM STAIN -BOTTLE

## 2015-07-17 NOTE — Telephone Encounter (Signed)
Called and spoke to pt. Pt stated since hospital d/c he has noticed a slight increase in DOE, pt stated he did not notice much of an improvement when he had the thoracentesis (on 07/11/15) because he was not active in the hospital. Pt states when he got home he noticed the increase in SOB during exertion. Pt questioning if he should go back to the ED to be evaluated. Pt able to complete full sentences and was not in an current distress. Pt also c/o prod cough with yellow mucus, orthopnea (improved) and LE edema (improved). Pt denies CP/tightness, pain, f/c/s.   Dr. Melvyn Novas please advise. Thanks.

## 2015-07-17 NOTE — Telephone Encounter (Signed)
Pt called and informed of MW rec Pt stated that he is starting to feel better this afternoon and would like to schedule an appt with MW for this week Pt scheduled for 07/20/15 at 11:30am and was instructed to bring all meds to appt Pt instructed again that if he starts to experience pain again he needed to go to the nearest ER for evaluation Pt voiced understanding  Nothing further is needed at this time

## 2015-07-17 NOTE — Telephone Encounter (Signed)
If at any point  he can't get comfortable at rest he should go to er - if he can then set up appt with all meds in hand for this week with me

## 2015-07-18 ENCOUNTER — Ambulatory Visit: Payer: BLUE CROSS/BLUE SHIELD | Admitting: Physical Therapy

## 2015-07-18 ENCOUNTER — Encounter: Payer: Self-pay | Admitting: Vascular Surgery

## 2015-07-18 ENCOUNTER — Other Ambulatory Visit: Payer: Self-pay | Admitting: Thoracic Surgery (Cardiothoracic Vascular Surgery)

## 2015-07-18 ENCOUNTER — Telehealth: Payer: Self-pay | Admitting: *Deleted

## 2015-07-18 NOTE — Telephone Encounter (Signed)
I told pt to contact PCP as this is not a med Mohamed prescribed.

## 2015-07-18 NOTE — Telephone Encounter (Signed)
Voicemail from patient requesting "refill on Flomax and help getting medications.  Went to ED last week.  Was told to get my meds and take with me which I did but I never received my medications.  I need to take my medications."  Patient was admitted.  This nurse called [facility].  Per Estill Bamberg and Gershon Mussel, medications were given to manager of [facility] today.  Called Mr. Vandergrift who is aware of medications being located and returned.  "The Flomax was received during a past hospitalization.  I think I need this because for the past couple of months I've had trouble urinating and now the past few weeks I'm on diuretics.  I have an urgency and when I go only a small amount comes out.  I sometimes feel like I still have to go."  Denies pain or burning with urination.  Will notify Dr. Julien Nordmann and collaborative nurse.  Return number [phone].

## 2015-07-19 ENCOUNTER — Ambulatory Visit (INDEPENDENT_AMBULATORY_CARE_PROVIDER_SITE_OTHER): Payer: BLUE CROSS/BLUE SHIELD | Admitting: Vascular Surgery

## 2015-07-19 ENCOUNTER — Encounter: Payer: Self-pay | Admitting: Vascular Surgery

## 2015-07-19 VITALS — BP 113/82 | HR 85 | Temp 97.1°F | Resp 18 | Ht 72.0 in | Wt 152.0 lb

## 2015-07-19 DIAGNOSIS — I83811 Varicose veins of right lower extremities with pain: Secondary | ICD-10-CM

## 2015-07-19 NOTE — Progress Notes (Signed)
Here today for continued discussion of venous hypertension in his right leg. He reports that the swelling and discomfort in his right leg. He has been very compliant with thigh high graduated compression stockings. This is resolved the swelling and discomfort. He does report that when he had this episode of swelling that he feels that he is having generalized overall decompensation since then. 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  Past Medical History  Diagnosis Date  . Hypertension   . GERD (gastroesophageal reflux disease)   . Hemochromatosis   . Hx of adenomatous colonic polyps   . Umbilical hernia     none since weight loss  . Diverticulosis   . Lumbar spondylosis     ankle  . Pleural effusion on right 12/16/13    per chest xray  . Shortness of breath     with  exertion  . Chronic pancreatitis   . IBS (irritable bowel syndrome)   . Lung cancer 06/2014  . 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 Pleural effusion 07-11-15    Social History  Substance Use Topics  . Smoking status: Former Smoker -- 2.00 packs/day for 40 years    Types: Cigarettes    Quit date: 07/09/2015  . Smokeless tobacco: Never Used     Comment: 12/ to 3/4 packer per day now  . Alcohol Use: 12.6 oz/week    21 Cans of beer per week     Comment: Daily 2-3 beers    Family History  Problem Relation Age of Onset  . Colon cancer Maternal Grandmother 73  . Stomach cancer Paternal Grandfather 74  . Hypertension Mother   . Varicose Veins Mother   . Hypertension Father   . Varicose Veins Father   . Hypertension Sister   . Heart disease Sister     Allergies  Allergen Reactions  . Other Diarrhea    Red meats      Current outpatient prescriptions:  .  acetaminophen-codeine (TYLENOL #3) 300-30 MG per tablet, Take 1-2 tablets by mouth every 4 (four) hours as needed for moderate pain., Disp: 30 tablet, Rfl: 0 .  albuterol (PROVENTIL) (2.5 MG/3ML) 0.083%  nebulizer solution, Take 3 mLs (2.5 mg total) by nebulization every 6 (six) hours as needed for wheezing or shortness of breath., Disp: 75 mL, Rfl: 12 .  amoxicillin-clavulanate (AUGMENTIN) 875-125 MG per tablet, Take 1 tablet by mouth every 12 (twelve) hours., Disp: 5 tablet, Rfl: 0 .  dextromethorphan-guaiFENesin (MUCINEX DM) 30-600 MG per 12 hr tablet, Take 1 tablet by mouth 2 (two) times daily as needed for cough., Disp: 30 tablet, Rfl: 0 .  EPIPEN 2-PAK 0.3 MG/0.3ML SOAJ injection, See admin instructions., Disp: , Rfl: 1 .  folic acid (FOLVITE) 1 MG tablet, Take 1 tablet (1 mg total) by mouth daily., Disp: 30 tablet, Rfl: 3 .  furosemide (LASIX) 40 MG tablet, Take 40 mg by mouth every morning. , Disp: , Rfl: 11 .  ibuprofen (ADVIL,MOTRIN) 200 MG tablet, Take 400 mg by mouth every 6 (six) hours as needed (Pain)., Disp: , Rfl:  .  lisinopril (PRINIVIL,ZESTRIL) 10 MG tablet, Take 1 tablet (10 mg total) by mouth daily., Disp: 30 tablet, Rfl: 0 .  metoprolol succinate (TOPROL-XL) 50 MG 24 hr tablet, Take 50 mg by mouth daily after lunch. , Disp: , Rfl: 12 .  oxyCODONE (OXY IR/ROXICODONE) 5 MG immediate release tablet, Take 1 tablet (5 mg total) by mouth every 4 (four) hours as  needed for moderate pain., Disp: 30 tablet, Rfl: 0 .  potassium chloride SA (K-DUR,KLOR-CON) 20 MEQ tablet, Take 40 mEq by mouth daily. , Disp: , Rfl:  .  PROAIR HFA 108 (90 BASE) MCG/ACT inhaler, Inhale 1 each into the lungs 2 (two) times daily., Disp: , Rfl: 3 .  spironolactone (ALDACTONE) 25 MG tablet, Take 25 mg by mouth daily after lunch., Disp: , Rfl:  .  UNABLE TO FIND, Med Name: Oxygen concentrator.  Per medical necessity pt needs portable oxygen concentrator via nasal cannula with activity., Disp: 1 each, Rfl: 1 .  mometasone-formoterol (DULERA) 200-5 MCG/ACT AERO, Take 2 puffs first thing in am and then another 2 puffs about 12 hours later. (Patient not taking: Reported on 07/19/2015), Disp: 1 Inhaler, Rfl: 11  Filed  Vitals:   07/19/15 1057  BP: 113/82  Pulse: 85  Temp: 97.1 F (36.2 C)  Resp: 18  Height: 6' (1.829 m)  Weight: 152 lb (68.947 kg)  SpO2: 89%    Body mass index is 20.61 kg/(m^2).       On physical exam he does have minimal swelling in his right leg. His does have some cyanotic changes in his leg. He does have a 2+ dorsalis pedis pulse.  I reviewed his venous duplex with patient. This does show some reflux in his great and small saphenous vein on the right. He does have gross reflux in the femoral vein and popliteal vein as well. He does have slight dilatation of his saphenous vein.  Impression and plan history of swelling related to venous hypertension in his right leg. Has been very compliant with compression of elevation and this is resolved the swelling. I did explain that he does have some superficial venous reflux and this could be corrected corrected with laser ablation. He does have a great deal of deep venous reflux as well and I feel this is the majority of his issue regarding the swelling. I feel that he would have minimal benefit from ablation of his superficial veins and recommended against this. He will continue his compression garments. Will notify should he develop any new difficulty.

## 2015-07-20 ENCOUNTER — Ambulatory Visit (INDEPENDENT_AMBULATORY_CARE_PROVIDER_SITE_OTHER): Payer: BLUE CROSS/BLUE SHIELD | Admitting: Internal Medicine

## 2015-07-20 ENCOUNTER — Telehealth: Payer: Self-pay | Admitting: *Deleted

## 2015-07-20 ENCOUNTER — Ambulatory Visit (INDEPENDENT_AMBULATORY_CARE_PROVIDER_SITE_OTHER)
Admission: RE | Admit: 2015-07-20 | Discharge: 2015-07-20 | Disposition: A | Discharge status: Home / Self Care | Payer: BLUE CROSS/BLUE SHIELD | Source: Ambulatory Visit | Attending: Internal Medicine | Admitting: Internal Medicine

## 2015-07-20 ENCOUNTER — Encounter: Payer: Self-pay | Admitting: Internal Medicine

## 2015-07-20 VITALS — BP 132/88 | HR 86 | Ht 72.0 in | Wt 157.0 lb

## 2015-07-20 DIAGNOSIS — J90 Pleural effusion, not elsewhere classified: Secondary | ICD-10-CM

## 2015-07-20 DIAGNOSIS — Z720 Tobacco use: Secondary | ICD-10-CM | POA: Diagnosis not present

## 2015-07-20 DIAGNOSIS — F17210 Nicotine dependence, cigarettes, uncomplicated: Secondary | ICD-10-CM

## 2015-07-20 DIAGNOSIS — J449 Chronic obstructive pulmonary disease, unspecified: Secondary | ICD-10-CM | POA: Diagnosis not present

## 2015-07-20 DIAGNOSIS — J9612 Chronic respiratory failure with hypercapnia: Secondary | ICD-10-CM

## 2015-07-20 DIAGNOSIS — I10 Essential (primary) hypertension: Secondary | ICD-10-CM

## 2015-07-20 DIAGNOSIS — J948 Other specified pleural conditions: Secondary | ICD-10-CM | POA: Diagnosis not present

## 2015-07-20 MED ORDER — VALSARTAN 160 MG PO TABS: 160.0000 mg | ORAL_TABLET | Freq: Every day | ORAL | Status: DC

## 2015-07-20 MED ORDER — PREDNISONE 10 MG PO TABS: ORAL_TABLET | ORAL | Status: DC

## 2015-07-20 NOTE — Assessment & Plan Note (Signed)
Adequate control on present rx, reviewed > no change in rx needed  = 2lpm 24/7  

## 2015-07-20 NOTE — Progress Notes (Signed)
Subjective:    Patient ID: Henry [name], male    DOB: 1950-11-19,    MRN: [id number]  HPI  35 yowm active smoker dx lung ca summer 2015 some better p talc pleurodesis but downhill since Winter 2016 but varaibly sob so referred to pulmonary 07/03/2015   by Dr Alroy Dust  Last oncology update 8///5/16 DIAGNOSIS: Metastatic adenocarcinoma of unknown primary but questionable for upper gastrointestinal, pancreaticobiliary or primary lung cancer, diagnosed in August of 2015.  Genomic Alterations Identified? FGFR2 A97T KRAS Q61H - subclonal? ARID1A [id number]* - subclonal?  PRIOR THERAPY:  1) Status post right video-assisted thoracoscopy, drainage of pleural effusion, pleural biopsy, partial decortication, talc pleurodesis.under the care of Dr. Roxan Hockey on 06/23/2014. 2) Systemic chemotherapy with carboplatin for AUC of 5 and Alimta 500 mg/M2 every 3 weeks. He is status post 6 cycles, with stable disease after cycle #6.  CURRENT THERAPY: Maintenance chemotherapy with single agent Alimta 500 MG/M2 every 3 weeks. First dose 12/20/2014. He is status post 8 cycles.   07/03/2015 1st [facility] office visit/ [facility]   Chief Complaint  Patient presents with  . Pulmonary Consult    Referred by Dr Marcello Moores. Pt c/o increased SOB with exertion, productive cough with thick mucus sometimes blood tinged, wheezing, and chest pain x 1 month   can still walk HT aisles on slowpace and no 02  / struggles to get up from garage to kitchen door but can do s stopping = MMRC 2  Albuterol helps some  Can't lie on L side / better flat on back with 2 pillows under head/ chronic  swelling in both legs  Just finish clindamycin didn't help cough much still variably yellow/ bloody mucus esp each am Cp is diffuse/ ant made worse with coughing x one month  rec Try dulera 200 Take 2 puffs first thing in am and then another 2 puffs about 12 hours later.  Only use your albuterol as a rescue medication   The key is to stop  smoking completely before smoking completely stops you!    Admit date: 07/11/2015 Discharge date: 07/15/2015  Time spent: 25 minutes  Recommendations for Outpatient Follow-up:  1. Medication change: Verapamil discontinued 2. New medication: Lisinopril 10 mg by mouth daily  3. New medication: Augmentin 500 mg by mouth twice a day 2 more days 4. New medication: Mucinex DM 1 tablespoon by mouth twice a day when necessary for cough  Discharge Diagnoses:  Active Hospital Problems   Diagnosis Date Noted  . Pleural effusion, left 07/11/2015  . Chronic diastolic heart failure 69/48/5462  . Chronic respiratory failure with hypoxia 07/12/2015  . HCAP (healthcare-associated pneumonia) 07/11/2015  . Metastatic adenocarcinoma of unknown origin   . Cigarette smoker 07/04/2015  . Lung cancer 06/22/2014  . HTN (hypertension) 01/31/2013  . Hemochromatosis 01/31/2013    Resolved Hospital Problems   Diagnosis Date Noted Date Resolved  No resolved problems to display.   Diet recommendation: Heart healthy  Filed Weights   07/11/15 2250 07/14/15 1403  Weight: 69.899 kg (154 lb 1.6 oz) 70.489 kg (155 lb 6.4 oz)    History of present illness:  Patient is a 64 year old male with past history of metastatic adenocarcinoma of unknown primary to the lungs along with chronic respiratory failure on 2 L nasal cannula who in the last month underwent a right pleurodesis for recurrent pleural effusion and was admitted on 8/24 for progressively worsening shortness of breath and found to have a large left pleural effusion.  Hospital Course:  Principal Problem:  Pleural effusion, left: Seen by interventional radiology. Underwent thoracentesis yielding 1 L of fluid. No evidence of infection. No empyema. Final cytology pending, but no malignant cells noted. Active Problems:  HTN (hypertension): Verapamil will be discontinued in place of lisinopril, started for  core measures for diastolic heart failure. See below.    Cigarette smoker: Nicotine patch  HCAP (healthcare-associated pneumonia): Initially on broad-spectrum IV antibiotics, changed over to by mouth Augmentin, prescription given for several more days to complete 7 day course.   Metastatic adenocarcinoma of unknown origin: Will follow-up with Dr. Julien Nordmann, oncology Acute on Chronic respiratory failure with hypoxia: multifactorial secondary to CHF, pleural effusions, COPD and lung cancer. He improved with diuresis.   Acute on diastolic chronic heart failure: Despite thoracentesis and paracentesis, patient's breathing did not feel much better. He was able to maintain oxygen saturations on 2 L nasal cannula. Repeat chest x-ray noted increased pleural effusion following thoracentesis a few days prior, however looked more consistent with CHF. BNP checked and found to be normal. Patient started on IV Lasix and diuresed several liters. Echocardiogram found to have grade 1 diastolic dysfunction. Patient clinically feeling better. Given information about heart fire. Outpatient follow-up with his PCP. No beta blocker due to COPD issues. Will substitute his ACE inhibitor for verapamil for core measures.  Ascites: Seen on previous CT. Lispro oncology recognitions, patient underwent paracentesis by interventional radiology. 600 ML's removed. No signs of peritonitis.  Consultants:  Case discussed with pulmonary  Interventional radiology  Procedures:  Status post 1 L of fluid from thoracentesis done 8/24 > pos adenoca  status post 600 ml of fluid from paracentesis done 8/26> pos adenoca  2-D echo done 1/61: Grade 1 diastolic dysfunction      07/20/2015 f/u ov/[facility] re: chronic 02 dep resp failure/met adenoca to L pleura and peritoneal fluid/ftt  Chief Complaint  Patient presents with  . Acute Visit    Pt c/o increased DOE and cough since last visit. He states that he gets SOB with walking approx 2  ft. He is using albuterol inhaler approx 3 x per day and neb 3 x per day on average.     Body mass index is 21.29 kg/(m^2).   Did not bring meds as requested. He is still comfortable sitting upright on 2 L and only using Tylenol 3 maybe one per day for cough. He did not feel that he will help him but prefers the nebulizer with albuterol which helps his breathing some. Still, on his best days he cannot get across the room without becoming short of breath even on oxygen with adequate sats documented  No obvious day to day or daytime variability or assoc chronic cough or cp or chest tightness, subjective wheeze or overt sinus or hb symptoms. No unusual exp hx or h/o childhood pna/ asthma or knowledge of premature birth.  Sleeping ok without nocturnal  or early am exacerbation  of respiratory  c/o's or need for noct saba. Also denies any obvious fluctuation of symptoms with weather or environmental changes or other aggravating or alleviating factors except as outlined above   Current Medications, Allergies, Complete Past Medical History, Past Surgical History, Family History, and Social History were reviewed in [employer] record.  ROS  The following are not active complaints unless bolded sore throat, dysphagia, dental problems, itching, sneezing,  nasal congestion or excess/ purulent secretions, ear ache,   fever, chills, sweats, unintended wt loss, classically pleuritic or  exertional cp, hemoptysis,  orthopnea pnd or leg swelling, presyncope, palpitations, abdominal pain, anorexia, nausea, vomiting, diarrhea  or change in bowel or bladder habits, change in stools or urine, dysuria,hematuria,  rash, arthralgias, visual complaints, headache, numbness, weakness or ataxia or problems with walking or coordination,  change in mood/affect or memory.              Objective:   Physical Exam   amb wm nad with unusual affect / sats 95% on 2lpm POC  07/20/2015          157 Wt  Readings from Last 3 Encounters:  07/03/15 157 lb 3.2 oz (71.305 kg)  06/28/15 157 lb (71.215 kg)  06/27/15 154 lb (69.854 kg)    Vital signs reviewed   HEENT: nl dentition, turbinates, and orophanx. Nl external ear canals without cough reflex   NECK :  without JVD/Nodes/TM/ nl carotid upstrokes bilaterally   LUNGS: no acc muscle use,  / decreased both bases/ min exp rhonchi bilaterally   CV:  RRR  no s3 or murmur or increase in P2,  1+ edema L > R   ABD:  soft and nontender with nl excursion in the supine position. No bruits or organomegaly, bowel sounds nl  MS:  warm without deformities, calf tenderness, cyanosis or clubbing  SKIN: warm and dry without lesions    NEURO:  alert, approp, no deficits     I personally reviewed images and agree with radiology impression as follows:  cxr 07/20/2015 Left pleural effusion is only slightly worse than his last study done 2 days after thoracentesis on the left.       Assessment & Plan:

## 2015-07-20 NOTE — Assessment & Plan Note (Signed)
ACE inhibitors are problematic in  pts with airway complaints because  even experienced pulmonologists can't always distinguish ace effects from copd/asthma/pnds/ allergies etc.  By themselves they don't actually cause a problem, much like oxygen can't by itself start a fire, but they certainly serve as a powerful catalyst or enhancer for any "fire"  or inflammatory process in the upper airway, be it caused by an ET  tube or more commonly reflux (especially in the obese or pts with known GERD or who are on biphoshonates) or URI's, due to interference with bradykinin clearance.  The effects of acei on bradykinin levels occurs in 100% of pt's on acei (unless they surreptitiously stop the med!) but the classic cough is only reported in 5%.  This leaves 95% of pts on acei's  with a variety of syndromes including no identifiable symptom in most  vs non-specific symptoms that wax and wane depending on what other insult is occuring at the level of the upper airway. Given the difficulty inherent in sorting out the various syndromes of acei that mimic COPD/ asthma and the symptoms of lung cancer I would avoid ACE inhibitor is entirely in this individual.  rec trial of diova 160 mg daily or his insurance preferred arb

## 2015-07-20 NOTE — Assessment & Plan Note (Signed)
>   3 m  I took an extended  opportunity with this patient to outline the consequences of continued cigarette use  in airway disorders based on all the data we have from the multiple national lung health studies (perfomed over decades at millions of dollars in cost)  indicating that smoking cessation, not choice of inhalers or physicians, is the most important aspect of care.   

## 2015-07-20 NOTE — Patient Instructions (Addendum)
Prednisone 10 mg take  4 each am x 2 days,   2 each am x 2 days,  1 each am x 2 days and stop   Only use your albuterol(proair= RED inhaler)as a rescue medication to be used if you can't catch your breath by resting or doing a relaxed purse lip breathing pattern.  - The less you use it, the better it will work when you need it. - Ok to use up to 2 puffs  every 4 hours if you must but call for immediate appointment if use goes up over your usual need - Don't leave home without it !!  (think of it like the spare tire for your car)   Only use nebulizer if you use proair first and it doesn't work - ok to use it up to every 4 hours  If you must   Stop lisinopril and take valsartan 80 mg daily instead  -  Call if your insurance won't pay for it.   If not comfortable at rest go to ER   If pain shortness or breath or cough > Tylenol #3 from one half to 2 evey 4 hours if needed  Will defer to mohammed how to manage the fluid in your lungs because it's from your cancer   Please remember to go to the  x-ray department downstairs for your tests - we will call you with the results when they are available.  Follow up here is as needed - we can cancel the pfts

## 2015-07-20 NOTE — Progress Notes (Signed)
Quick Note:  Spoke with pt and notified of results per Dr. Wert. Pt verbalized understanding and denied any questions.  ______ 

## 2015-07-20 NOTE — Telephone Encounter (Signed)
Pt called with concerns about treatment plan going forward. 1. What are the scan results 2. Request to move appt to sooner than 07/27/15 and what is the plan going forward. 4. I can't take more than 2 steps without not being able to breath. Pt confirms he saw his pulmonologist. 5. I cant move without being exhausted, pt denied using a walker that he has access to. Discussed with pt i will give his concerns to MD for review. POF to scheduling to r/s for sooner appt

## 2015-07-20 NOTE — Assessment & Plan Note (Signed)
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 Of greater concern to me is the fact that he really did not notice much benefit from having the fluid removed and I would only remove it again if he becomes uncomfortable at rest on oxygen. 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 Julien Nordmann coordinate this with thoracic surgery. In the meantime if he is not comfortable at rest sitting he could return to the emergency room to have the fluid removed again on a temporary basis but warned him that the more frequently this is required the more likely he will have a complication such as a lung injury from the procedure or precipitation of renal failure from volume depletion.

## 2015-07-20 NOTE — Assessment & Plan Note (Signed)
07/03/2015  try dulera 200 2bid > preferred saba - 07/20/2015 p extensive coaching HFA effectiveness =    75% from a baseline of < 50%   I really don't believe there is much COPD here. He may have an asthmatic component however that needs to be addressed more aggressively so I recommended a 6 day course of prednisone but that is all for now.  I had an extended discussion with the patient reviewing all relevant studies completed to date and  Lasting 25  minutes of a 40  minute visit    Each maintenance medication was reviewed in detail including most importantly the difference between maintenance and prns and under what circumstances the prns are to be triggered using an action plan format that is not reflected in the computer generated alphabetically organized AVS.    Please see instructions for details which were reviewed in writing and the patient given a copy highlighting the part that I personally wrote and discussed at today's ov.

## 2015-07-22 NOTE — Telephone Encounter (Signed)
OK to see him sooner. I discussed the scan with him when he was in the hospital. Dyspnea and fagiue are related to his cancer.

## 2015-07-24 ENCOUNTER — Ambulatory Visit: Payer: BLUE CROSS/BLUE SHIELD

## 2015-07-25 ENCOUNTER — Ambulatory Visit (HOSPITAL_COMMUNITY)
Admission: RE | Admit: 2015-07-25 | Payer: BLUE CROSS/BLUE SHIELD | Source: Ambulatory Visit | Admitting: Gastroenterology

## 2015-07-25 ENCOUNTER — Telehealth: Payer: Self-pay | Admitting: *Deleted

## 2015-07-25 ENCOUNTER — Encounter (HOSPITAL_COMMUNITY): Admission: RE | Payer: Self-pay | Source: Ambulatory Visit

## 2015-07-25 ENCOUNTER — Telehealth: Payer: Self-pay | Admitting: Internal Medicine

## 2015-07-25 HISTORY — DX: Pleural effusion, not elsewhere classified: J90

## 2015-07-25 SURGERY — COLONOSCOPY WITH PROPOFOL
Anesthesia: Monitor Anesthesia Care

## 2015-07-25 NOTE — Telephone Encounter (Signed)
Per desk nurse added lab/MM for 9/8 @ 9:30 am lab and 10 am MM - cxd 9/9 appointments per desk nurse due to patient being seen tomorrow and tx being changed. Per desk nurse patient aware.

## 2015-07-25 NOTE — Telephone Encounter (Signed)
Called pt per [id number] pt and MD will have a discussion regarding treatment options. MD advised pt can be seen 9/8 at 10am with labs prior as he is out of office on Friday 9/9. Pt will not have treatment on Friday 9/9 as chemotherapy will need prior authorization and this will take up to one week  Pt verbalized understanding and confirmed appt with MD 9/8 10am, labs at 930. No further concerns. POF to scheduling.

## 2015-07-26 ENCOUNTER — Encounter: Payer: Self-pay | Admitting: Internal Medicine

## 2015-07-26 ENCOUNTER — Ambulatory Visit (HOSPITAL_BASED_OUTPATIENT_CLINIC_OR_DEPARTMENT_OTHER): Payer: BLUE CROSS/BLUE SHIELD | Admitting: Internal Medicine

## 2015-07-26 ENCOUNTER — Other Ambulatory Visit: Payer: Self-pay | Admitting: Medical Oncology

## 2015-07-26 ENCOUNTER — Ambulatory Visit (HOSPITAL_COMMUNITY)
Admission: RE | Admit: 2015-07-26 | Discharge: 2015-07-26 | Disposition: A | Discharge status: Home / Self Care | Payer: BLUE CROSS/BLUE SHIELD | Source: Ambulatory Visit | Attending: Internal Medicine | Admitting: Internal Medicine

## 2015-07-26 ENCOUNTER — Telehealth: Payer: Self-pay | Admitting: Medical Oncology

## 2015-07-26 ENCOUNTER — Telehealth: Payer: Self-pay | Admitting: Internal Medicine

## 2015-07-26 ENCOUNTER — Other Ambulatory Visit (HOSPITAL_BASED_OUTPATIENT_CLINIC_OR_DEPARTMENT_OTHER): Payer: BLUE CROSS/BLUE SHIELD

## 2015-07-26 VITALS — BP 91/64 | HR 79 | Temp 97.7°F | Resp 18 | Ht 72.0 in | Wt 156.8 lb

## 2015-07-26 DIAGNOSIS — C3490 Malignant neoplasm of unspecified part of unspecified bronchus or lung: Secondary | ICD-10-CM

## 2015-07-26 DIAGNOSIS — C3491 Malignant neoplasm of unspecified part of right bronchus or lung: Secondary | ICD-10-CM | POA: Diagnosis not present

## 2015-07-26 DIAGNOSIS — R180 Malignant ascites: Secondary | ICD-10-CM

## 2015-07-26 DIAGNOSIS — C799 Secondary malignant neoplasm of unspecified site: Secondary | ICD-10-CM

## 2015-07-26 DIAGNOSIS — J90 Pleural effusion, not elsewhere classified: Secondary | ICD-10-CM | POA: Diagnosis not present

## 2015-07-26 DIAGNOSIS — R0602 Shortness of breath: Secondary | ICD-10-CM

## 2015-07-26 DIAGNOSIS — C3411 Malignant neoplasm of upper lobe, right bronchus or lung: Secondary | ICD-10-CM

## 2015-07-26 DIAGNOSIS — J984 Other disorders of lung: Secondary | ICD-10-CM | POA: Diagnosis not present

## 2015-07-26 DIAGNOSIS — C801 Malignant (primary) neoplasm, unspecified: Secondary | ICD-10-CM

## 2015-07-26 LAB — COMPREHENSIVE METABOLIC PANEL (CC13)
ALT: 19 U/L (ref 0–55)
ANION GAP: 7 meq/L (ref 3–11)
AST: 23 U/L (ref 5–34)
Albumin: 3 g/dL — ABNORMAL LOW (ref 3.5–5.0)
Alkaline Phosphatase: 70 U/L (ref 40–150)
BUN: 26 mg/dL (ref 7.0–26.0)
CHLORIDE: 96 meq/L — AB (ref 98–109)
CO2: 37 meq/L — AB (ref 22–29)
CREATININE: 1 mg/dL (ref 0.7–1.3)
Calcium: 9.4 mg/dL (ref 8.4–10.4)
EGFR: 75 mL/min/{1.73_m2} — ABNORMAL LOW (ref >90)
Glucose: 163 mg/dl — ABNORMAL HIGH (ref 70–140)
Potassium: 3.9 mEq/L (ref 3.5–5.1)
SODIUM: 140 meq/L (ref 136–145)
Total Bilirubin: 0.74 mg/dL (ref 0.20–1.20)
Total Protein: 6.5 g/dL (ref 6.4–8.3)

## 2015-07-26 LAB — CBC WITH DIFFERENTIAL/PLATELET
BASO%: 0.4 % (ref 0.0–2.0)
Basophils Absolute: 0 10*3/uL (ref 0.0–0.1)
EOS%: 1.2 % (ref 0.0–7.0)
Eosinophils Absolute: 0.1 10*3/uL (ref 0.0–0.5)
HCT: 39.1 % (ref 38.4–49.9)
HGB: 13 g/dL (ref 13.0–17.1)
LYMPH%: 13.1 % — AB (ref 14.0–49.0)
MCH: 34.5 pg — ABNORMAL HIGH (ref 27.2–33.4)
MCHC: 33.2 g/dL (ref 32.0–36.0)
MCV: 103.7 fL — ABNORMAL HIGH (ref 79.3–98.0)
MONO#: 0.9 10*3/uL (ref 0.1–0.9)
MONO%: 8.2 % (ref 0.0–14.0)
NEUT#: 8 10*3/uL — ABNORMAL HIGH (ref 1.5–6.5)
NEUT%: 77.1 % — AB (ref 39.0–75.0)
PLATELETS: 323 10*3/uL (ref 140–400)
RBC: 3.77 10*6/uL — AB (ref 4.20–5.82)
RDW: 15.2 % — ABNORMAL HIGH (ref 11.0–14.6)
WBC: 10.4 10*3/uL — ABNORMAL HIGH (ref 4.0–10.3)
lymph#: 1.4 10*3/uL (ref 0.9–3.3)

## 2015-07-26 MED ORDER — PREDNISONE 10 MG PO TABS: 10.0000 mg | ORAL_TABLET | Freq: Every day | ORAL | Status: AC

## 2015-07-26 MED ORDER — MORPHINE SULFATE 15 MG PO TABS: 15.0000 mg | ORAL_TABLET | ORAL | Status: AC | PRN

## 2015-07-26 NOTE — Progress Notes (Signed)
Home [phone]   [phone]  OFFICE PROGRESS NOTE  Donnie Coffin, [place] [address] [place] [address]  DIAGNOSIS: Metastatic adenocarcinoma of unknown primary but questionable for upper gastrointestinal, pancreaticobiliary or primary lung cancer, diagnosed in August of 2015.  Genomic Alterations Identified? FGFR2 A97T KRAS Q61H - subclonal? ARID1A [id number]* - subclonal?  PRIOR THERAPY:  1) Status post right video-assisted thoracoscopy, drainage of pleural effusion, pleural biopsy, partial decortication, talc pleurodesis.under the care of Dr. Roxan Hockey on 06/23/2014. 2) Systemic chemotherapy with carboplatin for AUC of 5 and Alimta 500 mg/M2 every 3 weeks. He is status post 6 cycles, with stable disease after cycle #6. 3) Maintenance chemotherapy with single agent Alimta 500 MG/M2 every 3 weeks. First dose 12/20/2014. He is status post 9 cycles discontinued secondary to disease progression.  CURRENT THERAPY: Second line treatment with immunotherapy with Nivolumab 3 MG/KG every 2 weeks. First dose expected on 08/01/2015.  INTERVAL HISTORY: Henry [name] 64 y.o. male returns to the clinic today on a wheelchair for follow-up visit accompanied by his sister. The patient has been on treatment with maintenance chemotherapy with single agent Alimta status post 9 cycles and was tolerating his treatment fairly well. He was recently admitted to [facility] complaining of progressive shortness of breath and repeat imaging studies including CT scan of the chest, abdomen and pelvis on 07/10/2015 showed persistent right-sided loculated pleural effusion collection and evidence of prior talc pleurodesis there was also persistent right basilar air space consolidation and a stable bilobed masslike density in the right middle lobe and right lower lobe. There was new large left pleural effusion with areas of probable location but no  obvious enhancing pleural nodules. 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 There is a stable nodular enlargement of both adrenal glands and enlarging soft tissue masses involving the ascending colon questionable for adenoma or metastasis. On 07/12/2015 the patient underwent ultrasound-guided thoracentesis by interventional radiology was drainage of 1 L off Toprol dark bloody fluid. The final cytology showed malignant cells consistent with metastatic adenocarcinoma. On 07/13/2015 he also underwent ultrasound-guided paracentesis with drainage of 600 ML of peritoneal fluid and the final cytology was also consistent with metastatic adenocarcinoma. The patient came today for evaluation and discussion of his treatment options based on the new findings. He continued to have shortness of breath and currently on home oxygen. The patient denied having any significant chest pain but has mild cough. He denied having any hemoptysis. He has no nausea or vomiting. No significant weight loss or night sweats. He has no fever or chills.  MEDICAL HISTORY: Past Medical History  Diagnosis Date  . Hypertension   . GERD (gastroesophageal reflux disease)   . Hemochromatosis   . Hx of adenomatous colonic polyps   . Umbilical hernia     none since weight loss  . Diverticulosis   . Lumbar spondylosis     ankle  . Pleural effusion on right 12/16/13    per chest xray  . Shortness of breath     with  exertion  . Chronic pancreatitis   . IBS (irritable bowel syndrome)   . Lung cancer 06/2014  . 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 Pleural effusion 07-11-15    ALLERGIES:  is allergic to other.  MEDICATIONS:  Current Outpatient Prescriptions  Medication Sig Dispense Refill  .  albuterol (PROVENTIL) (2.5 MG/3ML) 0.083% nebulizer solution Take 3 mLs (2.5 mg total) by  nebulization every 6 (six) hours as needed for wheezing or shortness of breath. 75 mL 12  . dextromethorphan-guaiFENesin (MUCINEX DM) 30-600 MG per 12 hr tablet Take 1 tablet by mouth 2 (two) times daily as needed for cough. 30 tablet 0  . DULERA 200-5 MCG/ACT AERO Inhale 1 puff into the lungs 2 (two) times daily.  10  . folic acid (FOLVITE) 1 MG tablet Take 1 tablet (1 mg total) by mouth daily. 30 tablet 3  . furosemide (LASIX) 40 MG tablet Take 40 mg by mouth every morning.   11  . KLOR-CON M10 10 MEQ tablet Take 10 mEq by mouth 2 (two) times daily.  4  . metoprolol succinate (TOPROL-XL) 50 MG 24 hr tablet Take 50 mg by mouth daily after lunch.   12  . oxyCODONE (OXY IR/ROXICODONE) 5 MG immediate release tablet Take 5 mg by mouth every 4 (four) hours as needed.  0  . PROAIR HFA 108 (90 BASE) MCG/ACT inhaler Inhale 1 each into the lungs 2 (two) times daily.  3  . spironolactone (ALDACTONE) 25 MG tablet Take 25 mg by mouth daily after lunch.    . tamsulosin (FLOMAX) 0.4 MG CAPS capsule Take 0.4 mg by mouth daily.  [facility] Name: Oxygen concentrator.  Per medical necessity pt needs portable oxygen concentrator via nasal cannula with activity. 1 each 1  . valsartan (DIOVAN) 160 MG tablet Take 1 tablet (160 mg total) by mouth daily. 30 tablet 11  . verapamil (VERELAN PM) 240 MG 24 hr capsule Take 240 mg by mouth daily.  11  . acetaminophen-codeine (TYLENOL #3) 300-30 MG per tablet Take 1-2 tablets by mouth every 4 (four) hours as needed for moderate pain. (Patient not taking: Reported on 07/26/2015) 30 tablet 0  . EPIPEN 2-PAK 0.3 MG/0.3ML SOAJ injection See admin instructions.  1  . ibuprofen (ADVIL,MOTRIN) 200 MG tablet Take 400 mg by mouth every 6 (six) hours as needed (Pain).     No current facility-administered medications for this visit.    SURGICAL HISTORY:  Past Surgical History  Procedure Laterality Date  . Lumbar laminectomy  1983  . Knee arthrsoscopy  2003    left    . Tonsillectomy  1962  . Eus N/A 04/07/2013    Procedure: UPPER ENDOSCOPIC ULTRASOUND (EUS) LINEAR;  Surgeon: [name], MD;  Location: [facility];  Service: Endoscopy;  Laterality: N/A;  . Video assisted thoracoscopy (vats)/decortication Right 06/22/2014    Procedure: VIDEO ASSISTED THORACOSCOPY (VATS)/DECORTICATION;  Surgeon: [name], MD;  Location: [facility];  Service: Thoracic;  Laterality: Right;  . Pleural effusion drainage Right 06/22/2014    Procedure: DRAINAGE OF PLEURAL EFFUSION;  Surgeon: [name], MD;  Location: [facility];  Service: Thoracic;  Laterality: Right;  . Eus N/A 07/20/2014    Procedure: UPPER ENDOSCOPIC ULTRASOUND (EUS) LINEAR;  Surgeon: [name], MD;  Location: [facility];  Service: Endoscopy;  Laterality: N/A;    REVIEW OF SYSTEMS:  Constitutional: positive for anorexia, fatigue and malaise Eyes: negative Ears, nose, mouth, throat, and face: negative Respiratory: positive for cough, dyspnea on exertion, sputum and wheezing Cardiovascular: negative Gastrointestinal: negative Genitourinary:negative Integument/breast: negative Hematologic/lymphatic: negative Musculoskeletal:positive for muscle weakness Neurological: negative Behavioral/Psych: negative Endocrine: negative Allergic/Immunologic: negative   PHYSICAL EXAMINATION: General appearance: alert, cooperative and no distress Head: Normocephalic, without obvious abnormality, atraumatic Neck: no adenopathy,  no JVD, supple, symmetrical, trachea midline and thyroid not enlarged, symmetric, no tenderness/mass/nodules Lymph nodes: Cervical, supraclavicular, and axillary nodes normal. Resp: clear to auscultation bilaterally Back: symmetric, no curvature. ROM normal. No CVA tenderness. Cardio: regular rate and rhythm, S1, S2 normal, no murmur, click, rub or gallop GI: soft, non-tender; bowel sounds normal; no masses,  no organomegaly Extremities: edema 1+ bilaterally Neurologic: Alert  and oriented X 3, normal strength and tone. Normal symmetric reflexes. Normal coordination and gait  ECOG PERFORMANCE STATUS: 2 - Symptomatic, <50% confined to bed  Blood pressure 91/64, pulse 79, temperature 97.7 F (36.5 C), temperature source Oral, resp. rate 18, height 6' (1.829 m), weight 156 lb 12.8 oz (71.124 kg), SpO2 100 %.  LABORATORY DATA: Lab Results  Component Value Date   WBC 10.4* 07/26/2015   HGB 13.0 07/26/2015   HCT 39.1 07/26/2015   MCV 103.7* 07/26/2015   PLT 323 07/26/2015      Chemistry      Component Value Date/Time   NA 140 07/26/2015 1009   NA 139 07/15/2015 0456   K 3.9 07/26/2015 1009   K 3.1* 07/15/2015 0456   CL 95* 07/15/2015 0456   CO2 37* 07/26/2015 1009   CO2 38* 07/15/2015 0456   BUN 26.0 07/26/2015 1009   BUN 10 07/15/2015 0456   CREATININE 1.0 07/26/2015 1009   CREATININE 0.86 07/15/2015 0456      Component Value Date/Time   CALCIUM 9.4 07/26/2015 1009   CALCIUM 8.5* 07/15/2015 0456   ALKPHOS 70 07/26/2015 1009   ALKPHOS 72 07/11/2015 1912   AST 23 07/26/2015 1009   AST 18 07/11/2015 1912   ALT 19 07/26/2015 1009   ALT 10* 07/11/2015 1912   BILITOT 0.74 07/26/2015 1009   BILITOT 1.1 07/11/2015 1912       RADIOGRAPHIC STUDIES: Dg Chest 1 View  07/12/2015   CLINICAL DATA:  Status post left-sided thoracentesis.  EXAM: CHEST  1 VIEW  COMPARISON:  June 28, 2014.  FINDINGS: Stable cardiomediastinal silhouette. No pneumothorax is noted. Minimal right pleural effusion is noted which is increased compared to prior exam. Right basilar opacity is noted which is stable and consistent with scarring or atelectasis. Mild left pleural effusion is noted in left lung base. Increased left upper lobe opacity is noted concerning for possible pneumonia or atelectasis. Mild central pulmonary vascular congestion is noted.  IMPRESSION: Mild central pulmonary vascular congestion. Minimal right pleural effusion is noted. Increased left upper lobe opacity is  noted concerning for pneumonia or possibly atelectasis. Mild left pleural effusion is noted in left lung base status post thoracentesis. No definite pneumothorax is noted.   Electronically Signed   By: Marijo Conception, M.D.   On: 07/12/2015 10:59   Dg Chest 2 View  07/20/2015   CLINICAL DATA:  Follow-up left pleural effusion, persistent shortness of breath, cough, and congestion. History of lung malignancy, COPD, and previous heavy tobacco use.  EXAM: CHEST  2 VIEW  COMPARISON:  PA and lateral chest x-ray of July 14, 2015  FINDINGS: There is a persistent moderate size left pleural effusion with small right pleural effusion. The pulmonary interstitial markings are slightly less conspicuous today. The cardiac silhouette remains enlarged. The pulmonary vascularity is less engorged. There is stable tortuosity of the descending thoracic aorta. The bony thorax exhibits no acute abnormality.  IMPRESSION: Improvement in CHF with less interstitial edema. Stable moderate size left pleural effusion and small right pleural effusion.   Electronically Signed   By:  David  Martinique M.D.   On: 07/20/2015 13:41   Dg Chest 2 View  07/14/2015   CLINICAL DATA:  Shortness of breath today, recent thoracentesis 2 days ago, still short of breath and not feeling much better, history hypertension, smoking, lung cancer, papillary thyroid cancer, chronic pancreatitis, irritable bowel syndrome  EXAM: CHEST  2 VIEW  COMPARISON:  07/12/2015  FINDINGS: Enlargement of cardiac silhouette with pulmonary vascular congestion.  Bibasilar effusions and atelectasis greater on LEFT increased since previous exam.  Minimal perihilar infiltrates bilaterally likely mild pulmonary edema.  No pneumothorax.  Bones demineralized.  IMPRESSION: Persistent mild CHF with bibasilar effusions atelectasis, increased on LEFT since prior study.   Electronically Signed   By: Lavonia Dana M.D.   On: 07/14/2015 12:57   Dg Chest 2 View  07/11/2015   CLINICAL DATA:  Lung  cancer on chemotherapy, increased shortness of breath for 1 week, fluid on lungs, productive cough for 3 months, hypertension  EXAM: CHEST  2 VIEW  COMPARISON:  CT chest 07/10/2015  FINDINGS: Enlargement of cardiac silhouette.  Tortuous aorta.  Mediastinal contours and pulmonary vascularity normal.  Bibasilar effusions LEFT greater than RIGHT similar to prior CT.  Bibasilar pulmonary opacities may represent infiltrate or atelectasis, little changed from prior CT.  Upper lungs grossly clear.  No pneumothorax.  Bones demineralized.  IMPRESSION: Bibasilar pleural effusions LEFT greater than RIGHT with bibasilar pulmonary opacities that may represent atelectasis or consolidation.  Little interval change.   Electronically Signed   By: Lavonia Dana M.D.   On: 07/11/2015 18:57   Ct Chest W Contrast  07/10/2015   CLINICAL DATA:  Restaging lung cancer. Initial diagnosis 2015. History of right lobectomy.  EXAM: CT CHEST, ABDOMEN, AND PELVIS WITH CONTRAST  TECHNIQUE: Multidetector CT imaging of the chest, abdomen and pelvis was performed following the standard protocol during bolus administration of intravenous contrast.  CONTRAST:  126m OMNIPAQUE IOHEXOL 300 MG/ML  SOLN  COMPARISON:  CT 05/01/2015 and PET-CT 06/30/2014  FINDINGS: CT CHEST FINDINGS  Chest wall: Stable small axillary lymph nodes but no mass or overt adenopathy. The thyroid gland appears Stable. Stable small nodules. No supraclavicular adenopathy. The bony thorax is intact. No obvious metastatic lesions involving the thoracic spine or sternum. No obvious rib lesions. Progressive metastatic disease involving the L1 vertebral body.  Mediastinum: The heart is mildly enlarged but stable. Stable tortuosity, ectasia and calcification of the thoracic aorta. Stable small scattered mediastinal lymph nodes. The esophagus is grossly normal.  Lungs/ pleura: Persistent loculated pleural fluid collection on the right side with density along the pleural surface likely due  to previous talc pleurodesis. Persistent overlying long atelectasis and right basilar airspace consolidation. Stable bilobed masslike densities in the right middle lobe and right lower lobe on image number 42.  There is a new large left pleural effusion with areas of probable loculation. I do not see any obvious enhancing pleural nodules. A left-sided thoracentesis may be useful for therapeutic and diagnostic purposes and to exclude malignant left effusion.  CT ABDOMEN AND PELVIS FINDINGS  Hepatobiliary: Stable scattered hepatic cysts. No findings suspicious for hepatic metastatic disease. The portal vein is patent.  Pancreas: Stable small cystic lesion in the pancreatic head. No inflammation or ductal dilatation.  Spleen: Normal size.  No focal lesions.  Adrenals/Urinary Tract: Bilateral nodular enlargement of both adrenal glands which could reflect nodular hyperplasia. No obvious mass/metastasis. The kidneys are unremarkable and stable.  Stomach/Bowel: The stomach wall is thickened but this is likely  due to under distension. No mass or obstruction. The small bowel is grossly normal. No inflammation or obstruction. Enlarging Masses involving the ascending colon. These could be adenomas or possible metastasis.  Vascular/Lymphatic: Stable tortuosity, ectasia and calcification of the abdominal aorta. The branch vessels are patent. 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 The SMA may also be partially occluded/thrombosed. No mesenteric or retroperitoneal mass or adenopathy. Small scattered lymph nodes are noted.  Other: Progression of abdominal/ pelvic ascites without obvious omental or peritoneal surface implants. The bladder appears normal. No pelvic mass or adenopathy. No inguinal mass or adenopathy.  Musculoskeletal: No significant bony findings in the pelvis. Progressive L1 sclerotic metastatic disease.  IMPRESSION: 1. Persistent right-sided loculated pleural  fluid collection and evidence of prior talc pleurodesis. No obvious enhancing pleural masses. Persistent right basilar airspace consolidation and stable bilobed masslike densities in the right middle lobe and right lower lobe. 2. New large left pleural effusion with areas of probable loculation. No obvious enhancing pleural nodules but there left-sided thoracentesis may be useful for diagnostic and therapeutic purposes. 3. Progressive abdominal/pelvic ascites. 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 4. Stable nodular enlargement of both adrenal glands. 5. Enlarging soft tissue masses involving the ascending colon. These could be adenomas or metastasis. 6. Progressive sclerotic metastatic disease involving the L1 vertebral body. No definite new bone metastasis.   Electronically Signed   By: Marijo Sanes M.D.   On: 07/10/2015 14:28   Ct Abdomen Pelvis W Contrast  07/10/2015   CLINICAL DATA:  Restaging lung cancer. Initial diagnosis 2015. History of right lobectomy.  EXAM: CT CHEST, ABDOMEN, AND PELVIS WITH CONTRAST  TECHNIQUE: Multidetector CT imaging of the chest, abdomen and pelvis was performed following the standard protocol during bolus administration of intravenous contrast.  CONTRAST:  129m OMNIPAQUE IOHEXOL 300 MG/ML  SOLN  COMPARISON:  CT 05/01/2015 and PET-CT 06/30/2014  FINDINGS: CT CHEST FINDINGS  Chest wall: Stable small axillary lymph nodes but no mass or overt adenopathy. The thyroid gland appears Stable. Stable small nodules. No supraclavicular adenopathy. The bony thorax is intact. No obvious metastatic lesions involving the thoracic spine or sternum. No obvious rib lesions. Progressive metastatic disease involving the L1 vertebral body.  Mediastinum: The heart is mildly enlarged but stable. Stable tortuosity, ectasia and calcification of the thoracic aorta. Stable small scattered mediastinal lymph  nodes. The esophagus is grossly normal.  Lungs/ pleura: Persistent loculated pleural fluid collection on the right side with density along the pleural surface likely due to previous talc pleurodesis. Persistent overlying long atelectasis and right basilar airspace consolidation. Stable bilobed masslike densities in the right middle lobe and right lower lobe on image number 42.  There is a new large left pleural effusion with areas of probable loculation. I do not see any obvious enhancing pleural nodules. A left-sided thoracentesis may be useful for therapeutic and diagnostic purposes and to exclude malignant left effusion.  CT ABDOMEN AND PELVIS FINDINGS  Hepatobiliary: Stable scattered hepatic cysts. No findings suspicious for hepatic metastatic disease. The portal vein is patent.  Pancreas: Stable small cystic lesion in the pancreatic head. No inflammation or ductal dilatation.  Spleen: Normal size.  No focal lesions.  Adrenals/Urinary Tract: Bilateral nodular enlargement of both adrenal glands which could reflect nodular hyperplasia. No obvious mass/metastasis. The kidneys are unremarkable and stable.  Stomach/Bowel: The stomach wall is thickened but this is likely due to under  distension. No mass or obstruction. The small bowel is grossly normal. No inflammation or obstruction. Enlarging Masses involving the ascending colon. These could be adenomas or possible metastasis.  Vascular/Lymphatic: Stable tortuosity, ectasia and calcification of the abdominal aorta. The branch vessels are patent. 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 The SMA may also be partially occluded/thrombosed. No mesenteric or retroperitoneal mass or adenopathy. Small scattered lymph nodes are noted.  Other: Progression of abdominal/ pelvic ascites without obvious omental or peritoneal surface implants. The bladder appears normal. No pelvic mass or adenopathy. No inguinal mass or  adenopathy.  Musculoskeletal: No significant bony findings in the pelvis. Progressive L1 sclerotic metastatic disease.  IMPRESSION: 1. Persistent right-sided loculated pleural fluid collection and evidence of prior talc pleurodesis. No obvious enhancing pleural masses. Persistent right basilar airspace consolidation and stable bilobed masslike densities in the right middle lobe and right lower lobe. 2. New large left pleural effusion with areas of probable loculation. No obvious enhancing pleural nodules but there left-sided thoracentesis may be useful for diagnostic and therapeutic purposes. 3. Progressive abdominal/pelvic ascites. 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 4. Stable nodular enlargement of both adrenal glands. 5. Enlarging soft tissue masses involving the ascending colon. These could be adenomas or metastasis. 6. Progressive sclerotic metastatic disease involving the L1 vertebral body. No definite new bone metastasis.   Electronically Signed   By: Marijo Sanes M.D.   On: 07/10/2015 14:28   US Paracentesis  07/13/2015   INDICATION: Ascites, request for diagnostic and therapeutic paracentesis.  EXAM: ULTRASOUND-GUIDED PARACENTESIS  COMPARISON:  None.  MEDICATIONS: None.  COMPLICATIONS: None immediate  TECHNIQUE: Informed written consent was obtained from the patient after a discussion of the risks, benefits and alternatives to treatment. A timeout was performed prior to the initiation of the procedure.  Initial ultrasound scanning demonstrates a small amount of ascites within the right upper abdominal quadrant. The right upper abdomen was prepped and draped in the usual sterile fashion. 1% lidocaine was used for local anesthesia.  Under direct ultrasound guidance, a 19 gauge, 7-cm, Yueh catheter was introduced. An ultrasound image was saved for documentation purposed. The paracentesis was performed.  The catheter was removed and a dressing was applied. The patient tolerated the procedure well without immediate post procedural complication.  FINDINGS: A total of approximately 600 ml of serous fluid was removed. Samples were sent to the laboratory as requested by the clinical team.  IMPRESSION: Successful ultrasound-guided paracentesis yielding 600 ml of peritoneal fluid.  Read By:  Tsosie Billing PA-C   Electronically Signed   By: Jerilynn Mages.  Shick M.D.   On: 07/13/2015 14:39   US Thoracentesis Asp Pleural Space W/img Guide  07/12/2015   INDICATION: Patient with history of lung and thyroid cancers, dyspnea, left pleural effusion. Request is made for diagnostic and therapeutic left thoracentesis.  EXAM: ULTRASOUND GUIDED DIAGNOSTIC AND THERAPEUTIC LEFT THORACENTESIS  COMPARISON:  None.  MEDICATIONS: None  COMPLICATIONS: None immediate  TECHNIQUE: Informed written consent was obtained from the patient after a discussion of the risks, benefits and alternatives to treatment. A timeout was performed prior to the initiation of the procedure.  Initial ultrasound scanning demonstrates a moderate to large left pleural effusion. The lower chest was prepped and draped in the usual sterile fashion. 1% lidocaine was used for local anesthesia.  An ultrasound image was saved for documentation purposes. A 6 Fr Safe-T-Centesis catheter was introduced. The  thoracentesis was performed. The catheter was removed and a dressing was applied. The patient tolerated the procedure well without immediate post procedural complication. The patient was escorted to have an upright chest radiograph.  FINDINGS: A total of approximately 1 liter of turbid, dark bloody fluid was removed. Requested samples were sent to the laboratory.  IMPRESSION: Successful ultrasound-guided diagnostic/therapeutic left sided thoracentesis yielding 1 liter of pleural fluid.  Read by: Rowe Robert, PA-C   Electronically Signed   By: Aletta Edouard M.D.   On: 07/12/2015  11:05   ASSESSMENT AND PLAN: This is a very pleasant 64 years old white male with metastatic adenocarcinoma of unknown primary questionable for upper gastrointestinal, pancreatic or biliary or primary lung cancer. He completed systemic chemotherapy with carboplatin and Alimta status post 6 cycles and tolerating his treatment fairly well with a stable disease. This was followed by maintenance chemotherapy with single agent Alimta status post 9 cycles. This was discontinued recently secondary to disease progression. I had a lengthy discussion with the patient and his sister about his current disease status and treatment options. I gave the patient the option of palliative care and hospice referral versus consideration of second line treatment with immunotherapy with Nivolumab 3 MG/KG every 2 weeks. I discussed with the patient the adverse effect of the immunotherapy including but not limited to immune mediated pneumonitis, diarrhea, liver, renal, endocrine dysfunction. The patient is interested in proceeding with immunotherapy and he is expected to start the first cycle of this treatment next week. The patient and his sister are also interested in a palliative care consult but not hospice at this point. For the persistent shortness of breath, I will order a chest x-ray to see if the patient has reaccumulation of the pleural fluid and if positive I would consider him for ultrasound-guided left thoracentesis. The patient also has abdominal distention and I will order ultrasound guided therapeutic paracentesis. For pain management the patient was given prescription for morphine sulfate to be used as needed. He would come back for follow-up visit in 3 weeks for reevaluation and management of any adverse effect of his treatment before starting cycle #2 of his systemic immunotherapy. He was advised to call immediately if he has any concerning symptoms in the interval. I spent more than 30 minutes face-to-face  with the patient and his sister in counseling about his condition and treatment options out of the total visit time 45 minutes. The patient voices understanding of current disease status and treatment options and is in agreement with the current care plan.  All questions were answered. The patient knows to call the clinic with any problems, questions or concerns. We can certainly see the patient much sooner if necessary.  Disclaimer: This note was dictated with voice recognition software. Similar sounding words can inadvertently be transcribed and may not be corrected upon review.

## 2015-07-26 NOTE — Telephone Encounter (Signed)
Gave adn prnted appt sched and avs fo rpt for Sept and OCT

## 2015-07-26 NOTE — Telephone Encounter (Signed)
Palliative care referral done.

## 2015-07-27 ENCOUNTER — Other Ambulatory Visit: Payer: BLUE CROSS/BLUE SHIELD

## 2015-07-27 ENCOUNTER — Other Ambulatory Visit: Payer: Self-pay | Admitting: Internal Medicine

## 2015-07-27 ENCOUNTER — Telehealth: Payer: Self-pay | Admitting: Internal Medicine

## 2015-07-27 ENCOUNTER — Ambulatory Visit: Payer: BLUE CROSS/BLUE SHIELD | Admitting: Physician Assistant

## 2015-07-27 ENCOUNTER — Ambulatory Visit: Payer: BLUE CROSS/BLUE SHIELD

## 2015-07-27 DIAGNOSIS — C3411 Malignant neoplasm of upper lobe, right bronchus or lung: Secondary | ICD-10-CM

## 2015-07-27 NOTE — Telephone Encounter (Signed)
Patient will be contacted by central radiology scheduling re thoracentesis appointment. Spoke with Trula Ore - order is visible in there WQ. No other orders per 9/9 pof.

## 2015-07-29 ENCOUNTER — Inpatient Hospital Stay (HOSPITAL_COMMUNITY)
Admission: EM | Admit: 2015-07-29 | Discharge: 2015-08-01 | DRG: 312 | Disposition: A | Discharge status: Home Health | Payer: BLUE CROSS/BLUE SHIELD | Attending: Internal Medicine | Admitting: Internal Medicine

## 2015-07-29 ENCOUNTER — Emergency Department (HOSPITAL_COMMUNITY): Payer: BLUE CROSS/BLUE SHIELD

## 2015-07-29 ENCOUNTER — Other Ambulatory Visit: Payer: Self-pay

## 2015-07-29 ENCOUNTER — Encounter (HOSPITAL_COMMUNITY): Payer: Self-pay | Admitting: Emergency Medicine

## 2015-07-29 DIAGNOSIS — Z6820 Body mass index (BMI) 20.0-20.9, adult: Secondary | ICD-10-CM

## 2015-07-29 DIAGNOSIS — Z800 Family history of malignant neoplasm of digestive organs: Secondary | ICD-10-CM | POA: Diagnosis not present

## 2015-07-29 DIAGNOSIS — C3490 Malignant neoplasm of unspecified part of unspecified bronchus or lung: Secondary | ICD-10-CM | POA: Diagnosis present

## 2015-07-29 DIAGNOSIS — R0602 Shortness of breath: Secondary | ICD-10-CM | POA: Diagnosis present

## 2015-07-29 DIAGNOSIS — K589 Irritable bowel syndrome without diarrhea: Secondary | ICD-10-CM | POA: Diagnosis present

## 2015-07-29 DIAGNOSIS — Z8249 Family history of ischemic heart disease and other diseases of the circulatory system: Secondary | ICD-10-CM

## 2015-07-29 DIAGNOSIS — Z9889 Other specified postprocedural states: Secondary | ICD-10-CM

## 2015-07-29 DIAGNOSIS — N179 Acute kidney failure, unspecified: Secondary | ICD-10-CM | POA: Diagnosis present

## 2015-07-29 DIAGNOSIS — Z66 Do not resuscitate: Secondary | ICD-10-CM | POA: Diagnosis present

## 2015-07-29 DIAGNOSIS — I952 Hypotension due to drugs: Principal | ICD-10-CM | POA: Diagnosis present

## 2015-07-29 DIAGNOSIS — M545 Low back pain: Secondary | ICD-10-CM | POA: Diagnosis present

## 2015-07-29 DIAGNOSIS — K219 Gastro-esophageal reflux disease without esophagitis: Secondary | ICD-10-CM | POA: Diagnosis present

## 2015-07-29 DIAGNOSIS — R339 Retention of urine, unspecified: Secondary | ICD-10-CM | POA: Diagnosis present

## 2015-07-29 DIAGNOSIS — I5032 Chronic diastolic (congestive) heart failure: Secondary | ICD-10-CM | POA: Diagnosis present

## 2015-07-29 DIAGNOSIS — Z85850 Personal history of malignant neoplasm of thyroid: Secondary | ICD-10-CM

## 2015-07-29 DIAGNOSIS — D72829 Elevated white blood cell count, unspecified: Secondary | ICD-10-CM | POA: Diagnosis present

## 2015-07-29 DIAGNOSIS — I10 Essential (primary) hypertension: Secondary | ICD-10-CM | POA: Diagnosis present

## 2015-07-29 DIAGNOSIS — Z7952 Long term (current) use of systemic steroids: Secondary | ICD-10-CM

## 2015-07-29 DIAGNOSIS — Z79891 Long term (current) use of opiate analgesic: Secondary | ICD-10-CM | POA: Diagnosis not present

## 2015-07-29 DIAGNOSIS — R531 Weakness: Secondary | ICD-10-CM

## 2015-07-29 DIAGNOSIS — K5900 Constipation, unspecified: Secondary | ICD-10-CM | POA: Diagnosis present

## 2015-07-29 DIAGNOSIS — Z87891 Personal history of nicotine dependence: Secondary | ICD-10-CM | POA: Diagnosis not present

## 2015-07-29 DIAGNOSIS — J90 Pleural effusion, not elsewhere classified: Secondary | ICD-10-CM | POA: Diagnosis present

## 2015-07-29 DIAGNOSIS — R52 Pain, unspecified: Secondary | ICD-10-CM

## 2015-07-29 DIAGNOSIS — Z86010 Personal history of colonic polyps: Secondary | ICD-10-CM

## 2015-07-29 DIAGNOSIS — E43 Unspecified severe protein-calorie malnutrition: Secondary | ICD-10-CM | POA: Diagnosis present

## 2015-07-29 DIAGNOSIS — T465X5A Adverse effect of other antihypertensive drugs, initial encounter: Secondary | ICD-10-CM | POA: Diagnosis present

## 2015-07-29 DIAGNOSIS — Z79899 Other long term (current) drug therapy: Secondary | ICD-10-CM | POA: Diagnosis not present

## 2015-07-29 DIAGNOSIS — K861 Other chronic pancreatitis: Secondary | ICD-10-CM | POA: Diagnosis present

## 2015-07-29 DIAGNOSIS — J910 Malignant pleural effusion: Secondary | ICD-10-CM | POA: Diagnosis present

## 2015-07-29 DIAGNOSIS — M549 Dorsalgia, unspecified: Secondary | ICD-10-CM | POA: Diagnosis present

## 2015-07-29 DIAGNOSIS — C7951 Secondary malignant neoplasm of bone: Secondary | ICD-10-CM | POA: Diagnosis present

## 2015-07-29 DIAGNOSIS — I959 Hypotension, unspecified: Secondary | ICD-10-CM | POA: Diagnosis present

## 2015-07-29 DIAGNOSIS — Z791 Long term (current) use of non-steroidal anti-inflammatories (NSAID): Secondary | ICD-10-CM

## 2015-07-29 DIAGNOSIS — C799 Secondary malignant neoplasm of unspecified site: Secondary | ICD-10-CM

## 2015-07-29 DIAGNOSIS — J948 Other specified pleural conditions: Secondary | ICD-10-CM

## 2015-07-29 DIAGNOSIS — R180 Malignant ascites: Secondary | ICD-10-CM | POA: Diagnosis present

## 2015-07-29 DIAGNOSIS — C3411 Malignant neoplasm of upper lobe, right bronchus or lung: Secondary | ICD-10-CM

## 2015-07-29 LAB — URINALYSIS, ROUTINE W REFLEX MICROSCOPIC
Bilirubin Urine: NEGATIVE
GLUCOSE, UA: NEGATIVE mg/dL
Hgb urine dipstick: NEGATIVE
Ketones, ur: NEGATIVE mg/dL
LEUKOCYTES UA: NEGATIVE
Nitrite: NEGATIVE
PH: 5.5 (ref 5.0–8.0)
PROTEIN: NEGATIVE mg/dL
Specific Gravity, Urine: 1.039 — ABNORMAL HIGH (ref 1.005–1.030)
Urobilinogen, UA: 0.2 mg/dL (ref 0.0–1.0)

## 2015-07-29 LAB — COMPREHENSIVE METABOLIC PANEL
ALT: 24 U/L (ref 17–63)
ANION GAP: 7 (ref 5–15)
AST: 23 U/L (ref 15–41)
Albumin: 3.2 g/dL — ABNORMAL LOW (ref 3.5–5.0)
Alkaline Phosphatase: 62 U/L (ref 38–126)
BUN: 30 mg/dL — ABNORMAL HIGH (ref 6–20)
CHLORIDE: 96 mmol/L — AB (ref 101–111)
CO2: 37 mmol/L — AB (ref 22–32)
Calcium: 9.2 mg/dL (ref 8.9–10.3)
Creatinine, Ser: 1.1 mg/dL (ref 0.61–1.24)
GFR calc non Af Amer: >60 mL/min (ref >60)
Glucose, Bld: 119 mg/dL — ABNORMAL HIGH (ref 65–99)
Potassium: 3.8 mmol/L (ref 3.5–5.1)
SODIUM: 140 mmol/L (ref 135–145)
Total Bilirubin: 0.6 mg/dL (ref 0.3–1.2)
Total Protein: 6.5 g/dL (ref 6.5–8.1)

## 2015-07-29 LAB — I-STAT CREATININE, ED: Creatinine, Ser: 1.3 mg/dL — ABNORMAL HIGH (ref 0.61–1.24)

## 2015-07-29 LAB — BRAIN NATRIURETIC PEPTIDE: B Natriuretic Peptide: 30.7 pg/mL (ref 0.0–100.0)

## 2015-07-29 LAB — CBC WITH DIFFERENTIAL/PLATELET
Basophils Absolute: 0 10*3/uL (ref 0.0–0.1)
Basophils Relative: 0 % (ref 0–1)
Eosinophils Absolute: 0.1 10*3/uL (ref 0.0–0.7)
Eosinophils Relative: 1 % (ref 0–5)
HCT: 38.4 % — ABNORMAL LOW (ref 39.0–52.0)
HEMOGLOBIN: 12.7 g/dL — AB (ref 13.0–17.0)
LYMPHS ABS: 1.2 10*3/uL (ref 0.7–4.0)
LYMPHS PCT: 11 % — AB (ref 12–46)
MCH: 34.7 pg — AB (ref 26.0–34.0)
MCHC: 33.1 g/dL (ref 30.0–36.0)
MCV: 104.9 fL — AB (ref 78.0–100.0)
Monocytes Absolute: 1.1 10*3/uL — ABNORMAL HIGH (ref 0.1–1.0)
Monocytes Relative: 10 % (ref 3–12)
NEUTROS PCT: 78 % — AB (ref 43–77)
Neutro Abs: 8.5 10*3/uL — ABNORMAL HIGH (ref 1.7–7.7)
Platelets: 359 10*3/uL (ref 150–400)
RBC: 3.66 MIL/uL — AB (ref 4.22–5.81)
RDW: 15.5 % (ref 11.5–15.5)
WBC: 11 10*3/uL — AB (ref 4.0–10.5)

## 2015-07-29 LAB — I-STAT CG4 LACTIC ACID, ED: LACTIC ACID, VENOUS: 0.64 mmol/L (ref 0.5–2.0)

## 2015-07-29 MED ORDER — IOHEXOL 300 MG/ML  SOLN
25.0000 mL | Freq: Once | INTRAMUSCULAR | Status: AC | PRN
  Administered 2015-07-29: 25 mL via ORAL

## 2015-07-29 MED ORDER — ALUM & MAG HYDROXIDE-SIMETH 200-200-20 MG/5ML PO SUSP: 30.0000 mL | Freq: Four times a day (QID) | ORAL | Status: DC | PRN

## 2015-07-29 MED ORDER — ALBUTEROL SULFATE (2.5 MG/3ML) 0.083% IN NEBU
2.5000 mg | INHALATION_SOLUTION | Freq: Four times a day (QID) | RESPIRATORY_TRACT | Status: DC | PRN
Start: 2015-07-29 — End: 2015-08-01
  Administered 2015-08-01: 2.5 mg via RESPIRATORY_TRACT
  Filled 2015-07-29: qty 3

## 2015-07-29 MED ORDER — DM-GUAIFENESIN ER 30-600 MG PO TB12
1.0000 | ORAL_TABLET | Freq: Two times a day (BID) | ORAL | Status: DC | PRN
  Filled 2015-07-29: qty 1

## 2015-07-29 MED ORDER — METOPROLOL SUCCINATE ER 25 MG PO TB24
25.0000 mg | ORAL_TABLET | Freq: Every day | ORAL | Status: DC
  Filled 2015-07-29: qty 1

## 2015-07-29 MED ORDER — ACETAMINOPHEN 650 MG RE SUPP: 650.0000 mg | Freq: Four times a day (QID) | RECTAL | Status: DC | PRN

## 2015-07-29 MED ORDER — IPRATROPIUM-ALBUTEROL 0.5-2.5 (3) MG/3ML IN SOLN
3.0000 mL | RESPIRATORY_TRACT | Status: DC
  Administered 2015-07-29 – 2015-07-30 (×2): 3 mL via RESPIRATORY_TRACT
  Filled 2015-07-29 (×2): qty 3

## 2015-07-29 MED ORDER — OXYCODONE HCL 5 MG PO TABS
5.0000 mg | ORAL_TABLET | ORAL | Status: DC | PRN
  Administered 2015-07-30 – 2015-08-01 (×8): 5 mg via ORAL
  Filled 2015-07-29 (×8): qty 1

## 2015-07-29 MED ORDER — TAMSULOSIN HCL 0.4 MG PO CAPS
0.4000 mg | ORAL_CAPSULE | Freq: Once | ORAL | Status: AC
  Administered 2015-07-29: 0.4 mg via ORAL
  Filled 2015-07-29: qty 1

## 2015-07-29 MED ORDER — HYDRALAZINE HCL 20 MG/ML IJ SOLN: 5.0000 mg | INTRAMUSCULAR | Status: DC | PRN

## 2015-07-29 MED ORDER — ENSURE ENLIVE PO LIQD
237.0000 mL | Freq: Two times a day (BID) | ORAL | Status: DC
  Administered 2015-07-30 – 2015-08-01 (×2): 237 mL via ORAL

## 2015-07-29 MED ORDER — IOHEXOL 300 MG/ML  SOLN
100.0000 mL | Freq: Once | INTRAMUSCULAR | Status: AC | PRN
  Administered 2015-07-29: 100 mL via INTRAVENOUS

## 2015-07-29 MED ORDER — SODIUM CHLORIDE 0.9 % IV SOLN
Freq: Once | INTRAVENOUS | Status: AC
  Administered 2015-07-30: 02:00:00 via INTRAVENOUS

## 2015-07-29 MED ORDER — SODIUM CHLORIDE 0.9 % IJ SOLN
3.0000 mL | Freq: Two times a day (BID) | INTRAMUSCULAR | Status: DC
  Administered 2015-07-30 – 2015-08-01 (×3): 3 mL via INTRAVENOUS

## 2015-07-29 MED ORDER — TAMSULOSIN HCL 0.4 MG PO CAPS
0.4000 mg | ORAL_CAPSULE | Freq: Every day | ORAL | Status: DC
  Administered 2015-07-30 – 2015-08-01 (×3): 0.4 mg via ORAL
  Filled 2015-07-29 (×4): qty 1

## 2015-07-29 MED ORDER — MOMETASONE FURO-FORMOTEROL FUM 200-5 MCG/ACT IN AERO
1.0000 | INHALATION_SPRAY | Freq: Two times a day (BID) | RESPIRATORY_TRACT | Status: DC
  Administered 2015-07-30 – 2015-08-01 (×5): 1 via RESPIRATORY_TRACT
  Filled 2015-07-29: qty 8.8

## 2015-07-29 MED ORDER — ACETAMINOPHEN 325 MG PO TABS
650.0000 mg | ORAL_TABLET | Freq: Four times a day (QID) | ORAL | Status: DC | PRN
  Administered 2015-07-30: 650 mg via ORAL
  Filled 2015-07-29: qty 2

## 2015-07-29 MED ORDER — DOCUSATE SODIUM 100 MG PO CAPS
100.0000 mg | ORAL_CAPSULE | Freq: Two times a day (BID) | ORAL | Status: DC
  Administered 2015-07-30 – 2015-08-01 (×4): 100 mg via ORAL
  Filled 2015-07-29 (×9): qty 1

## 2015-07-29 MED ORDER — SODIUM CHLORIDE 0.9 % IV SOLN
Freq: Once | INTRAVENOUS | Status: AC
  Administered 2015-07-29: 21:00:00 via INTRAVENOUS

## 2015-07-29 MED ORDER — PREDNISONE 10 MG PO TABS
10.0000 mg | ORAL_TABLET | Freq: Every day | ORAL | Status: DC
  Administered 2015-07-30 – 2015-08-01 (×3): 10 mg via ORAL
  Filled 2015-07-29 (×6): qty 1

## 2015-07-29 MED ORDER — FENTANYL CITRATE (PF) 100 MCG/2ML IJ SOLN
50.0000 ug | Freq: Once | INTRAMUSCULAR | Status: AC
  Administered 2015-07-29: 50 ug via INTRAVENOUS
  Filled 2015-07-29: qty 2

## 2015-07-29 MED ORDER — POLYETHYLENE GLYCOL 3350 17 G PO PACK: 17.0000 g | PACK | Freq: Every day | ORAL | Status: DC | PRN

## 2015-07-29 MED ORDER — FOLIC ACID 1 MG PO TABS
1.0000 mg | ORAL_TABLET | Freq: Every day | ORAL | Status: DC
  Administered 2015-07-30 – 2015-08-01 (×3): 1 mg via ORAL
  Filled 2015-07-29 (×4): qty 1

## 2015-07-29 MED ORDER — MORPHINE SULFATE 15 MG PO TABS
15.0000 mg | ORAL_TABLET | ORAL | Status: DC | PRN
  Administered 2015-07-31 – 2015-08-01 (×2): 15 mg via ORAL
  Filled 2015-07-29 (×3): qty 1

## 2015-07-29 MED ORDER — HYDROCORTISONE NA SUCCINATE PF 100 MG IJ SOLR
50.0000 mg | Freq: Once | INTRAMUSCULAR | Status: AC
  Administered 2015-07-30: 50 mg via INTRAVENOUS
  Filled 2015-07-29: qty 1

## 2015-07-29 MED ORDER — FENTANYL CITRATE (PF) 100 MCG/2ML IJ SOLN
100.0000 ug | Freq: Once | INTRAMUSCULAR | Status: AC
  Administered 2015-07-29: 100 ug via INTRAVENOUS
  Filled 2015-07-29: qty 2

## 2015-07-29 NOTE — ED Notes (Signed)
MD and PA at bedside performing [place].

## 2015-07-29 NOTE — ED Notes (Signed)
Pt attempted to void w/o success. Will attempt later. Urinal remains at bedside.

## 2015-07-29 NOTE — ED Notes (Signed)
Patient transported to CT 

## 2015-07-29 NOTE — ED Notes (Signed)
Pt has a urinal and was made aware that we need a urine sample.

## 2015-07-29 NOTE — ED Notes (Signed)
PA at bedside.

## 2015-07-29 NOTE — H&P (Signed)
[facility] History and Physical  Henry [name] XHB:[id number] DOB: 11/14/1951 DOA: 07/29/2015  Referring physician: ED physician PCP: Donnie Coffin, MD  Specialists:   Chief Complaint: Hypotension, urinary retention, lower back pain, suprapubic abdominal pain, sob  HPI: Henry [name] is a 64 y.o. male with PMH of hypertension, metastasized Lung cancer(s/p of chemotherapy), thyroid cancer, adenocarcinoma with unknown origin, bilateral pleural effusion, who presents with hypotension, urinary retention, lower back pain, suprapubic abdominal pain, sob  Patient reports that he has urinary retention which has been going on for almost a year. He was recently started with the Flomax by his oncologist, Dr. Julien Nordmann. He has been compliant to this medication, but still has some difficult urinating. He has urgency, but no dysuria or burning on urination. He complains of mild suprapubic abdominal pain. Sister reports patient has been hypotensive the last 2 days, yesterday systolic pressure was 81O and only earlier today did pressure rise to 17P systolic. Patient also has abdominal distention. Patient reports severe lower low back pain that also started 2 days ago, without leg weakness or tingling. He reports that has SOB which is slightly worse than his baseline. No chest pain. He has mild cough without sputum production. No fever or chills. He reports that he is constipated recently. Patient reports that his is scheduled for thoracentesis tomorrow.  In ED, patient was found to have WBC 11.0, temperature normal, soft blood pressure with SBP 80 to 90 mmHg, AKI, lactate 0.64, BNP 30.7, negative urinalysis. CT-ches and CT-abd/pelvis showed: 1. increased size of large left pleural effusion which appears partially loculated. Near complete collapse of the left lower lobe. 2. Unchanged, partially loculated right pleural effusion. 3. Unchanged right lower lobe consolidation and nodular densities in the anterior right  lung base. 4. Large volume abdominal ascites, increased from prior. 5. Decreased size of polypoid lesions in the ascending colon. 6. Unchanged L1 sclerotic vertebral metastasis. No new bone metastases identified.  Where does patient live?   At home  Can patient participate in ADLs?  Barely   Review of Systems:   General: no fevers, chills, no changes in body weight, has poor appetite, has fatigue HEENT: no blurry vision, hearing changes or sore throat Pulm: has dyspnea, coughing, no wheezing CV: no chest pain, palpitations Abd: no nausea, vomiting, has abdominal pain, no diarrhea, has constipation GU: no dysuria, burning on urination, increased urinary frequency, hematuria  Ext: has leg edema Neuro: no unilateral weakness, numbness, or tingling, no vision change or hearing loss Skin: no rash MSK: No muscle spasm, no deformity, no limitation of range of movement in spin Heme: No easy bruising.  Travel history: No recent long distant travel.  Allergy:  Allergies  Allergen Reactions  . Other Diarrhea    Red meats     Past Medical History  Diagnosis Date  . Hypertension   . GERD (gastroesophageal reflux disease)   . Hemochromatosis   . Hx of adenomatous colonic polyps   . Umbilical hernia     none since weight loss  . Diverticulosis   . Lumbar spondylosis     ankle  . Pleural effusion on right 12/16/13    per chest xray  . Shortness of breath     with  exertion  . Chronic pancreatitis   . IBS (irritable bowel syndrome)   . Lung cancer 06/2014  . 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 Pleural effusion 07-11-15    Past Surgical History  Procedure Laterality Date  . Lumbar laminectomy  1983  . Knee arthrsoscopy  2003    left  . Tonsillectomy  1962  . Eus N/A 04/07/2013    Procedure: UPPER ENDOSCOPIC ULTRASOUND (EUS) LINEAR;  Surgeon: [name], MD;  Location: [facility];  Service: Endoscopy;  Laterality: N/A;  . Video assisted thoracoscopy (vats)/decortication  Right 06/22/2014    Procedure: VIDEO ASSISTED THORACOSCOPY (VATS)/DECORTICATION;  Surgeon: [name], MD;  Location: [place];  Service: Thoracic;  Laterality: Right;  . Pleural effusion drainage Right 06/22/2014    Procedure: DRAINAGE OF PLEURAL EFFUSION;  Surgeon: [name], MD;  Location: [place];  Service: Thoracic;  Laterality: Right;  . Eus N/A 07/20/2014    Procedure: UPPER ENDOSCOPIC ULTRASOUND (EUS) LINEAR;  Surgeon: [name], MD;  Location: [facility];  Service: Endoscopy;  Laterality: N/A;    Social History:  reports that he quit smoking about 2 weeks ago. His smoking use included Cigarettes. He has a 80 pack-year smoking history. He has never used smokeless tobacco. He reports that he drinks about 12.6 oz of alcohol per week. He reports that he does not use illicit drugs.  Family History:  Family History  Problem Relation Age of Onset  . Colon cancer Maternal Grandmother 76  . Stomach cancer Paternal Grandfather 39  . Hypertension Mother   . Varicose Veins Mother   . Hypertension Father   . Varicose Veins Father   . Hypertension Sister   . Heart disease Sister      Prior to Admission medications   Medication Sig Start Date End Date Taking? Authorizing Provider  albuterol (PROVENTIL) (2.5 MG/3ML) 0.083% nebulizer solution Take 3 mLs (2.5 mg total) by nebulization every 6 (six) hours as needed for wheezing or shortness of breath. 07/15/15  Yes Annita Brod, MD  dextromethorphan-guaiFENesin [facility] DM) 30-600 MG per 12 hr tablet Take 1 tablet by mouth 2 (two) times daily as needed for cough. 07/15/15  Yes Annita Brod, MD  DULERA 200-5 MCG/ACT AERO Inhale 1 puff into the lungs 2 (two) times daily. 07/06/15  Yes Historical Provider, MD  EPIPEN 2-PAK 0.3 MG/0.3ML SOAJ injection See admin instructions. 05/31/15  Yes Historical Provider, MD  folic acid (FOLVITE) 1 MG tablet Take 1 tablet (1 mg total) by mouth daily. 11/29/14  Yes Curt Bears, MD   furosemide (LASIX) 40 MG tablet Take 40 mg by mouth every morning.  04/03/15  Yes Historical Provider, MD  ibuprofen (ADVIL,MOTRIN) 200 MG tablet Take 400 mg by mouth every 6 (six) hours as needed (Pain).   Yes Historical Provider, MD  KLOR-CON M10 10 MEQ tablet Take 10 mEq by mouth 2 (two) times daily. 07/24/15  Yes Historical Provider, MD  metoprolol succinate (TOPROL-XL) 50 MG 24 hr tablet Take 50 mg by mouth daily after lunch.  03/29/15  Yes Historical Provider, MD  morphine (MSIR) 15 MG tablet Take 1 tablet (15 mg total) by mouth every 4 (four) hours as needed for severe pain. 07/26/15  Yes Curt Bears, MD  oxyCODONE (OXY IR/ROXICODONE) 5 MG immediate release tablet Take 5 mg by mouth every 4 (four) hours as needed for moderate pain.  07/15/15  Yes Historical Provider, MD  predniSONE (DELTASONE) 10 MG tablet Take 1 tablet (10 mg total) by mouth daily with breakfast. 07/26/15  Yes Curt Bears, MD  PROAIR HFA 108 (90 BASE) MCG/ACT inhaler Inhale 1 each into the lungs 2 (two) times daily. 07/04/15  Yes Historical Provider, MD  spironolactone (ALDACTONE) 25 MG tablet Take 25  mg by mouth daily after lunch.   Yes Historical Provider, MD  tamsulosin (FLOMAX) 0.4 MG CAPS capsule Take 0.4 mg by mouth daily. 07/25/15  Yes Historical Provider, MD  [place] Name: Oxygen concentrator.  Per medical necessity pt needs portable oxygen concentrator via nasal cannula with activity. 05/10/15  Yes Curt Bears, MD  valsartan (DIOVAN) 160 MG tablet Take 1 tablet (160 mg total) by mouth daily. 07/20/15  Yes Tanda Rockers, MD  verapamil (VERELAN PM) 240 MG 24 hr capsule Take 240 mg by mouth daily. 07/24/15  Yes Historical Provider, MD  acetaminophen-codeine (TYLENOL #3) 300-30 MG per tablet Take 1-2 tablets by mouth every 4 (four) hours as needed for moderate pain. Patient not taking: Reported on 07/26/2015 07/15/15   Annita Brod, MD    Physical Exam: Filed Vitals:   07/29/15 1700 07/29/15 1733 07/29/15  1800 07/29/15 2134  BP: '98/64 99/62 93/61 '$ 110/73  Pulse: 81 84  78  Temp:      TempSrc:      Resp: '27 18 17 12  '$ SpO2: 100% 98%  100%   General: Not in acute distress HEENT:       Eyes: PERRL, EOMI, no scleral icterus.       ENT: No discharge from the ears and nose, no pharynx injection, no tonsillar enlargement.        Neck: No JVD, no bruit, no mass felt. Heme: No neck lymph node enlargement. Cardiac: S1/S2, RRR, No murmurs, No gallops or rubs. Pulm: decreased air movement on the left side, has rhonchi on the R side. No rales, wheezing or rubs. Abd: Soft, mildly distended, has tenderness over suprapubic area, no rebound pain, no organomegaly, BS present. Ext: No pitting leg edema bilaterally. 2+DP/PT pulse bilaterally. Musculoskeletal: No joint deformities, No joint redness or warmth, no limitation of ROM in spin. Skin: No rashes.  Neuro: Alert, oriented X3, cranial nerves II-XII grossly intact, muscle strength 5/5 in all extremities, sensation to light touch intact.  Psych: Patient is not psychotic, no suicidal or hemocidal ideation.  Labs on Admission:  Basic Metabolic Panel:  Recent Labs Lab 07/26/15 1009 07/29/15 1652 07/29/15 1711  NA 140 140  --   K 3.9 3.8  --   CL  --  96*  --   CO2 37* 37*  --   GLUCOSE 163* 119*  --   BUN 26.0 30*  --   CREATININE 1.0 1.10 1.30*  CALCIUM 9.4 9.2  --    Liver Function Tests:  Recent Labs Lab 07/26/15 1009 07/29/15 1652  AST 23 23  ALT 19 24  ALKPHOS 70 62  BILITOT 0.74 0.6  PROT 6.5 6.5  ALBUMIN 3.0* 3.2*   No results for input(s): LIPASE, AMYLASE in the last 168 hours. No results for input(s): AMMONIA in the last 168 hours. CBC:  Recent Labs Lab 07/26/15 1009 07/29/15 1652  WBC 10.4* 11.0*  NEUTROABS 8.0* 8.5*  HGB 13.0 12.7*  HCT 39.1 38.4*  MCV 103.7* 104.9*  PLT 323 359   Cardiac Enzymes: No results for input(s): CKTOTAL, CKMB, CKMBINDEX, TROPONINI in the last 168 hours.  BNP (last 3  results)  Recent Labs  07/14/15 1400 07/29/15 1652  BNP 60.1 30.7    ProBNP (last 3 results) No results for input(s): PROBNP in the last 8760 hours.  CBG: No results for input(s): GLUCAP in the last 168 hours.  Radiological Exams on Admission: Dg Chest 2 View  07/29/2015   CLINICAL  DATA:  Shortness of breath. Abdominal distention. History of lung cancer. Recent chemotherapy.  EXAM: CHEST  2 VIEW  COMPARISON:  07/26/2015  FINDINGS: Cardiomediastinal silhouette is grossly unchanged, with the cardiac silhouette being partially obscured. Large left pleural effusion is unchanged with associated compressive left upper and lower lobe atelectasis. Small right pleural effusion also does not appear significantly changed. Patchy and nodular parenchymal opacities in the right lung base are unchanged. No definite new areas of airspace consolidation are seen. There is no pneumothorax. No acute osseous abnormality is seen.  IMPRESSION: Unchanged examination with large left and small right pleural effusions, associated compressive atelectasis, and chronic right basilar opacities as above.   Electronically Signed   By: Logan Bores M.D.   On: 07/29/2015 17:37   Dg Abd 1 View  07/29/2015   CLINICAL DATA:  Shortness of breath and abdominal distension. History of lung cancer with recent chemotherapy.  EXAM: ABDOMEN - 1 VIEW  COMPARISON:  CT abdomen and pelvis 07/10/2015  FINDINGS: There is a small to moderate amount of stool and gas in nondilated colon. There is a paucity of small bowel gas, which limits evaluation for obstruction. Increased density in the abdomen may partially reflect ascites, as was present on the prior CT. The hepatic shadow is incompletely visualized but appears mildly enlarged. The uppermost portion of the abdomen was incompletely imaged. Mild scoliosis and moderate disc degeneration is noted in the lumbar spine.  IMPRESSION: 1. Increased density in the abdomen which may reflect ascites. 2.  Small to moderate amount of stool in the colon. A paucity of small bowel gas limits evaluation for small bowel obstruction.   Electronically Signed   By: Logan Bores M.D.   On: 07/29/2015 17:42   Ct Chest W Contrast  07/29/2015   CLINICAL DATA:  Hypotension.  Lung cancer.  EXAM: CT CHEST, ABDOMEN, AND PELVIS WITH CONTRAST  TECHNIQUE: Multidetector CT imaging of the chest, abdomen and pelvis was performed following the standard protocol during bolus administration of intravenous contrast.  CONTRAST:  126m OMNIPAQUE IOHEXOL 300 MG/ML  SOLN  COMPARISON:  CT chest, abdomen, and pelvis 07/10/2015.  FINDINGS: CT CHEST FINDINGS  Small right axillary and subpectoral lymph nodes measure up to 7 mm in short axis, unchanged. No enlarged supraclavicular lymph nodes are identified. Small, scattered mediastinal lymph nodes measure up to 7 mm in short axis and are stable to minimally smaller than on the prior study. The heart is normal in size and is shifted rightward. Centrilobular emphysema is again noted.  Small right pleural effusion has not significantly changed in size, with loculated components again seen anteriorly and in a subpulmonic location. Increased right-sided pleural density is consistent with prior talc pleurodesis. Consolidative and ground-glass opacity in the right lower lobe has not significantly changed. Nodular densities in the anterior aspect of the basilar right lower lobe and in the right middle lobe are unchanged, measuring up to 2.1 cm in size (series 7, image 40). Diffuse, mild thickening along the right major and minor fissures with areas of mild nodularity is unchanged.  Large left pleural effusion has mildly increased in size overall with some loculation anteriorly. Small focus of likely loculated pleural fluid posteriorly towards the apex is slightly smaller. There is increased compressive atelectasis on the left lower lobe and lingula, now with near complete left lower lobe collapse. No  definite enhancing pleural nodules are identified. 3 mm left upper lobe nodule is unchanged (series 7, image 26). Small focus of chronic  scarring in the left upper lobe is unchanged (series 7, image 12). No suspicious lytic or blastic osseous lesions are identified in the chest.  CT ABDOMEN AND PELVIS FINDINGS  The liver is enlarged, measuring approximately 20 cm in craniocaudal length. Small low-density liver lesions are unchanged and compatible with cysts, measuring up to 1.4 cm in size. Gallbladder is grossly unremarkable. Spleen and kidneys are unremarkable. Mild thickening of both adrenal glands is again noted. 13 mm mildly complex/septated cystic lesion in the pancreatic head is unchanged and may represent an IPMN.  Oral contrast in the distal esophagus may reflect dysmotility or reflux. Oral contrast is present in multiple nondilated loops of small bowel without evidence of obstruction. Three polypoid soft tissue lesions in the ascending colon are less prominent in size than on the most recent prior study and similar in size to the 05/01/2015 examination, measuring up to approximately 1.7 cm (series 2 images 80-85). There is a moderate amount of stool in the colon.  Bladder is unremarkable. Prostate is enlarged. There is mild-to-moderate atherosclerotic calcification of the abdominal aorta and its major branch vessels. 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 The SMV and portal vein appear patent. No enlarged lymph nodes are identified. There is large volume ascites, increased from the prior abdominal CT. Diffuse body wall edema is present. 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 No new suspicious osseous lesions are identified. Advanced disc degeneration is present at L4-5.  IMPRESSION: 1. Increased size of large left pleural effusion which appears partially loculated. Near complete collapse of the left lower lobe. 2. Unchanged,  partially loculated right pleural effusion. 3. Unchanged right lower lobe consolidation and nodular densities in the anterior right lung base. 4. Large volume abdominal ascites, increased from prior. 5. Decreased size of polypoid lesions in the ascending colon. 6. Unchanged L1 sclerotic vertebral metastasis. No new bone metastases identified.   Electronically Signed   By: Logan Bores M.D.   On: 07/29/2015 19:30   Ct Abdomen Pelvis W Contrast  07/29/2015   CLINICAL DATA:  Hypotension.  Lung cancer.  EXAM: CT CHEST, ABDOMEN, AND PELVIS WITH CONTRAST  TECHNIQUE: Multidetector CT imaging of the chest, abdomen and pelvis was performed following the standard protocol during bolus administration of intravenous contrast.  CONTRAST:  141m OMNIPAQUE IOHEXOL 300 MG/ML  SOLN  COMPARISON:  CT chest, abdomen, and pelvis 07/10/2015.  FINDINGS: CT CHEST FINDINGS  Small right axillary and subpectoral lymph nodes measure up to 7 mm in short axis, unchanged. No enlarged supraclavicular lymph nodes are identified. Small, scattered mediastinal lymph nodes measure up to 7 mm in short axis and are stable to minimally smaller than on the prior study. The heart is normal in size and is shifted rightward. Centrilobular emphysema is again noted.  Small right pleural effusion has not significantly changed in size, with loculated components again seen anteriorly and in a subpulmonic location. Increased right-sided pleural density is consistent with prior talc pleurodesis. Consolidative and ground-glass opacity in the right lower lobe has not significantly changed. Nodular densities in the anterior aspect of the basilar right lower lobe and in the right middle lobe are unchanged, measuring up to 2.1 cm in size (series 7, image 40). Diffuse, mild thickening along the right major and minor fissures with areas of mild nodularity is unchanged.  Large left pleural effusion has mildly increased in size overall with some loculation anteriorly.  Small focus of likely loculated pleural  fluid posteriorly towards the apex is slightly smaller. There is increased compressive atelectasis on the left lower lobe and lingula, now with near complete left lower lobe collapse. No definite enhancing pleural nodules are identified. 3 mm left upper lobe nodule is unchanged (series 7, image 26). Small focus of chronic scarring in the left upper lobe is unchanged (series 7, image 12). No suspicious lytic or blastic osseous lesions are identified in the chest.  CT ABDOMEN AND PELVIS FINDINGS  The liver is enlarged, measuring approximately 20 cm in craniocaudal length. Small low-density liver lesions are unchanged and compatible with cysts, measuring up to 1.4 cm in size. Gallbladder is grossly unremarkable. Spleen and kidneys are unremarkable. Mild thickening of both adrenal glands is again noted. 13 mm mildly complex/septated cystic lesion in the pancreatic head is unchanged and may represent an IPMN.  Oral contrast in the distal esophagus may reflect dysmotility or reflux. Oral contrast is present in multiple nondilated loops of small bowel without evidence of obstruction. Three polypoid soft tissue lesions in the ascending colon are less prominent in size than on the most recent prior study and similar in size to the 05/01/2015 examination, measuring up to approximately 1.7 cm (series 2 images 80-85). There is a moderate amount of stool in the colon.  Bladder is unremarkable. Prostate is enlarged. There is mild-to-moderate atherosclerotic calcification of the abdominal aorta and its major branch vessels. 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 The SMV and portal vein appear patent. No enlarged lymph nodes are identified. There is large volume ascites, increased from the prior abdominal CT. Diffuse body wall edema is present. 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 No new suspicious osseous  lesions are identified. Advanced disc degeneration is present at L4-5.  IMPRESSION: 1. Increased size of large left pleural effusion which appears partially loculated. Near complete collapse of the left lower lobe. 2. Unchanged, partially loculated right pleural effusion. 3. Unchanged right lower lobe consolidation and nodular densities in the anterior right lung base. 4. Large volume abdominal ascites, increased from prior. 5. Decreased size of polypoid lesions in the ascending colon. 6. Unchanged L1 sclerotic vertebral metastasis. No new bone metastases identified.   Electronically Signed   By: Logan Bores M.D.   On: 07/29/2015 19:30    EKG: Independently reviewed. Has nonspecific T-wave change.  Assessment/Plan Principal Problem:   Hypotension Active Problems:   Essential hypertension   Hemochromatosis   Pleural effusion on right   Lung cancer   Protein-calorie malnutrition, severe   Pleural effusion on left   Metastatic adenocarcinoma of unknown origin   Chronic diastolic heart failure   SOB (shortness of breath)   Generalized weakness   AKI (acute kidney injury)   Constipation   Urinary retention   Back pain  Hypotension: Is likely due to multiple blood pressure medications. He is taking Lasix 40 mg daily, metoprolol 50 mg daily, spironolactone 25 mg daily, Flomax 0.4 mg daily, Diovan 160 mg daily, verapamil 240 g daily. Patient does not seem to have sepsis clinically. His lactate is 0.64. He has a mild leukocytosis with WBC 11, but no fever and tachycardia. Another differential diagnosis is adrenal insufficiency given the patient is on chronic prednisone. -Will admit to tele bed -Hold Lasix, spironolactone, Diovan, verapamil -Decrease metoprolol dose from 50-25 mg daily -gentle IVF: NS 50 cc/h -IV hydralazine when necessary -give Solu Cortef 50 mg 1 for stress dose -Check cortisol level -will  get Bx if develops fever  AKI: Likely due to prerenal secondary to hypotension and  continuation of ARB, diruetics, NSAIDs. No hydronephrosis by CT abdomen/pelvis - IVF as above - Check FeUrea - Follow up renal function by BMP - Hold Diovan, ibuprofen, and lasix  Essential hypertension: now with hypotension -Hold oral blood pressure medications, except for decreased metoprolol dose from 50-25 mg daily. -IV hydralazine when necessary  Shortness of breath and bilateral pleural effusion: Patient's worsening shortness of breath is likely due to worsening L pleural effusion as evidenced with CT chest. Large left pleural effusion appears partially loculated, but patient does not have fever or CP, clinically not septic. Will hold off Abx now. Patient is scheduled for thoracentesis tomorrow.  - I called IR in night, On-call physician asked to call IR in AM (after 7:00 AM) - Duoneb Nebs, Dulera inhaler when necessary albuterol nebulizer - Mucinex for cough - On prednison 10 mg daily - give Solu Cortef 50 mg 1 for stress dose - Check cortisol level  Metastasized lung cancer: s/p of chemotherapy. Last dose was 5 weeks ago. Followed up with Dr. Julien Nordmann. Plan to do immunotherapy in the coming Thursday. -Follow-up with Dr. Julien Nordmann  Metastatic adenocarcinoma of unknown origin: -Follow-up with Dr. Julien Nordmann  Protein-calorie malnutrition, severe: -Ensure  Chronic diastolic heart failure: 2-D echo on 07/15/15 showed EF of 60-65% with grade 1 diastolic dysfunction. BNP 30.7. Patient has 1+ leg edema. His leg edema is partially from his position since patient has been siting most of the time. CHF is clinically compensated. -Hold Lasix and spironolactone given hypotension -Watched volume status closely  Generalized weakness: Likely due to generalized deconditioning due to multiple comorbidities. -PT/OT  Constipation: -MiraLAX and Colace  Urinary retention: Urinalysis negative. -When necessary in/out catheter -Flomax  Lower back pain: No alarming symptoms, such as leg weakness or  tingling sensation. Most likely due to metastasized disease. CT showed unchanged L1 sclerotic vertebral metastasis. -Continue home morphine and oxycodone when necessary -Tylenol   DVT ppx: one dose of heparin tonight and SCD Code Status: DNR Family Communication:  Yes, patient's  sister at bed side Disposition Plan: Admit to inpatient   Date of Service 07/29/2015    Ivor Costa [facility] Pager [phone]  If 7PM-7AM, please contact night-coverage [email or web address] Password [email or web address] 07/29/2015, 11:11 PM

## 2015-07-29 NOTE — ED Notes (Addendum)
Pt has a hx of lung CA and states that last week he had fluid drawn off his lungs to allow him to breathe better but this week he started having urinary retention and low back pain with hypotension as low as 82E systolic at home. Does take morphine at home. Chronically SOB on 2L [place]. Alert and oriented.

## 2015-07-29 NOTE — ED Notes (Signed)
RN stood by while patient used urinal. After some difficulty, pt was able to urinate. Pt's sister was inquiring about lab work. RN informed her that the provider had to go over the lab work with her. Ben PA was notified that pt/family wanted update.

## 2015-07-29 NOTE — ED Provider Notes (Signed)
CSN: [id number]     Arrival date & time 07/29/15  1556 History   First MD Initiated Contact with Patient 07/29/15 1602     Chief Complaint  Patient presents with  . Hypotension     (Consider location/radiation/quality/duration/timing/severity/associated sxs/prior Treatment) HPI KARIS EMIG is a 64 y.o. male with a history of lung cancer, recent pleural effusions, comes in for evaluation of hypotension. Patient is followed by Dr. Inda Merlin with oncology. Patient reports was seen 2 or 3 days ago and started on Flomax and morphine for low back pain and urinary retention. Reports urinary retention has been ongoing for the last year. Urine output has been better over the last few days after starting Flomax. Patient is accompanied by sister who also contributes to history of present illness. Sister reports patient has been hypotensive the last 2 days, yesterday systolic pressure was 53G and only earlier today did pressure rise to 99M systolic. Patient also reports abdominal distention. Has had paracentesis in the past and was told "there were cancer cells in it". Patient also reports onset of sharp stabbing low back pain that also started 2 days ago in his lower back. Also has not passed gas for one and a half days. Denies any dizziness, headache, vision changes, chest pain or new shortness of breath. Reports chronic leg swelling and he was told that he had "some degree of heart failure". Does take 40 mg of Lasix each day, but did not take today. Denies any other pain or medical problems.  Past Medical History  Diagnosis Date  . Hypertension   . GERD (gastroesophageal reflux disease)   . Hemochromatosis   . Hx of adenomatous colonic polyps   . Umbilical hernia     none since weight loss  . Diverticulosis   . Lumbar spondylosis     ankle  . Pleural effusion on right 12/16/13    per chest xray  . Shortness of breath     with  exertion  . Chronic pancreatitis   . IBS (irritable bowel syndrome)    . Lung cancer 06/2014  . 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 Pleural effusion 07-11-15   Past Surgical History  Procedure Laterality Date  . Lumbar laminectomy  1983  . Knee arthrsoscopy  2003    left  . Tonsillectomy  1962  . Eus N/A 04/07/2013    Procedure: UPPER ENDOSCOPIC ULTRASOUND (EUS) LINEAR;  Surgeon: [name], MD;  Location: [facility];  Service: Endoscopy;  Laterality: N/A;  . Video assisted thoracoscopy (vats)/decortication Right 06/22/2014    Procedure: VIDEO ASSISTED THORACOSCOPY (VATS)/DECORTICATION;  Surgeon: [name], MD;  Location: [place];  Service: Thoracic;  Laterality: Right;  . Pleural effusion drainage Right 06/22/2014    Procedure: DRAINAGE OF PLEURAL EFFUSION;  Surgeon: [name], MD;  Location: [place];  Service: Thoracic;  Laterality: Right;  . Eus N/A 07/20/2014    Procedure: UPPER ENDOSCOPIC ULTRASOUND (EUS) LINEAR;  Surgeon: [name], MD;  Location: [facility];  Service: Endoscopy;  Laterality: N/A;   Family History  Problem Relation Age of Onset  . Colon cancer Maternal Grandmother 56  . Stomach cancer Paternal Grandfather 2  . Hypertension Mother   . Varicose Veins Mother   . Hypertension Father   . Varicose Veins Father   . Hypertension Sister   . Heart disease Sister    Social History  Substance Use Topics  . Smoking status: Former Smoker -- 2.00 packs/day for 40 years  Types: Cigarettes    Quit date: 07/09/2015  . Smokeless tobacco: Never Used     Comment: 12/ to 3/4 packer per day now  . Alcohol Use: 12.6 oz/week    21 Cans of beer per week     Comment: Daily 2-3 beers    Review of Systems A 10 point review of systems was completed and was negative except for pertinent positives and negatives as mentioned in the history of present illness    Allergies  Other  Home Medications   Prior to Admission medications   Medication Sig Start Date End Date Taking? Authorizing Provider   acetaminophen-codeine (TYLENOL #3) 300-30 MG per tablet Take 1-2 tablets by mouth every 4 (four) hours as needed for moderate pain. Patient not taking: Reported on 07/26/2015 07/15/15   Annita Brod, MD  albuterol (PROVENTIL) (2.5 MG/3ML) 0.083% nebulizer solution Take 3 mLs (2.5 mg total) by nebulization every 6 (six) hours as needed for wheezing or shortness of breath. 07/15/15   Annita Brod, MD  dextromethorphan-guaiFENesin [facility] DM) 30-600 MG per 12 hr tablet Take 1 tablet by mouth 2 (two) times daily as needed for cough. 07/15/15   Annita Brod, MD  DULERA 200-5 MCG/ACT AERO Inhale 1 puff into the lungs 2 (two) times daily. 07/06/15   Historical Provider, MD  EPIPEN 2-PAK 0.3 MG/0.3ML SOAJ injection See admin instructions. 05/31/15   Historical Provider, MD  folic acid (FOLVITE) 1 MG tablet Take 1 tablet (1 mg total) by mouth daily. 11/29/14   Curt Bears, MD  furosemide (LASIX) 40 MG tablet Take 40 mg by mouth every morning.  04/03/15   Historical Provider, MD  ibuprofen (ADVIL,MOTRIN) 200 MG tablet Take 400 mg by mouth every 6 (six) hours as needed (Pain).    Historical Provider, MD  KLOR-CON M10 10 MEQ tablet Take 10 mEq by mouth 2 (two) times daily. 07/24/15   Historical Provider, MD  metoprolol succinate (TOPROL-XL) 50 MG 24 hr tablet Take 50 mg by mouth daily after lunch.  03/29/15   Historical Provider, MD  morphine (MSIR) 15 MG tablet Take 1 tablet (15 mg total) by mouth every 4 (four) hours as needed for severe pain. 07/26/15   Curt Bears, MD  oxyCODONE (OXY IR/ROXICODONE) 5 MG immediate release tablet Take 5 mg by mouth every 4 (four) hours as needed. 07/15/15   Historical Provider, MD  predniSONE (DELTASONE) 10 MG tablet Take 1 tablet (10 mg total) by mouth daily with breakfast. 07/26/15   Curt Bears, MD  PROAIR HFA 108 (90 BASE) MCG/ACT inhaler Inhale 1 each into the lungs 2 (two) times daily. 07/04/15   Historical Provider, MD  spironolactone (ALDACTONE) 25 MG tablet  Take 25 mg by mouth daily after lunch.    Historical Provider, MD  tamsulosin (FLOMAX) 0.4 MG CAPS capsule Take 0.4 mg by mouth daily. 07/25/15   Historical Provider, MD  [place] Name: Oxygen concentrator.  Per medical necessity pt needs portable oxygen concentrator via nasal cannula with activity. 05/10/15   Curt Bears, MD  valsartan (DIOVAN) 160 MG tablet Take 1 tablet (160 mg total) by mouth daily. 07/20/15   Tanda Rockers, MD  verapamil (VERELAN PM) 240 MG 24 hr capsule Take 240 mg by mouth daily. 07/24/15   Historical Provider, MD   BP 91/57 mmHg  Pulse 78  Temp(Src) 97.8 F (36.6 C) (Oral)  Resp 20  SpO2 93% Physical Exam  Constitutional: He is oriented to person, place, and  time. He appears well-developed and well-nourished.  Chronically ill-appearing  HENT:  Head: Normocephalic and atraumatic.  Mouth/Throat: Oropharynx is clear and moist.  Eyes: Conjunctivae are normal. Pupils are equal, round, and reactive to light. Right eye exhibits no discharge. Left eye exhibits no discharge. No scleral icterus.  Neck: Neck supple.  Cardiovascular: Normal rate, regular rhythm and normal heart sounds.   Pulmonary/Chest: Effort normal.  Diminished breath sounds diffusely. Rales in bilateral lower lung fields. No tachypnea. On 2 L nasal cannula at baseline.  Abdominal: Soft. There is no tenderness.  Abdomen is distended, tympanic with fluid wave. Otherwise nontender without any other lesions or deformities.  Musculoskeletal: Normal range of motion. He exhibits edema. He exhibits no tenderness.  Bilateral pretibial 1+ edema.  Neurological: He is alert and oriented to person, place, and time.  Cranial Nerves II-XII grossly intact  Skin: Skin is warm and dry. No rash noted.  Psychiatric: He has a normal mood and affect.  Nursing note and vitals reviewed.   ED Course  Procedures (including critical care time) Labs Review Labs Reviewed  COMPREHENSIVE METABOLIC PANEL  BRAIN  NATRIURETIC PEPTIDE  CBC WITH DIFFERENTIAL/PLATELET  URINALYSIS, ROUTINE W REFLEX MICROSCOPIC (NOT AT [facility])    Imaging Review Dg Chest 2 View  07/29/2015   CLINICAL DATA:  Shortness of breath. Abdominal distention. History of lung cancer. Recent chemotherapy.  EXAM: CHEST  2 VIEW  COMPARISON:  07/26/2015  FINDINGS: Cardiomediastinal silhouette is grossly unchanged, with the cardiac silhouette being partially obscured. Large left pleural effusion is unchanged with associated compressive left upper and lower lobe atelectasis. Small right pleural effusion also does not appear significantly changed. Patchy and nodular parenchymal opacities in the right lung base are unchanged. No definite new areas of airspace consolidation are seen. There is no pneumothorax. No acute osseous abnormality is seen.  IMPRESSION: Unchanged examination with large left and small right pleural effusions, associated compressive atelectasis, and chronic right basilar opacities as above.   Electronically Signed   By: Logan Bores M.D.   On: 07/29/2015 17:37   Dg Abd 1 View  07/29/2015   CLINICAL DATA:  Shortness of breath and abdominal distension. History of lung cancer with recent chemotherapy.  EXAM: ABDOMEN - 1 VIEW  COMPARISON:  CT abdomen and pelvis 07/10/2015  FINDINGS: There is a small to moderate amount of stool and gas in nondilated colon. There is a paucity of small bowel gas, which limits evaluation for obstruction. Increased density in the abdomen may partially reflect ascites, as was present on the prior CT. The hepatic shadow is incompletely visualized but appears mildly enlarged. The uppermost portion of the abdomen was incompletely imaged. Mild scoliosis and moderate disc degeneration is noted in the lumbar spine.  IMPRESSION: 1. Increased density in the abdomen which may reflect ascites. 2. Small to moderate amount of stool in the colon. A paucity of small bowel gas limits evaluation for small bowel obstruction.    Electronically Signed   By: Logan Bores M.D.   On: 07/29/2015 17:42   Ct Chest W Contrast  07/29/2015   CLINICAL DATA:  Hypotension.  Lung cancer.  EXAM: CT CHEST, ABDOMEN, AND PELVIS WITH CONTRAST  TECHNIQUE: Multidetector CT imaging of the chest, abdomen and pelvis was performed following the standard protocol during bolus administration of intravenous contrast.  CONTRAST:  143m OMNIPAQUE IOHEXOL 300 MG/ML  SOLN  COMPARISON:  CT chest, abdomen, and pelvis 07/10/2015.  FINDINGS: CT CHEST FINDINGS  Small right axillary and subpectoral lymph  nodes measure up to 7 mm in short axis, unchanged. No enlarged supraclavicular lymph nodes are identified. Small, scattered mediastinal lymph nodes measure up to 7 mm in short axis and are stable to minimally smaller than on the prior study. The heart is normal in size and is shifted rightward. Centrilobular emphysema is again noted.  Small right pleural effusion has not significantly changed in size, with loculated components again seen anteriorly and in a subpulmonic location. Increased right-sided pleural density is consistent with prior talc pleurodesis. Consolidative and ground-glass opacity in the right lower lobe has not significantly changed. Nodular densities in the anterior aspect of the basilar right lower lobe and in the right middle lobe are unchanged, measuring up to 2.1 cm in size (series 7, image 40). Diffuse, mild thickening along the right major and minor fissures with areas of mild nodularity is unchanged.  Large left pleural effusion has mildly increased in size overall with some loculation anteriorly. Small focus of likely loculated pleural fluid posteriorly towards the apex is slightly smaller. There is increased compressive atelectasis on the left lower lobe and lingula, now with near complete left lower lobe collapse. No definite enhancing pleural nodules are identified. 3 mm left upper lobe nodule is unchanged (series 7, image 26). Small focus of  chronic scarring in the left upper lobe is unchanged (series 7, image 12). No suspicious lytic or blastic osseous lesions are identified in the chest.  CT ABDOMEN AND PELVIS FINDINGS  The liver is enlarged, measuring approximately 20 cm in craniocaudal length. Small low-density liver lesions are unchanged and compatible with cysts, measuring up to 1.4 cm in size. Gallbladder is grossly unremarkable. Spleen and kidneys are unremarkable. Mild thickening of both adrenal glands is again noted. 13 mm mildly complex/septated cystic lesion in the pancreatic head is unchanged and may represent an IPMN.  Oral contrast in the distal esophagus may reflect dysmotility or reflux. Oral contrast is present in multiple nondilated loops of small bowel without evidence of obstruction. Three polypoid soft tissue lesions in the ascending colon are less prominent in size than on the most recent prior study and similar in size to the 05/01/2015 examination, measuring up to approximately 1.7 cm (series 2 images 80-85). There is a moderate amount of stool in the colon.  Bladder is unremarkable. Prostate is enlarged. There is mild-to-moderate atherosclerotic calcification of the abdominal aorta and its major branch vessels. 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 The SMV and portal vein appear patent. No enlarged lymph nodes are identified. There is large volume ascites, increased from the prior abdominal CT. Diffuse body wall edema is present. 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 No new suspicious osseous lesions are identified. Advanced disc degeneration is present at L4-5.  IMPRESSION: 1. Increased size of large left pleural effusion which appears partially loculated. Near complete collapse of the left lower lobe. 2. Unchanged, partially loculated right pleural effusion. 3. Unchanged right lower lobe consolidation and nodular densities in the anterior  right lung base. 4. Large volume abdominal ascites, increased from prior. 5. Decreased size of polypoid lesions in the ascending colon. 6. Unchanged L1 sclerotic vertebral metastasis. No new bone metastases identified.   Electronically Signed   By: Logan Bores M.D.   On: 07/29/2015 19:30   Ct Abdomen Pelvis W Contrast  07/29/2015   CLINICAL DATA:  Hypotension.  Lung cancer.  EXAM: CT CHEST, ABDOMEN, AND PELVIS WITH CONTRAST  TECHNIQUE: Multidetector  CT imaging of the chest, abdomen and pelvis was performed following the standard protocol during bolus administration of intravenous contrast.  CONTRAST:  135m OMNIPAQUE IOHEXOL 300 MG/ML  SOLN  COMPARISON:  CT chest, abdomen, and pelvis 07/10/2015.  FINDINGS: CT CHEST FINDINGS  Small right axillary and subpectoral lymph nodes measure up to 7 mm in short axis, unchanged. No enlarged supraclavicular lymph nodes are identified. Small, scattered mediastinal lymph nodes measure up to 7 mm in short axis and are stable to minimally smaller than on the prior study. The heart is normal in size and is shifted rightward. Centrilobular emphysema is again noted.  Small right pleural effusion has not significantly changed in size, with loculated components again seen anteriorly and in a subpulmonic location. Increased right-sided pleural density is consistent with prior talc pleurodesis. Consolidative and ground-glass opacity in the right lower lobe has not significantly changed. Nodular densities in the anterior aspect of the basilar right lower lobe and in the right middle lobe are unchanged, measuring up to 2.1 cm in size (series 7, image 40). Diffuse, mild thickening along the right major and minor fissures with areas of mild nodularity is unchanged.  Large left pleural effusion has mildly increased in size overall with some loculation anteriorly. Small focus of likely loculated pleural fluid posteriorly towards the apex is slightly smaller. There is increased compressive  atelectasis on the left lower lobe and lingula, now with near complete left lower lobe collapse. No definite enhancing pleural nodules are identified. 3 mm left upper lobe nodule is unchanged (series 7, image 26). Small focus of chronic scarring in the left upper lobe is unchanged (series 7, image 12). No suspicious lytic or blastic osseous lesions are identified in the chest.  CT ABDOMEN AND PELVIS FINDINGS  The liver is enlarged, measuring approximately 20 cm in craniocaudal length. Small low-density liver lesions are unchanged and compatible with cysts, measuring up to 1.4 cm in size. Gallbladder is grossly unremarkable. Spleen and kidneys are unremarkable. Mild thickening of both adrenal glands is again noted. 13 mm mildly complex/septated cystic lesion in the pancreatic head is unchanged and may represent an IPMN.  Oral contrast in the distal esophagus may reflect dysmotility or reflux. Oral contrast is present in multiple nondilated loops of small bowel without evidence of obstruction. Three polypoid soft tissue lesions in the ascending colon are less prominent in size than on the most recent prior study and similar in size to the 05/01/2015 examination, measuring up to approximately 1.7 cm (series 2 images 80-85). There is a moderate amount of stool in the colon.  Bladder is unremarkable. Prostate is enlarged. There is mild-to-moderate atherosclerotic calcification of the abdominal aorta and its major branch vessels. 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 The SMV and portal vein appear patent. No enlarged lymph nodes are identified. There is large volume ascites, increased from the prior abdominal CT. Diffuse body wall edema is present. 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 No new suspicious osseous lesions are identified. Advanced disc degeneration is present at L4-5.  IMPRESSION: 1. Increased size of large left pleural  effusion which appears partially loculated. Near complete collapse of the left lower lobe. 2. Unchanged, partially loculated right pleural effusion. 3. Unchanged right lower lobe consolidation and nodular densities in the anterior right lung base. 4. Large volume abdominal ascites, increased from prior. 5. Decreased size of polypoid lesions in the ascending colon. 6. Unchanged L1 sclerotic vertebral metastasis.  No new bone metastases identified.   Electronically Signed   By: Logan Bores M.D.   On: 07/29/2015 19:30   I have personally reviewed and evaluated these images and lab results as part of my medical decision-making.   EKG Interpretation None     Medications  ipratropium-albuterol (DUONEB) 0.5-2.5 (3) MG/3ML nebulizer solution 3 mL (3 mLs Nebulization Given 07/29/15 2054)  0.9 %  sodium chloride infusion (not administered)  sodium chloride 0.9 % injection 3 mL (not administered)  acetaminophen (TYLENOL) tablet 650 mg (not administered)    Or  acetaminophen (TYLENOL) suppository 650 mg (not administered)  alum & mag hydroxide-simeth (MAALOX/MYLANTA) 200-200-20 MG/5ML suspension 30 mL (not administered)  mometasone-formoterol (DULERA) 200-5 MCG/ACT inhaler 1 puff (1 puff Inhalation Not Given 07/29/15 2300)  morphine (MSIR) tablet 15 mg (not administered)  predniSONE (DELTASONE) tablet 10 mg (not administered)  tamsulosin (FLOMAX) capsule 0.4 mg (not administered)  albuterol (PROVENTIL) (2.5 MG/3ML) 0.083% nebulizer solution 2.5 mg (not administered)  dextromethorphan-guaiFENesin (MUCINEX DM) 30-600 MG per 12 hr tablet 1 tablet (not administered)  folic acid (FOLVITE) tablet 1 mg (not administered)  hydrALAZINE (APRESOLINE) injection 5 mg (not administered)  hydrocortisone sodium succinate (SOLU-CORTEF) 100 MG injection 50 mg (not administered)  polyethylene glycol (MIRALAX / GLYCOLAX) packet 17 g (not administered)  docusate sodium (COLACE) capsule 100 mg (not administered)  feeding  supplement (ENSURE ENLIVE) (ENSURE ENLIVE) liquid 237 mL (not administered)  metoprolol succinate (TOPROL-XL) 24 hr tablet 25 mg (not administered)  oxyCODONE (Oxy IR/ROXICODONE) immediate release tablet 5 mg (not administered)  iohexol (OMNIPAQUE) 300 MG/ML solution 25 mL (25 mLs Oral Contrast Given 07/29/15 1753)  iohexol (OMNIPAQUE) 300 MG/ML solution 100 mL (100 mLs Intravenous Contrast Given 07/29/15 1832)  fentaNYL (SUBLIMAZE) injection 50 mcg (50 mcg Intravenous Given 07/29/15 1848)  0.9 %  sodium chloride infusion ( Intravenous New Bag/Given 07/29/15 2054)  tamsulosin (FLOMAX) capsule 0.4 mg (0.4 mg Oral Given 07/29/15 2054)  fentaNYL (SUBLIMAZE) injection 100 mcg (100 mcg Intravenous Given 07/29/15 2127)   Filed Vitals:   07/29/15 1700 07/29/15 1733 07/29/15 1800 07/29/15 2134  BP: '98/64 99/62 93/61 '$ 110/73  Pulse: 81 84  78  Temp:      TempSrc:      Resp: '27 18 17 12  '$ SpO2: 100% 98%  100%    MDM  WALFRED BETTENDORF is a 64 y.o. male with multiple medical problems including metastatic lung cancer and possible malignant ascites, comes in for evaluation of hypotension. Patient states over the past 2 days his systolic blood pressure has decreased into the 70s and 80s. On exam, patient has diminished breath sounds and mild rales bilaterally. Most recent blood pressure was 93/61. Labs are essentially baseline for patient and unremarkable. Urine shows high specific gravity, no evidence of infection. CT abdomen and chest shows increased left large pleural effusion with a near complete collapse of left lower lobe. Patient also has large volume abdominal ascites increased from prior study. Unchanged L1 sclerotic vertebral metastasis without any new bone metastasis. Due to patient's complaint of not being able to pass gas, and hypotension, believe patient would benefit from inpatient admission for further evaluation and possible ileus due to narcotic use as well as further evaluation of  hypotension. Final diagnoses:  Hypotension, unspecified hypotension type        Comer Locket, PA-C 07/30/15 0009  Sharlett Iles, MD 07/30/15 [phone]

## 2015-07-30 ENCOUNTER — Inpatient Hospital Stay (HOSPITAL_COMMUNITY): Payer: BLUE CROSS/BLUE SHIELD

## 2015-07-30 ENCOUNTER — Ambulatory Visit (HOSPITAL_COMMUNITY): Admission: RE | Admit: 2015-07-30 | Payer: BLUE CROSS/BLUE SHIELD | Source: Ambulatory Visit

## 2015-07-30 ENCOUNTER — Telehealth: Payer: Self-pay | Admitting: Medical Oncology

## 2015-07-30 ENCOUNTER — Encounter (HOSPITAL_COMMUNITY): Payer: Self-pay | Admitting: Rehabilitation

## 2015-07-30 LAB — BODY FLUID CELL COUNT WITH DIFFERENTIAL
Eos, Fluid: 4 %
Lymphs, Fluid: 55 %
MONOCYTE-MACROPHAGE-SEROUS FLUID: 30 % — AB (ref 50–90)
Neutrophil Count, Fluid: 11 % (ref 0–25)
Total Nucleated Cell Count, Fluid: 1133 cu mm — ABNORMAL HIGH (ref 0–1000)

## 2015-07-30 LAB — GRAM STAIN

## 2015-07-30 LAB — CBC
HEMATOCRIT: 38 % — AB (ref 39.0–52.0)
Hemoglobin: 12.4 g/dL — ABNORMAL LOW (ref 13.0–17.0)
MCH: 34.3 pg — ABNORMAL HIGH (ref 26.0–34.0)
MCHC: 32.6 g/dL (ref 30.0–36.0)
MCV: 105 fL — ABNORMAL HIGH (ref 78.0–100.0)
PLATELETS: 312 10*3/uL (ref 150–400)
RBC: 3.62 MIL/uL — ABNORMAL LOW (ref 4.22–5.81)
RDW: 15.4 % (ref 11.5–15.5)
WBC: 10.7 10*3/uL — AB (ref 4.0–10.5)

## 2015-07-30 LAB — LACTATE DEHYDROGENASE, PLEURAL OR PERITONEAL FLUID: LD FL: 2024 U/L — AB (ref 3–23)

## 2015-07-30 LAB — BASIC METABOLIC PANEL
Anion gap: 8 (ref 5–15)
BUN: 25 mg/dL — AB (ref 6–20)
CALCIUM: 9.2 mg/dL (ref 8.9–10.3)
CO2: 35 mmol/L — ABNORMAL HIGH (ref 22–32)
Chloride: 97 mmol/L — ABNORMAL LOW (ref 101–111)
Creatinine, Ser: 0.98 mg/dL (ref 0.61–1.24)
GFR calc Af Amer: >60 mL/min (ref >60)
GLUCOSE: 134 mg/dL — AB (ref 65–99)
POTASSIUM: 4 mmol/L (ref 3.5–5.1)
SODIUM: 140 mmol/L (ref 135–145)

## 2015-07-30 LAB — PROTIME-INR
INR: 1.01 (ref 0.00–1.49)
Prothrombin Time: 13.5 seconds (ref 11.6–15.2)

## 2015-07-30 LAB — TYPE AND SCREEN
ABO/RH(D): B POS
ANTIBODY SCREEN: NEGATIVE

## 2015-07-30 LAB — APTT: APTT: 29 s (ref 24–37)

## 2015-07-30 LAB — CORTISOL-AM, BLOOD: CORTISOL - AM: 40.2 ug/dL — AB (ref 6.7–22.6)

## 2015-07-30 LAB — ABO/RH: ABO/RH(D): B POS

## 2015-07-30 LAB — PROTEIN, BODY FLUID: TOTAL PROTEIN, FLUID: 3.2 g/dL

## 2015-07-30 LAB — GLUCOSE, SEROUS FLUID: Glucose, Fluid: <20 mg/dL

## 2015-07-30 LAB — GLUCOSE, CAPILLARY: Glucose-Capillary: 121 mg/dL — ABNORMAL HIGH (ref 65–99)

## 2015-07-30 MED ORDER — IPRATROPIUM-ALBUTEROL 0.5-2.5 (3) MG/3ML IN SOLN
3.0000 mL | Freq: Four times a day (QID) | RESPIRATORY_TRACT | Status: DC
  Administered 2015-07-30 – 2015-08-01 (×9): 3 mL via RESPIRATORY_TRACT
  Filled 2015-07-30 (×8): qty 3

## 2015-07-30 MED ORDER — CHLORHEXIDINE GLUCONATE 0.12 % MT SOLN
15.0000 mL | Freq: Two times a day (BID) | OROMUCOSAL | Status: DC
  Administered 2015-07-30 – 2015-08-01 (×4): 15 mL via OROMUCOSAL
  Filled 2015-07-30 (×7): qty 15

## 2015-07-30 NOTE — Evaluation (Signed)
Physical Therapy Evaluation Patient Details Name: Henry [name] MRN: [id number] DOB: 09-25-1951 Today's Date: 07/30/2015   History of Present Illness  64 y.o. male with PMH of hypertension, metastasized Lung cancer(s/p of chemotherapy), thyroid cancer, adenocarcinoma with unknown origin, bilateral pleural effusion, who presents with hypotension, urinary retention, lower back pain, suprapubic abdominal pain, sob  Clinical Impression  Pt admitted with above diagnosis. Pt currently with functional limitations due to the deficits listed below (see PT Problem List). Pt ambulated 45' x 2 with standing rest break 2* fatigue, SaO2 87% on 2L walking, 92% on 4L O2. Distance limited by fatigue.  Pt will benefit from skilled PT to increase their independence and safety with mobility to allow discharge to the venue listed below.       Follow Up Recommendations Home health PT    Equipment Recommendations  3in1 (PT)    Recommendations for Other Services       Precautions / Restrictions Precautions Precautions: Other (comment) Precaution Comments: monitor O2 Restrictions Weight Bearing Restrictions: No      Mobility  Bed Mobility Overal bed mobility: Modified Independent             General bed mobility comments: with rail, HOB up  Transfers Overall transfer level: Modified independent               General transfer comment: with armrests  Ambulation/Gait Ambulation/Gait assistance: Supervision Ambulation Distance (Feet): 90 Feet (45' x 2 with standing rest break 2* fatigue/SOB) Assistive device: Rolling walker (2 wheeled)       General Gait Details: SaO2 92% on 4L O2 [place] walking, SaO2 87% on 2 L O2, distance limited by fatigue, no LOB,  90 second standing rest break due to fatigue  Stairs            Wheelchair Mobility    Modified Rankin (Stroke Patients Only)       Balance Overall balance assessment: Modified Independent                                           Pertinent Vitals/Pain Pain Assessment: 0-10 Pain Score: 3  Pain Location: LBP Pain Descriptors / Indicators: Sore Pain Intervention(s): Monitored during session;Limited activity within patient's tolerance    Home Living Family/patient expects to be discharged to:: Private residence Living Arrangements: Children Available Help at Discharge: Family;Available PRN/intermittently Type of Home: House Home Access: Stairs to enter Entrance Stairs-Rails: None Entrance Stairs-Number of Steps: 2 Home Layout: One level Home Equipment: Walker - 2 wheels;Cane - single point Additional Comments: needs a BSC, uses 2L O2 at home    Prior Function Level of Independence: Independent               Hand Dominance        Extremity/Trunk Assessment   Upper Extremity Assessment: Overall WFL for tasks assessed           Lower Extremity Assessment: Overall WFL for tasks assessed      Cervical / Trunk Assessment: Normal  Communication   Communication: No difficulties  Cognition Arousal/Alertness: Awake/alert Behavior During Therapy: WFL for tasks assessed/performed Overall Cognitive Status: Within Functional Limits for tasks assessed                      General Comments      Exercises        Assessment/Plan  PT Assessment Patient needs continued PT services  PT Diagnosis Generalized weakness   PT Problem List Decreased activity tolerance;Cardiopulmonary status limiting activity;Pain  PT Treatment Interventions Gait training;Functional mobility training;Therapeutic activities;Therapeutic exercise   PT Goals (Current goals can be found in the Care Plan section) Acute Rehab PT Goals Patient Stated Goal: to not get tired so quickly PT Goal Formulation: With patient Time For Goal Achievement: 08/13/15 Potential to Achieve Goals: Good    Frequency Min 3X/week   Barriers to discharge        Co-evaluation               End of  Session Equipment Utilized During Treatment: Gait belt Activity Tolerance: Patient limited by fatigue Patient left: in chair           Time: 1003-1037 PT Time Calculation (min) (ACUTE ONLY): 34 min   Charges:   PT Evaluation $Initial PT Evaluation Tier I: 1 Procedure PT Treatments $Gait Training: 8-22 mins   PT G Codes:        Philomena Doheny 07/30/2015, 10:49 AM [phone]

## 2015-07-30 NOTE — Telephone Encounter (Signed)
Referred pt to palliative services - Henry [name].

## 2015-07-30 NOTE — Telephone Encounter (Signed)
Gardiner Barefoot notified that form is ready at front for pickup

## 2015-07-30 NOTE — Progress Notes (Addendum)
[facility] PROGRESS NOTE  Henry [name] DTO:[id number] DOB: May 02, 1951 DOA: 07/29/2015 PCP: Donnie Coffin, MD  Assessment/Plan: Principal Problem:   Hypotension Active Problems:   Essential hypertension   Hemochromatosis   Pleural effusion on right   Lung cancer   Protein-calorie malnutrition, severe   Pleural effusion on left   Metastatic adenocarcinoma of unknown origin   Chronic diastolic heart failure   SOB (shortness of breath)   Generalized weakness   AKI (acute kidney injury)   Constipation   Urinary retention   Back pain     Hypotension: Improving, improving, Is likely due to multiple blood pressure medications. He is taking Lasix 40 mg daily, metoprolol 50 mg daily, spironolactone 25 mg daily, Flomax 0.4 mg daily, Diovan 160 mg daily, verapamil 240 g daily. Patient does not seem to have sepsis clinically. His lactate is 0.64. He has a mild leukocytosis with WBC 11, but no fever and tachycardia.  Continue to hold  Lasix, spironolactone, Diovan, verapamil -Decreased metoprolol dose from 50-25 mg daily -give Solu Cortef 50 mg 1 for stress dose  cortisol level- 40.2     AKI: Likely due to prerenal secondary to hypotension and continuation of ARB, diruetics, NSAIDs. No hydronephrosis by CT abdomen/pelvis  Follow up renal function by BMP - Hold Diovan, ibuprofen, and lasix   Essential hypertension: now with hypotension Home medications held, continue metoprolol 25 mg by mouth daily  Shortness of breath and bilateral pleural effusion: Patient's worsening shortness of breath is likely due to worsening L pleural effusion as evidenced with CT chest. Large left pleural effusion appears partially loculated, but patient does not have fever or CP, clinically not septic. Patient scheduled for thoracentesis by IR  Continue - Duoneb Nebs, Dulera inhaler when necessary albuterol nebulizer,- Mucinex for cough - On prednison 10 mg daily, - give Solu Cortef 50 mg 1 for  stress do   Metastasized lung cancer: s/p of chemotherapy. Last dose was 5 weeks ago. Followed up with Dr. Julien Nordmann. Plan to do immunotherapy in the coming Thursday.  notify Dr.  Julien Nordmann of patient's admission. Patient would also need paracentesis for his malignant ascites   Metastatic adenocarcinoma of unknown origin: -Follow-up with Dr. Julien Nordmann  Protein-calorie malnutrition, severe: -Ensure  Chronic diastolic heart failure: 2-D echo on 07/15/15 showed EF of 60-65% with grade 1 diastolic dysfunction. BNP 30.7. Patient has 1+ leg edema. His leg edema is partially from his position since patient has been siting most of the time. CHF is clinically compensated. -Hold Lasix and spironolactone given hypotension -Watched volume status closely  Generalized weakness: Likely due to generalized deconditioning due to multiple comorbidities. -PT/OT  Constipation: -MiraLAX and Colace  Urinary retention: Urinalysis negative. -When necessary in/out catheter -Flomax  Lower back pain: No alarming symptoms, such as leg weakness or tingling sensation. Most likely due to metastasized disease. CT showed unchanged L1 sclerotic vertebral metastasis. -Continue home morphine and oxycodone when necessary -Tylenol    Code Status:      Code Status Orders        Start     Ordered   07/29/15 2214  Do not attempt resuscitation (DNR)   Continuous    Question Answer Comment  In the event of cardiac or respiratory ARREST Do not call a "code blue"   In the event of cardiac or respiratory ARREST Do not perform Intubation, CPR, defibrillation or ACLS   In the event of cardiac or respiratory ARREST Use medication by any  route, position, wound care, and other measures to relive pain and suffering. May use oxygen, suction and manual treatment of airway obstruction as needed for comfort.      07/29/15 2214     Family Communication: family updated about patient's clinical progress Disposition Plan:    Thoracentesis today, paracentesis tomorrow   Brief narrative: HPI: Henry [name] is a 64 y.o. male with PMH of hypertension, metastasized Lung cancer(s/p of chemotherapy), thyroid cancer, adenocarcinoma with unknown origin, bilateral pleural effusion, who presents with hypotension, urinary retention, lower back pain, suprapubic abdominal pain, sob  Patient reports that he has urinary retention which has been going on for almost a year. He was recently started with the Flomax by his oncologist, Dr. Julien Nordmann. He has been compliant to this medication, but still has some difficult urinating. He has urgency, but no dysuria or burning on urination. He complains of mild suprapubic abdominal pain. Sister reports patient has been hypotensive the last 2 days, yesterday systolic pressure was 70Y and only earlier today did pressure rise to 17C systolic. Patient also has abdominal distention. Patient reports severe lower low back pain that also started 2 days ago, without leg weakness or tingling. He reports that has SOB which is slightly worse than his baseline. No chest pain. He has mild cough without sputum production. No fever or chills. He reports that he is constipated recently. Patient reports that his is scheduled for thoracentesis tomorrow.  In ED, patient was found to have WBC 11.0, temperature normal, soft blood pressure with SBP 80 to 90 mmHg, AKI, lactate 0.64, BNP 30.7, negative urinalysis. CT-ches and CT-abd/pelvis showed: 1. increased size of large left pleural effusion which appears partially loculated. Near complete collapse of the left lower lobe. 2. Unchanged, partially loculated right pleural effusion. 3. Unchanged right lower lobe consolidation and nodular densities in the anterior right lung base. 4. Large volume abdominal ascites, increased from prior. 5. Decreased size of polypoid lesions in the ascending colon. 6. Unchanged L1 sclerotic vertebral metastasis. No new bone metastases  identified.  Consultants:  None   Procedures:  None   Antibiotics: Anti-infectives    None       HPI/Subjective: Patient hungry and would like to eat , denies any pain,SOB with ambulation  Objective: Filed Vitals:   07/30/15 0654 07/30/15 0921 07/30/15 1043 07/30/15 1146  BP: 114/76     Pulse:    94  Temp: 97.6 F (36.4 C)     TempSrc: Oral     Resp: 18   17  SpO2: 99% 96% 87% 94%    Intake/Output Summary (Last 24 hours) at 07/30/15 1156 Last data filed at 07/30/15 1100  Gross per 24 hour  Intake      0 ml  Output    500 ml  Net   -500 ml    Exam:  General: No acute respiratory distress Lungs: Clear to auscultation bilaterally without wheezes or crackles Cardiovascular: Regular rate and rhythm without murmur gallop or rub normal S1 and S2 Abdomen: Nontender, nondistended, soft, bowel sounds positive, no rebound, no ascites, no appreciable mass Extremities: No significant cyanosis, clubbing, or edema bilateral lower extremities     Data Review   Micro Results No results found for this or any previous visit (from the past 240 hour(s)).  Radiology Reports Dg Chest 1 View  07/12/2015   CLINICAL DATA:  Status post left-sided thoracentesis.  EXAM: CHEST  1 VIEW  COMPARISON:  June 28, 2014.  FINDINGS: Stable cardiomediastinal  silhouette. No pneumothorax is noted. Minimal right pleural effusion is noted which is increased compared to prior exam. Right basilar opacity is noted which is stable and consistent with scarring or atelectasis. Mild left pleural effusion is noted in left lung base. Increased left upper lobe opacity is noted concerning for possible pneumonia or atelectasis. Mild central pulmonary vascular congestion is noted.  IMPRESSION: Mild central pulmonary vascular congestion. Minimal right pleural effusion is noted. Increased left upper lobe opacity is noted concerning for pneumonia or possibly atelectasis. Mild left pleural effusion is noted in left  lung base status post thoracentesis. No definite pneumothorax is noted.   Electronically Signed   By: Marijo Conception, M.D.   On: 07/12/2015 10:59   Dg Chest 2 View  07/29/2015   CLINICAL DATA:  Shortness of breath. Abdominal distention. History of lung cancer. Recent chemotherapy.  EXAM: CHEST  2 VIEW  COMPARISON:  07/26/2015  FINDINGS: Cardiomediastinal silhouette is grossly unchanged, with the cardiac silhouette being partially obscured. Large left pleural effusion is unchanged with associated compressive left upper and lower lobe atelectasis. Small right pleural effusion also does not appear significantly changed. Patchy and nodular parenchymal opacities in the right lung base are unchanged. No definite new areas of airspace consolidation are seen. There is no pneumothorax. No acute osseous abnormality is seen.  IMPRESSION: Unchanged examination with large left and small right pleural effusions, associated compressive atelectasis, and chronic right basilar opacities as above.   Electronically Signed   By: Logan Bores M.D.   On: 07/29/2015 17:37   Dg Chest 2 View  07/26/2015   CLINICAL DATA:  Metastatic adenocarcinoma. Shortness of breath. Right lung cancer.  EXAM: CHEST  2 VIEW  COMPARISON:  Chest x-rays dated 07/20/2015, 07/14/2015 and 07/12/2015 and CT scan dated 07/10/2015  FINDINGS: There has been slight increase in the large left pleural effusion with further compressive atelectasis of the upper and lower lobes. Ill-defined nodular densities and chronic pleural effusion at the right base and in the right midzone appear unchanged. Pulmonary vascularity is normal.  No acute osseous abnormality.  IMPRESSION: Increased large left pleural effusion with further compression of the left upper and lower lobes. No change on the right.   Electronically Signed   By: Lorriane Shire M.D.   On: 07/26/2015 12:48   Dg Chest 2 View  07/20/2015   CLINICAL DATA:  Follow-up left pleural effusion, persistent shortness of  breath, cough, and congestion. History of lung malignancy, COPD, and previous heavy tobacco use.  EXAM: CHEST  2 VIEW  COMPARISON:  PA and lateral chest x-ray of July 14, 2015  FINDINGS: There is a persistent moderate size left pleural effusion with small right pleural effusion. The pulmonary interstitial markings are slightly less conspicuous today. The cardiac silhouette remains enlarged. The pulmonary vascularity is less engorged. There is stable tortuosity of the descending thoracic aorta. The bony thorax exhibits no acute abnormality.  IMPRESSION: Improvement in CHF with less interstitial edema. Stable moderate size left pleural effusion and small right pleural effusion.   Electronically Signed   By: David  Martinique M.D.   On: 07/20/2015 13:41   Dg Chest 2 View  07/14/2015   CLINICAL DATA:  Shortness of breath today, recent thoracentesis 2 days ago, still short of breath and not feeling much better, history hypertension, smoking, lung cancer, papillary thyroid cancer, chronic pancreatitis, irritable bowel syndrome  EXAM: CHEST  2 VIEW  COMPARISON:  07/12/2015  FINDINGS: Enlargement of cardiac silhouette with pulmonary vascular  congestion.  Bibasilar effusions and atelectasis greater on LEFT increased since previous exam.  Minimal perihilar infiltrates bilaterally likely mild pulmonary edema.  No pneumothorax.  Bones demineralized.  IMPRESSION: Persistent mild CHF with bibasilar effusions atelectasis, increased on LEFT since prior study.   Electronically Signed   By: Lavonia Dana M.D.   On: 07/14/2015 12:57   Dg Chest 2 View  07/11/2015   CLINICAL DATA:  Lung cancer on chemotherapy, increased shortness of breath for 1 week, fluid on lungs, productive cough for 3 months, hypertension  EXAM: CHEST  2 VIEW  COMPARISON:  CT chest 07/10/2015  FINDINGS: Enlargement of cardiac silhouette.  Tortuous aorta.  Mediastinal contours and pulmonary vascularity normal.  Bibasilar effusions LEFT greater than RIGHT similar  to prior CT.  Bibasilar pulmonary opacities may represent infiltrate or atelectasis, little changed from prior CT.  Upper lungs grossly clear.  No pneumothorax.  Bones demineralized.  IMPRESSION: Bibasilar pleural effusions LEFT greater than RIGHT with bibasilar pulmonary opacities that may represent atelectasis or consolidation.  Little interval change.   Electronically Signed   By: Lavonia Dana M.D.   On: 07/11/2015 18:57   Dg Abd 1 View  07/29/2015   CLINICAL DATA:  Shortness of breath and abdominal distension. History of lung cancer with recent chemotherapy.  EXAM: ABDOMEN - 1 VIEW  COMPARISON:  CT abdomen and pelvis 07/10/2015  FINDINGS: There is a small to moderate amount of stool and gas in nondilated colon. There is a paucity of small bowel gas, which limits evaluation for obstruction. Increased density in the abdomen may partially reflect ascites, as was present on the prior CT. The hepatic shadow is incompletely visualized but appears mildly enlarged. The uppermost portion of the abdomen was incompletely imaged. Mild scoliosis and moderate disc degeneration is noted in the lumbar spine.  IMPRESSION: 1. Increased density in the abdomen which may reflect ascites. 2. Small to moderate amount of stool in the colon. A paucity of small bowel gas limits evaluation for small bowel obstruction.   Electronically Signed   By: Logan Bores M.D.   On: 07/29/2015 17:42   Ct Chest W Contrast  07/29/2015   CLINICAL DATA:  Hypotension.  Lung cancer.  EXAM: CT CHEST, ABDOMEN, AND PELVIS WITH CONTRAST  TECHNIQUE: Multidetector CT imaging of the chest, abdomen and pelvis was performed following the standard protocol during bolus administration of intravenous contrast.  CONTRAST:  153m OMNIPAQUE IOHEXOL 300 MG/ML  SOLN  COMPARISON:  CT chest, abdomen, and pelvis 07/10/2015.  FINDINGS: CT CHEST FINDINGS  Small right axillary and subpectoral lymph nodes measure up to 7 mm in short axis, unchanged. No enlarged  supraclavicular lymph nodes are identified. Small, scattered mediastinal lymph nodes measure up to 7 mm in short axis and are stable to minimally smaller than on the prior study. The heart is normal in size and is shifted rightward. Centrilobular emphysema is again noted.  Small right pleural effusion has not significantly changed in size, with loculated components again seen anteriorly and in a subpulmonic location. Increased right-sided pleural density is consistent with prior talc pleurodesis. Consolidative and ground-glass opacity in the right lower lobe has not significantly changed. Nodular densities in the anterior aspect of the basilar right lower lobe and in the right middle lobe are unchanged, measuring up to 2.1 cm in size (series 7, image 40). Diffuse, mild thickening along the right major and minor fissures with areas of mild nodularity is unchanged.  Large left pleural effusion has mildly increased  in size overall with some loculation anteriorly. Small focus of likely loculated pleural fluid posteriorly towards the apex is slightly smaller. There is increased compressive atelectasis on the left lower lobe and lingula, now with near complete left lower lobe collapse. No definite enhancing pleural nodules are identified. 3 mm left upper lobe nodule is unchanged (series 7, image 26). Small focus of chronic scarring in the left upper lobe is unchanged (series 7, image 12). No suspicious lytic or blastic osseous lesions are identified in the chest.  CT ABDOMEN AND PELVIS FINDINGS  The liver is enlarged, measuring approximately 20 cm in craniocaudal length. Small low-density liver lesions are unchanged and compatible with cysts, measuring up to 1.4 cm in size. Gallbladder is grossly unremarkable. Spleen and kidneys are unremarkable. Mild thickening of both adrenal glands is again noted. 13 mm mildly complex/septated cystic lesion in the pancreatic head is unchanged and may represent an IPMN.  Oral contrast  in the distal esophagus may reflect dysmotility or reflux. Oral contrast is present in multiple nondilated loops of small bowel without evidence of obstruction. Three polypoid soft tissue lesions in the ascending colon are less prominent in size than on the most recent prior study and similar in size to the 05/01/2015 examination, measuring up to approximately 1.7 cm (series 2 images 80-85). There is a moderate amount of stool in the colon.  Bladder is unremarkable. Prostate is enlarged. There is mild-to-moderate atherosclerotic calcification of the abdominal aorta and its major branch vessels. 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 The SMV and portal vein appear patent. No enlarged lymph nodes are identified. There is large volume ascites, increased from the prior abdominal CT. Diffuse body wall edema is present. 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 No new suspicious osseous lesions are identified. Advanced disc degeneration is present at L4-5.  IMPRESSION: 1. Increased size of large left pleural effusion which appears partially loculated. Near complete collapse of the left lower lobe. 2. Unchanged, partially loculated right pleural effusion. 3. Unchanged right lower lobe consolidation and nodular densities in the anterior right lung base. 4. Large volume abdominal ascites, increased from prior. 5. Decreased size of polypoid lesions in the ascending colon. 6. Unchanged L1 sclerotic vertebral metastasis. No new bone metastases identified.   Electronically Signed   By: Logan Bores M.D.   On: 07/29/2015 19:30   Ct Chest W Contrast  07/10/2015   CLINICAL DATA:  Restaging lung cancer. Initial diagnosis 2015. History of right lobectomy.  EXAM: CT CHEST, ABDOMEN, AND PELVIS WITH CONTRAST  TECHNIQUE: Multidetector CT imaging of the chest, abdomen and pelvis was performed following the standard protocol during bolus administration of  intravenous contrast.  CONTRAST:  169m OMNIPAQUE IOHEXOL 300 MG/ML  SOLN  COMPARISON:  CT 05/01/2015 and PET-CT 06/30/2014  FINDINGS: CT CHEST FINDINGS  Chest wall: Stable small axillary lymph nodes but no mass or overt adenopathy. The thyroid gland appears Stable. Stable small nodules. No supraclavicular adenopathy. The bony thorax is intact. No obvious metastatic lesions involving the thoracic spine or sternum. No obvious rib lesions. Progressive metastatic disease involving the L1 vertebral body.  Mediastinum: The heart is mildly enlarged but stable. Stable tortuosity, ectasia and calcification of the thoracic aorta. Stable small scattered mediastinal lymph nodes. The esophagus is grossly normal.  Lungs/ pleura: Persistent loculated pleural fluid collection on the right side with density along the pleural surface likely due to previous talc pleurodesis. Persistent overlying long atelectasis  and right basilar airspace consolidation. Stable bilobed masslike densities in the right middle lobe and right lower lobe on image number 42.  There is a new large left pleural effusion with areas of probable loculation. I do not see any obvious enhancing pleural nodules. A left-sided thoracentesis may be useful for therapeutic and diagnostic purposes and to exclude malignant left effusion.  CT ABDOMEN AND PELVIS FINDINGS  Hepatobiliary: Stable scattered hepatic cysts. No findings suspicious for hepatic metastatic disease. The portal vein is patent.  Pancreas: Stable small cystic lesion in the pancreatic head. No inflammation or ductal dilatation.  Spleen: Normal size.  No focal lesions.  Adrenals/Urinary Tract: Bilateral nodular enlargement of both adrenal glands which could reflect nodular hyperplasia. No obvious mass/metastasis. The kidneys are unremarkable and stable.  Stomach/Bowel: The stomach wall is thickened but this is likely due to under distension. No mass or obstruction. The small bowel is grossly normal. No  inflammation or obstruction. Enlarging Masses involving the ascending colon. These could be adenomas or possible metastasis.  Vascular/Lymphatic: Stable tortuosity, ectasia and calcification of the abdominal aorta. The branch vessels are patent. 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 The SMA may also be partially occluded/thrombosed. No mesenteric or retroperitoneal mass or adenopathy. Small scattered lymph nodes are noted.  Other: Progression of abdominal/ pelvic ascites without obvious omental or peritoneal surface implants. The bladder appears normal. No pelvic mass or adenopathy. No inguinal mass or adenopathy.  Musculoskeletal: No significant bony findings in the pelvis. Progressive L1 sclerotic metastatic disease.  IMPRESSION: 1. Persistent right-sided loculated pleural fluid collection and evidence of prior talc pleurodesis. No obvious enhancing pleural masses. Persistent right basilar airspace consolidation and stable bilobed masslike densities in the right middle lobe and right lower lobe. 2. New large left pleural effusion with areas of probable loculation. No obvious enhancing pleural nodules but there left-sided thoracentesis may be useful for diagnostic and therapeutic purposes. 3. Progressive abdominal/pelvic ascites. 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 4. Stable nodular enlargement of both adrenal glands. 5. Enlarging soft tissue masses involving the ascending colon. These could be adenomas or metastasis. 6. Progressive sclerotic metastatic disease involving the L1 vertebral body. No definite new bone metastasis.   Electronically Signed   By: Marijo Sanes M.D.   On: 07/10/2015 14:28   Ct Abdomen Pelvis W Contrast  07/29/2015   CLINICAL DATA:  Hypotension.  Lung cancer.  EXAM: CT CHEST, ABDOMEN, AND PELVIS WITH CONTRAST  TECHNIQUE:  Multidetector CT imaging of the chest, abdomen and pelvis was performed following the standard protocol during bolus administration of intravenous contrast.  CONTRAST:  156m OMNIPAQUE IOHEXOL 300 MG/ML  SOLN  COMPARISON:  CT chest, abdomen, and pelvis 07/10/2015.  FINDINGS: CT CHEST FINDINGS  Small right axillary and subpectoral lymph nodes measure up to 7 mm in short axis, unchanged. No enlarged supraclavicular lymph nodes are identified. Small, scattered mediastinal lymph nodes measure up to 7 mm in short axis and are stable to minimally smaller than on the prior study. The heart is normal in size and is shifted rightward. Centrilobular emphysema is again noted.  Small right pleural effusion has not significantly changed in size, with loculated components again seen anteriorly and in a subpulmonic location. Increased right-sided pleural density is consistent with prior talc pleurodesis. Consolidative and ground-glass opacity in the right lower lobe has not significantly changed. Nodular densities in the anterior aspect of the basilar  right lower lobe and in the right middle lobe are unchanged, measuring up to 2.1 cm in size (series 7, image 40). Diffuse, mild thickening along the right major and minor fissures with areas of mild nodularity is unchanged.  Large left pleural effusion has mildly increased in size overall with some loculation anteriorly. Small focus of likely loculated pleural fluid posteriorly towards the apex is slightly smaller. There is increased compressive atelectasis on the left lower lobe and lingula, now with near complete left lower lobe collapse. No definite enhancing pleural nodules are identified. 3 mm left upper lobe nodule is unchanged (series 7, image 26). Small focus of chronic scarring in the left upper lobe is unchanged (series 7, image 12). No suspicious lytic or blastic osseous lesions are identified in the chest.  CT ABDOMEN AND PELVIS FINDINGS  The liver is enlarged, measuring  approximately 20 cm in craniocaudal length. Small low-density liver lesions are unchanged and compatible with cysts, measuring up to 1.4 cm in size. Gallbladder is grossly unremarkable. Spleen and kidneys are unremarkable. Mild thickening of both adrenal glands is again noted. 13 mm mildly complex/septated cystic lesion in the pancreatic head is unchanged and may represent an IPMN.  Oral contrast in the distal esophagus may reflect dysmotility or reflux. Oral contrast is present in multiple nondilated loops of small bowel without evidence of obstruction. Three polypoid soft tissue lesions in the ascending colon are less prominent in size than on the most recent prior study and similar in size to the 05/01/2015 examination, measuring up to approximately 1.7 cm (series 2 images 80-85). There is a moderate amount of stool in the colon.  Bladder is unremarkable. Prostate is enlarged. There is mild-to-moderate atherosclerotic calcification of the abdominal aorta and its major branch vessels. 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 The SMV and portal vein appear patent. No enlarged lymph nodes are identified. There is large volume ascites, increased from the prior abdominal CT. Diffuse body wall edema is present. 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 No new suspicious osseous lesions are identified. Advanced disc degeneration is present at L4-5.  IMPRESSION: 1. Increased size of large left pleural effusion which appears partially loculated. Near complete collapse of the left lower lobe. 2. Unchanged, partially loculated right pleural effusion. 3. Unchanged right lower lobe consolidation and nodular densities in the anterior right lung base. 4. Large volume abdominal ascites, increased from prior. 5. Decreased size of polypoid lesions in the ascending colon. 6. Unchanged L1 sclerotic vertebral metastasis. No new bone metastases identified.    Electronically Signed   By: Logan Bores M.D.   On: 07/29/2015 19:30   Ct Abdomen Pelvis W Contrast  07/10/2015   CLINICAL DATA:  Restaging lung cancer. Initial diagnosis 2015. History of right lobectomy.  EXAM: CT CHEST, ABDOMEN, AND PELVIS WITH CONTRAST  TECHNIQUE: Multidetector CT imaging of the chest, abdomen and pelvis was performed following the standard protocol during bolus administration of intravenous contrast.  CONTRAST:  116m OMNIPAQUE IOHEXOL 300 MG/ML  SOLN  COMPARISON:  CT 05/01/2015 and PET-CT 06/30/2014  FINDINGS: CT CHEST FINDINGS  Chest wall: Stable small axillary lymph nodes but no mass or overt adenopathy. The thyroid gland appears Stable. Stable small nodules. No supraclavicular adenopathy. The bony thorax is intact. No obvious metastatic lesions involving the thoracic spine or sternum. No obvious rib lesions. Progressive metastatic disease involving the L1 vertebral body.  Mediastinum: The heart is mildly enlarged but stable.  Stable tortuosity, ectasia and calcification of the thoracic aorta. Stable small scattered mediastinal lymph nodes. The esophagus is grossly normal.  Lungs/ pleura: Persistent loculated pleural fluid collection on the right side with density along the pleural surface likely due to previous talc pleurodesis. Persistent overlying long atelectasis and right basilar airspace consolidation. Stable bilobed masslike densities in the right middle lobe and right lower lobe on image number 42.  There is a new large left pleural effusion with areas of probable loculation. I do not see any obvious enhancing pleural nodules. A left-sided thoracentesis may be useful for therapeutic and diagnostic purposes and to exclude malignant left effusion.  CT ABDOMEN AND PELVIS FINDINGS  Hepatobiliary: Stable scattered hepatic cysts. No findings suspicious for hepatic metastatic disease. The portal vein is patent.  Pancreas: Stable small cystic lesion in the pancreatic head. No inflammation  or ductal dilatation.  Spleen: Normal size.  No focal lesions.  Adrenals/Urinary Tract: Bilateral nodular enlargement of both adrenal glands which could reflect nodular hyperplasia. No obvious mass/metastasis. The kidneys are unremarkable and stable.  Stomach/Bowel: The stomach wall is thickened but this is likely due to under distension. No mass or obstruction. The small bowel is grossly normal. No inflammation or obstruction. Enlarging Masses involving the ascending colon. These could be adenomas or possible metastasis.  Vascular/Lymphatic: Stable tortuosity, ectasia and calcification of the abdominal aorta. The branch vessels are patent. 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 The SMA may also be partially occluded/thrombosed. No mesenteric or retroperitoneal mass or adenopathy. Small scattered lymph nodes are noted.  Other: Progression of abdominal/ pelvic ascites without obvious omental or peritoneal surface implants. The bladder appears normal. No pelvic mass or adenopathy. No inguinal mass or adenopathy.  Musculoskeletal: No significant bony findings in the pelvis. Progressive L1 sclerotic metastatic disease.  IMPRESSION: 1. Persistent right-sided loculated pleural fluid collection and evidence of prior talc pleurodesis. No obvious enhancing pleural masses. Persistent right basilar airspace consolidation and stable bilobed masslike densities in the right middle lobe and right lower lobe. 2. New large left pleural effusion with areas of probable loculation. No obvious enhancing pleural nodules but there left-sided thoracentesis may be useful for diagnostic and therapeutic purposes. 3. Progressive abdominal/pelvic ascites. 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 4. Stable nodular enlargement of both adrenal glands. 5. Enlarging soft tissue masses involving  the ascending colon. These could be adenomas or metastasis. 6. Progressive sclerotic metastatic disease involving the L1 vertebral body. No definite new bone metastasis.   Electronically Signed   By: Marijo Sanes M.D.   On: 07/10/2015 14:28   US Paracentesis  07/13/2015   INDICATION: Ascites, request for diagnostic and therapeutic paracentesis.  EXAM: ULTRASOUND-GUIDED PARACENTESIS  COMPARISON:  None.  MEDICATIONS: None.  COMPLICATIONS: None immediate  TECHNIQUE: Informed written consent was obtained from the patient after a discussion of the risks, benefits and alternatives to treatment. A timeout was performed prior to the initiation of the procedure.  Initial ultrasound scanning demonstrates a small amount of ascites within the right upper abdominal quadrant. The right upper abdomen was prepped and draped in the usual sterile fashion. 1% lidocaine was used for local anesthesia.  Under direct ultrasound guidance, a 19 gauge, 7-cm, Yueh catheter was introduced. An ultrasound image was saved for documentation purposed. The paracentesis was performed. The catheter was removed and a dressing was applied. The patient tolerated the procedure well without immediate post procedural  complication.  FINDINGS: A total of approximately 600 ml of serous fluid was removed. Samples were sent to the laboratory as requested by the clinical team.  IMPRESSION: Successful ultrasound-guided paracentesis yielding 600 ml of peritoneal fluid.  Read By:  Tsosie Billing PA-C   Electronically Signed   By: Jerilynn Mages.  Shick M.D.   On: 07/13/2015 14:39   US Thoracentesis Asp Pleural Space W/img Guide  07/12/2015   INDICATION: Patient with history of lung and thyroid cancers, dyspnea, left pleural effusion. Request is made for diagnostic and therapeutic left thoracentesis.  EXAM: ULTRASOUND GUIDED DIAGNOSTIC AND THERAPEUTIC LEFT THORACENTESIS  COMPARISON:  None.  MEDICATIONS: None  COMPLICATIONS: None immediate  TECHNIQUE: Informed written  consent was obtained from the patient after a discussion of the risks, benefits and alternatives to treatment. A timeout was performed prior to the initiation of the procedure.  Initial ultrasound scanning demonstrates a moderate to large left pleural effusion. The lower chest was prepped and draped in the usual sterile fashion. 1% lidocaine was used for local anesthesia.  An ultrasound image was saved for documentation purposes. A 6 Fr Safe-T-Centesis catheter was introduced. The thoracentesis was performed. The catheter was removed and a dressing was applied. The patient tolerated the procedure well without immediate post procedural complication. The patient was escorted to have an upright chest radiograph.  FINDINGS: A total of approximately 1 liter of turbid, dark bloody fluid was removed. Requested samples were sent to the laboratory.  IMPRESSION: Successful ultrasound-guided diagnostic/therapeutic left sided thoracentesis yielding 1 liter of pleural fluid.  Read by: Rowe Robert, PA-C   Electronically Signed   By: Aletta Edouard M.D.   On: 07/12/2015 11:05     CBC  Recent Labs Lab 07/26/15 1009 07/29/15 1652 07/30/15 0614  WBC 10.4* 11.0* 10.7*  HGB 13.0 12.7* 12.4*  HCT 39.1 38.4* 38.0*  PLT 323 359 312  MCV 103.7* 104.9* 105.0*  MCH 34.5* 34.7* 34.3*  MCHC 33.2 33.1 32.6  RDW 15.2* 15.5 15.4  LYMPHSABS 1.4 1.2  --   MONOABS 0.9 1.1*  --   EOSABS 0.1 0.1  --   BASOSABS 0.0 0.0  --     Chemistries   Recent Labs Lab 07/26/15 1009 07/29/15 1652 07/29/15 1711 07/30/15 0614  NA 140 140  --  140  K 3.9 3.8  --  4.0  CL  --  96*  --  97*  CO2 37* 37*  --  35*  GLUCOSE 163* 119*  --  134*  BUN 26.0 30*  --  25*  CREATININE 1.0 1.10 1.30* 0.98  CALCIUM 9.4 9.2  --  9.2  AST 23 23  --   --   ALT 19 24  --   --   ALKPHOS 70 62  --   --   BILITOT 0.74 0.6  --   --     ------------------------------------------------------------------------------------------------------------------ estimated creatinine clearance is 76.6 mL/min (by C-G formula based on Cr of 0.98). ------------------------------------------------------------------------------------------------------------------ No results for input(s): HGBA1C in the last 72 hours. ------------------------------------------------------------------------------------------------------------------ No results for input(s): CHOL, HDL, LDLCALC, TRIG, CHOLHDL, LDLDIRECT in the last 72 hours. ------------------------------------------------------------------------------------------------------------------ No results for input(s): TSH, T4TOTAL, T3FREE, THYROIDAB in the last 72 hours.  Invalid input(s): FREET3 ------------------------------------------------------------------------------------------------------------------ No results for input(s): VITAMINB12, FOLATE, FERRITIN, TIBC, IRON, RETICCTPCT in the last 72 hours.  Coagulation profile  Recent Labs Lab 07/29/15 2340  INR 1.01    No results for input(s): DDIMER in the last 72 hours.  Cardiac Enzymes No results  for input(s): CKMB, TROPONINI, MYOGLOBIN in the last 168 hours.  Invalid input(s): CK ------------------------------------------------------------------------------------------------------------------ Invalid input(s): POCBNP   CBG:  Recent Labs Lab 07/30/15 0754  GLUCAP 121*       Studies: Dg Chest 2 View  07/29/2015   CLINICAL DATA:  Shortness of breath. Abdominal distention. History of lung cancer. Recent chemotherapy.  EXAM: CHEST  2 VIEW  COMPARISON:  07/26/2015  FINDINGS: Cardiomediastinal silhouette is grossly unchanged, with the cardiac silhouette being partially obscured. Large left pleural effusion is unchanged with associated compressive left upper and lower lobe atelectasis. Small right pleural effusion also does not  appear significantly changed. Patchy and nodular parenchymal opacities in the right lung base are unchanged. No definite new areas of airspace consolidation are seen. There is no pneumothorax. No acute osseous abnormality is seen.  IMPRESSION: Unchanged examination with large left and small right pleural effusions, associated compressive atelectasis, and chronic right basilar opacities as above.   Electronically Signed   By: Logan Bores M.D.   On: 07/29/2015 17:37   Dg Abd 1 View  07/29/2015   CLINICAL DATA:  Shortness of breath and abdominal distension. History of lung cancer with recent chemotherapy.  EXAM: ABDOMEN - 1 VIEW  COMPARISON:  CT abdomen and pelvis 07/10/2015  FINDINGS: There is a small to moderate amount of stool and gas in nondilated colon. There is a paucity of small bowel gas, which limits evaluation for obstruction. Increased density in the abdomen may partially reflect ascites, as was present on the prior CT. The hepatic shadow is incompletely visualized but appears mildly enlarged. The uppermost portion of the abdomen was incompletely imaged. Mild scoliosis and moderate disc degeneration is noted in the lumbar spine.  IMPRESSION: 1. Increased density in the abdomen which may reflect ascites. 2. Small to moderate amount of stool in the colon. A paucity of small bowel gas limits evaluation for small bowel obstruction.   Electronically Signed   By: Logan Bores M.D.   On: 07/29/2015 17:42   Ct Chest W Contrast  07/29/2015   CLINICAL DATA:  Hypotension.  Lung cancer.  EXAM: CT CHEST, ABDOMEN, AND PELVIS WITH CONTRAST  TECHNIQUE: Multidetector CT imaging of the chest, abdomen and pelvis was performed following the standard protocol during bolus administration of intravenous contrast.  CONTRAST:  175m OMNIPAQUE IOHEXOL 300 MG/ML  SOLN  COMPARISON:  CT chest, abdomen, and pelvis 07/10/2015.  FINDINGS: CT CHEST FINDINGS  Small right axillary and subpectoral lymph nodes measure up to 7 mm in short  axis, unchanged. No enlarged supraclavicular lymph nodes are identified. Small, scattered mediastinal lymph nodes measure up to 7 mm in short axis and are stable to minimally smaller than on the prior study. The heart is normal in size and is shifted rightward. Centrilobular emphysema is again noted.  Small right pleural effusion has not significantly changed in size, with loculated components again seen anteriorly and in a subpulmonic location. Increased right-sided pleural density is consistent with prior talc pleurodesis. Consolidative and ground-glass opacity in the right lower lobe has not significantly changed. Nodular densities in the anterior aspect of the basilar right lower lobe and in the right middle lobe are unchanged, measuring up to 2.1 cm in size (series 7, image 40). Diffuse, mild thickening along the right major and minor fissures with areas of mild nodularity is unchanged.  Large left pleural effusion has mildly increased in size overall with some loculation anteriorly. Small focus of likely loculated pleural fluid posteriorly towards the apex  is slightly smaller. There is increased compressive atelectasis on the left lower lobe and lingula, now with near complete left lower lobe collapse. No definite enhancing pleural nodules are identified. 3 mm left upper lobe nodule is unchanged (series 7, image 26). Small focus of chronic scarring in the left upper lobe is unchanged (series 7, image 12). No suspicious lytic or blastic osseous lesions are identified in the chest.  CT ABDOMEN AND PELVIS FINDINGS  The liver is enlarged, measuring approximately 20 cm in craniocaudal length. Small low-density liver lesions are unchanged and compatible with cysts, measuring up to 1.4 cm in size. Gallbladder is grossly unremarkable. Spleen and kidneys are unremarkable. Mild thickening of both adrenal glands is again noted. 13 mm mildly complex/septated cystic lesion in the pancreatic head is unchanged and may  represent an IPMN.  Oral contrast in the distal esophagus may reflect dysmotility or reflux. Oral contrast is present in multiple nondilated loops of small bowel without evidence of obstruction. Three polypoid soft tissue lesions in the ascending colon are less prominent in size than on the most recent prior study and similar in size to the 05/01/2015 examination, measuring up to approximately 1.7 cm (series 2 images 80-85). There is a moderate amount of stool in the colon.  Bladder is unremarkable. Prostate is enlarged. There is mild-to-moderate atherosclerotic calcification of the abdominal aorta and its major branch vessels. 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 The SMV and portal vein appear patent. No enlarged lymph nodes are identified. There is large volume ascites, increased from the prior abdominal CT. Diffuse body wall edema is present. 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 No new suspicious osseous lesions are identified. Advanced disc degeneration is present at L4-5.  IMPRESSION: 1. Increased size of large left pleural effusion which appears partially loculated. Near complete collapse of the left lower lobe. 2. Unchanged, partially loculated right pleural effusion. 3. Unchanged right lower lobe consolidation and nodular densities in the anterior right lung base. 4. Large volume abdominal ascites, increased from prior. 5. Decreased size of polypoid lesions in the ascending colon. 6. Unchanged L1 sclerotic vertebral metastasis. No new bone metastases identified.   Electronically Signed   By: Logan Bores M.D.   On: 07/29/2015 19:30   Ct Abdomen Pelvis W Contrast  07/29/2015   CLINICAL DATA:  Hypotension.  Lung cancer.  EXAM: CT CHEST, ABDOMEN, AND PELVIS WITH CONTRAST  TECHNIQUE: Multidetector CT imaging of the chest, abdomen and pelvis was performed following the standard protocol during bolus administration of  intravenous contrast.  CONTRAST:  115m OMNIPAQUE IOHEXOL 300 MG/ML  SOLN  COMPARISON:  CT chest, abdomen, and pelvis 07/10/2015.  FINDINGS: CT CHEST FINDINGS  Small right axillary and subpectoral lymph nodes measure up to 7 mm in short axis, unchanged. No enlarged supraclavicular lymph nodes are identified. Small, scattered mediastinal lymph nodes measure up to 7 mm in short axis and are stable to minimally smaller than on the prior study. The heart is normal in size and is shifted rightward. Centrilobular emphysema is again noted.  Small right pleural effusion has not significantly changed in size, with loculated components again seen anteriorly and in a subpulmonic location. Increased right-sided pleural density is consistent with prior talc pleurodesis. Consolidative and ground-glass opacity in the right lower lobe has not significantly changed. Nodular densities in the anterior aspect of the basilar right lower lobe and in the right middle lobe are unchanged, measuring up to  2.1 cm in size (series 7, image 40). Diffuse, mild thickening along the right major and minor fissures with areas of mild nodularity is unchanged.  Large left pleural effusion has mildly increased in size overall with some loculation anteriorly. Small focus of likely loculated pleural fluid posteriorly towards the apex is slightly smaller. There is increased compressive atelectasis on the left lower lobe and lingula, now with near complete left lower lobe collapse. No definite enhancing pleural nodules are identified. 3 mm left upper lobe nodule is unchanged (series 7, image 26). Small focus of chronic scarring in the left upper lobe is unchanged (series 7, image 12). No suspicious lytic or blastic osseous lesions are identified in the chest.  CT ABDOMEN AND PELVIS FINDINGS  The liver is enlarged, measuring approximately 20 cm in craniocaudal length. Small low-density liver lesions are unchanged and compatible with cysts, measuring up to  1.4 cm in size. Gallbladder is grossly unremarkable. Spleen and kidneys are unremarkable. Mild thickening of both adrenal glands is again noted. 13 mm mildly complex/septated cystic lesion in the pancreatic head is unchanged and may represent an IPMN.  Oral contrast in the distal esophagus may reflect dysmotility or reflux. Oral contrast is present in multiple nondilated loops of small bowel without evidence of obstruction. Three polypoid soft tissue lesions in the ascending colon are less prominent in size than on the most recent prior study and similar in size to the 05/01/2015 examination, measuring up to approximately 1.7 cm (series 2 images 80-85). There is a moderate amount of stool in the colon.  Bladder is unremarkable. Prostate is enlarged. There is mild-to-moderate atherosclerotic calcification of the abdominal aorta and its major branch vessels. 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 The SMV and portal vein appear patent. No enlarged lymph nodes are identified. There is large volume ascites, increased from the prior abdominal CT. Diffuse body wall edema is present. 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 No new suspicious osseous lesions are identified. Advanced disc degeneration is present at L4-5.  IMPRESSION: 1. Increased size of large left pleural effusion which appears partially loculated. Near complete collapse of the left lower lobe. 2. Unchanged, partially loculated right pleural effusion. 3. Unchanged right lower lobe consolidation and nodular densities in the anterior right lung base. 4. Large volume abdominal ascites, increased from prior. 5. Decreased size of polypoid lesions in the ascending colon. 6. Unchanged L1 sclerotic vertebral metastasis. No new bone metastases identified.   Electronically Signed   By: Logan Bores M.D.   On: 07/29/2015 19:30      No results found for: HGBA1C Lab Results  Component  Value Date   CREATININE 0.98 07/30/2015       Scheduled Meds: . docusate sodium  100 mg Oral BID  . feeding supplement (ENSURE ENLIVE)  237 mL Oral BID BM  . folic acid  1 mg Oral Daily  . ipratropium-albuterol  3 mL Nebulization QID  . metoprolol succinate  25 mg Oral QPC lunch  . mometasone-formoterol  1 puff Inhalation BID  . predniSONE  10 mg Oral Q breakfast  . sodium chloride  3 mL Intravenous Q12H  . tamsulosin  0.4 mg Oral Daily   Continuous Infusions:   Principal Problem:   Hypotension Active Problems:   Essential hypertension   Hemochromatosis   Pleural effusion on right   Lung cancer   Protein-calorie malnutrition, severe   Pleural effusion on left   Metastatic  adenocarcinoma of unknown origin   Chronic diastolic heart failure   SOB (shortness of breath)   Generalized weakness   AKI (acute kidney injury)   Constipation   Urinary retention   Back pain    Time spent: 45 minutes   [facility] Pager [phone]. If 7PM-7AM, please contact night-coverage at [email or web address], password [facility] 07/30/2015, 11:56 AM  LOS: 1 day

## 2015-07-30 NOTE — Care Management Note (Signed)
Case Management Note  Patient Details  Name: TYQUAN CARMICKLE MRN: [id number] Date of Birth: [birth year]  Subjective/Objective: 64 y/o m admitted w/hypotension. From home. Has home 02-[facility]. [facility] chosen by patient. [facility] rep Kristen aware of referral, & HHPT order.[facility] dme rep Lecretia aware of 3n1 order.                   Action/Plan:d/c plan home w/HHC/DME.   Expected Discharge Date:   (unknown)               Expected Discharge Plan:  [place]  In-House Referral:     Discharge planning Services  CM Consult  Post Acute Care Choice:    Choice offered to:  Patient  DME Arranged:  3-N-1 DME Agency:  [place]:  PT [place]:  [place]  Status of Service:  In process, will continue to follow  Medicare Important Message Given:    Date Medicare IM Given:    Medicare IM give by:    Date Additional Medicare IM Given:    Additional Medicare Important Message give by:     If discussed at [place] of Stay Meetings, dates discussed:    Additional Comments:  Dessa Phi, RN 07/30/2015, 5:33 PM

## 2015-07-30 NOTE — Procedures (Signed)
US guided diagnostic/therapeutic left thoracentesis performed yielding 820 cc dark bloody fluid. The fluid was sent to the lab for preordered studies. F/u CXR pending. Only the above amount of fluid was removed at this time secondary to pt's coughing, chest discomfort and hypotension. No immediate complications.

## 2015-07-30 NOTE — Care Management Note (Signed)
Case Management Note  Patient Details  Name: Henry [name] MRN: [id number] Date of Birth: 07-19-1951  Subjective/Objective:  64 y/o m admitted w/Hypotension. Readmit 8/24-8/28-Pleural Effusion. From home.PT-recc HHPT, 3n1.Will provide [facility] provider list.Await choice. MD Please put in HHPT & face to face order, & home 3n1.                  Action/Plan:d/c plan home w/HHC/DME.   Expected Discharge Date:   (unknown)               Expected Discharge Plan:  [place]  In-House Referral:     Discharge planning Services  CM Consult  Post Acute Care Choice:    Choice offered to:  Patient  DME Arranged:    DME Agency:     HH Arranged:    [place] Agency:     Status of Service:  In process, will continue to follow  Medicare Important Message Given:    Date Medicare IM Given:    Medicare IM give by:    Date Additional Medicare IM Given:    Additional Medicare Important Message give by:     If discussed at [place] of Stay Meetings, dates discussed:    Additional Comments:  Dessa Phi, RN 07/30/2015, 12:04 PM

## 2015-07-30 NOTE — Evaluation (Signed)
Occupational Therapy Evaluation Patient Details Name: Henry [name] MRN: [id number] DOB: 07/31/1951 Today's Date: 07/30/2015    History of Present Illness 64 y.o. male with PMH of hypertension, metastasized Lung cancer(s/p of chemotherapy), thyroid cancer, adenocarcinoma with unknown origin, bilateral pleural effusion, who presents with hypotension, urinary retention, lower back pain, suprapubic abdominal pain, sob   Clinical Impression   Discussed and educated on energy conservation techniques at length during session. Demonstrated all AE as options for conserving energy also and discussed 3in1 and a tubseat. Will follow on acute to progress ADL independence for return home with son.    Follow Up Recommendations  No OT follow up;Supervision - Intermittent    Equipment Recommendations  3 in 1 bedside comode;Tub/shower seat (tubseat if pt desires to get one)    Recommendations for Other Services       Precautions / Restrictions Precautions Precautions: Other (comment) Precaution Comments: monitor O2 and BP Restrictions Weight Bearing Restrictions: No      Mobility Bed Mobility Overal bed mobility: Modified Independent             General bed mobility comments: with rail, HOB up  Transfers Overall transfer level: Modified independent               General transfer comment: with armrests    Balance Overall balance assessment: Modified Independent                                          ADL Overall ADL's : Needs assistance/impaired Eating/Feeding: Independent;Sitting   Grooming: Wash/dry hands;Set up;Sitting   Upper Body Bathing: Set up;Sitting   Lower Body Bathing: Minimal assistance;Sit to/from stand   Upper Body Dressing : Set up;Sitting   Lower Body Dressing: Minimal assistance;Sit to/from stand   Toilet Transfer: Min guard;Stand-pivot;RW   Toileting- Water quality scientist and Hygiene: Min guard;Sit to/from stand          General ADL Comments: Discussed at length energy conservation strategies including some AE options. He is able to cross [place] up but discussed option of LHS to make bathing easier. Also discussed reacher and uses as well as other AE. He would like a 3in1 since his toilet is low and this would help with energy conservation also. Educated on tubseat as an option also to make bathing easier and pt states he would consider a seat. O2 on 3L in room in upper 90s sitting EOC. BP sitting 98/68, standing 84/59 and supine 101/63. Informed nursing.      Vision     Perception     Praxis      Pertinent Vitals/Pain Pain Assessment: Faces Pain Score: 3  Faces Pain Scale: Hurts a little bit Pain Location: low back. Pain Descriptors / Indicators: Sore Pain Intervention(s): Repositioned     Hand Dominance     Extremity/Trunk Assessment Upper Extremity Assessment Upper Extremity Assessment: Overall WFL for tasks assessed      Cervical / Trunk Assessment Cervical / Trunk Assessment: Normal   Communication Communication Communication: No difficulties   Cognition Arousal/Alertness: Awake/alert Behavior During Therapy: WFL for tasks assessed/performed Overall Cognitive Status: Within Functional Limits for tasks assessed                     General Comments       Exercises       Shoulder Instructions      Home  Living Family/patient expects to be discharged to:: Private residence Living Arrangements: Children Available Help at Discharge: Family;Available PRN/intermittently Type of Home: House Home Access: Stairs to enter [employer] of Steps: 2 Entrance Stairs-Rails: None Home Layout: One level     Bathroom Shower/Tub: Teacher, early years/pre: Standard     Home Equipment: Environmental consultant - 2 wheels;Cane - single point   Additional Comments: needs a BSC, uses 2L O2 at home; pt states his O2 tank only goes up to 2L O2. Informed nursing.      Prior  Functioning/Environment Level of Independence: Independent             OT Diagnosis: Generalized weakness   OT Problem List: Decreased strength;Decreased knowledge of use of DME or AE   OT Treatment/Interventions: Self-care/ADL training;Patient/family education;Therapeutic activities;DME and/or AE instruction    OT Goals(Current goals can be found in the care plan section) Acute Rehab OT Goals Patient Stated Goal: be able to do things without getting short of breath. OT Goal Formulation: With patient Time For Goal Achievement: 08/13/15 Potential to Achieve Goals: Good  OT Frequency: Min 2X/week   Barriers to D/C:            Co-evaluation              End of Session Equipment Utilized During Treatment: Rolling walker;Oxygen  Activity Tolerance: Patient tolerated treatment well Patient left: in bed;with call bell/phone within reach   Time: 1250-1320 OT Time Calculation (min): 30 min Charges:  OT General Charges $OT Visit: 1 Procedure OT Evaluation $Initial OT Evaluation Tier I: 1 Procedure OT Treatments $Self Care/Home Management : 8-22 mins G-Codes:    Jules Schick  [phone] 07/30/2015, 2:04 PM

## 2015-07-31 ENCOUNTER — Inpatient Hospital Stay (HOSPITAL_COMMUNITY): Payer: BLUE CROSS/BLUE SHIELD

## 2015-07-31 ENCOUNTER — Ambulatory Visit: Payer: BLUE CROSS/BLUE SHIELD

## 2015-07-31 LAB — AMYLASE, PLEURAL FLUID: Amylase, Pleural Fluid: 9 U/L

## 2015-07-31 LAB — COMPREHENSIVE METABOLIC PANEL
ALK PHOS: 60 U/L (ref 38–126)
ALT: 18 U/L (ref 17–63)
ANION GAP: 4 — AB (ref 5–15)
AST: 19 U/L (ref 15–41)
Albumin: 2.9 g/dL — ABNORMAL LOW (ref 3.5–5.0)
BILIRUBIN TOTAL: 0.5 mg/dL (ref 0.3–1.2)
BUN: 21 mg/dL — ABNORMAL HIGH (ref 6–20)
CALCIUM: 9.1 mg/dL (ref 8.9–10.3)
CO2: 36 mmol/L — AB (ref 22–32)
Chloride: 100 mmol/L — ABNORMAL LOW (ref 101–111)
Creatinine, Ser: 0.84 mg/dL (ref 0.61–1.24)
GFR calc non Af Amer: >60 mL/min (ref >60)
Glucose, Bld: 109 mg/dL — ABNORMAL HIGH (ref 65–99)
POTASSIUM: 3.7 mmol/L (ref 3.5–5.1)
SODIUM: 140 mmol/L (ref 135–145)
TOTAL PROTEIN: 5.9 g/dL — AB (ref 6.5–8.1)

## 2015-07-31 LAB — CBC
HCT: 36.8 % — ABNORMAL LOW (ref 39.0–52.0)
HEMOGLOBIN: 12.2 g/dL — AB (ref 13.0–17.0)
MCH: 34.5 pg — AB (ref 26.0–34.0)
MCHC: 33.2 g/dL (ref 30.0–36.0)
MCV: 104 fL — ABNORMAL HIGH (ref 78.0–100.0)
Platelets: 287 10*3/uL (ref 150–400)
RBC: 3.54 MIL/uL — ABNORMAL LOW (ref 4.22–5.81)
RDW: 15.3 % (ref 11.5–15.5)
WBC: 10.8 10*3/uL — ABNORMAL HIGH (ref 4.0–10.5)

## 2015-07-31 LAB — GLUCOSE, CAPILLARY: Glucose-Capillary: 96 mg/dL (ref 65–99)

## 2015-07-31 LAB — PH, BODY FLUID: PH, BODY FLUID: 7.5

## 2015-07-31 NOTE — Progress Notes (Signed)
[facility] PROGRESS NOTE  MASOUD [name] DGL:[id number] DOB: 1951/07/25 DOA: 07/29/2015 PCP: Donnie Coffin, MD  Assessment/Plan: Principal Problem:   Hypotension Active Problems:   Essential hypertension   Hemochromatosis   Pleural effusion on right   Lung cancer   Protein-calorie malnutrition, severe   Pleural effusion on left   Metastatic adenocarcinoma of unknown origin   Chronic diastolic heart failure   SOB (shortness of breath)   Generalized weakness   AKI (acute kidney injury)   Constipation   Urinary retention   Back pain   Hypotension: Improving, improving, Is likely due to multiple blood pressure medications. He is taking Lasix 40 mg daily, metoprolol 50 mg daily, spironolactone 25 mg daily, Flomax 0.4 mg daily, Diovan 160 mg daily, verapamil 240 g daily. Patient does not seem to have sepsis clinically. His lactate is 0.64. He has a mild leukocytosis with WBC 11, but no fever and tachycardia.  Continue to hold  Lasix, spironolactone, Diovan, verapamil, metoprolol -give Solu Cortef 50 mg 1 for stress dose on admission  cortisol level- 40.2   Blood pressure still soft after paracentesis today, no evidence of SBP   AKI: Resolved, Likely due to prerenal secondary to hypotension and continuation of ARB, diruetics, NSAIDs. No hydronephrosis by CT abdomen/pelvis  Follow up renal function by BMP - Hold Diovan, ibuprofen, and lasix  Malignant ascites/malignant pleural effusion left thoracentesis performed yielding 820 cc dark bloody fluid, follow-up chest x-ray shows improvement Status post therapeutic paracentesis performed yielding 3.2 liters blood-tinged fluid Anticipate discharge tomorrow blood pressure is stable, PT/OT consultation pending   Essential hypertension: now with hypotension Holding home medications  Shortness of breath and bilateral pleural effusion: Patient's worsening shortness of breath is likely due to worsening L pleural effusion as  evidenced with CT chest. Large left pleural effusion appears partially loculated, but patient does not have fever or CP, clinically not septic. Patient scheduled for thoracentesis by IR  Continue - Duoneb Nebs, Dulera inhaler when necessary albuterol nebulizer,- Mucinex for cough - On prednison 10 mg daily, - give Solu Cortef 50 mg 1 for stress do   Metastasized lung cancer: s/p of chemotherapy. Last dose was 5 weeks ago. Followed up with Dr. Julien Nordmann. Plan to do immunotherapy in the coming Thursday.    Metastatic adenocarcinoma of unknown origin: -Follow-up with Dr. Julien Nordmann  Protein-calorie malnutrition, severe: -Ensure  Chronic diastolic heart failure: 2-D echo on 07/15/15 showed EF of 60-65% with grade 1 diastolic dysfunction. BNP 30.7. Patient has 1+ leg edema. His leg edema is partially from his position since patient has been siting most of the time. CHF is clinically compensated. -Hold Lasix and spironolactone given hypotension -Watched volume status closely  Generalized weakness: Likely due to generalized deconditioning due to multiple comorbidities. -PT/OT  Constipation: -MiraLAX and Colace  Urinary retention: Urinalysis negative. -When necessary in/out catheter -Flomax  Lower back pain: No alarming symptoms, such as leg weakness or tingling sensation. Most likely due to metastasized disease. CT showed unchanged L1 sclerotic vertebral metastasis. -Continue home morphine and oxycodone when necessary -Tylenol    Code Status:      Code Status Orders        Start     Ordered   07/29/15 2214  Do not attempt resuscitation (DNR)   Continuous    Question Answer Comment  In the event of cardiac or respiratory ARREST Do not call a "code blue"   In the event of cardiac or respiratory ARREST  Do not perform Intubation, CPR, defibrillation or ACLS   In the event of cardiac or respiratory ARREST Use medication by any route, position, wound care, and other measures to relive  pain and suffering. May use oxygen, suction and manual treatment of airway obstruction as needed for comfort.      07/29/15 2214     Family Communication: family updated about patient's clinical progress Disposition Plan:   Anticipate discharge tomorrow blood pressure stable overnight   Brief narrative: HPI: Henry [name] is a 64 y.o. male with PMH of hypertension, metastasized Lung cancer(s/p of chemotherapy), thyroid cancer, adenocarcinoma with unknown origin, bilateral pleural effusion, who presents with hypotension, urinary retention, lower back pain, suprapubic abdominal pain, sob  Patient reports that he has urinary retention which has been going on for almost a year. He was recently started with the Flomax by his oncologist, Dr. Julien Nordmann. He has been compliant to this medication, but still has some difficult urinating. He has urgency, but no dysuria or burning on urination. He complains of mild suprapubic abdominal pain. Sister reports patient has been hypotensive the last 2 days, yesterday systolic pressure was 25Z and only earlier today did pressure rise to 56L systolic. Patient also has abdominal distention. Patient reports severe lower low back pain that also started 2 days ago, without leg weakness or tingling. He reports that has SOB which is slightly worse than his baseline. No chest pain. He has mild cough without sputum production. No fever or chills. He reports that he is constipated recently. Patient reports that his is scheduled for thoracentesis tomorrow.  In ED, patient was found to have WBC 11.0, temperature normal, soft blood pressure with SBP 80 to 90 mmHg, AKI, lactate 0.64, BNP 30.7, negative urinalysis. CT-ches and CT-abd/pelvis showed: 1. increased size of large left pleural effusion which appears partially loculated. Near complete collapse of the left lower lobe. 2. Unchanged, partially loculated right pleural effusion. 3. Unchanged right lower lobe consolidation and nodular  densities in the anterior right lung base. 4. Large volume abdominal ascites, increased from prior. 5. Decreased size of polypoid lesions in the ascending colon. 6. Unchanged L1 sclerotic vertebral metastasis. No new bone metastases identified.  Consultants:  None   Procedures:  None   Antibiotics: Anti-infectives    None       HPI/Subjective: Patient hungry and would like to eat , denies any pain,SOB with ambulation  Objective: Filed Vitals:   07/31/15 1053 07/31/15 1104 07/31/15 1109 07/31/15 1121  BP: 107/69 110/67 103/68 99/66  Pulse:      Temp:      TempSrc:      Resp:      Height:      Weight:      SpO2:        Intake/Output Summary (Last 24 hours) at 07/31/15 1157 Last data filed at 07/31/15 8756  Gross per 24 hour  Intake    480 ml  Output    825 ml  Net   -345 ml    Exam:  General: No acute respiratory distress Lungs: Clear to auscultation bilaterally without wheezes or crackles Cardiovascular: Regular rate and rhythm without murmur gallop or rub normal S1 and S2 Abdomen: Nontender, nondistended, soft, bowel sounds positive, no rebound, no ascites, no appreciable mass Extremities: No significant cyanosis, clubbing, or edema bilateral lower extremities     Data Review   Micro Results Recent Results (from the past 240 hour(s))  Culture, body fluid-bottle     Status:  None (Preliminary result)   Collection Time: 07/30/15  3:15 PM  Result Value Ref Range Status   Specimen Description FLUID PLEURAL  Final   Special Requests   Final    BOTTLES DRAWN AEROBIC AND ANAEROBIC 5CC Performed at [facility]    Culture PENDING  Incomplete   Report Status PENDING  Incomplete  Gram stain     Status: None   Collection Time: 07/30/15  3:15 PM  Result Value Ref Range Status   Specimen Description FLUID PLEURAL  Final   Special Requests NONE  Final   Gram Stain   Final    ABUNDANT WBC PRESENT, PREDOMINANTLY MONONUCLEAR NO ORGANISMS SEEN Performed at  [facility]    Report Status 07/30/2015 FINAL  Final    Radiology Reports Dg Chest 1 View  07/30/2015   CLINICAL DATA:  Status post left thoracentesis.  EXAM: CHEST  1 VIEW  COMPARISON:  Chest x-ray on 07/29/2015  FINDINGS: There is decrease in left pleural fluid volume after thoracentesis. Some fluid remains. No pneumothorax. Smaller right pleural effusion is suspected.  IMPRESSION: No pneumothorax after left thoracentesis with reduction in pleural fluid volume. Smaller right pleural effusion is suspected.   Electronically Signed   By: Aletta Edouard M.D.   On: 07/30/2015 15:44   Dg Chest 1 View  07/12/2015   CLINICAL DATA:  Status post left-sided thoracentesis.  EXAM: CHEST  1 VIEW  COMPARISON:  June 28, 2014.  FINDINGS: Stable cardiomediastinal silhouette. No pneumothorax is noted. Minimal right pleural effusion is noted which is increased compared to prior exam. Right basilar opacity is noted which is stable and consistent with scarring or atelectasis. Mild left pleural effusion is noted in left lung base. Increased left upper lobe opacity is noted concerning for possible pneumonia or atelectasis. Mild central pulmonary vascular congestion is noted.  IMPRESSION: Mild central pulmonary vascular congestion. Minimal right pleural effusion is noted. Increased left upper lobe opacity is noted concerning for pneumonia or possibly atelectasis. Mild left pleural effusion is noted in left lung base status post thoracentesis. No definite pneumothorax is noted.   Electronically Signed   By: Marijo Conception, M.D.   On: 07/12/2015 10:59   Dg Chest 2 View  07/29/2015   CLINICAL DATA:  Shortness of breath. Abdominal distention. History of lung cancer. Recent chemotherapy.  EXAM: CHEST  2 VIEW  COMPARISON:  07/26/2015  FINDINGS: Cardiomediastinal silhouette is grossly unchanged, with the cardiac silhouette being partially obscured. Large left pleural effusion is unchanged with associated compressive left  upper and lower lobe atelectasis. Small right pleural effusion also does not appear significantly changed. Patchy and nodular parenchymal opacities in the right lung base are unchanged. No definite new areas of airspace consolidation are seen. There is no pneumothorax. No acute osseous abnormality is seen.  IMPRESSION: Unchanged examination with large left and small right pleural effusions, associated compressive atelectasis, and chronic right basilar opacities as above.   Electronically Signed   By: Logan Bores M.D.   On: 07/29/2015 17:37   Dg Chest 2 View  07/26/2015   CLINICAL DATA:  Metastatic adenocarcinoma. Shortness of breath. Right lung cancer.  EXAM: CHEST  2 VIEW  COMPARISON:  Chest x-rays dated 07/20/2015, 07/14/2015 and 07/12/2015 and CT scan dated 07/10/2015  FINDINGS: There has been slight increase in the large left pleural effusion with further compressive atelectasis of the upper and lower lobes. Ill-defined nodular densities and chronic pleural effusion at the right base and in the  right midzone appear unchanged. Pulmonary vascularity is normal.  No acute osseous abnormality.  IMPRESSION: Increased large left pleural effusion with further compression of the left upper and lower lobes. No change on the right.   Electronically Signed   By: Lorriane Shire M.D.   On: 07/26/2015 12:48   Dg Chest 2 View  07/20/2015   CLINICAL DATA:  Follow-up left pleural effusion, persistent shortness of breath, cough, and congestion. History of lung malignancy, COPD, and previous heavy tobacco use.  EXAM: CHEST  2 VIEW  COMPARISON:  PA and lateral chest x-ray of July 14, 2015  FINDINGS: There is a persistent moderate size left pleural effusion with small right pleural effusion. The pulmonary interstitial markings are slightly less conspicuous today. The cardiac silhouette remains enlarged. The pulmonary vascularity is less engorged. There is stable tortuosity of the descending thoracic aorta. The bony thorax  exhibits no acute abnormality.  IMPRESSION: Improvement in CHF with less interstitial edema. Stable moderate size left pleural effusion and small right pleural effusion.   Electronically Signed   By: David  Martinique M.D.   On: 07/20/2015 13:41   Dg Chest 2 View  07/14/2015   CLINICAL DATA:  Shortness of breath today, recent thoracentesis 2 days ago, still short of breath and not feeling much better, history hypertension, smoking, lung cancer, papillary thyroid cancer, chronic pancreatitis, irritable bowel syndrome  EXAM: CHEST  2 VIEW  COMPARISON:  07/12/2015  FINDINGS: Enlargement of cardiac silhouette with pulmonary vascular congestion.  Bibasilar effusions and atelectasis greater on LEFT increased since previous exam.  Minimal perihilar infiltrates bilaterally likely mild pulmonary edema.  No pneumothorax.  Bones demineralized.  IMPRESSION: Persistent mild CHF with bibasilar effusions atelectasis, increased on LEFT since prior study.   Electronically Signed   By: Lavonia Dana M.D.   On: 07/14/2015 12:57   Dg Chest 2 View  07/11/2015   CLINICAL DATA:  Lung cancer on chemotherapy, increased shortness of breath for 1 week, fluid on lungs, productive cough for 3 months, hypertension  EXAM: CHEST  2 VIEW  COMPARISON:  CT chest 07/10/2015  FINDINGS: Enlargement of cardiac silhouette.  Tortuous aorta.  Mediastinal contours and pulmonary vascularity normal.  Bibasilar effusions LEFT greater than RIGHT similar to prior CT.  Bibasilar pulmonary opacities may represent infiltrate or atelectasis, little changed from prior CT.  Upper lungs grossly clear.  No pneumothorax.  Bones demineralized.  IMPRESSION: Bibasilar pleural effusions LEFT greater than RIGHT with bibasilar pulmonary opacities that may represent atelectasis or consolidation.  Little interval change.   Electronically Signed   By: Lavonia Dana M.D.   On: 07/11/2015 18:57   Dg Abd 1 View  07/29/2015   CLINICAL DATA:  Shortness of breath and abdominal  distension. History of lung cancer with recent chemotherapy.  EXAM: ABDOMEN - 1 VIEW  COMPARISON:  CT abdomen and pelvis 07/10/2015  FINDINGS: There is a small to moderate amount of stool and gas in nondilated colon. There is a paucity of small bowel gas, which limits evaluation for obstruction. Increased density in the abdomen may partially reflect ascites, as was present on the prior CT. The hepatic shadow is incompletely visualized but appears mildly enlarged. The uppermost portion of the abdomen was incompletely imaged. Mild scoliosis and moderate disc degeneration is noted in the lumbar spine.  IMPRESSION: 1. Increased density in the abdomen which may reflect ascites. 2. Small to moderate amount of stool in the colon. A paucity of small bowel gas limits evaluation for small bowel  obstruction.   Electronically Signed   By: Logan Bores M.D.   On: 07/29/2015 17:42   Ct Chest W Contrast  07/29/2015   CLINICAL DATA:  Hypotension.  Lung cancer.  EXAM: CT CHEST, ABDOMEN, AND PELVIS WITH CONTRAST  TECHNIQUE: Multidetector CT imaging of the chest, abdomen and pelvis was performed following the standard protocol during bolus administration of intravenous contrast.  CONTRAST:  164m OMNIPAQUE IOHEXOL 300 MG/ML  SOLN  COMPARISON:  CT chest, abdomen, and pelvis 07/10/2015.  FINDINGS: CT CHEST FINDINGS  Small right axillary and subpectoral lymph nodes measure up to 7 mm in short axis, unchanged. No enlarged supraclavicular lymph nodes are identified. Small, scattered mediastinal lymph nodes measure up to 7 mm in short axis and are stable to minimally smaller than on the prior study. The heart is normal in size and is shifted rightward. Centrilobular emphysema is again noted.  Small right pleural effusion has not significantly changed in size, with loculated components again seen anteriorly and in a subpulmonic location. Increased right-sided pleural density is consistent with prior talc pleurodesis. Consolidative and  ground-glass opacity in the right lower lobe has not significantly changed. Nodular densities in the anterior aspect of the basilar right lower lobe and in the right middle lobe are unchanged, measuring up to 2.1 cm in size (series 7, image 40). Diffuse, mild thickening along the right major and minor fissures with areas of mild nodularity is unchanged.  Large left pleural effusion has mildly increased in size overall with some loculation anteriorly. Small focus of likely loculated pleural fluid posteriorly towards the apex is slightly smaller. There is increased compressive atelectasis on the left lower lobe and lingula, now with near complete left lower lobe collapse. No definite enhancing pleural nodules are identified. 3 mm left upper lobe nodule is unchanged (series 7, image 26). Small focus of chronic scarring in the left upper lobe is unchanged (series 7, image 12). No suspicious lytic or blastic osseous lesions are identified in the chest.  CT ABDOMEN AND PELVIS FINDINGS  The liver is enlarged, measuring approximately 20 cm in craniocaudal length. Small low-density liver lesions are unchanged and compatible with cysts, measuring up to 1.4 cm in size. Gallbladder is grossly unremarkable. Spleen and kidneys are unremarkable. Mild thickening of both adrenal glands is again noted. 13 mm mildly complex/septated cystic lesion in the pancreatic head is unchanged and may represent an IPMN.  Oral contrast in the distal esophagus may reflect dysmotility or reflux. Oral contrast is present in multiple nondilated loops of small bowel without evidence of obstruction. Three polypoid soft tissue lesions in the ascending colon are less prominent in size than on the most recent prior study and similar in size to the 05/01/2015 examination, measuring up to approximately 1.7 cm (series 2 images 80-85). There is a moderate amount of stool in the colon.  Bladder is unremarkable. Prostate is enlarged. There is mild-to-moderate  atherosclerotic calcification of the abdominal aorta and its major branch vessels. 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 The SMV and portal vein appear patent. No enlarged lymph nodes are identified. There is large volume ascites, increased from the prior abdominal CT. Diffuse body wall edema is present. 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 No new suspicious osseous lesions are identified. Advanced disc degeneration is present at L4-5.  IMPRESSION: 1. Increased size of large left pleural effusion which appears partially loculated. Near complete collapse of the left lower lobe.  2. Unchanged, partially loculated right pleural effusion. 3. Unchanged right lower lobe consolidation and nodular densities in the anterior right lung base. 4. Large volume abdominal ascites, increased from prior. 5. Decreased size of polypoid lesions in the ascending colon. 6. Unchanged L1 sclerotic vertebral metastasis. No new bone metastases identified.   Electronically Signed   By: Logan Bores M.D.   On: 07/29/2015 19:30   Ct Chest W Contrast  07/10/2015   CLINICAL DATA:  Restaging lung cancer. Initial diagnosis 2015. History of right lobectomy.  EXAM: CT CHEST, ABDOMEN, AND PELVIS WITH CONTRAST  TECHNIQUE: Multidetector CT imaging of the chest, abdomen and pelvis was performed following the standard protocol during bolus administration of intravenous contrast.  CONTRAST:  18m OMNIPAQUE IOHEXOL 300 MG/ML  SOLN  COMPARISON:  CT 05/01/2015 and PET-CT 06/30/2014  FINDINGS: CT CHEST FINDINGS  Chest wall: Stable small axillary lymph nodes but no mass or overt adenopathy. The thyroid gland appears Stable. Stable small nodules. No supraclavicular adenopathy. The bony thorax is intact. No obvious metastatic lesions involving the thoracic spine or sternum. No obvious rib lesions. Progressive metastatic disease involving the L1 vertebral body.  Mediastinum:  The heart is mildly enlarged but stable. Stable tortuosity, ectasia and calcification of the thoracic aorta. Stable small scattered mediastinal lymph nodes. The esophagus is grossly normal.  Lungs/ pleura: Persistent loculated pleural fluid collection on the right side with density along the pleural surface likely due to previous talc pleurodesis. Persistent overlying long atelectasis and right basilar airspace consolidation. Stable bilobed masslike densities in the right middle lobe and right lower lobe on image number 42.  There is a new large left pleural effusion with areas of probable loculation. I do not see any obvious enhancing pleural nodules. A left-sided thoracentesis may be useful for therapeutic and diagnostic purposes and to exclude malignant left effusion.  CT ABDOMEN AND PELVIS FINDINGS  Hepatobiliary: Stable scattered hepatic cysts. No findings suspicious for hepatic metastatic disease. The portal vein is patent.  Pancreas: Stable small cystic lesion in the pancreatic head. No inflammation or ductal dilatation.  Spleen: Normal size.  No focal lesions.  Adrenals/Urinary Tract: Bilateral nodular enlargement of both adrenal glands which could reflect nodular hyperplasia. No obvious mass/metastasis. The kidneys are unremarkable and stable.  Stomach/Bowel: The stomach wall is thickened but this is likely due to under distension. No mass or obstruction. The small bowel is grossly normal. No inflammation or obstruction. Enlarging Masses involving the ascending colon. These could be adenomas or possible metastasis.  Vascular/Lymphatic: Stable tortuosity, ectasia and calcification of the abdominal aorta. The branch vessels are patent. 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 The SMA may also be partially occluded/thrombosed. No mesenteric or retroperitoneal mass or adenopathy. Small scattered lymph nodes are noted.  Other: Progression of abdominal/ pelvic  ascites without obvious omental or peritoneal surface implants. The bladder appears normal. No pelvic mass or adenopathy. No inguinal mass or adenopathy.  Musculoskeletal: No significant bony findings in the pelvis. Progressive L1 sclerotic metastatic disease.  IMPRESSION: 1. Persistent right-sided loculated pleural fluid collection and evidence of prior talc pleurodesis. No obvious enhancing pleural masses. Persistent right basilar airspace consolidation and stable bilobed masslike densities in the right middle lobe and right lower lobe. 2. New large left pleural effusion with areas of probable loculation. No obvious enhancing pleural nodules but there left-sided thoracentesis may be useful for diagnostic and therapeutic purposes. 3. Progressive abdominal/pelvic ascites. Suspect chronic splenic vein inclusion with prominent perisplenic  and perigastric collateral vessels. 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 4. Stable nodular enlargement of both adrenal glands. 5. Enlarging soft tissue masses involving the ascending colon. These could be adenomas or metastasis. 6. Progressive sclerotic metastatic disease involving the L1 vertebral body. No definite new bone metastasis.   Electronically Signed   By: Marijo Sanes M.D.   On: 07/10/2015 14:28   Ct Abdomen Pelvis W Contrast  07/29/2015   CLINICAL DATA:  Hypotension.  Lung cancer.  EXAM: CT CHEST, ABDOMEN, AND PELVIS WITH CONTRAST  TECHNIQUE: Multidetector CT imaging of the chest, abdomen and pelvis was performed following the standard protocol during bolus administration of intravenous contrast.  CONTRAST:  146m OMNIPAQUE IOHEXOL 300 MG/ML  SOLN  COMPARISON:  CT chest, abdomen, and pelvis 07/10/2015.  FINDINGS: CT CHEST FINDINGS  Small right axillary and subpectoral lymph nodes measure up to 7 mm in short axis, unchanged. No enlarged supraclavicular lymph nodes are identified. Small, scattered mediastinal lymph nodes measure up to 7 mm in short axis and are stable  to minimally smaller than on the prior study. The heart is normal in size and is shifted rightward. Centrilobular emphysema is again noted.  Small right pleural effusion has not significantly changed in size, with loculated components again seen anteriorly and in a subpulmonic location. Increased right-sided pleural density is consistent with prior talc pleurodesis. Consolidative and ground-glass opacity in the right lower lobe has not significantly changed. Nodular densities in the anterior aspect of the basilar right lower lobe and in the right middle lobe are unchanged, measuring up to 2.1 cm in size (series 7, image 40). Diffuse, mild thickening along the right major and minor fissures with areas of mild nodularity is unchanged.  Large left pleural effusion has mildly increased in size overall with some loculation anteriorly. Small focus of likely loculated pleural fluid posteriorly towards the apex is slightly smaller. There is increased compressive atelectasis on the left lower lobe and lingula, now with near complete left lower lobe collapse. No definite enhancing pleural nodules are identified. 3 mm left upper lobe nodule is unchanged (series 7, image 26). Small focus of chronic scarring in the left upper lobe is unchanged (series 7, image 12). No suspicious lytic or blastic osseous lesions are identified in the chest.  CT ABDOMEN AND PELVIS FINDINGS  The liver is enlarged, measuring approximately 20 cm in craniocaudal length. Small low-density liver lesions are unchanged and compatible with cysts, measuring up to 1.4 cm in size. Gallbladder is grossly unremarkable. Spleen and kidneys are unremarkable. Mild thickening of both adrenal glands is again noted. 13 mm mildly complex/septated cystic lesion in the pancreatic head is unchanged and may represent an IPMN.  Oral contrast in the distal esophagus may reflect dysmotility or reflux. Oral contrast is present in multiple nondilated loops of small bowel  without evidence of obstruction. Three polypoid soft tissue lesions in the ascending colon are less prominent in size than on the most recent prior study and similar in size to the 05/01/2015 examination, measuring up to approximately 1.7 cm (series 2 images 80-85). There is a moderate amount of stool in the colon.  Bladder is unremarkable. Prostate is enlarged. There is mild-to-moderate atherosclerotic calcification of the abdominal aorta and its major branch vessels. 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 The SMV and portal vein appear patent. No enlarged lymph nodes are identified. There is large volume ascites, increased from the prior abdominal CT. Diffuse  body wall edema is present. 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 No new suspicious osseous lesions are identified. Advanced disc degeneration is present at L4-5.  IMPRESSION: 1. Increased size of large left pleural effusion which appears partially loculated. Near complete collapse of the left lower lobe. 2. Unchanged, partially loculated right pleural effusion. 3. Unchanged right lower lobe consolidation and nodular densities in the anterior right lung base. 4. Large volume abdominal ascites, increased from prior. 5. Decreased size of polypoid lesions in the ascending colon. 6. Unchanged L1 sclerotic vertebral metastasis. No new bone metastases identified.   Electronically Signed   By: Logan Bores M.D.   On: 07/29/2015 19:30   Ct Abdomen Pelvis W Contrast  07/10/2015   CLINICAL DATA:  Restaging lung cancer. Initial diagnosis 2015. History of right lobectomy.  EXAM: CT CHEST, ABDOMEN, AND PELVIS WITH CONTRAST  TECHNIQUE: Multidetector CT imaging of the chest, abdomen and pelvis was performed following the standard protocol during bolus administration of intravenous contrast.  CONTRAST:  133m OMNIPAQUE IOHEXOL 300 MG/ML  SOLN  COMPARISON:  CT 05/01/2015 and PET-CT  06/30/2014  FINDINGS: CT CHEST FINDINGS  Chest wall: Stable small axillary lymph nodes but no mass or overt adenopathy. The thyroid gland appears Stable. Stable small nodules. No supraclavicular adenopathy. The bony thorax is intact. No obvious metastatic lesions involving the thoracic spine or sternum. No obvious rib lesions. Progressive metastatic disease involving the L1 vertebral body.  Mediastinum: The heart is mildly enlarged but stable. Stable tortuosity, ectasia and calcification of the thoracic aorta. Stable small scattered mediastinal lymph nodes. The esophagus is grossly normal.  Lungs/ pleura: Persistent loculated pleural fluid collection on the right side with density along the pleural surface likely due to previous talc pleurodesis. Persistent overlying long atelectasis and right basilar airspace consolidation. Stable bilobed masslike densities in the right middle lobe and right lower lobe on image number 42.  There is a new large left pleural effusion with areas of probable loculation. I do not see any obvious enhancing pleural nodules. A left-sided thoracentesis may be useful for therapeutic and diagnostic purposes and to exclude malignant left effusion.  CT ABDOMEN AND PELVIS FINDINGS  Hepatobiliary: Stable scattered hepatic cysts. No findings suspicious for hepatic metastatic disease. The portal vein is patent.  Pancreas: Stable small cystic lesion in the pancreatic head. No inflammation or ductal dilatation.  Spleen: Normal size.  No focal lesions.  Adrenals/Urinary Tract: Bilateral nodular enlargement of both adrenal glands which could reflect nodular hyperplasia. No obvious mass/metastasis. The kidneys are unremarkable and stable.  Stomach/Bowel: The stomach wall is thickened but this is likely due to under distension. No mass or obstruction. The small bowel is grossly normal. No inflammation or obstruction. Enlarging Masses involving the ascending colon. These could be adenomas or possible  metastasis.  Vascular/Lymphatic: Stable tortuosity, ectasia and calcification of the abdominal aorta. The branch vessels are patent. 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 The SMA may also be partially occluded/thrombosed. No mesenteric or retroperitoneal mass or adenopathy. Small scattered lymph nodes are noted.  Other: Progression of abdominal/ pelvic ascites without obvious omental or peritoneal surface implants. The bladder appears normal. No pelvic mass or adenopathy. No inguinal mass or adenopathy.  Musculoskeletal: No significant bony findings in the pelvis. Progressive L1 sclerotic metastatic disease.  IMPRESSION: 1. Persistent right-sided loculated pleural fluid collection and evidence of prior talc pleurodesis. No obvious enhancing pleural masses. Persistent right basilar airspace consolidation and stable bilobed masslike densities in  the right middle lobe and right lower lobe. 2. New large left pleural effusion with areas of probable loculation. No obvious enhancing pleural nodules but there left-sided thoracentesis may be useful for diagnostic and therapeutic purposes. 3. Progressive abdominal/pelvic ascites. 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 4. Stable nodular enlargement of both adrenal glands. 5. Enlarging soft tissue masses involving the ascending colon. These could be adenomas or metastasis. 6. Progressive sclerotic metastatic disease involving the L1 vertebral body. No definite new bone metastasis.   Electronically Signed   By: Marijo Sanes M.D.   On: 07/10/2015 14:28   US Paracentesis  07/13/2015   INDICATION: Ascites, request for diagnostic and therapeutic paracentesis.  EXAM: ULTRASOUND-GUIDED PARACENTESIS  COMPARISON:  None.  MEDICATIONS: None.  COMPLICATIONS: None immediate  TECHNIQUE: Informed written consent was obtained from the  patient after a discussion of the risks, benefits and alternatives to treatment. A timeout was performed prior to the initiation of the procedure.  Initial ultrasound scanning demonstrates a small amount of ascites within the right upper abdominal quadrant. The right upper abdomen was prepped and draped in the usual sterile fashion. 1% lidocaine was used for local anesthesia.  Under direct ultrasound guidance, a 19 gauge, 7-cm, Yueh catheter was introduced. An ultrasound image was saved for documentation purposed. The paracentesis was performed. The catheter was removed and a dressing was applied. The patient tolerated the procedure well without immediate post procedural complication.  FINDINGS: A total of approximately 600 ml of serous fluid was removed. Samples were sent to the laboratory as requested by the clinical team.  IMPRESSION: Successful ultrasound-guided paracentesis yielding 600 ml of peritoneal fluid.  Read By:  Tsosie Billing PA-C   Electronically Signed   By: Jerilynn Mages.  Shick M.D.   On: 07/13/2015 14:39   US Thoracentesis Asp Pleural Space W/img Guide  07/31/2015   INDICATION: Patient with history of lung and thyroid cancers, dyspnea, recurrent left pleural effusion. Request is made for diagnostic and therapeutic left thoracentesis .  EXAM: ULTRASOUND GUIDED DIAGNOSTIC AND THERAPEUTIC LEFT THORACENTESIS  COMPARISON:  Prior thoracentesis on 07/12/2015  MEDICATIONS: None  COMPLICATIONS: None immediate  TECHNIQUE: Informed written consent was obtained from the patient after a discussion of the risks, benefits and alternatives to treatment. A timeout was performed prior to the initiation of the procedure.  Initial ultrasound scanning demonstrates a moderate to large left pleural effusion. The lower chest was prepped and draped in the usual sterile fashion. 1% lidocaine was used for local anesthesia.  An ultrasound image was saved for documentation purposes. A 6 Fr Safe-T-Centesis catheter was introduced. The  thoracentesis was performed. The catheter was removed and a dressing was applied. The patient tolerated the procedure well without immediate post procedural complication. The patient was escorted to have an upright chest radiograph.  FINDINGS: A total of approximately 820 cc of dark bloody fluid was removed. Requested samples were sent to the laboratory.  IMPRESSION: Successful ultrasound-guided diagnostic and therapeutic left sided thoracentesis yielding 820 cc of pleural fluid.  Read by: Rowe Robert, PA-C   Electronically Signed   By: Jerilynn Mages.  Shick M.D.   On: 07/31/2015 08:40   US Thoracentesis Asp Pleural Space W/img Guide  07/12/2015   INDICATION: Patient with history of lung and thyroid cancers, dyspnea, left pleural effusion. Request is made for diagnostic and therapeutic left thoracentesis.  EXAM: ULTRASOUND GUIDED DIAGNOSTIC AND THERAPEUTIC LEFT THORACENTESIS  COMPARISON:  None.  MEDICATIONS: None  COMPLICATIONS: None immediate  TECHNIQUE: Informed written consent was obtained from the patient after a discussion of the risks, benefits and alternatives to treatment. A timeout was performed prior to the initiation of the procedure.  Initial ultrasound scanning demonstrates a moderate to large left pleural effusion. The lower chest was prepped and draped in the usual sterile fashion. 1% lidocaine was used for local anesthesia.  An ultrasound image was saved for documentation purposes. A 6 Fr Safe-T-Centesis catheter was introduced. The thoracentesis was performed. The catheter was removed and a dressing was applied. The patient tolerated the procedure well without immediate post procedural complication. The patient was escorted to have an upright chest radiograph.  FINDINGS: A total of approximately 1 liter of turbid, dark bloody fluid was removed. Requested samples were sent to the laboratory.  IMPRESSION: Successful ultrasound-guided diagnostic/therapeutic left sided thoracentesis yielding 1 liter of pleural  fluid.  Read by: Rowe Robert, PA-C   Electronically Signed   By: Aletta Edouard M.D.   On: 07/12/2015 11:05     CBC  Recent Labs Lab 07/26/15 1009 07/29/15 1652 07/30/15 0614 07/31/15 0615  WBC 10.4* 11.0* 10.7* 10.8*  HGB 13.0 12.7* 12.4* 12.2*  HCT 39.1 38.4* 38.0* 36.8*  PLT 323 359 312 287  MCV 103.7* 104.9* 105.0* 104.0*  MCH 34.5* 34.7* 34.3* 34.5*  MCHC 33.2 33.1 32.6 33.2  RDW 15.2* 15.5 15.4 15.3  LYMPHSABS 1.4 1.2  --   --   MONOABS 0.9 1.1*  --   --   EOSABS 0.1 0.1  --   --   BASOSABS 0.0 0.0  --   --     Chemistries   Recent Labs Lab 07/26/15 1009 07/29/15 1652 07/29/15 1711 07/30/15 0614 07/31/15 0615  NA 140 140  --  140 140  K 3.9 3.8  --  4.0 3.7  CL  --  96*  --  97* 100*  CO2 37* 37*  --  35* 36*  GLUCOSE 163* 119*  --  134* 109*  BUN 26.0 30*  --  25* 21*  CREATININE 1.0 1.10 1.30* 0.98 0.84  CALCIUM 9.4 9.2  --  9.2 9.1  AST 23 23  --   --  19  ALT 19 24  --   --  18  ALKPHOS 70 62  --   --  60  BILITOT 0.74 0.6  --   --  0.5   ------------------------------------------------------------------------------------------------------------------ estimated creatinine clearance is 84.4 mL/min (by C-G formula based on Cr of 0.84). ------------------------------------------------------------------------------------------------------------------ No results for input(s): HGBA1C in the last 72 hours. ------------------------------------------------------------------------------------------------------------------ No results for input(s): CHOL, HDL, LDLCALC, TRIG, CHOLHDL, LDLDIRECT in the last 72 hours. ------------------------------------------------------------------------------------------------------------------ No results for input(s): TSH, T4TOTAL, T3FREE, THYROIDAB in the last 72 hours.  Invalid input(s): FREET3 ------------------------------------------------------------------------------------------------------------------ No results for  input(s): VITAMINB12, FOLATE, FERRITIN, TIBC, IRON, RETICCTPCT in the last 72 hours.  Coagulation profile  Recent Labs Lab 07/29/15 2340  INR 1.01    No results for input(s): DDIMER in the last 72 hours.  Cardiac Enzymes No results for input(s): CKMB, TROPONINI, MYOGLOBIN in the last 168 hours.  Invalid input(s): CK ------------------------------------------------------------------------------------------------------------------ Invalid input(s): POCBNP   CBG:  Recent Labs Lab 07/30/15 0754 07/31/15 0903  GLUCAP 121* 96       Studies: Dg Chest 1 View  07/30/2015   CLINICAL DATA:  Status post left thoracentesis.  EXAM: CHEST  1 VIEW  COMPARISON:  Chest x-ray on 07/29/2015  FINDINGS: There is decrease in left pleural  fluid volume after thoracentesis. Some fluid remains. No pneumothorax. Smaller right pleural effusion is suspected.  IMPRESSION: No pneumothorax after left thoracentesis with reduction in pleural fluid volume. Smaller right pleural effusion is suspected.   Electronically Signed   By: Aletta Edouard M.D.   On: 07/30/2015 15:44   Dg Chest 2 View  07/29/2015   CLINICAL DATA:  Shortness of breath. Abdominal distention. History of lung cancer. Recent chemotherapy.  EXAM: CHEST  2 VIEW  COMPARISON:  07/26/2015  FINDINGS: Cardiomediastinal silhouette is grossly unchanged, with the cardiac silhouette being partially obscured. Large left pleural effusion is unchanged with associated compressive left upper and lower lobe atelectasis. Small right pleural effusion also does not appear significantly changed. Patchy and nodular parenchymal opacities in the right lung base are unchanged. No definite new areas of airspace consolidation are seen. There is no pneumothorax. No acute osseous abnormality is seen.  IMPRESSION: Unchanged examination with large left and small right pleural effusions, associated compressive atelectasis, and chronic right basilar opacities as above.    Electronically Signed   By: Logan Bores M.D.   On: 07/29/2015 17:37   Dg Abd 1 View  07/29/2015   CLINICAL DATA:  Shortness of breath and abdominal distension. History of lung cancer with recent chemotherapy.  EXAM: ABDOMEN - 1 VIEW  COMPARISON:  CT abdomen and pelvis 07/10/2015  FINDINGS: There is a small to moderate amount of stool and gas in nondilated colon. There is a paucity of small bowel gas, which limits evaluation for obstruction. Increased density in the abdomen may partially reflect ascites, as was present on the prior CT. The hepatic shadow is incompletely visualized but appears mildly enlarged. The uppermost portion of the abdomen was incompletely imaged. Mild scoliosis and moderate disc degeneration is noted in the lumbar spine.  IMPRESSION: 1. Increased density in the abdomen which may reflect ascites. 2. Small to moderate amount of stool in the colon. A paucity of small bowel gas limits evaluation for small bowel obstruction.   Electronically Signed   By: Logan Bores M.D.   On: 07/29/2015 17:42   Ct Chest W Contrast  07/29/2015   CLINICAL DATA:  Hypotension.  Lung cancer.  EXAM: CT CHEST, ABDOMEN, AND PELVIS WITH CONTRAST  TECHNIQUE: Multidetector CT imaging of the chest, abdomen and pelvis was performed following the standard protocol during bolus administration of intravenous contrast.  CONTRAST:  113m OMNIPAQUE IOHEXOL 300 MG/ML  SOLN  COMPARISON:  CT chest, abdomen, and pelvis 07/10/2015.  FINDINGS: CT CHEST FINDINGS  Small right axillary and subpectoral lymph nodes measure up to 7 mm in short axis, unchanged. No enlarged supraclavicular lymph nodes are identified. Small, scattered mediastinal lymph nodes measure up to 7 mm in short axis and are stable to minimally smaller than on the prior study. The heart is normal in size and is shifted rightward. Centrilobular emphysema is again noted.  Small right pleural effusion has not significantly changed in size, with loculated components  again seen anteriorly and in a subpulmonic location. Increased right-sided pleural density is consistent with prior talc pleurodesis. Consolidative and ground-glass opacity in the right lower lobe has not significantly changed. Nodular densities in the anterior aspect of the basilar right lower lobe and in the right middle lobe are unchanged, measuring up to 2.1 cm in size (series 7, image 40). Diffuse, mild thickening along the right major and minor fissures with areas of mild nodularity is unchanged.  Large left pleural effusion has mildly increased in size overall  with some loculation anteriorly. Small focus of likely loculated pleural fluid posteriorly towards the apex is slightly smaller. There is increased compressive atelectasis on the left lower lobe and lingula, now with near complete left lower lobe collapse. No definite enhancing pleural nodules are identified. 3 mm left upper lobe nodule is unchanged (series 7, image 26). Small focus of chronic scarring in the left upper lobe is unchanged (series 7, image 12). No suspicious lytic or blastic osseous lesions are identified in the chest.  CT ABDOMEN AND PELVIS FINDINGS  The liver is enlarged, measuring approximately 20 cm in craniocaudal length. Small low-density liver lesions are unchanged and compatible with cysts, measuring up to 1.4 cm in size. Gallbladder is grossly unremarkable. Spleen and kidneys are unremarkable. Mild thickening of both adrenal glands is again noted. 13 mm mildly complex/septated cystic lesion in the pancreatic head is unchanged and may represent an IPMN.  Oral contrast in the distal esophagus may reflect dysmotility or reflux. Oral contrast is present in multiple nondilated loops of small bowel without evidence of obstruction. Three polypoid soft tissue lesions in the ascending colon are less prominent in size than on the most recent prior study and similar in size to the 05/01/2015 examination, measuring up to approximately 1.7 cm  (series 2 images 80-85). There is a moderate amount of stool in the colon.  Bladder is unremarkable. Prostate is enlarged. There is mild-to-moderate atherosclerotic calcification of the abdominal aorta and its major branch vessels. 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 The SMV and portal vein appear patent. No enlarged lymph nodes are identified. There is large volume ascites, increased from the prior abdominal CT. Diffuse body wall edema is present. 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 No new suspicious osseous lesions are identified. Advanced disc degeneration is present at L4-5.  IMPRESSION: 1. Increased size of large left pleural effusion which appears partially loculated. Near complete collapse of the left lower lobe. 2. Unchanged, partially loculated right pleural effusion. 3. Unchanged right lower lobe consolidation and nodular densities in the anterior right lung base. 4. Large volume abdominal ascites, increased from prior. 5. Decreased size of polypoid lesions in the ascending colon. 6. Unchanged L1 sclerotic vertebral metastasis. No new bone metastases identified.   Electronically Signed   By: Logan Bores M.D.   On: 07/29/2015 19:30   Ct Abdomen Pelvis W Contrast  07/29/2015   CLINICAL DATA:  Hypotension.  Lung cancer.  EXAM: CT CHEST, ABDOMEN, AND PELVIS WITH CONTRAST  TECHNIQUE: Multidetector CT imaging of the chest, abdomen and pelvis was performed following the standard protocol during bolus administration of intravenous contrast.  CONTRAST:  184m OMNIPAQUE IOHEXOL 300 MG/ML  SOLN  COMPARISON:  CT chest, abdomen, and pelvis 07/10/2015.  FINDINGS: CT CHEST FINDINGS  Small right axillary and subpectoral lymph nodes measure up to 7 mm in short axis, unchanged. No enlarged supraclavicular lymph nodes are identified. Small, scattered mediastinal lymph nodes measure up to 7 mm in short axis and are stable to minimally  smaller than on the prior study. The heart is normal in size and is shifted rightward. Centrilobular emphysema is again noted.  Small right pleural effusion has not significantly changed in size, with loculated components again seen anteriorly and in a subpulmonic location. Increased right-sided pleural density is consistent with prior talc pleurodesis. Consolidative and ground-glass opacity in the right lower lobe has not significantly changed. Nodular densities in the anterior aspect of the  basilar right lower lobe and in the right middle lobe are unchanged, measuring up to 2.1 cm in size (series 7, image 40). Diffuse, mild thickening along the right major and minor fissures with areas of mild nodularity is unchanged.  Large left pleural effusion has mildly increased in size overall with some loculation anteriorly. Small focus of likely loculated pleural fluid posteriorly towards the apex is slightly smaller. There is increased compressive atelectasis on the left lower lobe and lingula, now with near complete left lower lobe collapse. No definite enhancing pleural nodules are identified. 3 mm left upper lobe nodule is unchanged (series 7, image 26). Small focus of chronic scarring in the left upper lobe is unchanged (series 7, image 12). No suspicious lytic or blastic osseous lesions are identified in the chest.  CT ABDOMEN AND PELVIS FINDINGS  The liver is enlarged, measuring approximately 20 cm in craniocaudal length. Small low-density liver lesions are unchanged and compatible with cysts, measuring up to 1.4 cm in size. Gallbladder is grossly unremarkable. Spleen and kidneys are unremarkable. Mild thickening of both adrenal glands is again noted. 13 mm mildly complex/septated cystic lesion in the pancreatic head is unchanged and may represent an IPMN.  Oral contrast in the distal esophagus may reflect dysmotility or reflux. Oral contrast is present in multiple nondilated loops of small bowel without evidence of  obstruction. Three polypoid soft tissue lesions in the ascending colon are less prominent in size than on the most recent prior study and similar in size to the 05/01/2015 examination, measuring up to approximately 1.7 cm (series 2 images 80-85). There is a moderate amount of stool in the colon.  Bladder is unremarkable. Prostate is enlarged. There is mild-to-moderate atherosclerotic calcification of the abdominal aorta and its major branch vessels. 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 The SMV and portal vein appear patent. No enlarged lymph nodes are identified. There is large volume ascites, increased from the prior abdominal CT. Diffuse body wall edema is present. 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 No new suspicious osseous lesions are identified. Advanced disc degeneration is present at L4-5.  IMPRESSION: 1. Increased size of large left pleural effusion which appears partially loculated. Near complete collapse of the left lower lobe. 2. Unchanged, partially loculated right pleural effusion. 3. Unchanged right lower lobe consolidation and nodular densities in the anterior right lung base. 4. Large volume abdominal ascites, increased from prior. 5. Decreased size of polypoid lesions in the ascending colon. 6. Unchanged L1 sclerotic vertebral metastasis. No new bone metastases identified.   Electronically Signed   By: Logan Bores M.D.   On: 07/29/2015 19:30   US Thoracentesis Asp Pleural Space W/img Guide  07/31/2015   INDICATION: Patient with history of lung and thyroid cancers, dyspnea, recurrent left pleural effusion. Request is made for diagnostic and therapeutic left thoracentesis .  EXAM: ULTRASOUND GUIDED DIAGNOSTIC AND THERAPEUTIC LEFT THORACENTESIS  COMPARISON:  Prior thoracentesis on 07/12/2015  MEDICATIONS: None  COMPLICATIONS: None immediate  TECHNIQUE: Informed written consent was obtained from the patient  after a discussion of the risks, benefits and alternatives to treatment. A timeout was performed prior to the initiation of the procedure.  Initial ultrasound scanning demonstrates a moderate to large left pleural effusion. The lower chest was prepped and draped in the usual sterile fashion. 1% lidocaine was used for local anesthesia.  An ultrasound image was saved for documentation purposes. A 6 Fr Safe-T-Centesis catheter was introduced.  The thoracentesis was performed. The catheter was removed and a dressing was applied. The patient tolerated the procedure well without immediate post procedural complication. The patient was escorted to have an upright chest radiograph.  FINDINGS: A total of approximately 820 cc of dark bloody fluid was removed. Requested samples were sent to the laboratory.  IMPRESSION: Successful ultrasound-guided diagnostic and therapeutic left sided thoracentesis yielding 820 cc of pleural fluid.  Read by: Rowe Robert, PA-C   Electronically Signed   By: Jerilynn Mages.  Shick M.D.   On: 07/31/2015 08:40      No results found for: HGBA1C Lab Results  Component Value Date   CREATININE 0.84 07/31/2015       Scheduled Meds: . chlorhexidine  15 mL Mouth/Throat BID  . docusate sodium  100 mg Oral BID  . feeding supplement (ENSURE ENLIVE)  237 mL Oral BID BM  . folic acid  1 mg Oral Daily  . ipratropium-albuterol  3 mL Nebulization QID  . mometasone-formoterol  1 puff Inhalation BID  . predniSONE  10 mg Oral Q breakfast  . sodium chloride  3 mL Intravenous Q12H  . tamsulosin  0.4 mg Oral Daily   Continuous Infusions:   Principal Problem:   Hypotension Active Problems:   Essential hypertension   Hemochromatosis   Pleural effusion on right   Lung cancer   Protein-calorie malnutrition, severe   Pleural effusion on left   Metastatic adenocarcinoma of unknown origin   Chronic diastolic heart failure   SOB (shortness of breath)   Generalized weakness   AKI (acute kidney injury)    Constipation   Urinary retention   Back pain    Time spent: 45 minutes   [facility] Pager [phone]. If 7PM-7AM, please contact night-coverage at [email or web address], password [facility] 07/31/2015, 11:57 AM  LOS: 2 days

## 2015-07-31 NOTE — Procedures (Signed)
US guided therapeutic paracentesis performed yielding 3.2 liters blood-tinged fluid. No immediate complications.

## 2015-08-01 ENCOUNTER — Other Ambulatory Visit: Payer: Self-pay | Admitting: Medical Oncology

## 2015-08-01 ENCOUNTER — Telehealth: Payer: Self-pay | Admitting: Adult Health

## 2015-08-01 ENCOUNTER — Telehealth: Payer: Self-pay | Admitting: Internal Medicine

## 2015-08-01 ENCOUNTER — Encounter: Payer: Self-pay | Admitting: *Deleted

## 2015-08-01 DIAGNOSIS — C799 Secondary malignant neoplasm of unspecified site: Secondary | ICD-10-CM

## 2015-08-01 LAB — CREATININE, URINE, RANDOM

## 2015-08-01 LAB — GLUCOSE, CAPILLARY: Glucose-Capillary: 119 mg/dL — ABNORMAL HIGH (ref 65–99)

## 2015-08-01 MED ORDER — ENSURE ENLIVE PO LIQD: 237.0000 mL | Freq: Two times a day (BID) | ORAL | Status: AC

## 2015-08-01 MED ORDER — OXYCODONE HCL 5 MG PO TABS: 5.0000 mg | ORAL_TABLET | ORAL | Status: DC | PRN

## 2015-08-01 MED ORDER — POLYETHYLENE GLYCOL 3350 17 G PO PACK: 17.0000 g | PACK | Freq: Every day | ORAL | Status: DC | PRN

## 2015-08-01 MED ORDER — DOCUSATE SODIUM 100 MG PO CAPS: 100.0000 mg | ORAL_CAPSULE | Freq: Two times a day (BID) | ORAL | Status: AC

## 2015-08-01 NOTE — Progress Notes (Signed)
Huel Coventry to be D/C'd Home per MD order.  Discussed prescriptions and follow up appointments with the patient. Prescriptions given to patient, medication list explained in detail. Pt verbalized understanding.    Medication List    STOP taking these medications        acetaminophen-codeine 300-30 MG per tablet  Commonly known as:  TYLENOL #3     ibuprofen 200 MG tablet  Commonly known as:  ADVIL,MOTRIN     metoprolol succinate 50 MG 24 hr tablet  Commonly known as:  TOPROL-XL     spironolactone 25 MG tablet  Commonly known as:  ALDACTONE     UNABLE TO FIND     valsartan 160 MG tablet  Commonly known as:  DIOVAN     verapamil 240 MG 24 hr capsule  Commonly known as:  VERELAN PM      TAKE these medications        dextromethorphan-guaiFENesin 30-600 MG per 12 hr tablet  Commonly known as:  MUCINEX DM  Take 1 tablet by mouth 2 (two) times daily as needed for cough.     docusate sodium 100 MG capsule  Commonly known as:  COLACE  Take 1 capsule (100 mg total) by mouth 2 (two) times daily.     DULERA 200-5 MCG/ACT Aero  Generic drug:  mometasone-formoterol  Inhale 1 puff into the lungs 2 (two) times daily.     EPIPEN 2-PAK 0.3 mg/0.3 mL Soaj injection  Generic drug:  EPINEPHrine  See admin instructions.     feeding supplement (ENSURE ENLIVE) Liqd  Take 237 mLs by mouth 2 (two) times daily between meals.     folic acid 1 MG tablet  Commonly known as:  FOLVITE  Take 1 tablet (1 mg total) by mouth daily.     furosemide 40 MG tablet  Commonly known as:  LASIX  Take 40 mg by mouth every morning.     KLOR-CON M10 10 MEQ tablet  Generic drug:  potassium chloride  Take 10 mEq by mouth 2 (two) times daily.     morphine 15 MG tablet  Commonly known as:  MSIR  Take 1 tablet (15 mg total) by mouth every 4 (four) hours as needed for severe pain.     oxyCODONE 5 MG immediate release tablet  Commonly known as:  Oxy IR/ROXICODONE  Take 1 tablet (5 mg total) by mouth  every 4 (four) hours as needed for moderate pain.     polyethylene glycol packet  Commonly known as:  MIRALAX / GLYCOLAX  Take 17 g by mouth daily as needed for moderate constipation.     predniSONE 10 MG tablet  Commonly known as:  DELTASONE  Take 1 tablet (10 mg total) by mouth daily with breakfast.     PROAIR HFA 108 (90 BASE) MCG/ACT inhaler  Generic drug:  albuterol  Inhale 1 each into the lungs 2 (two) times daily.     albuterol (2.5 MG/3ML) 0.083% nebulizer solution  Commonly known as:  PROVENTIL  Take 3 mLs (2.5 mg total) by nebulization every 6 (six) hours as needed for wheezing or shortness of breath.     tamsulosin 0.4 MG Caps capsule  Commonly known as:  FLOMAX  Take 0.4 mg by mouth daily.        Filed Vitals:   08/01/15 0910  BP: 104/65  Pulse:   Temp:   Resp:     Skin clean, dry and intact without evidence of skin break down,  no evidence of skin tears noted. IV catheter discontinued intact. Site without signs and symptoms of complications. Dressing and pressure applied. Pt denies pain at this time. No complaints noted.  An After Visit Summary was printed and given to the patient. Patient escorted via [place], and D/C home via private auto.  Lolita Rieger 08/01/2015 7:25 PM'

## 2015-08-01 NOTE — Telephone Encounter (Addendum)
Pt's sister Vaughan Basta came in states she is taking pt home from hospital wanted to r/s chemo for tomorrow. Per [facility] scheduled labs/chemo as long as pt's sister can get him up here. S/w Kim in labs she is going to check with his nurse to see about labs and if he doesn't need them she will cancel them due to having labs done in the hospital. Pt sister confirmed D/T for chemo and labs

## 2015-08-01 NOTE — Progress Notes (Signed)
Oncology Nurse Navigator Documentation  Oncology Nurse Navigator Flowsheets 08/01/2015  Navigator Encounter Type Other  Patient Visit Type Inpatient/I received a call from Survivorship NP regarding Henry [name].  Apparently, patient called NP with questions about treatment plan.  I spoke with spoke with Dr. Julien Nordmann to get an update.  I then went to speak with patient in the hospital.  I explained that he will be seen on an outpatient bases to schedule for treatment.  He had questions about treatment immunotherapy.  I explained and answered questions.    Barriers/Navigation Needs Education  Education Actor Options  Time Spent with Patient 30

## 2015-08-01 NOTE — Telephone Encounter (Signed)
I received a voicemail from Henry [name] with questions regarding his treatment plan.  He is currently hospitalized and would like to know if he will be discharged today.  He also would like to know if he is supposed to start his new chemotherapy tomorrow?    I am forwarding this message to Dr. Julien Nordmann and Norton Blizzard, RN.    Mike Craze, NP [place] [phone]

## 2015-08-01 NOTE — Discharge Summary (Signed)
Physician Discharge Summary  Henry [name] MRN: [id number] DOB/AGE: 1950-12-08 64 y.o.  PCP: Donnie Coffin, MD   Admit date: 07/29/2015 Discharge date: 08/01/2015  Discharge Diagnoses:     Principal Problem:   Hypotension Active Problems:   Essential hypertension   Hemochromatosis   Pleural effusion on right   Lung cancer   Protein-calorie malnutrition, severe   Pleural effusion on left   Metastatic adenocarcinoma of unknown origin   Chronic diastolic heart failure   SOB (shortness of breath)   Generalized weakness   AKI (acute kidney injury)   Constipation   Urinary retention   Back pain    Follow-up recommendations Follow-up with PCP in 3-5 days , including all  additional recommended appointments as below Follow-up CBC, CMP in 3-5 days      Medication List    STOP taking these medications        ibuprofen 200 MG tablet  Commonly known as:  ADVIL,MOTRIN     metoprolol succinate 50 MG 24 hr tablet  Commonly known as:  TOPROL-XL     spironolactone 25 MG tablet  Commonly known as:  ALDACTONE     UNABLE TO FIND     valsartan 160 MG tablet  Commonly known as:  DIOVAN     verapamil 240 MG 24 hr capsule  Commonly known as:  VERELAN PM      TAKE these medications        acetaminophen-codeine 300-30 MG per tablet  Commonly known as:  TYLENOL #3  Take 1-2 tablets by mouth every 4 (four) hours as needed for moderate pain.     dextromethorphan-guaiFENesin 30-600 MG per 12 hr tablet  Commonly known as:  MUCINEX DM  Take 1 tablet by mouth 2 (two) times daily as needed for cough.     docusate sodium 100 MG capsule  Commonly known as:  COLACE  Take 1 capsule (100 mg total) by mouth 2 (two) times daily.     DULERA 200-5 MCG/ACT Aero  Generic drug:  mometasone-formoterol  Inhale 1 puff into the lungs 2 (two) times daily.     EPIPEN 2-PAK 0.3 mg/0.3 mL Soaj injection  Generic drug:  EPINEPHrine  See admin instructions.     feeding supplement (ENSURE  ENLIVE) Liqd  Take 237 mLs by mouth 2 (two) times daily between meals.     folic acid 1 MG tablet  Commonly known as:  FOLVITE  Take 1 tablet (1 mg total) by mouth daily.     furosemide 40 MG tablet  Commonly known as:  LASIX  Take 40 mg by mouth every morning.     KLOR-CON M10 10 MEQ tablet  Generic drug:  potassium chloride  Take 10 mEq by mouth 2 (two) times daily.     morphine 15 MG tablet  Commonly known as:  MSIR  Take 1 tablet (15 mg total) by mouth every 4 (four) hours as needed for severe pain.     oxyCODONE 5 MG immediate release tablet  Commonly known as:  Oxy IR/ROXICODONE  Take 5 mg by mouth every 4 (four) hours as needed for moderate pain.     polyethylene glycol packet  Commonly known as:  MIRALAX / GLYCOLAX  Take 17 g by mouth daily as needed for moderate constipation.     predniSONE 10 MG tablet  Commonly known as:  DELTASONE  Take 1 tablet (10 mg total) by mouth daily with breakfast.     PROAIR HFA 108 (90 BASE)  MCG/ACT inhaler  Generic drug:  albuterol  Inhale 1 each into the lungs 2 (two) times daily.     albuterol (2.5 MG/3ML) 0.083% nebulizer solution  Commonly known as:  PROVENTIL  Take 3 mLs (2.5 mg total) by nebulization every 6 (six) hours as needed for wheezing or shortness of breath.     tamsulosin 0.4 MG Caps capsule  Commonly known as:  FLOMAX  Take 0.4 mg by mouth daily.         Discharge Condition: Guarded prognosis    Disposition: 01-Home or Self Care   Consults: None  Significant Diagnostic Studies:  Dg Chest 1 View  07/30/2015   CLINICAL DATA:  Status post left thoracentesis.  EXAM: CHEST  1 VIEW  COMPARISON:  Chest x-ray on 07/29/2015  FINDINGS: There is decrease in left pleural fluid volume after thoracentesis. Some fluid remains. No pneumothorax. Smaller right pleural effusion is suspected.  IMPRESSION: No pneumothorax after left thoracentesis with reduction in pleural fluid volume. Smaller right pleural effusion is  suspected.   Electronically Signed   By: Aletta Edouard M.D.   On: 07/30/2015 15:44   Dg Chest 1 View  07/12/2015   CLINICAL DATA:  Status post left-sided thoracentesis.  EXAM: CHEST  1 VIEW  COMPARISON:  June 28, 2014.  FINDINGS: Stable cardiomediastinal silhouette. No pneumothorax is noted. Minimal right pleural effusion is noted which is increased compared to prior exam. Right basilar opacity is noted which is stable and consistent with scarring or atelectasis. Mild left pleural effusion is noted in left lung base. Increased left upper lobe opacity is noted concerning for possible pneumonia or atelectasis. Mild central pulmonary vascular congestion is noted.  IMPRESSION: Mild central pulmonary vascular congestion. Minimal right pleural effusion is noted. Increased left upper lobe opacity is noted concerning for pneumonia or possibly atelectasis. Mild left pleural effusion is noted in left lung base status post thoracentesis. No definite pneumothorax is noted.   Electronically Signed   By: Marijo Conception, M.D.   On: 07/12/2015 10:59   Dg Chest 2 View  07/29/2015   CLINICAL DATA:  Shortness of breath. Abdominal distention. History of lung cancer. Recent chemotherapy.  EXAM: CHEST  2 VIEW  COMPARISON:  07/26/2015  FINDINGS: Cardiomediastinal silhouette is grossly unchanged, with the cardiac silhouette being partially obscured. Large left pleural effusion is unchanged with associated compressive left upper and lower lobe atelectasis. Small right pleural effusion also does not appear significantly changed. Patchy and nodular parenchymal opacities in the right lung base are unchanged. No definite new areas of airspace consolidation are seen. There is no pneumothorax. No acute osseous abnormality is seen.  IMPRESSION: Unchanged examination with large left and small right pleural effusions, associated compressive atelectasis, and chronic right basilar opacities as above.   Electronically Signed   By: Logan Bores M.D.   On: 07/29/2015 17:37   Dg Chest 2 View  07/26/2015   CLINICAL DATA:  Metastatic adenocarcinoma. Shortness of breath. Right lung cancer.  EXAM: CHEST  2 VIEW  COMPARISON:  Chest x-rays dated 07/20/2015, 07/14/2015 and 07/12/2015 and CT scan dated 07/10/2015  FINDINGS: There has been slight increase in the large left pleural effusion with further compressive atelectasis of the upper and lower lobes. Ill-defined nodular densities and chronic pleural effusion at the right base and in the right midzone appear unchanged. Pulmonary vascularity is normal.  No acute osseous abnormality.  IMPRESSION: Increased large left pleural effusion with further compression of the left upper and lower  lobes. No change on the right.   Electronically Signed   By: Lorriane Shire M.D.   On: 07/26/2015 12:48   Dg Chest 2 View  07/20/2015   CLINICAL DATA:  Follow-up left pleural effusion, persistent shortness of breath, cough, and congestion. History of lung malignancy, COPD, and previous heavy tobacco use.  EXAM: CHEST  2 VIEW  COMPARISON:  PA and lateral chest x-ray of July 14, 2015  FINDINGS: There is a persistent moderate size left pleural effusion with small right pleural effusion. The pulmonary interstitial markings are slightly less conspicuous today. The cardiac silhouette remains enlarged. The pulmonary vascularity is less engorged. There is stable tortuosity of the descending thoracic aorta. The bony thorax exhibits no acute abnormality.  IMPRESSION: Improvement in CHF with less interstitial edema. Stable moderate size left pleural effusion and small right pleural effusion.   Electronically Signed   By: David  Martinique M.D.   On: 07/20/2015 13:41   Dg Chest 2 View  07/14/2015   CLINICAL DATA:  Shortness of breath today, recent thoracentesis 2 days ago, still short of breath and not feeling much better, history hypertension, smoking, lung cancer, papillary thyroid cancer, chronic pancreatitis, irritable bowel  syndrome  EXAM: CHEST  2 VIEW  COMPARISON:  07/12/2015  FINDINGS: Enlargement of cardiac silhouette with pulmonary vascular congestion.  Bibasilar effusions and atelectasis greater on LEFT increased since previous exam.  Minimal perihilar infiltrates bilaterally likely mild pulmonary edema.  No pneumothorax.  Bones demineralized.  IMPRESSION: Persistent mild CHF with bibasilar effusions atelectasis, increased on LEFT since prior study.   Electronically Signed   By: Lavonia Dana M.D.   On: 07/14/2015 12:57   Dg Chest 2 View  07/11/2015   CLINICAL DATA:  Lung cancer on chemotherapy, increased shortness of breath for 1 week, fluid on lungs, productive cough for 3 months, hypertension  EXAM: CHEST  2 VIEW  COMPARISON:  CT chest 07/10/2015  FINDINGS: Enlargement of cardiac silhouette.  Tortuous aorta.  Mediastinal contours and pulmonary vascularity normal.  Bibasilar effusions LEFT greater than RIGHT similar to prior CT.  Bibasilar pulmonary opacities may represent infiltrate or atelectasis, little changed from prior CT.  Upper lungs grossly clear.  No pneumothorax.  Bones demineralized.  IMPRESSION: Bibasilar pleural effusions LEFT greater than RIGHT with bibasilar pulmonary opacities that may represent atelectasis or consolidation.  Little interval change.   Electronically Signed   By: Lavonia Dana M.D.   On: 07/11/2015 18:57   Dg Abd 1 View  07/29/2015   CLINICAL DATA:  Shortness of breath and abdominal distension. History of lung cancer with recent chemotherapy.  EXAM: ABDOMEN - 1 VIEW  COMPARISON:  CT abdomen and pelvis 07/10/2015  FINDINGS: There is a small to moderate amount of stool and gas in nondilated colon. There is a paucity of small bowel gas, which limits evaluation for obstruction. Increased density in the abdomen may partially reflect ascites, as was present on the prior CT. The hepatic shadow is incompletely visualized but appears mildly enlarged. The uppermost portion of the abdomen was  incompletely imaged. Mild scoliosis and moderate disc degeneration is noted in the lumbar spine.  IMPRESSION: 1. Increased density in the abdomen which may reflect ascites. 2. Small to moderate amount of stool in the colon. A paucity of small bowel gas limits evaluation for small bowel obstruction.   Electronically Signed   By: Logan Bores M.D.   On: 07/29/2015 17:42   Ct Chest W Contrast  07/29/2015   CLINICAL DATA:  Hypotension.  Lung cancer.  EXAM: CT CHEST, ABDOMEN, AND PELVIS WITH CONTRAST  TECHNIQUE: Multidetector CT imaging of the chest, abdomen and pelvis was performed following the standard protocol during bolus administration of intravenous contrast.  CONTRAST:  150m OMNIPAQUE IOHEXOL 300 MG/ML  SOLN  COMPARISON:  CT chest, abdomen, and pelvis 07/10/2015.  FINDINGS: CT CHEST FINDINGS  Small right axillary and subpectoral lymph nodes measure up to 7 mm in short axis, unchanged. No enlarged supraclavicular lymph nodes are identified. Small, scattered mediastinal lymph nodes measure up to 7 mm in short axis and are stable to minimally smaller than on the prior study. The heart is normal in size and is shifted rightward. Centrilobular emphysema is again noted.  Small right pleural effusion has not significantly changed in size, with loculated components again seen anteriorly and in a subpulmonic location. Increased right-sided pleural density is consistent with prior talc pleurodesis. Consolidative and ground-glass opacity in the right lower lobe has not significantly changed. Nodular densities in the anterior aspect of the basilar right lower lobe and in the right middle lobe are unchanged, measuring up to 2.1 cm in size (series 7, image 40). Diffuse, mild thickening along the right major and minor fissures with areas of mild nodularity is unchanged.  Large left pleural effusion has mildly increased in size overall with some loculation anteriorly. Small focus of likely loculated pleural fluid posteriorly  towards the apex is slightly smaller. There is increased compressive atelectasis on the left lower lobe and lingula, now with near complete left lower lobe collapse. No definite enhancing pleural nodules are identified. 3 mm left upper lobe nodule is unchanged (series 7, image 26). Small focus of chronic scarring in the left upper lobe is unchanged (series 7, image 12). No suspicious lytic or blastic osseous lesions are identified in the chest.  CT ABDOMEN AND PELVIS FINDINGS  The liver is enlarged, measuring approximately 20 cm in craniocaudal length. Small low-density liver lesions are unchanged and compatible with cysts, measuring up to 1.4 cm in size. Gallbladder is grossly unremarkable. Spleen and kidneys are unremarkable. Mild thickening of both adrenal glands is again noted. 13 mm mildly complex/septated cystic lesion in the pancreatic head is unchanged and may represent an IPMN.  Oral contrast in the distal esophagus may reflect dysmotility or reflux. Oral contrast is present in multiple nondilated loops of small bowel without evidence of obstruction. Three polypoid soft tissue lesions in the ascending colon are less prominent in size than on the most recent prior study and similar in size to the 05/01/2015 examination, measuring up to approximately 1.7 cm (series 2 images 80-85). There is a moderate amount of stool in the colon.  Bladder is unremarkable. Prostate is enlarged. There is mild-to-moderate atherosclerotic calcification of the abdominal aorta and its major branch vessels. 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 The SMV and portal vein appear patent. No enlarged lymph nodes are identified. There is large volume ascites, increased from the prior abdominal CT. Diffuse body wall edema is present. 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 No new suspicious osseous lesions are identified. Advanced disc degeneration is  present at L4-5.  IMPRESSION: 1. Increased size of large left pleural effusion which appears partially loculated. Near complete collapse of the left lower lobe. 2. Unchanged, partially loculated right pleural effusion. 3. Unchanged right lower lobe consolidation and nodular densities in the anterior right lung base. 4. Large volume abdominal ascites, increased from prior.  5. Decreased size of polypoid lesions in the ascending colon. 6. Unchanged L1 sclerotic vertebral metastasis. No new bone metastases identified.   Electronically Signed   By: Logan Bores M.D.   On: 07/29/2015 19:30   Ct Chest W Contrast  07/10/2015   CLINICAL DATA:  Restaging lung cancer. Initial diagnosis 2015. History of right lobectomy.  EXAM: CT CHEST, ABDOMEN, AND PELVIS WITH CONTRAST  TECHNIQUE: Multidetector CT imaging of the chest, abdomen and pelvis was performed following the standard protocol during bolus administration of intravenous contrast.  CONTRAST:  123m OMNIPAQUE IOHEXOL 300 MG/ML  SOLN  COMPARISON:  CT 05/01/2015 and PET-CT 06/30/2014  FINDINGS: CT CHEST FINDINGS  Chest wall: Stable small axillary lymph nodes but no mass or overt adenopathy. The thyroid gland appears Stable. Stable small nodules. No supraclavicular adenopathy. The bony thorax is intact. No obvious metastatic lesions involving the thoracic spine or sternum. No obvious rib lesions. Progressive metastatic disease involving the L1 vertebral body.  Mediastinum: The heart is mildly enlarged but stable. Stable tortuosity, ectasia and calcification of the thoracic aorta. Stable small scattered mediastinal lymph nodes. The esophagus is grossly normal.  Lungs/ pleura: Persistent loculated pleural fluid collection on the right side with density along the pleural surface likely due to previous talc pleurodesis. Persistent overlying long atelectasis and right basilar airspace consolidation. Stable bilobed masslike densities in the right middle lobe and right lower lobe  on image number 42.  There is a new large left pleural effusion with areas of probable loculation. I do not see any obvious enhancing pleural nodules. A left-sided thoracentesis may be useful for therapeutic and diagnostic purposes and to exclude malignant left effusion.  CT ABDOMEN AND PELVIS FINDINGS  Hepatobiliary: Stable scattered hepatic cysts. No findings suspicious for hepatic metastatic disease. The portal vein is patent.  Pancreas: Stable small cystic lesion in the pancreatic head. No inflammation or ductal dilatation.  Spleen: Normal size.  No focal lesions.  Adrenals/Urinary Tract: Bilateral nodular enlargement of both adrenal glands which could reflect nodular hyperplasia. No obvious mass/metastasis. The kidneys are unremarkable and stable.  Stomach/Bowel: The stomach wall is thickened but this is likely due to under distension. No mass or obstruction. The small bowel is grossly normal. No inflammation or obstruction. Enlarging Masses involving the ascending colon. These could be adenomas or possible metastasis.  Vascular/Lymphatic: Stable tortuosity, ectasia and calcification of the abdominal aorta. The branch vessels are patent. 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 The SMA may also be partially occluded/thrombosed. No mesenteric or retroperitoneal mass or adenopathy. Small scattered lymph nodes are noted.  Other: Progression of abdominal/ pelvic ascites without obvious omental or peritoneal surface implants. The bladder appears normal. No pelvic mass or adenopathy. No inguinal mass or adenopathy.  Musculoskeletal: No significant bony findings in the pelvis. Progressive L1 sclerotic metastatic disease.  IMPRESSION: 1. Persistent right-sided loculated pleural fluid collection and evidence of prior talc pleurodesis. No obvious enhancing pleural masses. Persistent right basilar airspace consolidation and stable bilobed masslike densities in the  right middle lobe and right lower lobe. 2. New large left pleural effusion with areas of probable loculation. No obvious enhancing pleural nodules but there left-sided thoracentesis may be useful for diagnostic and therapeutic purposes. 3. Progressive abdominal/pelvic ascites. 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 4. Stable nodular enlargement of both adrenal glands. 5. Enlarging soft tissue masses involving the ascending colon. These could be adenomas  or metastasis. 6. Progressive sclerotic metastatic disease involving the L1 vertebral body. No definite new bone metastasis.   Electronically Signed   By: Marijo Sanes M.D.   On: 07/10/2015 14:28   Ct Abdomen Pelvis W Contrast  07/29/2015   CLINICAL DATA:  Hypotension.  Lung cancer.  EXAM: CT CHEST, ABDOMEN, AND PELVIS WITH CONTRAST  TECHNIQUE: Multidetector CT imaging of the chest, abdomen and pelvis was performed following the standard protocol during bolus administration of intravenous contrast.  CONTRAST:  191m OMNIPAQUE IOHEXOL 300 MG/ML  SOLN  COMPARISON:  CT chest, abdomen, and pelvis 07/10/2015.  FINDINGS: CT CHEST FINDINGS  Small right axillary and subpectoral lymph nodes measure up to 7 mm in short axis, unchanged. No enlarged supraclavicular lymph nodes are identified. Small, scattered mediastinal lymph nodes measure up to 7 mm in short axis and are stable to minimally smaller than on the prior study. The heart is normal in size and is shifted rightward. Centrilobular emphysema is again noted.  Small right pleural effusion has not significantly changed in size, with loculated components again seen anteriorly and in a subpulmonic location. Increased right-sided pleural density is consistent with prior talc pleurodesis. Consolidative and ground-glass opacity in the right lower lobe has not significantly changed. Nodular densities in the anterior aspect  of the basilar right lower lobe and in the right middle lobe are unchanged, measuring up to 2.1 cm in size (series 7, image 40). Diffuse, mild thickening along the right major and minor fissures with areas of mild nodularity is unchanged.  Large left pleural effusion has mildly increased in size overall with some loculation anteriorly. Small focus of likely loculated pleural fluid posteriorly towards the apex is slightly smaller. There is increased compressive atelectasis on the left lower lobe and lingula, now with near complete left lower lobe collapse. No definite enhancing pleural nodules are identified. 3 mm left upper lobe nodule is unchanged (series 7, image 26). Small focus of chronic scarring in the left upper lobe is unchanged (series 7, image 12). No suspicious lytic or blastic osseous lesions are identified in the chest.  CT ABDOMEN AND PELVIS FINDINGS  The liver is enlarged, measuring approximately 20 cm in craniocaudal length. Small low-density liver lesions are unchanged and compatible with cysts, measuring up to 1.4 cm in size. Gallbladder is grossly unremarkable. Spleen and kidneys are unremarkable. Mild thickening of both adrenal glands is again noted. 13 mm mildly complex/septated cystic lesion in the pancreatic head is unchanged and may represent an IPMN.  Oral contrast in the distal esophagus may reflect dysmotility or reflux. Oral contrast is present in multiple nondilated loops of small bowel without evidence of obstruction. Three polypoid soft tissue lesions in the ascending colon are less prominent in size than on the most recent prior study and similar in size to the 05/01/2015 examination, measuring up to approximately 1.7 cm (series 2 images 80-85). There is a moderate amount of stool in the colon.  Bladder is unremarkable. Prostate is enlarged. There is mild-to-moderate atherosclerotic calcification of the abdominal aorta and its major branch vessels. The splenic vein is likely  chronically occluded, with multiple collateral veins again seen in the upper abdomen. The SMV and portal vein appear patent. No enlarged lymph nodes are identified. There is large volume ascites, increased from the prior abdominal CT. Diffuse body wall edema is present. 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 No new suspicious osseous lesions are identified. Advanced disc degeneration is present at L4-5.  IMPRESSION: 1. Increased size  of large left pleural effusion which appears partially loculated. Near complete collapse of the left lower lobe. 2. Unchanged, partially loculated right pleural effusion. 3. Unchanged right lower lobe consolidation and nodular densities in the anterior right lung base. 4. Large volume abdominal ascites, increased from prior. 5. Decreased size of polypoid lesions in the ascending colon. 6. Unchanged L1 sclerotic vertebral metastasis. No new bone metastases identified.   Electronically Signed   By: Logan Bores M.D.   On: 07/29/2015 19:30   Ct Abdomen Pelvis W Contrast  07/10/2015   CLINICAL DATA:  Restaging lung cancer. Initial diagnosis 2015. History of right lobectomy.  EXAM: CT CHEST, ABDOMEN, AND PELVIS WITH CONTRAST  TECHNIQUE: Multidetector CT imaging of the chest, abdomen and pelvis was performed following the standard protocol during bolus administration of intravenous contrast.  CONTRAST:  167m OMNIPAQUE IOHEXOL 300 MG/ML  SOLN  COMPARISON:  CT 05/01/2015 and PET-CT 06/30/2014  FINDINGS: CT CHEST FINDINGS  Chest wall: Stable small axillary lymph nodes but no mass or overt adenopathy. The thyroid gland appears Stable. Stable small nodules. No supraclavicular adenopathy. The bony thorax is intact. No obvious metastatic lesions involving the thoracic spine or sternum. No obvious rib lesions. Progressive metastatic disease involving the L1 vertebral body.  Mediastinum: The heart is mildly enlarged but stable. Stable tortuosity, ectasia and calcification of the thoracic  aorta. Stable small scattered mediastinal lymph nodes. The esophagus is grossly normal.  Lungs/ pleura: Persistent loculated pleural fluid collection on the right side with density along the pleural surface likely due to previous talc pleurodesis. Persistent overlying long atelectasis and right basilar airspace consolidation. Stable bilobed masslike densities in the right middle lobe and right lower lobe on image number 42.  There is a new large left pleural effusion with areas of probable loculation. I do not see any obvious enhancing pleural nodules. A left-sided thoracentesis may be useful for therapeutic and diagnostic purposes and to exclude malignant left effusion.  CT ABDOMEN AND PELVIS FINDINGS  Hepatobiliary: Stable scattered hepatic cysts. No findings suspicious for hepatic metastatic disease. The portal vein is patent.  Pancreas: Stable small cystic lesion in the pancreatic head. No inflammation or ductal dilatation.  Spleen: Normal size.  No focal lesions.  Adrenals/Urinary Tract: Bilateral nodular enlargement of both adrenal glands which could reflect nodular hyperplasia. No obvious mass/metastasis. The kidneys are unremarkable and stable.  Stomach/Bowel: The stomach wall is thickened but this is likely due to under distension. No mass or obstruction. The small bowel is grossly normal. No inflammation or obstruction. Enlarging Masses involving the ascending colon. These could be adenomas or possible metastasis.  Vascular/Lymphatic: Stable tortuosity, ectasia and calcification of the abdominal aorta. The branch vessels are patent. 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 The SMA may also be partially occluded/thrombosed. No mesenteric or retroperitoneal mass or adenopathy. Small scattered lymph nodes are noted.  Other: Progression of abdominal/ pelvic ascites without obvious omental or peritoneal surface implants. The bladder appears normal. No pelvic  mass or adenopathy. No inguinal mass or adenopathy.  Musculoskeletal: No significant bony findings in the pelvis. Progressive L1 sclerotic metastatic disease.  IMPRESSION: 1. Persistent right-sided loculated pleural fluid collection and evidence of prior talc pleurodesis. No obvious enhancing pleural masses. Persistent right basilar airspace consolidation and stable bilobed masslike densities in the right middle lobe and right lower lobe. 2. New large left pleural effusion with areas of probable loculation. No obvious enhancing pleural nodules but there left-sided thoracentesis may be  useful for diagnostic and therapeutic purposes. 3. Progressive abdominal/pelvic ascites. 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 4. Stable nodular enlargement of both adrenal glands. 5. Enlarging soft tissue masses involving the ascending colon. These could be adenomas or metastasis. 6. Progressive sclerotic metastatic disease involving the L1 vertebral body. No definite new bone metastasis.   Electronically Signed   By: Marijo Sanes M.D.   On: 07/10/2015 14:28   US Paracentesis  07/31/2015   INDICATION: Metastatic lung cancer, recurrent malignant ascites. Request is made for therapeutic paracentesis.  EXAM: ULTRASOUND-GUIDED THERAPEUTIC PARACENTESIS  COMPARISON:  Prior paracentesis on 07/13/2015  MEDICATIONS: None.  COMPLICATIONS: None immediate  TECHNIQUE: Informed written consent was obtained from the patient after a discussion of the risks, benefits and alternatives to treatment. A timeout was performed prior to the initiation of the procedure.  Initial ultrasound scanning demonstrates a moderate amount of ascites within the right lower abdominal quadrant. The right lower abdomen was prepped and draped in the usual sterile fashion. 1% lidocaine was used for local anesthesia. Under direct ultrasound guidance, a 19 gauge, 10-cm,  Yueh catheter was introduced. An ultrasound image was saved for documentation purposed. The paracentesis was performed. The catheter was removed and a dressing was applied. The patient tolerated the procedure well without immediate post procedural complication.  FINDINGS: A total of approximately 3.2 liters of blood-tinged fluid was removed.  IMPRESSION: Successful ultrasound-guided therapeutic paracentesis yielding 3.2 liters of peritoneal fluid.  Read by: Rowe Robert, PA-C   Electronically Signed   By: Jacqulynn Cadet M.D.   On: 07/31/2015 11:35   US Paracentesis  07/13/2015   INDICATION: Ascites, request for diagnostic and therapeutic paracentesis.  EXAM: ULTRASOUND-GUIDED PARACENTESIS  COMPARISON:  None.  MEDICATIONS: None.  COMPLICATIONS: None immediate  TECHNIQUE: Informed written consent was obtained from the patient after a discussion of the risks, benefits and alternatives to treatment. A timeout was performed prior to the initiation of the procedure.  Initial ultrasound scanning demonstrates a small amount of ascites within the right upper abdominal quadrant. The right upper abdomen was prepped and draped in the usual sterile fashion. 1% lidocaine was used for local anesthesia.  Under direct ultrasound guidance, a 19 gauge, 7-cm, Yueh catheter was introduced. An ultrasound image was saved for documentation purposed. The paracentesis was performed. The catheter was removed and a dressing was applied. The patient tolerated the procedure well without immediate post procedural complication.  FINDINGS: A total of approximately 600 ml of serous fluid was removed. Samples were sent to the laboratory as requested by the clinical team.  IMPRESSION: Successful ultrasound-guided paracentesis yielding 600 ml of peritoneal fluid.  Read By:  Tsosie Billing PA-C   Electronically Signed   By: Jerilynn Mages.  Shick M.D.   On: 07/13/2015 14:39   US Thoracentesis Asp Pleural Space W/img Guide  07/31/2015   INDICATION: Patient  with history of lung and thyroid cancers, dyspnea, recurrent left pleural effusion. Request is made for diagnostic and therapeutic left thoracentesis .  EXAM: ULTRASOUND GUIDED DIAGNOSTIC AND THERAPEUTIC LEFT THORACENTESIS  COMPARISON:  Prior thoracentesis on 07/12/2015  MEDICATIONS: None  COMPLICATIONS: None immediate  TECHNIQUE: Informed written consent was obtained from the patient after a discussion of the risks, benefits and alternatives to treatment. A timeout was performed prior to the initiation of the procedure.  Initial ultrasound scanning demonstrates a moderate to large left pleural effusion. The lower chest was prepped and draped in the usual sterile  fashion. 1% lidocaine was used for local anesthesia.  An ultrasound image was saved for documentation purposes. A 6 Fr Safe-T-Centesis catheter was introduced. The thoracentesis was performed. The catheter was removed and a dressing was applied. The patient tolerated the procedure well without immediate post procedural complication. The patient was escorted to have an upright chest radiograph.  FINDINGS: A total of approximately 820 cc of dark bloody fluid was removed. Requested samples were sent to the laboratory.  IMPRESSION: Successful ultrasound-guided diagnostic and therapeutic left sided thoracentesis yielding 820 cc of pleural fluid.  Read by: Rowe Robert, PA-C   Electronically Signed   By: Jerilynn Mages.  Shick M.D.   On: 07/31/2015 08:40   US Thoracentesis Asp Pleural Space W/img Guide  07/12/2015   INDICATION: Patient with history of lung and thyroid cancers, dyspnea, left pleural effusion. Request is made for diagnostic and therapeutic left thoracentesis.  EXAM: ULTRASOUND GUIDED DIAGNOSTIC AND THERAPEUTIC LEFT THORACENTESIS  COMPARISON:  None.  MEDICATIONS: None  COMPLICATIONS: None immediate  TECHNIQUE: Informed written consent was obtained from the patient after a discussion of the risks, benefits and alternatives to treatment. A timeout was performed  prior to the initiation of the procedure.  Initial ultrasound scanning demonstrates a moderate to large left pleural effusion. The lower chest was prepped and draped in the usual sterile fashion. 1% lidocaine was used for local anesthesia.  An ultrasound image was saved for documentation purposes. A 6 Fr Safe-T-Centesis catheter was introduced. The thoracentesis was performed. The catheter was removed and a dressing was applied. The patient tolerated the procedure well without immediate post procedural complication. The patient was escorted to have an upright chest radiograph.  FINDINGS: A total of approximately 1 liter of turbid, dark bloody fluid was removed. Requested samples were sent to the laboratory.  IMPRESSION: Successful ultrasound-guided diagnostic/therapeutic left sided thoracentesis yielding 1 liter of pleural fluid.  Read by: Rowe Robert, PA-C   Electronically Signed   By: Aletta Edouard M.D.   On: 07/12/2015 11:05        Filed Weights   07/30/15 1200 07/31/15 0523 08/01/15 0521  Weight: 69.4 kg (153 lb) 67.2 kg (148 lb 2.4 oz) 67.2 kg (148 lb 2.4 oz)     Microbiology: Recent Results (from the past 240 hour(s))  Culture, blood (routine x 2)     Status: None (Preliminary result)   Collection Time: 07/30/15  2:25 PM  Result Value Ref Range Status   Specimen Description BLOOD RIGHT ARM  Final   Special Requests BOTTLES DRAWN AEROBIC AND ANAEROBIC 10CC  Final   Culture   Final    NO GROWTH 1 DAY Performed at [facility]    Report Status PENDING  Incomplete  Culture, blood (routine x 2)     Status: None (Preliminary result)   Collection Time: 07/30/15  2:30 PM  Result Value Ref Range Status   Specimen Description BLOOD LEFT ARM  Final   Special Requests BOTTLES DRAWN AEROBIC AND ANAEROBIC 10CC  Final   Culture   Final    NO GROWTH 1 DAY Performed at [facility]    Report Status PENDING  Incomplete  Culture, body fluid-bottle     Status: None (Preliminary  result)   Collection Time: 07/30/15  3:15 PM  Result Value Ref Range Status   Specimen Description FLUID PLEURAL  Final   Special Requests BOTTLES DRAWN AEROBIC AND ANAEROBIC 5CC  Final   Culture   Final    NO GROWTH <  30 HOURS Performed at [facility]    Report Status PENDING  Incomplete  Gram stain     Status: None   Collection Time: 07/30/15  3:15 PM  Result Value Ref Range Status   Specimen Description FLUID PLEURAL  Final   Special Requests NONE  Final   Gram Stain   Final    ABUNDANT WBC PRESENT, PREDOMINANTLY MONONUCLEAR NO ORGANISMS SEEN Performed at [facility]    Report Status 07/30/2015 FINAL  Final       Blood Culture    Component Value Date/Time   SDES FLUID PLEURAL 07/30/2015 1515   SDES FLUID PLEURAL 07/30/2015 1515   SPECREQUEST BOTTLES DRAWN AEROBIC AND ANAEROBIC 5CC 07/30/2015 1515   SPECREQUEST NONE 07/30/2015 1515   CULT  07/30/2015 1515    NO GROWTH < 24 HOURS Performed at [facility] PENDING 07/30/2015 1515   REPTSTATUS 07/30/2015 FINAL 07/30/2015 1515      Labs: Results for orders placed or performed during the hospital encounter of 07/29/15 (from the past 48 hour(s))  Culture, blood (routine x 2)     Status: None (Preliminary result)   Collection Time: 07/30/15  2:25 PM  Result Value Ref Range   Specimen Description BLOOD RIGHT ARM    Special Requests BOTTLES DRAWN AEROBIC AND ANAEROBIC 10CC    Culture      NO GROWTH 1 DAY Performed at [facility]    Report Status PENDING   Culture, blood (routine x 2)     Status: None (Preliminary result)   Collection Time: 07/30/15  2:30 PM  Result Value Ref Range   Specimen Description BLOOD LEFT ARM    Special Requests BOTTLES DRAWN AEROBIC AND ANAEROBIC 10CC    Culture      NO GROWTH 1 DAY Performed at [facility]    Report Status PENDING   Glucose, pleural or peritoneal fluid     Status: None   Collection Time: 07/30/15  3:11 PM   Result Value Ref Range   Glucose, Fluid <20 mg/dL    Comment: REPEATED TO VERIFY (NOTE) No normal range established for this test Results should be evaluated in conjunction with serum values Performed at [place]: CORRECTED ON 09/12 AT 1536: PREVIOUSLY REPORTED AS Pleural, L  Protein, fluid - pleural or peritoneal     Status: None   Collection Time: 07/30/15  3:11 PM  Result Value Ref Range   Total protein, fluid 3.2 g/dL    Comment: (NOTE) No normal range established for this test Results should be evaluated in conjunction with serum values Performed at [facility]    Fluid Type-FTP PLEURAL     Comment: CORRECTED ON 09/12 AT 1537: PREVIOUSLY REPORTED AS Pleural, L  Body fluid cell count with differential     Status: Abnormal   Collection Time: 07/30/15  3:11 PM  Result Value Ref Range   Fluid Type-FCT PLEURAL     Comment: CORRECTED ON 09/12 AT 1536: PREVIOUSLY REPORTED AS Pleural, L   Color, Fluid BROWN (A) YELLOW   Appearance, Fluid TURBID (A) CLEAR   WBC, Fluid 1133 (H) 0 - 1000 cu mm   Neutrophil Count, Fluid 11 0 - 25 %   Lymphs, Fluid 55 %   Monocyte-Macrophage-Serous Fluid 30 (L) 50 - 90 %   Eos, Fluid 4 %   Other Cells, Fluid OTHER CELLS UNIDENTIFIED; SEE  CYTOLOGY REPORT %  Lactate dehydrogenase (CSF, pleural or peritoneal fluid)     Status: Abnormal   Collection Time: 07/30/15  3:11 PM  Result Value Ref Range   LD, Fluid 2024 (H) 3 - 23 U/L    Comment: (NOTE) Results should be evaluated in conjunction with serum values Performed at [facility]    Fluid Type-FLDH PLEURAL     Comment: CORRECTED ON 09/12 AT 1537: PREVIOUSLY REPORTED AS Pleural, L  Culture, body fluid-bottle     Status: None (Preliminary result)   Collection Time: 07/30/15  3:15 PM  Result Value Ref Range   Specimen Description FLUID PLEURAL    Special Requests BOTTLES DRAWN AEROBIC AND ANAEROBIC 5CC    Culture      NO GROWTH <  24 HOURS Performed at [facility]    Report Status PENDING   Gram stain     Status: None   Collection Time: 07/30/15  3:15 PM  Result Value Ref Range   Specimen Description FLUID PLEURAL    Special Requests NONE    Gram Stain      ABUNDANT WBC PRESENT, PREDOMINANTLY MONONUCLEAR NO ORGANISMS SEEN Performed at [facility]    Report Status 07/30/2015 FINAL   Amylase, Pleural Fluid     Status: None   Collection Time: 07/30/15  3:16 PM  Result Value Ref Range   Amylase, Pleural Fluid 9 U/L    Comment: (NOTE) Result repeated and verified. Reference range: Values are considered abnormal if they are greater than or equal to two times a simultaneously analyzed serum value. Performed at [facility]   [place], Body Fluid     Status: None   Collection Time: 07/30/15  3:16 PM  Result Value Ref Range   pH, Body Fluid 7.5 Not Estab.    Comment: (NOTE) This test was developed and its performance characteristics determined by [facility]. It has not been cleared or approved by the Food and Drug Administration. Performed At: [facility] [place], [place] [id number] Lindon Romp MD [id number]    Source of Sample PLEURAL   CBC     Status: Abnormal   Collection Time: 07/31/15  6:15 AM  Result Value Ref Range   WBC 10.8 (H) 4.0 - 10.5 K/uL   RBC 3.54 (L) 4.22 - 5.81 MIL/uL   Hemoglobin 12.2 (L) 13.0 - 17.0 g/dL   HCT 36.8 (L) 39.0 - 52.0 %   MCV 104.0 (H) 78.0 - 100.0 fL   MCH 34.5 (H) 26.0 - 34.0 pg   MCHC 33.2 30.0 - 36.0 g/dL   RDW 15.3 11.5 - 15.5 %   Platelets 287 150 - 400 K/uL  Comprehensive metabolic panel     Status: Abnormal   Collection Time: 07/31/15  6:15 AM  Result Value Ref Range   Sodium 140 135 - 145 mmol/L   Potassium 3.7 3.5 - 5.1 mmol/L   Chloride 100 (L) 101 - 111 mmol/L   CO2 36 (H) 22 - 32 mmol/L   Glucose, Bld 109 (H) 65 - 99 mg/dL   BUN 21 (H) 6 - 20 mg/dL   Creatinine, Ser 0.84 0.61 - 1.24 mg/dL   Calcium  9.1 8.9 - 10.3 mg/dL   Total Protein 5.9 (L) 6.5 - 8.1 g/dL   Albumin 2.9 (L) 3.5 - 5.0 g/dL   AST 19 15 - 41 U/L   ALT 18 17 - 63 U/L   Alkaline Phosphatase 60 38 - 126  U/L   Total Bilirubin 0.5 0.3 - 1.2 mg/dL   GFR calc non Af Amer >60 >60 mL/min   GFR calc Af Amer >60 >60 mL/min    Comment: (NOTE) The eGFR has been calculated using the CKD EPI equation. This calculation has not been validated in all clinical situations. eGFR's persistently <60 mL/min signify possible Chronic Kidney Disease.    Anion gap 4 (L) 5 - 15  Glucose, capillary     Status: None   Collection Time: 07/31/15  9:03 AM  Result Value Ref Range   Glucose-Capillary 96 65 - 99 mg/dL   Comment 1 Notify RN    Comment 2 Document in Chart   Creatinine, urine, random     Status: None   Collection Time: 08/01/15  5:24 AM  Result Value Ref Range   Creatinine, Urine <10 mg/dL    Comment: REPEATED TO VERIFY Performed at [facility]   Glucose, capillary     Status: Abnormal   Collection Time: 08/01/15  7:52 AM  Result Value Ref Range   Glucose-Capillary 119 (H) 65 - 99 mg/dL     Lipid Panel  No results found for: CHOL, TRIG, HDL, CHOLHDL, VLDL, LDLCALC, LDLDIRECT   No results found for: HGBA1C   Lab Results  Component Value Date   CREATININE 0.84 07/31/2015     HPI :*Henry [name] is a 64 y.o. male with PMH of hypertension, metastasized Lung cancer(s/p of chemotherapy), thyroid cancer, adenocarcinoma with unknown origin, bilateral pleural effusion, who presents with hypotension, urinary retention, lower back pain, suprapubic abdominal pain, sob  Patient reports that he has urinary retention which has been going on for almost a year. He was recently started with the Flomax by his oncologist, Dr. Julien Nordmann. He has been compliant to this medication, but still has some difficult urinating. He has urgency, but no dysuria or burning on urination. He complains of mild suprapubic abdominal pain. Sister  reports patient has been hypotensive the last 2 days, yesterday systolic pressure was 88B and only earlier today did pressure rise to 16X systolic. Patient also has abdominal distention. Patient reports severe lower low back pain that also started 2 days ago, without leg weakness or tingling. He reports that has SOB which is slightly worse than his baseline. No chest pain. He has mild cough without sputum production. No fever or chills. He reports that he is constipated recently. Patient reports that his is scheduled for thoracentesis tomorrow.  In ED, patient was found to have WBC 11.0, temperature normal, soft blood pressure with SBP 80 to 90 mmHg, AKI, lactate 0.64, BNP 30.7, negative urinalysis. CT-ches and CT-abd/pelvis showed: 1. increased size of large left pleural effusion which appears partially loculated. Near complete collapse of the left lower lobe. 2. Unchanged, partially loculated right pleural effusion. 3. Unchanged right lower lobe consolidation and nodular densities in the anterior right lung base. 4. Large volume abdominal ascites, increased from prior. 5. Decreased size of polypoid lesions in the ascending colon. 6. Unchanged L1 sclerotic vertebral metastasis. No new bone metastases identified.   HOSPITAL COURSE:   Hypotension: Improved after discontinuation of most antihypertensives medications that the patient was taking at home, Is likely due to multiple blood pressure medications. He is taking Lasix 40 mg daily, metoprolol 50 mg daily, spironolactone 25 mg daily, Flomax 0.4 mg daily, Diovan 160 mg daily, verapamil 240 g daily. Patient does not seem to have sepsis clinically. His lactate is 0.64. He has a mild leukocytosis with  WBC 11, but no fever and tachycardia.  Held Lasix, spironolactone, Diovan, verapamil, metoprolol during this admission Given Solu Cortef 50 mg 1 for stress dose on admission cortisol level- 40.2  Blood pressure continues to be soft after thoracentesis/  paracentesis  , no evidence of SBP   AKI: Resolved, Likely due to prerenal secondary to hypotension and continuation of ARB, diruetics, NSAIDs. No hydronephrosis by CT abdomen/pelvis Follow up renal function by BMP Continue to hold Diovan, ibuprofen, and lasix  Malignant ascites/malignant pleural effusion left thoracentesis performed yielding 820 cc dark bloody fluid, follow-up chest x-ray shows improvement Status post therapeutic paracentesis performed yielding 3.2 liters blood-tinged fluid   discharge with home health to follow-up with oncology for further recommendations   Essential hypertension: now with hypotension Holding home medications  Shortness of breath and bilateral pleural effusion: Patient's worsening shortness of breath is likely due to worsening L pleural effusion as evidenced with CT chest. Large left pleural effusion appears partially loculated, but patient does not have fever or CP, clinically not septic. Patient scheduled for thoracentesis by IR as above Continue - Duoneb Nebs, Dulera inhaler when necessary albuterol nebulizer,- Mucinex for cough - On prednison 10 mg daily, - give Solu Cortef 50 mg 1 for stress do   Metastasized lung cancer: s/p of chemotherapy. Last dose was 5 weeks ago. Followed up with Dr. Julien Nordmann. Plan to do immunotherapy coming Thursday.   Metastatic adenocarcinoma of unknown origin: -Follow-up with Dr. Julien Nordmann  Protein-calorie malnutrition, severe: -Ensure  Chronic diastolic heart failure: 2-D echo on 07/15/15 showed EF of 60-65% with grade 1 diastolic dysfunction. BNP 30.7. Patient has 1+ leg edema. His leg edema is partially from his position since patient has been siting most of the time. CHF is clinically compensated. -Hold Lasix and spironolactone given hypotension -Watched volume status closely, may restart Lasix if PCP desires and if blood pressure is stable  Generalized weakness: Likely due to generalized deconditioning due to  multiple comorbidities. -PT/OT  Constipation: -MiraLAX and Colace  Urinary retention: Urinalysis negative. -When necessary in/out catheter -Flomax  Lower back pain: No alarming symptoms, such as leg weakness or tingling sensation. Most likely due to metastasized disease. CT showed unchanged L1 sclerotic vertebral metastasis. -Continue home morphine and oxycodone when necessary -Tylenol  Discharge Exam:   Blood pressure 104/65, pulse 91, temperature 97.8 F (36.6 C), temperature source Oral, resp. rate 20, height 6' (1.829 m), weight 67.2 kg (148 lb 2.4 oz), SpO2 92 %.   General: No acute respiratory distress Lungs: Clear to auscultation bilaterally without wheezes or crackles Cardiovascular: Regular rate and rhythm without murmur gallop or rub normal S1 and S2 Abdomen: Nontender, nondistended, soft, bowel sounds positive, no rebound, no ascites, no appreciable mass Extremities: No significant cyanosis, clubbing, or edema bilateral lower extremities       Discharge Instructions    Diet - low sodium heart healthy    Complete by:  As directed      Increase activity slowly    Complete by:  As directed            Follow-up Information    Follow up with [facility].   Why:  3n1   Contact information:   [address] [place] [address] [phone]       Follow up with [facility].   Why:  HHPT/OT/HH Nurse's aide   Contact information:   [address] [place] [address] [phone]  Follow up with Donnie Coffin, MD. Schedule an appointment as soon as possible for a visit in 3 days.   Specialty:  Family Medicine   Contact information:   [address] [place] [address] [phone]       Follow up with Eilleen Kempf., MD. Schedule an appointment as soon as possible for a visit in 3 days.   Specialty:  Oncology   Contact information:   [address] [place]  [address] [phone]       Signed: Reyne Dumas 08/01/2015, 12:55 PM        Time spent >45 mins

## 2015-08-01 NOTE — Care Management Note (Signed)
Case Management Note  Patient Details  Name: Henry [name] MRN: [id number] Date of Birth: 25-Mar-1951  Subjective/Objective: Spoke to patient about d/c needs.He wants transfer tub bench.He has home 02 but wants [facility] dme to check the equipment in home.[facility] dme rep informed of 02 issues @ home-she will f/u.May need home 02 tank brought to rm.[facility] rep [name] already following, aware of orders-HHPT/OT/Nurse's aide, awaiting d/c order.                   Action/Plan:d/c home w/HHC/DME.   Expected Discharge Date:   (unknown)               Expected Discharge Plan:  [place]  In-House Referral:     Discharge planning Services  CM Consult  Post Acute Care Choice:    Choice offered to:  Patient  DME Arranged:  3-N-1 DME Agency:  [place]:  PT, OT, Nurse's Aide [place] Agency:  [facility]  Status of Service:  In process, will continue to follow  Medicare Important Message Given:    Date Medicare IM Given:    Medicare IM give by:    Date Additional Medicare IM Given:    Additional Medicare Important Message give by:     If discussed at Central of Stay Meetings, dates discussed:    Additional Comments:  Dessa Phi, RN 08/01/2015, 10:57 AM

## 2015-08-01 NOTE — Care Management Note (Signed)
Case Management Note  Patient Details  Name: Henry [name] MRN: [id number] Date of Birth: 19-Oct-1951  Subjective/Objective: Noted home 02 order.[facility]-rep Kristen  & DME rep Pura Spice aware of orders(will deliver dme to rm), & following for d/c.[facility] me rep Pura Spice will confirm w/patient exactly what dme he wants them to deliver, & also confirm if portable home 02 tank needs to be brought to hospital.                   Action/Plan:d/c plan home w/HHC/DME.   Expected Discharge Date:   (unknown)               Expected Discharge Plan:  [place]  In-House Referral:     Discharge planning Services  CM Consult  Post Acute Care Choice:    Choice offered to:  Patient  DME Arranged:  3-N-1, Tub bench, Oxygen DME Agency:  [place]:  PT, OT, Nurse's Aide Vaughn Agency:  [place]  Status of Service:  In process, will continue to follow  Medicare Important Message Given:    Date Medicare IM Given:    Medicare IM give by:    Date Additional Medicare IM Given:    Additional Medicare Important Message give by:     If discussed at [place] of Stay Meetings, dates discussed:    Additional Comments:  Dessa Phi, RN 08/01/2015, 1:45 PM

## 2015-08-01 NOTE — Progress Notes (Addendum)
Occupational Therapy Treatment Patient Details Name: Henry [name] MRN: [id number] DOB: Dec 28, 1950 Today's Date: 08/01/2015    History of present illness 64 y.o. male with PMH of hypertension, metastasized Lung cancer(s/p of chemotherapy), thyroid cancer, adenocarcinoma with unknown origin, bilateral pleural effusion, who presents with hypotension, urinary retention, lower back pain, suprapubic abdominal pain, sob   OT comments  Discussed energy conservation strategies and issued handout with information. Also attempted to practice tub transfer but pt stating he felt his back is too stiff to attempt at this time. Discussed and showed pt a picture of tub transfer bench and pt is inquiring about cost. Informed case manager and per her report, she will discuss this with pt. Recommend HHOT to follow up on energy conservation strategies and safety with ADL. Also reviewed option of reacher to help with energy conservation.   Follow Up Recommendations  Home health OT;Supervision - Intermittent    Equipment Recommendations  3 in 1 bedside comode;Tub/shower bench (if pt agreeable to tubbench)    Recommendations for Other Services      Precautions / Restrictions Precautions Precaution Comments: monitor O2 and BP Restrictions Weight Bearing Restrictions: No       Mobility Bed Mobility Overal bed mobility: Modified Independent             General bed mobility comments: HOB raised.   Transfers Overall transfer level: Needs assistance   Transfers: Sit to/from Stand Sit to Stand: Supervision         General transfer comment: cues to not pull up on walker, use EOB to stand or armrest of chair.     Balance                                   ADL                           Toilet Transfer: Min guard;Stand-pivot;RW             General ADL Comments: Issued energy conservation handout and reviewed several strategies with pt and encouraged him to read  through handout and see what other information may be helpful to him. Breakfast tray arriving during session but pt agreed to practice tub transfer. Pt approached tub height surface and then stated he didnt think he could step over due to his back feeling so stiff today. he states, "I should have gotten up more." Discussed tub trasnfer option as a safer option transferring in and out of tub. Showed him a picture and explained how he can "sit and pivot" LEs into tub. Pt inquring about cost. Informed case manager who will follow up on price with pt per her.       Vision                     Perception     Praxis      Cognition   Behavior During Therapy: [facility] for tasks assessed/performed Overall Cognitive Status: Within Functional Limits for tasks assessed                       Extremity/Trunk Assessment               Exercises     Shoulder Instructions       General Comments      Pertinent Vitals/ Pain  Pain Location: pt states low back is "stiff" but didnt rate. Pain Descriptors / Indicators: Other (Comment) (stiff. ) Pain Intervention(s): Repositioned;Monitored during session  Home Living                                          Prior Functioning/Environment              Frequency Min 2X/week     Progress Toward Goals  OT Goals(current goals can now be found in the care plan section)  Progress towards OT goals: Progressing toward goals     Plan Discharge plan needs to be updated    Co-evaluation                 End of Session Equipment Utilized During Treatment: Rolling walker;Oxygen   Activity Tolerance Patient tolerated treatment well   Patient Left in chair;with call bell/phone within reach   Nurse Communication          Time: 8441-7127 OT Time Calculation (min): 33 min  Charges: OT General Charges $OT Visit: 1 Procedure OT Treatments $Self Care/Home Management : 8-22 mins $Therapeutic  Activity: 8-22 mins  Jules Schick  [phone] 08/01/2015, 9:46 AM

## 2015-08-02 ENCOUNTER — Ambulatory Visit: Payer: BLUE CROSS/BLUE SHIELD

## 2015-08-02 ENCOUNTER — Other Ambulatory Visit (HOSPITAL_BASED_OUTPATIENT_CLINIC_OR_DEPARTMENT_OTHER): Payer: BLUE CROSS/BLUE SHIELD

## 2015-08-02 ENCOUNTER — Other Ambulatory Visit: Payer: BLUE CROSS/BLUE SHIELD

## 2015-08-02 ENCOUNTER — Telehealth: Payer: Self-pay | Admitting: *Deleted

## 2015-08-02 ENCOUNTER — Ambulatory Visit: Payer: BLUE CROSS/BLUE SHIELD | Admitting: Internal Medicine

## 2015-08-02 ENCOUNTER — Telehealth: Payer: Self-pay | Admitting: Medical Oncology

## 2015-08-02 ENCOUNTER — Ambulatory Visit (HOSPITAL_BASED_OUTPATIENT_CLINIC_OR_DEPARTMENT_OTHER): Payer: BLUE CROSS/BLUE SHIELD

## 2015-08-02 VITALS — BP 104/63 | HR 88 | Temp 98.4°F | Resp 22

## 2015-08-02 DIAGNOSIS — E876 Hypokalemia: Secondary | ICD-10-CM

## 2015-08-02 DIAGNOSIS — R180 Malignant ascites: Secondary | ICD-10-CM | POA: Diagnosis not present

## 2015-08-02 DIAGNOSIS — Z79899 Other long term (current) drug therapy: Secondary | ICD-10-CM

## 2015-08-02 DIAGNOSIS — C3411 Malignant neoplasm of upper lobe, right bronchus or lung: Secondary | ICD-10-CM

## 2015-08-02 DIAGNOSIS — Z5112 Encounter for antineoplastic immunotherapy: Secondary | ICD-10-CM

## 2015-08-02 DIAGNOSIS — C3490 Malignant neoplasm of unspecified part of unspecified bronchus or lung: Secondary | ICD-10-CM

## 2015-08-02 DIAGNOSIS — C801 Malignant (primary) neoplasm, unspecified: Secondary | ICD-10-CM | POA: Diagnosis not present

## 2015-08-02 DIAGNOSIS — C799 Secondary malignant neoplasm of unspecified site: Secondary | ICD-10-CM

## 2015-08-02 LAB — UREA NITROGEN, URINE: Urea Nitrogen, Ur: 17 mg/dL

## 2015-08-02 LAB — TSH CHCC: TSH: 2.184 m[IU]/L (ref 0.320–4.118)

## 2015-08-02 MED ORDER — SODIUM CHLORIDE 0.9 % IV SOLN
Freq: Once | INTRAVENOUS | Status: AC
  Administered 2015-08-02: 11:00:00 via INTRAVENOUS

## 2015-08-02 MED ORDER — ALBUTEROL SULFATE (2.5 MG/3ML) 0.083% IN NEBU
INHALATION_SOLUTION | RESPIRATORY_TRACT | Status: AC
  Filled 2015-08-02: qty 3

## 2015-08-02 MED ORDER — SODIUM CHLORIDE 0.9 % IV SOLN
2.8000 mg/kg | Freq: Once | INTRAVENOUS | Status: AC
  Administered 2015-08-02: 200 mg via INTRAVENOUS
  Filled 2015-08-02: qty 20

## 2015-08-02 NOTE — Telephone Encounter (Signed)
Patient called reporting "B/P = 99/68.  I am having back pain, want to take medication but do not want my B/P to drop and cause today's treatment to be cancelled.  I also want a breathing treatment today.  The nebulizer machine I have at home is not as good as what they have in the hospital.  I think they used two meds and I only have one."  No cough, audible respiratory distress but admits to s.o.b.  Instructed to notify treatment nurse.  Will take aleve for back pain and also notify treatment nurse of his pain.

## 2015-08-02 NOTE — Telephone Encounter (Signed)
Pt requesting increase oxygen flow to 3 liters with exertion and face shield to deliver nebulizer tx. He cannot get his current machine to go up to 3 liters. I spoke to Dennis at [facility] and he said pt has [employer] Go oxygen system that will go up to 3 liters.His staff were at pt home last night to go over it. Order sent to [facility] for nebulizer machine with face shield. I notified pts sister Vaughan Basta and recommended she call Barbaraann Rondo to clarify how to increase flow on pt oxygen delivery system.

## 2015-08-02 NOTE — Progress Notes (Signed)
1220 Pt started Nebulizer treatment and  advised he was going to wait to finish reatment as treatment " was taking to long to finish".

## 2015-08-02 NOTE — Telephone Encounter (Signed)
"  Henry [name] order cxr for tomorrow or next week to see if fluid is re accumulating? "He is trying to stay on top of fluid buildup before it makes his sob worse.Note to [place].

## 2015-08-02 NOTE — Patient Instructions (Signed)
Nivolumab injection What is this medicine? NIVOLUMAB (nye VOL ue mab) is used to treat certain types of melanoma and lung cancer. This medicine may be used for other purposes; ask your health care provider or pharmacist if you have questions. COMMON BRAND NAME(S): Opdivo What should I tell my health care provider before I take this medicine? They need to know if you have any of these conditions: -eye disease, vision problems -history of pancreatitis -immune system problems -inflammatory bowel disease -kidney disease -liver disease -lung disease -lupus -myasthenia gravis -multiple sclerosis -organ transplant -stomach or intestine problems -thyroid disease -tingling of the fingers or toes, or other nerve disorder -an unusual or allergic reaction to nivolumab, other medicines, foods, dyes, or preservatives -pregnant or trying to get pregnant -breast-feeding How should I use this medicine? This medicine is for infusion into a vein. It is given by a health care professional in a hospital or clinic setting. A special MedGuide will be given to you before each treatment. Be sure to read this information carefully each time. Talk to your pediatrician regarding the use of this medicine in children. Special care may be needed. Overdosage: If you think you've taken too much of this medicine contact a poison control center or emergency room at once. Overdosage: If you think you have taken too much of this medicine contact a poison control center or emergency room at once. NOTE: This medicine is only for you. Do not share this medicine with others. What if I miss a dose? It is important not to miss your dose. Call your doctor or health care professional if you are unable to keep an appointment. What may interact with this medicine? Interactions have not been studied. This list may not describe all possible interactions. Give your health care provider a list of all the medicines, herbs,  non-prescription drugs, or dietary supplements you use. Also tell them if you smoke, drink alcohol, or use illegal drugs. Some items may interact with your medicine. What should I watch for while using this medicine? Tell your doctor or healthcare professional if your symptoms do not start to get better or if they get worse. Your condition will be monitored carefully while you are receiving this medicine. You may need blood work done while you are taking this medicine. What side effects may I notice from receiving this medicine? Side effects that you should report to your doctor or health care professional as soon as possible: -allergic reactions like skin rash, itching or hives, swelling of the face, lips, or tongue -black, tarry stools -bloody or watery diarrhea -changes in vision -chills -cough -depressed mood -eye pain -feeling anxious -fever -general ill feeling or flu-like symptoms -hair loss -loss of appetite -low blood counts - this medicine may decrease the number of white blood cells, red blood cells and platelets. You may be at increased risk for infections and bleeding -pain, tingling, numbness in the hands or feet -redness, blistering, peeling or loosening of the skin, including inside the mouth -red pinpoint spots on skin -signs of decreased platelets or bleeding - bruising, pinpoint red spots on the skin, black, tarry stools, blood in the urine -signs of decreased red blood cells - unusually weak or tired, feeling faint or lightheaded, falls -signs of infection - fever or chills, cough, sore throat, pain or trouble passing urine -signs and symptoms of a dangerous change in heartbeat or heart rhythm like chest pain; dizziness; fast or irregular heartbeat; palpitations; feeling faint or lightheaded, falls; breathing problems -signs   and symptoms of high blood sugar such as dizziness; dry mouth; dry skin; fruity breath; nausea; stomach pain; increased hunger or thirst; increased  urination -signs and symptoms of kidney injury like trouble passing urine or change in the amount of urine -signs and symptoms of liver injury like dark yellow or brown urine; general ill feeling or flu-like symptoms; light-colored stools; loss of appetite; nausea; right upper belly pain; unusually weak or tired; yellowing of the eyes or skin -signs and symptoms of increased potassium like muscle weakness; chest pain; or fast, irregular heartbeat -signs and symptoms of low potassium like muscle cramps or muscle pain; chest pain; dizziness; feeling faint or lightheaded, falls; palpitations; breathing problems; or fast, irregular heartbeat -swelling of the ankles, feet, hands -weight gainSide effects that usually do not require medical attention (report to your doctor or health care professional if they continue or are bothersome): -constipation -general ill feeling or flu-like symptoms -hair loss -loss of appetite -nausea, vomiting This list may not describe all possible side effects. Call your doctor for medical advice about side effects. You may report side effects to FDA at 1-800-FDA-1088. Where should I keep my medicine? This drug is given in a hospital or clinic and will not be stored at home. NOTE: This sheet is a summary. It may not cover all possible information. If you have questions about this medicine, talk to your doctor, pharmacist, or health care provider.  2015, Elsevier/Gold Standard. (2014-01-23 13:18:19)  

## 2015-08-03 ENCOUNTER — Ambulatory Visit: Payer: BLUE CROSS/BLUE SHIELD | Admitting: Internal Medicine

## 2015-08-03 ENCOUNTER — Telehealth: Payer: Self-pay | Admitting: Medical Oncology

## 2015-08-03 NOTE — Telephone Encounter (Signed)
He feels like his symptoms ," congestion, more winded as I talk and don't seem to be able to walk at a distance". I instructed pt to moniter symptoms and to go to ED for worsening SOB . He stated he is taking nebulizer txs and inhalers as ordered and his oxygen is at 3 liters [place]. He is using the nebulizer equipment from [facility] .

## 2015-08-03 NOTE — Telephone Encounter (Signed)
-----   Message from Curt Bears, MD sent at 08/02/2015  4:59 PM EDT ----- Regarding: RE: cxr CXR will be ordered based on symptoms. ----- Message -----    From: Ardeen Garland, RN    Sent: 08/02/2015   3:56 PM      To: Curt Bears, MD Subject: cxr                                            ' [name] order cxr for tomorrow or next week to see if fluid is re accumulating? "  He is trying to stay on top of fluid buildup before it makes his sob worse.

## 2015-08-04 LAB — CULTURE, BLOOD (ROUTINE X 2)
CULTURE: NO GROWTH
CULTURE: NO GROWTH

## 2015-08-04 LAB — CULTURE, BODY FLUID-BOTTLE: CULTURE: NO GROWTH

## 2015-08-04 LAB — CULTURE, BODY FLUID W GRAM STAIN -BOTTLE

## 2015-08-06 LAB — OTHER BODY FLUID CHEMISTRY: MISCELLANEOUS TEST RESULTS: 36

## 2015-08-07 ENCOUNTER — Other Ambulatory Visit: Payer: Self-pay | Admitting: Medical Oncology

## 2015-08-07 ENCOUNTER — Telehealth: Payer: Self-pay | Admitting: Medical Oncology

## 2015-08-07 NOTE — Telephone Encounter (Signed)
Three issues-  1."Why am I having labs done at Dr Alroy Dust?" I left message for Dr Mickle Plumb office to call back about labs.  2.what about checking on my fluid level ' It feels like my abdomen is getting tighter". 3. I thought I was supposed to have 3 procedures done and I only had two. I told him he just had two based on diagnostic tests and MD decision.

## 2015-08-07 NOTE — Telephone Encounter (Signed)
Amy called back from Dr. Alroy Dust and she will let us know if labs can wait until 08/16/15

## 2015-08-08 ENCOUNTER — Telehealth: Payer: Self-pay | Admitting: *Deleted

## 2015-08-08 ENCOUNTER — Telehealth: Payer: Self-pay | Admitting: Medical Oncology

## 2015-08-08 DIAGNOSIS — J90 Pleural effusion, not elsewhere classified: Secondary | ICD-10-CM

## 2015-08-08 DIAGNOSIS — K121 Other forms of stomatitis: Secondary | ICD-10-CM

## 2015-08-08 MED ORDER — MAGIC MOUTHWASH: 5.0000 mL | Freq: Four times a day (QID) | ORAL | Status: DC

## 2015-08-08 NOTE — Telephone Encounter (Signed)
Oncology Nurse Navigator Documentation  Oncology Nurse Navigator Flowsheets 08/08/2015  Navigator Encounter Type Telephone/ called patient and gave him appt to be seen by Dr. Roxan Hockey tomorrow.  He asked that I call back in an hour with appt  Patient Visit Type Follow-up  Interventions Coordination of Care  Coordination of Care MD Appointments  Time Spent with Patient 15

## 2015-08-08 NOTE — Telephone Encounter (Signed)
Louann called back and said their office will draw labs. I notified pt. Pt asking about how long it takes to know if nivolumab is working? He would like to have a procedure done "like when I  was first diagnosed"   to drain fluid to get all lung capacity back.    Per Julien Nordmann pt referred to Dr Roxan Hockey for pluerix catheter . Next scan in 9 weeks to see how effective is treatment.- Pt notified. Mouth started burning , sore tongue - he is using salt water rinses.

## 2015-08-08 NOTE — Telephone Encounter (Signed)
Per Julien Nordmann pt may have magic mouthwash-I left a voice mail message for pt to pick up rx

## 2015-08-08 NOTE — Telephone Encounter (Signed)
Called patient back and left him a vm message regarding appt time and place.  He will be seen 08/09/15 here at [facility] at 4:30 with Dr. Roxan Hockey arrive at 4:15.

## 2015-08-09 ENCOUNTER — Encounter: Payer: Self-pay | Admitting: *Deleted

## 2015-08-09 ENCOUNTER — Other Ambulatory Visit: Payer: Self-pay | Admitting: Thoracic Surgery (Cardiothoracic Vascular Surgery)

## 2015-08-09 ENCOUNTER — Telehealth: Payer: Self-pay | Admitting: *Deleted

## 2015-08-09 ENCOUNTER — Ambulatory Visit (INDEPENDENT_AMBULATORY_CARE_PROVIDER_SITE_OTHER): Payer: BLUE CROSS/BLUE SHIELD | Admitting: Thoracic Surgery (Cardiothoracic Vascular Surgery)

## 2015-08-09 DIAGNOSIS — R188 Other ascites: Secondary | ICD-10-CM

## 2015-08-09 DIAGNOSIS — J910 Malignant pleural effusion: Secondary | ICD-10-CM

## 2015-08-09 DIAGNOSIS — R180 Malignant ascites: Secondary | ICD-10-CM | POA: Diagnosis not present

## 2015-08-09 DIAGNOSIS — C3491 Malignant neoplasm of unspecified part of right bronchus or lung: Secondary | ICD-10-CM | POA: Diagnosis not present

## 2015-08-09 NOTE — Telephone Encounter (Signed)
Oncology Nurse Navigator Documentation  Oncology Nurse Navigator Flowsheets 08/09/2015  Navigator Encounter Type Treatment/per Dr. Leonarda Salon request, I called patient to schedule at an earlier time today.  Patient verbalized understanding of appt time arrive at 2:45 here at [facility]  Patient Visit Type Follow-up  Treatment Phase Treatment  Barriers/Navigation Needs -  Education -  Interventions Coordination of Care  Coordination of Care MD Appointments  Time Spent with Patient 15

## 2015-08-09 NOTE — Progress Notes (Signed)
[place] [address]       , [id number]             [phone]       HPI:  Henry [name] is a 64 yo man with stage IV lung cancer who has a malignant left pleural effusion and ascites. He recently had a left thoracentesis and paracentesis with temporary relief but now presents again with shortness of breath and abdominal distention.  I did a right VATS on him a year ago for what turned out to be a malignant right pleural effusion.  He is currently on nivolumab  He complains of poor appetite, early satiety, abdominal distention, weight loss, peripheral edema, in addition to shortness of breath.  Past Medical History  Diagnosis Date  . Hypertension   . GERD (gastroesophageal reflux disease)   . Hemochromatosis   . Hx of adenomatous colonic polyps   . Umbilical hernia     none since weight loss  . Diverticulosis   . Lumbar spondylosis     ankle  . Pleural effusion on right 12/16/13    per chest xray  . Shortness of breath     with  exertion  . Chronic pancreatitis   . IBS (irritable bowel syndrome)   . Lung cancer 06/2014  . 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 Pleural effusion 07-11-15    Past Surgical History  Procedure Laterality Date  . Lumbar laminectomy  1983  . Knee arthrsoscopy  2003    left  . Tonsillectomy  1962  . Eus N/A 04/07/2013    Procedure: UPPER ENDOSCOPIC ULTRASOUND (EUS) LINEAR;  Surgeon: [name], MD;  Location: [facility];  Service: Endoscopy;  Laterality: N/A;  . Video assisted thoracoscopy (vats)/decortication Right 06/22/2014    Procedure: VIDEO ASSISTED THORACOSCOPY (VATS)/DECORTICATION;  Surgeon: [name], MD;  Location: [place];  Service: Thoracic;  Laterality: Right;  . Pleural effusion drainage Right 06/22/2014    Procedure: DRAINAGE OF PLEURAL EFFUSION;  Surgeon: [name], MD;  Location: [place];  Service: Thoracic;  Laterality: Right;  . Eus N/A 07/20/2014    Procedure: UPPER ENDOSCOPIC  ULTRASOUND (EUS) LINEAR;  Surgeon: [name], MD;  Location: [facility];  Service: Endoscopy;  Laterality: N/A;      Current Outpatient Prescriptions  Medication Sig Dispense Refill  . albuterol (PROVENTIL) (2.5 MG/3ML) 0.083% nebulizer solution Take 3 mLs (2.5 mg total) by nebulization every 6 (six) hours as needed for wheezing or shortness of breath. 75 mL 12  . dextromethorphan-guaiFENesin (MUCINEX DM) 30-600 MG per 12 hr tablet Take 1 tablet by mouth 2 (two) times daily as needed for cough. 30 tablet 0  . docusate sodium (COLACE) 100 MG capsule Take 1 capsule (100 mg total) by mouth 2 (two) times daily. 10 capsule 0  . DULERA 200-5 MCG/ACT AERO Inhale 1 puff into the lungs 2 (two) times daily.  10  . EPIPEN 2-PAK 0.3 MG/0.3ML SOAJ injection See admin instructions.  1  . feeding supplement, ENSURE ENLIVE, (ENSURE ENLIVE) LIQD Take 237 mLs by mouth 2 (two) times daily between meals. 681 mL 12  . folic acid (FOLVITE) 1 MG tablet Take 1 tablet (1 mg total) by mouth daily. 30 tablet 3  . furosemide (LASIX) 40 MG tablet Take 40 mg by mouth every morning.   11  . KLOR-CON M10 10 MEQ tablet Take 10 mEq by mouth 2 (two) times daily.  4  . magic mouthwash  SOLN Take 5 mLs by mouth 4 (four) times daily. Take 5 mls swish and spit  Qid prn sore mouth 240 mL 0  . morphine (MSIR) 15 MG tablet Take 1 tablet (15 mg total) by mouth every 4 (four) hours as needed for severe pain. 60 tablet 0  . oxyCODONE (OXY IR/ROXICODONE) 5 MG immediate release tablet Take 1 tablet (5 mg total) by mouth every 4 (four) hours as needed for moderate pain. 60 tablet 0  . polyethylene glycol (MIRALAX / GLYCOLAX) packet Take 17 g by mouth daily as needed for moderate constipation. 14 each 0  . predniSONE (DELTASONE) 10 MG tablet Take 1 tablet (10 mg total) by mouth daily with breakfast. 30 tablet 1  . PROAIR HFA 108 (90 BASE) MCG/ACT inhaler Inhale 1 each into the lungs 2 (two) times daily.  3  . tamsulosin (FLOMAX) 0.4 MG  CAPS capsule Take 0.4 mg by mouth daily.  11   No current facility-administered medications for this visit.    Physical Exam There were no vitals taken for this visit. Ill appearing 64 yo man in mild distress with increased WOB HEENT- [place] O2 in place Lungs diminished BS at bases L>R Cardiac RR normal S1, S2 Abdomen- markedly distended, not tympanitic Extremities- 2+ edema  Diagnostic Tests: CXR reviewed- moderate residual left effusion after thoracentesis  Impression: 64 yo man with a malignant left pleural effusion. He has a significant effusion even after thoracentesis a week ago. He would benefit from a pleural catheter to drain the effusion on a regular basis for palliative purposes. There is a slight possibility this could also help his ascites.  I discussed the procedure with him. It will be done as an outpatient. He will have an indwelling catheter that will need care postop. I informed him of the indications, risks, benefits and alternatives. He understands the risks include but are not limited to bleeding, infection, catheter malposition, catheter occlusion, as well as the possibility of other unforeseeable complications.  He agrees to proceed  Ascites- he has severe symptomatic ascites. He would benefit from paracentesis. We will see if we can arrange for that.  Plan: Left pleural catheter placement next week. My office will call him to schedule.  [name], MD [facility] [phone]

## 2015-08-09 NOTE — Progress Notes (Signed)
Oncology Nurse Navigator Documentation  Oncology Nurse Navigator Flowsheets 08/09/2015  Navigator Encounter Type Treatment/per Dr. Roxan Hockey I helped arrange peri US biopsy tomorrow. I called radiology and spoke with [facility] and she stated ok for [place] to be tomorrow at 11:00. I called cental scheduling they scheduled.  I spoke with patient to update about appt. He verbalized understanding   Patient Visit Type Follow-up  Treatment Phase Treatment  Barriers/Navigation Needs -  Education -  Interventions Coordination of Care  Coordination of Care MD Appointments  Time Spent with Patient 30

## 2015-08-09 NOTE — Progress Notes (Signed)
[place] Psychosocial Distress Screening Clinical Social Work  Clinical Social Work was referred by distress screening protocol.  The patient scored a 8 on the Psychosocial Distress Thermometer which indicates severe distress. Clinical Social Worker reviewed chart to assess for distress and other psychosocial needs. Through chart review, it appears this medical concern was addressed by the medical team appropriately.   ONCBCN DISTRESS SCREENING 07/26/2015  Distress experienced in past week (1-10) 8  Physical Problem type Breathing  Physician notified of physical symptoms Yes    Clinical Social Worker follow up needed: No.  If yes, follow up plan:  Loren Racer, [place]  [facility] Phone: [phone] Fax: [phone]

## 2015-08-10 ENCOUNTER — Ambulatory Visit (HOSPITAL_COMMUNITY)
Admission: RE | Admit: 2015-08-10 | Discharge: 2015-08-10 | Disposition: A | Discharge status: Home / Self Care | Payer: BLUE CROSS/BLUE SHIELD | Source: Ambulatory Visit | Attending: Thoracic Surgery (Cardiothoracic Vascular Surgery) | Admitting: Thoracic Surgery (Cardiothoracic Vascular Surgery)

## 2015-08-10 ENCOUNTER — Encounter (HOSPITAL_COMMUNITY): Payer: Self-pay | Admitting: *Deleted

## 2015-08-10 ENCOUNTER — Other Ambulatory Visit: Payer: Self-pay | Admitting: *Deleted

## 2015-08-10 DIAGNOSIS — J90 Pleural effusion, not elsewhere classified: Secondary | ICD-10-CM

## 2015-08-10 DIAGNOSIS — R188 Other ascites: Secondary | ICD-10-CM

## 2015-08-10 HISTORY — PX: US THORA/PARACENTESIS: HXRAD580

## 2015-08-10 MED ORDER — SODIUM CHLORIDE 0.9 % IV SOLN: INTRAVENOUS | Status: DC

## 2015-08-10 NOTE — Progress Notes (Signed)
i spoke with Gardiner Barefoot, patients sister who was with Mr Peral.  Mr Orlick was short of breath and oxygen was running low. Ms Audelia Acton said that [place] was coming with more oxygen. I encouraged to call 911, if situation did not resolve.  Ms Audelia Acton stated that Mr Witzke was tired, had a paracentesis this am.  Ms Audelia Acton stated that she will bring medication list on Monday.  Ms Audelia Acton reported that she is arranging 24 hour care for Mr Rengel for a few days, so that someone will be with him after surgery.

## 2015-08-10 NOTE — Procedures (Signed)
Successful US guided paracentesis from RLQ.  Yielded 4.2 liters of blood tinged fluid.  No immediate complications.  Pt tolerated well.   Specimen was not sent for labs.  WENDY S BLAIR PA-C 08/10/2015 1:10 PM

## 2015-08-13 ENCOUNTER — Ambulatory Visit (HOSPITAL_COMMUNITY): Payer: BLUE CROSS/BLUE SHIELD | Admitting: Anesthesiology

## 2015-08-13 ENCOUNTER — Encounter (HOSPITAL_COMMUNITY): Payer: BLUE CROSS/BLUE SHIELD

## 2015-08-13 ENCOUNTER — Ambulatory Visit (HOSPITAL_BASED_OUTPATIENT_CLINIC_OR_DEPARTMENT_OTHER): Payer: BLUE CROSS/BLUE SHIELD

## 2015-08-13 ENCOUNTER — Ambulatory Visit (HOSPITAL_COMMUNITY): Payer: BLUE CROSS/BLUE SHIELD

## 2015-08-13 ENCOUNTER — Encounter (HOSPITAL_COMMUNITY): Payer: Self-pay | Admitting: Certified Registered Nurse Anesthetist

## 2015-08-13 ENCOUNTER — Ambulatory Visit (HOSPITAL_COMMUNITY)
Admission: RE | Admit: 2015-08-13 | Discharge: 2015-08-13 | Disposition: A | Discharge status: Home / Self Care | Payer: BLUE CROSS/BLUE SHIELD | Source: Ambulatory Visit | Attending: Thoracic Surgery (Cardiothoracic Vascular Surgery) | Admitting: Thoracic Surgery (Cardiothoracic Vascular Surgery)

## 2015-08-13 ENCOUNTER — Encounter (HOSPITAL_COMMUNITY)
Admission: RE | Disposition: A | Discharge status: Home / Self Care | Payer: Self-pay | Source: Ambulatory Visit | Attending: Thoracic Surgery (Cardiothoracic Vascular Surgery)

## 2015-08-13 DIAGNOSIS — I10 Essential (primary) hypertension: Secondary | ICD-10-CM | POA: Diagnosis not present

## 2015-08-13 DIAGNOSIS — Z85850 Personal history of malignant neoplasm of thyroid: Secondary | ICD-10-CM | POA: Insufficient documentation

## 2015-08-13 DIAGNOSIS — R609 Edema, unspecified: Secondary | ICD-10-CM

## 2015-08-13 DIAGNOSIS — K589 Irritable bowel syndrome without diarrhea: Secondary | ICD-10-CM | POA: Insufficient documentation

## 2015-08-13 DIAGNOSIS — C3492 Malignant neoplasm of unspecified part of left bronchus or lung: Secondary | ICD-10-CM | POA: Insufficient documentation

## 2015-08-13 DIAGNOSIS — R140 Abdominal distension (gaseous): Secondary | ICD-10-CM | POA: Diagnosis not present

## 2015-08-13 DIAGNOSIS — K219 Gastro-esophageal reflux disease without esophagitis: Secondary | ICD-10-CM | POA: Insufficient documentation

## 2015-08-13 DIAGNOSIS — M1990 Unspecified osteoarthritis, unspecified site: Secondary | ICD-10-CM | POA: Diagnosis not present

## 2015-08-13 DIAGNOSIS — J910 Malignant pleural effusion: Secondary | ICD-10-CM | POA: Diagnosis not present

## 2015-08-13 DIAGNOSIS — Z9981 Dependence on supplemental oxygen: Secondary | ICD-10-CM | POA: Diagnosis not present

## 2015-08-13 DIAGNOSIS — J449 Chronic obstructive pulmonary disease, unspecified: Secondary | ICD-10-CM | POA: Insufficient documentation

## 2015-08-13 DIAGNOSIS — R6881 Early satiety: Secondary | ICD-10-CM | POA: Insufficient documentation

## 2015-08-13 DIAGNOSIS — Z87891 Personal history of nicotine dependence: Secondary | ICD-10-CM | POA: Insufficient documentation

## 2015-08-13 DIAGNOSIS — R188 Other ascites: Secondary | ICD-10-CM | POA: Diagnosis not present

## 2015-08-13 DIAGNOSIS — Z86010 Personal history of colonic polyps: Secondary | ICD-10-CM | POA: Insufficient documentation

## 2015-08-13 DIAGNOSIS — I509 Heart failure, unspecified: Secondary | ICD-10-CM | POA: Diagnosis not present

## 2015-08-13 DIAGNOSIS — J90 Pleural effusion, not elsewhere classified: Secondary | ICD-10-CM

## 2015-08-13 HISTORY — DX: Heart failure, unspecified: I50.9

## 2015-08-13 HISTORY — DX: Dependence on supplemental oxygen: Z99.81

## 2015-08-13 HISTORY — PX: CHEST TUBE INSERTION: SHX231

## 2015-08-13 HISTORY — DX: Other ascites: R18.8

## 2015-08-13 LAB — CBC
HCT: 41.5 % (ref 39.0–52.0)
Hemoglobin: 14.1 g/dL (ref 13.0–17.0)
MCH: 34 pg (ref 26.0–34.0)
MCHC: 34 g/dL (ref 30.0–36.0)
MCV: 100 fL (ref 78.0–100.0)
PLATELETS: 300 10*3/uL (ref 150–400)
RBC: 4.15 MIL/uL — ABNORMAL LOW (ref 4.22–5.81)
RDW: 14.6 % (ref 11.5–15.5)
WBC: 12.4 10*3/uL — ABNORMAL HIGH (ref 4.0–10.5)

## 2015-08-13 LAB — COMPREHENSIVE METABOLIC PANEL
ALT: 15 U/L — ABNORMAL LOW (ref 17–63)
ANION GAP: 9 (ref 5–15)
AST: 23 U/L (ref 15–41)
Albumin: 2.7 g/dL — ABNORMAL LOW (ref 3.5–5.0)
Alkaline Phosphatase: 75 U/L (ref 38–126)
BUN: 21 mg/dL — ABNORMAL HIGH (ref 6–20)
CHLORIDE: 92 mmol/L — AB (ref 101–111)
CO2: 36 mmol/L — AB (ref 22–32)
CREATININE: 0.96 mg/dL (ref 0.61–1.24)
Calcium: 9.3 mg/dL (ref 8.9–10.3)
Glucose, Bld: 115 mg/dL — ABNORMAL HIGH (ref 65–99)
POTASSIUM: 3.6 mmol/L (ref 3.5–5.1)
SODIUM: 137 mmol/L (ref 135–145)
Total Bilirubin: 0.6 mg/dL (ref 0.3–1.2)
Total Protein: 6.2 g/dL — ABNORMAL LOW (ref 6.5–8.1)

## 2015-08-13 LAB — PROTIME-INR
INR: 1.04 (ref 0.00–1.49)
PROTHROMBIN TIME: 13.8 s (ref 11.6–15.2)

## 2015-08-13 LAB — APTT: aPTT: 30 seconds (ref 24–37)

## 2015-08-13 SURGERY — INSERTION, PLEURAL DRAINAGE CATHETER
Anesthesia: Monitor Anesthesia Care | Site: Chest | Laterality: Left

## 2015-08-13 MED ORDER — FENTANYL CITRATE (PF) 250 MCG/5ML IJ SOLN
INTRAMUSCULAR | Status: AC
  Filled 2015-08-13: qty 5

## 2015-08-13 MED ORDER — MIDAZOLAM HCL 2 MG/2ML IJ SOLN
INTRAMUSCULAR | Status: AC
  Filled 2015-08-13: qty 4

## 2015-08-13 MED ORDER — OXYCODONE HCL 5 MG PO TABS: 5.0000 mg | ORAL_TABLET | ORAL | Status: AC | PRN

## 2015-08-13 MED ORDER — 0.9 % SODIUM CHLORIDE (POUR BTL) OPTIME
TOPICAL | Status: DC | PRN
  Administered 2015-08-13: 2000 mL

## 2015-08-13 MED ORDER — ONDANSETRON HCL 4 MG/2ML IJ SOLN
INTRAMUSCULAR | Status: DC | PRN
  Administered 2015-08-13: 4 mg via INTRAVENOUS

## 2015-08-13 MED ORDER — MIDAZOLAM HCL 5 MG/5ML IJ SOLN
INTRAMUSCULAR | Status: DC | PRN
  Administered 2015-08-13: 0.5 mg via INTRAVENOUS

## 2015-08-13 MED ORDER — DEXTROSE 5 % IV SOLN
1.5000 g | INTRAVENOUS | Status: AC
  Administered 2015-08-13: 1.5 g via INTRAVENOUS
  Filled 2015-08-13: qty 1.5

## 2015-08-13 MED ORDER — PROMETHAZINE HCL 25 MG/ML IJ SOLN: 6.2500 mg | INTRAMUSCULAR | Status: DC | PRN

## 2015-08-13 MED ORDER — FENTANYL CITRATE (PF) 100 MCG/2ML IJ SOLN
INTRAMUSCULAR | Status: DC | PRN
  Administered 2015-08-13 (×2): 25 ug via INTRAVENOUS

## 2015-08-13 MED ORDER — LIDOCAINE HCL (PF) 1 % IJ SOLN
INTRAMUSCULAR | Status: AC
  Filled 2015-08-13: qty 30

## 2015-08-13 MED ORDER — HYDROMORPHONE HCL 1 MG/ML IJ SOLN: 0.2500 mg | INTRAMUSCULAR | Status: DC | PRN

## 2015-08-13 MED ORDER — OXYCODONE HCL 5 MG PO TABS: 5.0000 mg | ORAL_TABLET | ORAL | Status: DC | PRN

## 2015-08-13 MED ORDER — LACTATED RINGERS IV SOLN
INTRAVENOUS | Status: DC | PRN
  Administered 2015-08-13: 11:00:00 via INTRAVENOUS

## 2015-08-13 MED ORDER — MEPERIDINE HCL 25 MG/ML IJ SOLN: 6.2500 mg | INTRAMUSCULAR | Status: DC | PRN

## 2015-08-13 MED ORDER — LIDOCAINE HCL 1 % IJ SOLN
INTRAMUSCULAR | Status: DC | PRN
  Administered 2015-08-13: 30 mL via INTRADERMAL

## 2015-08-13 MED ORDER — LACTATED RINGERS IV SOLN
INTRAVENOUS | Status: DC
  Administered 2015-08-13: 11:00:00 via INTRAVENOUS

## 2015-08-13 SURGICAL SUPPLY — 28 items
ADH SKN CLS APL DERMABOND .7 (GAUZE/BANDAGES/DRESSINGS) ×1
CANISTER SUCTION 2500CC (MISCELLANEOUS) ×3 IMPLANT
COVER SURGICAL LIGHT HANDLE (MISCELLANEOUS) ×3 IMPLANT
DERMABOND ADVANCED (GAUZE/BANDAGES/DRESSINGS) ×2
DERMABOND ADVANCED .7 DNX12 (GAUZE/BANDAGES/DRESSINGS) ×1 IMPLANT
DRAPE C-ARM 42X72 X-RAY (DRAPES) ×3 IMPLANT
DRAPE LAPAROSCOPIC ABDOMINAL (DRAPES) ×3 IMPLANT
GLOVE SURG SIGNA 7.5 PF LTX (GLOVE) ×3 IMPLANT
GOWN STRL REUS W/ TWL LRG LVL3 (GOWN DISPOSABLE) ×1 IMPLANT
GOWN STRL REUS W/ TWL XL LVL3 (GOWN DISPOSABLE) ×1 IMPLANT
GOWN STRL REUS W/TWL LRG LVL3 (GOWN DISPOSABLE) ×3
GOWN STRL REUS W/TWL XL LVL3 (GOWN DISPOSABLE) ×3
KIT BASIN OR (CUSTOM PROCEDURE TRAY) ×3 IMPLANT
KIT PLEURX DRAIN CATH 1000ML (MISCELLANEOUS) ×3 IMPLANT
KIT PLEURX DRAIN CATH 15.5FR (DRAIN) ×3 IMPLANT
KIT ROOM TURNOVER OR (KITS) ×3 IMPLANT
NS IRRIG 1000ML POUR BTL (IV SOLUTION) ×3 IMPLANT
PACK GENERAL/GYN (CUSTOM PROCEDURE TRAY) ×3 IMPLANT
PAD ARMBOARD 7.5X6 YLW CONV (MISCELLANEOUS) ×6 IMPLANT
SET DRAINAGE LINE (MISCELLANEOUS) IMPLANT
SPONGE GAUZE 4X4 12PLY STER LF (GAUZE/BANDAGES/DRESSINGS) ×2 IMPLANT
SUT ETHILON 3 0 FSL (SUTURE) ×5 IMPLANT
SUT VIC AB 3-0 X1 27 (SUTURE) ×7 IMPLANT
TAPE CLOTH SURG 4X10 WHT LF (GAUZE/BANDAGES/DRESSINGS) ×2 IMPLANT
TOWEL OR 17X24 6PK STRL BLUE (TOWEL DISPOSABLE) ×3 IMPLANT
TOWEL OR 17X26 10 PK STRL BLUE (TOWEL DISPOSABLE) ×3 IMPLANT
VALVE REPLACEMENT CAP (MISCELLANEOUS) IMPLANT
WATER STERILE IRR 1000ML POUR (IV SOLUTION) ×3 IMPLANT

## 2015-08-13 NOTE — Anesthesia Postprocedure Evaluation (Signed)
Anesthesia Post Note  Patient: Henry [name]  Procedure(s) Performed: Procedure(s) (LRB): INSERTION PLEURAL DRAINAGE CATHETER (Left)  Anesthesia type: MAC  Patient location: PACU  Post pain: Pain level controlled  Post assessment: Post-op Vital signs reviewed  Last Vitals: BP 136/95 mmHg  Pulse 97  Temp(Src) 36.8 C (Oral)  Resp 24  Ht 6' (1.829 m)  Wt 148 lb (67.132 kg)  BMI 20.07 kg/m2  SpO2 95%  Post vital signs: Reviewed  Level of consciousness: awake  Complications: No apparent anesthesia complications

## 2015-08-13 NOTE — Progress Notes (Signed)
Per [facility] PA, results for venous dopplers are negative and is now ready fro discharge. PAtient has  Slept most of his entire pacu stay unless interrupted, has been stable/baseline.

## 2015-08-13 NOTE — Progress Notes (Signed)
VASCULAR LAB PRELIMINARY  PRELIMINARY  PRELIMINARY  PRELIMINARY  Bilateral lower extremity venous duplex completed.    Preliminary report:  There is no DVT or SVT noted in the bilateral lower extremities.   KANADY, CANDACE, RVT 08/13/2015, 2:35 PM

## 2015-08-13 NOTE — Discharge Instructions (Signed)
Do not drive or engage in heavy physical activity for 24 hours  A home health nurse will be arranged for catheter care and drainage  Call [phone] if you develop chest pain, shortness of breath, fever > 101F or notice drainage or redness around catheter  My office will contact you with a follow up appointment

## 2015-08-13 NOTE — Transfer of Care (Signed)
Immediate Anesthesia Transfer of Care Note  Patient: Henry [name]  Procedure(s) Performed: Procedure(s): INSERTION PLEURAL DRAINAGE CATHETER (Left)  Patient Location: PACU  Anesthesia Type:MAC  Level of Consciousness: awake, alert  and oriented  Airway & Oxygen Therapy: Patient Spontanous Breathing and Patient connected to nasal cannula oxygen  Post-op Assessment: Report given to RN, Post -op Vital signs reviewed and stable and Patient moving all extremities X 4  Post vital signs: Reviewed and stable  Last Vitals:  Filed Vitals:   08/13/15 1224  BP: 136/95  Pulse: 96  Temp: 36.8 C  Resp: 23    Complications: No apparent anesthesia complications

## 2015-08-13 NOTE — Anesthesia Procedure Notes (Signed)
Procedure Name: MAC Date/Time: 08/13/2015 11:40 AM Performed by: Garrison Columbus T Pre-anesthesia Checklist: Patient identified, Emergency Drugs available, Suction available and Patient being monitored Patient Re-evaluated:Patient Re-evaluated prior to inductionOxygen Delivery Method: Simple face mask Preoxygenation: Pre-oxygenation with 100% oxygen Intubation Type: IV induction Placement Confirmation: positive ETCO2 and breath sounds checked- equal and bilateral Dental Injury: Teeth and Oropharynx as per pre-operative assessment

## 2015-08-13 NOTE — Interval H&P Note (Signed)
History and Physical Interval Note:  08/13/2015 10:35 AM  Henry [name]  has presented today for surgery, with the diagnosis of LEFT PLEURAL EFFUSION  The various methods of treatment have been discussed with the patient and family. After consideration of risks, benefits and other options for treatment, the patient has consented to  Procedure(s): INSERTION PLEURAL DRAINAGE CATHETER (Left) as a surgical intervention .  The patient's history has been reviewed, patient examined, no change in status, stable for surgery.  I have reviewed the patient's chart and labs.  Questions were answered to the patient's satisfaction.     [name]

## 2015-08-13 NOTE — Anesthesia Preprocedure Evaluation (Addendum)
Anesthesia Evaluation  Patient identified by MRN, date of birth, ID band Patient awake    Reviewed: Allergy & Precautions, NPO status , Patient's Chart, lab work & pertinent test results  Airway Mallampati: II       Dental  (+) Dental Advisory Given   Pulmonary shortness of breath, COPD,  COPD inhaler and oxygen dependent, former smoker,     + decreased breath sounds      Cardiovascular hypertension, Pt. on medications +CHF   Rhythm:Regular Rate:Normal     Neuro/Psych    GI/Hepatic GERD  Medicated,  Endo/Other    Renal/GU      Musculoskeletal  (+) Arthritis ,   Abdominal   Peds  Hematology   Anesthesia Other Findings   Reproductive/Obstetrics                          Anesthesia Physical Anesthesia Plan  ASA: III  Anesthesia Plan: MAC   Post-op Pain Management:    Induction: Intravenous  Airway Management Planned: Simple Face Mask and Natural Airway  Additional Equipment:   Intra-op Plan:   Post-operative Plan:   Informed Consent: I have reviewed the patients History and Physical, chart, labs and discussed the procedure including the risks, benefits and alternatives for the proposed anesthesia with the patient or authorized representative who has indicated his/her understanding and acceptance.   Dental advisory given  Plan Discussed with: Anesthesiologist, Surgeon and CRNA  Anesthesia Plan Comments:       Anesthesia Quick Evaluation                                  Anesthesia Evaluation  Patient identified by MRN, date of birth, ID band Patient awake    Reviewed: Allergy & Precautions, H&P , NPO status , Patient's Chart, lab work & pertinent test results  Airway Mallampati: II TM Distance: >3 FB Neck ROM: Full    Dental  (+) Dental Advisory Given,    Pulmonary shortness of breath and with exertion, Current Smoker,  SOB intermittent. breath sounds clear  to auscultation  Pulmonary exam normal       Cardiovascular hypertension, Pt. on medications Rhythm:Regular Rate:Normal  ECG normal.  Office visit Dr. Roxan Hockey 07-18-14 reviewed. Adenocarcinoma lung.   Neuro/Psych negative neurological ROS  negative psych ROS   GI/Hepatic Neg liver ROS, GERD-  ,  Endo/Other  negative endocrine ROS  Renal/GU negative Renal ROS  negative genitourinary   Musculoskeletal  (+) Arthritis -,   Abdominal   Peds negative pediatric ROS (+)  Hematology negative hematology ROS (+)   Anesthesia Other Findings   Reproductive/Obstetrics negative OB ROS                       Anesthesia Physical Anesthesia Plan  ASA: III  Anesthesia Plan: MAC   Post-op Pain Management:    Induction: Intravenous  Airway Management Planned:   Additional Equipment:   Intra-op Plan:   Post-operative Plan: Extubation in OR  Informed Consent: I have reviewed the patients History and Physical, chart, labs and discussed the procedure including the risks, benefits and alternatives for the proposed anesthesia with the patient or authorized representative who has indicated his/her understanding and acceptance.   Dental advisory given  Plan Discussed with: CRNA  Anesthesia Plan Comments:        Anesthesia Quick Evaluation  Anesthesia Evaluation  Patient identified by MRN, date of birth, ID band Patient awake    Reviewed: Allergy & Precautions, H&P , NPO status , Patient's Chart, lab work & pertinent test results  Airway Mallampati: II      Dental   Pulmonary shortness of breath and with exertion, Current Smoker,          Cardiovascular hypertension,     Neuro/Psych    GI/Hepatic Neg liver ROS, GERD-  ,  Endo/Other    Renal/GU negative Renal ROS     Musculoskeletal   Abdominal   Peds  Hematology   Anesthesia Other Findings   Reproductive/Obstetrics                           Anesthesia Physical Anesthesia Plan  ASA: III  Anesthesia Plan: General   Post-op Pain Management:    Induction: Intravenous  Airway Management Planned: Double Lumen EBT  Additional Equipment: Arterial line and CVP  Intra-op Plan:   Post-operative Plan: Possible Post-op intubation/ventilation  Informed Consent: I have reviewed the patients History and Physical, chart, labs and discussed the procedure including the risks, benefits and alternatives for the proposed anesthesia with the patient or authorized representative who has indicated his/her understanding and acceptance.   Dental advisory given  Plan Discussed with: Anesthesiologist, CRNA and Surgeon  Anesthesia Plan Comments:        Anesthesia Quick Evaluation                                   Anesthesia Evaluation  Patient identified by MRN, date of birth, ID band Patient awake    Reviewed: Allergy & Precautions, H&P , NPO status , Patient's Chart, lab work & pertinent test results  Airway Mallampati: II TM Distance: >3 FB Neck ROM: Full    Dental no notable dental hx.    Pulmonary Current Smoker,  80 pk yr hx breath sounds clear to auscultation  Pulmonary exam normal       Cardiovascular hypertension, Pt. on medications Rhythm:Regular Rate:Normal     Neuro/Psych negative neurological ROS  negative psych ROS   GI/Hepatic negative GI ROS, Neg liver ROS, GERD-  Medicated,  Endo/Other  negative endocrine ROS  Renal/GU negative Renal ROS  negative genitourinary   Musculoskeletal negative musculoskeletal ROS (+)   Abdominal   Peds negative pediatric ROS (+)  Hematology negative hematology ROS (+)   Anesthesia Other Findings   Reproductive/Obstetrics negative OB ROS                           Anesthesia Physical Anesthesia Plan  ASA: III  Anesthesia Plan: MAC   Post-op Pain Management:    Induction:  Intravenous  Airway Management Planned: Nasal Cannula  Additional Equipment:   Intra-op Plan:   Post-operative Plan:   Informed Consent: I have reviewed the patients History and Physical, chart, labs and discussed the procedure including the risks, benefits and alternatives for the proposed anesthesia with the patient or authorized representative who has indicated his/her understanding and acceptance.   Dental advisory given  Plan Discussed with: CRNA and Surgeon  Anesthesia Plan Comments:         Anesthesia Quick Evaluation

## 2015-08-13 NOTE — H&P (View-Only) (Signed)
[address]       [place] [address]             [phone]       HPI:  Henry [name] is a 64 yo man with stage IV lung cancer who has a malignant left pleural effusion and ascites. He recently had a left thoracentesis and paracentesis with temporary relief but now presents again with shortness of breath and abdominal distention.  I did a right VATS on him a year ago for what turned out to be a malignant right pleural effusion.  He is currently on nivolumab  He complains of poor appetite, early satiety, abdominal distention, weight loss, peripheral edema, in addition to shortness of breath.  Past Medical History  Diagnosis Date  . Hypertension   . GERD (gastroesophageal reflux disease)   . Hemochromatosis   . Hx of adenomatous colonic polyps   . Umbilical hernia     none since weight loss  . Diverticulosis   . Lumbar spondylosis     ankle  . Pleural effusion on right 12/16/13    per chest xray  . Shortness of breath     with  exertion  . Chronic pancreatitis   . IBS (irritable bowel syndrome)   . Lung cancer 06/2014  . 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 Pleural effusion 07-11-15    Past Surgical History  Procedure Laterality Date  . Lumbar laminectomy  1983  . Knee arthrsoscopy  2003    left  . Tonsillectomy  1962  . Eus N/A 04/07/2013    Procedure: UPPER ENDOSCOPIC ULTRASOUND (EUS) LINEAR;  Surgeon: [name], MD;  Location: [facility];  Service: Endoscopy;  Laterality: N/A;  . Video assisted thoracoscopy (vats)/decortication Right 06/22/2014    Procedure: VIDEO ASSISTED THORACOSCOPY (VATS)/DECORTICATION;  Surgeon: [name], MD;  Location: [facility];  Service: Thoracic;  Laterality: Right;  . Pleural effusion drainage Right 06/22/2014    Procedure: DRAINAGE OF PLEURAL EFFUSION;  Surgeon: [name], MD;  Location: [facility];  Service: Thoracic;  Laterality: Right;  . Eus N/A 07/20/2014    Procedure: UPPER ENDOSCOPIC  ULTRASOUND (EUS) LINEAR;  Surgeon: [name], MD;  Location: [facility];  Service: Endoscopy;  Laterality: N/A;      Current Outpatient Prescriptions  Medication Sig Dispense Refill  . albuterol (PROVENTIL) (2.5 MG/3ML) 0.083% nebulizer solution Take 3 mLs (2.5 mg total) by nebulization every 6 (six) hours as needed for wheezing or shortness of breath. 75 mL 12  . dextromethorphan-guaiFENesin (MUCINEX DM) 30-600 MG per 12 hr tablet Take 1 tablet by mouth 2 (two) times daily as needed for cough. 30 tablet 0  . docusate sodium (COLACE) 100 MG capsule Take 1 capsule (100 mg total) by mouth 2 (two) times daily. 10 capsule 0  . DULERA 200-5 MCG/ACT AERO Inhale 1 puff into the lungs 2 (two) times daily.  10  . EPIPEN 2-PAK 0.3 MG/0.3ML SOAJ injection See admin instructions.  1  . feeding supplement, ENSURE ENLIVE, (ENSURE ENLIVE) LIQD Take 237 mLs by mouth 2 (two) times daily between meals. 195 mL 12  . folic acid (FOLVITE) 1 MG tablet Take 1 tablet (1 mg total) by mouth daily. 30 tablet 3  . furosemide (LASIX) 40 MG tablet Take 40 mg by mouth every morning.   11  . KLOR-CON M10 10 MEQ tablet Take 10 mEq by mouth 2 (two) times daily.  4  . magic mouthwash  SOLN Take 5 mLs by mouth 4 (four) times daily. Take 5 mls swish and spit  Qid prn sore mouth 240 mL 0  . morphine (MSIR) 15 MG tablet Take 1 tablet (15 mg total) by mouth every 4 (four) hours as needed for severe pain. 60 tablet 0  . oxyCODONE (OXY IR/ROXICODONE) 5 MG immediate release tablet Take 1 tablet (5 mg total) by mouth every 4 (four) hours as needed for moderate pain. 60 tablet 0  . polyethylene glycol (MIRALAX / GLYCOLAX) packet Take 17 g by mouth daily as needed for moderate constipation. 14 each 0  . predniSONE (DELTASONE) 10 MG tablet Take 1 tablet (10 mg total) by mouth daily with breakfast. 30 tablet 1  . PROAIR HFA 108 (90 BASE) MCG/ACT inhaler Inhale 1 each into the lungs 2 (two) times daily.  3  . tamsulosin (FLOMAX) 0.4 MG  CAPS capsule Take 0.4 mg by mouth daily.  11   No current facility-administered medications for this visit.    Physical Exam There were no vitals taken for this visit. Ill appearing 64 yo man in mild distress with increased WOB HEENT- [place] O2 in place Lungs diminished BS at bases L>R Cardiac RR normal S1, S2 Abdomen- markedly distended, not tympanitic Extremities- 2+ edema  Diagnostic Tests: CXR reviewed- moderate residual left effusion after thoracentesis  Impression: 64 yo man with a malignant left pleural effusion. He has a significant effusion even after thoracentesis a week ago. He would benefit from a pleural catheter to drain the effusion on a regular basis for palliative purposes. There is a slight possibility this could also help his ascites.  I discussed the procedure with him. It will be done as an outpatient. He will have an indwelling catheter that will need care postop. I informed him of the indications, risks, benefits and alternatives. He understands the risks include but are not limited to bleeding, infection, catheter malposition, catheter occlusion, as well as the possibility of other unforeseeable complications.  He agrees to proceed  Ascites- he has severe symptomatic ascites. He would benefit from paracentesis. We will see if we can arrange for that.  Plan: Left pleural catheter placement next week. My office will call him to schedule.  [name], MD [facility] [phone]

## 2015-08-14 ENCOUNTER — Encounter (HOSPITAL_COMMUNITY): Payer: Self-pay | Admitting: Thoracic Surgery (Cardiothoracic Vascular Surgery)

## 2015-08-14 ENCOUNTER — Telehealth: Payer: Self-pay | Admitting: Internal Medicine

## 2015-08-14 NOTE — Op Note (Signed)
NAMEHENDRIX, YURKOVICH                 ACCOUNT NO.:  [id number]  MEDICAL RECORD NO.:  [id number]  LOCATION:  [facility]                         FACILITY:  [facility]  PHYSICIAN:  Revonda Standard. Roxan Hockey, M.D.DATE OF BIRTH:  Nov 13, 1951  DATE OF PROCEDURE:  08/13/2015 DATE OF DISCHARGE:  08/13/2015                              OPERATIVE REPORT   PREOPERATIVE DIAGNOSIS:  Malignant left pleural effusion.  POSTOPERATIVE DIAGNOSIS:  Malignant left pleural effusion.  PROCEDURE:  Insertion of left pleural catheter.  SURGEONS:  Revonda Standard. Roxan Hockey, M.D.  ASSISTANT:  None.  ANESTHESIA:  Local with intravenous sedation.  FINDINGS:  1250 mL of thick brown fluid evacuated. Residual basilar space by fluoroscopy post drainage.  No residual effusion.  CLINICAL NOTE:  Mr. Flagg is a 64 year old man with metastatic lung cancer who has a symptomatic malignant left pleural effusion.  He was offered left pleural catheter placement for palliative drainage of the effusion.  The indications, risks, benefits, and alternatives were discussed in detail with the patient.  He understood and accepted the risks and agreed to proceed.  OPERATIVE NOTE:  Mr. Arriaga was brought to the operating room on August 13, 2015.  He was given intravenous sedation and monitored by the Anesthesia Service.  Intravenous antibiotics were administered.  He was placed in supine position with the left chest slightly elevated. The chest and abdomen were prepped and draped in the usual sterile fashion.  After performing a surgical and radiation safety time-outs, the operative site was anesthetized with 1% lidocaine.  A total of 15 mL was used.  The pleural effusion was easily accessed with a finder needle.  An incision was made at the entry site on the lateral chest wall. The left pleural space was accessed using the Seldinger technique. Fluoroscopy confirmed that wire was in the chest.  The catheter then was tunneled from the exit site  anteriorly to the lateral incision.  The tract was dilated, and the peel-away sheath introducer was placed.  The inner cannula was removed.  The pleural catheter was advanced through the peel-away sheath. It was noted that the fluid was very thick and brown in color.  Specimens were sent for aerobic and anaerobic cultures.  Fluoroscopy confirmed position of the pleural catheter within the pleural space.  It was secured at the exit site with a 3-0 nylon suture.  The catheter was hooked to suction and 1.25 L of fluid was evacuated.  Fluoroscopy at the completion of the drainage showed a residual basilar space with incomplete re-expansion of the left lower lobe.  The lateral incision was closed with a running 3-0 Vicryl subcuticular suture.  Dermabond was applied. The catheter was dressed.  The patient was taken from the operating room to the postanesthetic care unit in good condition.     Revonda Standard Roxan Hockey, M.D.     SCH/MEDQ  D:  08/13/2015  T:  08/14/2015  Job:  [id number]

## 2015-08-14 NOTE — Telephone Encounter (Signed)
returned pt call and s.w. pt and confirmed 9.29 appts....pt ok and aware

## 2015-08-15 ENCOUNTER — Telehealth: Payer: Self-pay | Admitting: *Deleted

## 2015-08-15 ENCOUNTER — Telehealth: Payer: Self-pay

## 2015-08-15 ENCOUNTER — Other Ambulatory Visit: Payer: Self-pay | Admitting: Oncology

## 2015-08-15 DIAGNOSIS — C799 Secondary malignant neoplasm of unspecified site: Secondary | ICD-10-CM

## 2015-08-15 NOTE — Telephone Encounter (Addendum)
Reviewed with MD, order entered for US paracentesis.  Called scheduling 1st avail 9/30 2pm. Maylon Peppers- (Sister) with appt date/time. Vaughan Basta advised he is unable to eat, and the breathing is getting worse. Called scheduling to inquire if any appt available tomorrow 9/29. No appts available as they have double booked appts.

## 2015-08-15 NOTE — Telephone Encounter (Signed)
Dewaine Oats RN with hospice left voicemail.  "Henry [name] receives home palliative care.  He is requesting [facility].  Does Dr. Julien Nordmann agree, will he be the attending?  Please call me at [phone] with any orders."

## 2015-08-15 NOTE — Addendum Note (Signed)
Addended by: Lucile Crater on: 08/15/2015 04:06 PM   Modules accepted: Orders

## 2015-08-15 NOTE — Telephone Encounter (Signed)
He can go to the ED if condition worse before Friday.

## 2015-08-15 NOTE — Telephone Encounter (Signed)
Evette at Hospice called lmovm pt has been placed on hospice. Will Dr. Julien Nordmann be attending?  Returned call to hospice s/w Jacqlyn Larsen- MD will be attending. Gave Becky update that pt recently had chest tube placed, orders for this to be drained to be handled by Hospice. Jacqlyn Larsen thanked me for the call no further concerns.

## 2015-08-15 NOTE — Telephone Encounter (Signed)
Pt sister Vaughan Basta called - reports her brother is having shortness of breath and early satiety and feels he needs another paracentesis.  They are also asking if there are any alternatives to manage the fluid.  Renato Gails that we would send this message to the desk nurse to arrange a centesis for the pt tomorrow while he is here.  Per Vaughan Basta, they cannot come in this afternoon due to appt with home health agency.  Renato Gails they should discuss permanent catheter placement with Dr. Julien Nordmann at the office visit tomorrow.  Vaughan Basta voiced understanding.    Routed to MD and desk RN for further action.

## 2015-08-15 NOTE — Telephone Encounter (Signed)
Patient's sister called asking if "Triage nurse she spoke with earlier about abdominal fluid will find out from Dr. Julien Nordmann if procedure tomorrow will be a tap or a permanent drain.  [place] would like a permanent solution."  Next scheduled F./u is 08-16-2015 beginning at 0945.

## 2015-08-15 NOTE — Telephone Encounter (Signed)
Lucile Crater, RN at 08/15/2015 12:28 PM     Status: Signed       Expand All Collapse All   Evette at [facility]osp[facility] called lmovm pt has been placed on hospice. Will Dr. Julien Nordmann be attending?  Returned call to hospice s/w Jacqlyn Larsen- MD will be attending. Gave Becky update that pt recently had chest tube placed, orders for this to be drained to be handled by Hospice. Jacqlyn Larsen thanked me for the call no further concerns.

## 2015-08-16 ENCOUNTER — Other Ambulatory Visit (HOSPITAL_BASED_OUTPATIENT_CLINIC_OR_DEPARTMENT_OTHER): Payer: BLUE CROSS/BLUE SHIELD

## 2015-08-16 ENCOUNTER — Ambulatory Visit (HOSPITAL_COMMUNITY)
Admission: RE | Admit: 2015-08-16 | Discharge: 2015-08-16 | Disposition: A | Discharge status: Home / Self Care | Payer: BLUE CROSS/BLUE SHIELD | Source: Ambulatory Visit | Attending: Internal Medicine | Admitting: Internal Medicine

## 2015-08-16 ENCOUNTER — Ambulatory Visit: Payer: BLUE CROSS/BLUE SHIELD

## 2015-08-16 ENCOUNTER — Ambulatory Visit (HOSPITAL_BASED_OUTPATIENT_CLINIC_OR_DEPARTMENT_OTHER): Payer: BLUE CROSS/BLUE SHIELD | Admitting: Internal Medicine

## 2015-08-16 ENCOUNTER — Ambulatory Visit: Payer: BLUE CROSS/BLUE SHIELD | Admitting: Internal Medicine

## 2015-08-16 ENCOUNTER — Other Ambulatory Visit: Payer: BLUE CROSS/BLUE SHIELD

## 2015-08-16 ENCOUNTER — Encounter: Payer: Self-pay | Admitting: Internal Medicine

## 2015-08-16 VITALS — BP 121/81 | HR 109 | Temp 98.0°F | Resp 16 | Ht 72.0 in

## 2015-08-16 DIAGNOSIS — C3411 Malignant neoplasm of upper lobe, right bronchus or lung: Secondary | ICD-10-CM | POA: Diagnosis not present

## 2015-08-16 DIAGNOSIS — R180 Malignant ascites: Secondary | ICD-10-CM | POA: Insufficient documentation

## 2015-08-16 DIAGNOSIS — C799 Secondary malignant neoplasm of unspecified site: Secondary | ICD-10-CM

## 2015-08-16 DIAGNOSIS — E43 Unspecified severe protein-calorie malnutrition: Secondary | ICD-10-CM

## 2015-08-16 DIAGNOSIS — C801 Malignant (primary) neoplasm, unspecified: Secondary | ICD-10-CM | POA: Diagnosis not present

## 2015-08-16 DIAGNOSIS — J90 Pleural effusion, not elsewhere classified: Secondary | ICD-10-CM | POA: Diagnosis not present

## 2015-08-16 DIAGNOSIS — R188 Other ascites: Secondary | ICD-10-CM | POA: Diagnosis present

## 2015-08-16 DIAGNOSIS — C3491 Malignant neoplasm of unspecified part of right bronchus or lung: Secondary | ICD-10-CM

## 2015-08-16 LAB — CBC WITH DIFFERENTIAL/PLATELET
BASO%: 0.5 % (ref 0.0–2.0)
Basophils Absolute: 0.1 10*3/uL (ref 0.0–0.1)
EOS ABS: 0 10*3/uL (ref 0.0–0.5)
EOS%: 0.3 % (ref 0.0–7.0)
HEMATOCRIT: 43.5 % (ref 38.4–49.9)
HEMOGLOBIN: 15.2 g/dL (ref 13.0–17.1)
LYMPH#: 0.8 10*3/uL — AB (ref 0.9–3.3)
LYMPH%: 5.6 % — AB (ref 14.0–49.0)
MCH: 33.9 pg — ABNORMAL HIGH (ref 27.2–33.4)
MCHC: 34.9 g/dL (ref 32.0–36.0)
MCV: 97.2 fL (ref 79.3–98.0)
MONO#: 0.6 10*3/uL (ref 0.1–0.9)
MONO%: 4.4 % (ref 0.0–14.0)
NEUT%: 89.2 % — ABNORMAL HIGH (ref 39.0–75.0)
NEUTROS ABS: 12.6 10*3/uL — AB (ref 1.5–6.5)
PLATELETS: 335 10*3/uL (ref 140–400)
RBC: 4.47 10*6/uL (ref 4.20–5.82)
RDW: 15.1 % — ABNORMAL HIGH (ref 11.0–14.6)
WBC: 14.1 10*3/uL — AB (ref 4.0–10.3)

## 2015-08-16 LAB — COMPREHENSIVE METABOLIC PANEL (CC13)
ALBUMIN: 2.9 g/dL — AB (ref 3.5–5.0)
ALK PHOS: 91 U/L (ref 40–150)
ALT: 12 U/L (ref 0–55)
ANION GAP: 9 meq/L (ref 3–11)
AST: 15 U/L (ref 5–34)
BILIRUBIN TOTAL: 0.57 mg/dL (ref 0.20–1.20)
BUN: 34.6 mg/dL — ABNORMAL HIGH (ref 7.0–26.0)
CALCIUM: 9.6 mg/dL (ref 8.4–10.4)
CO2: 35 mEq/L — ABNORMAL HIGH (ref 22–29)
CREATININE: 1.3 mg/dL (ref 0.7–1.3)
Chloride: 92 mEq/L — ABNORMAL LOW (ref 98–109)
EGFR: 58 mL/min/{1.73_m2} — AB (ref >90)
Glucose: 127 mg/dl (ref 70–140)
Potassium: 4.4 mEq/L (ref 3.5–5.1)
Sodium: 136 mEq/L (ref 136–145)
TOTAL PROTEIN: 6.5 g/dL (ref 6.4–8.3)

## 2015-08-16 NOTE — Procedures (Signed)
Successful US guided paracentesis from RLQ.  Yielded 4 liters of bloody fluid.  No immediate complications.  Pt tolerated well.   Specimen was not sent for labs.  WENDY S BLAIR PA-C 08/16/2015 1:17 PM

## 2015-08-16 NOTE — Progress Notes (Signed)
[place] [phone]   [phone]  OFFICE PROGRESS NOTE  Donnie Coffin, [place] [address] [place] [address]  DIAGNOSIS: Metastatic adenocarcinoma of unknown primary but questionable for upper gastrointestinal, pancreaticobiliary or primary lung cancer, diagnosed in August of 2015.  Genomic Alterations Identified? FGFR2 A97T KRAS Q61H - subclonal? ARID1A [id number]* - subclonal?  PRIOR THERAPY:  1) Status post right video-assisted thoracoscopy, drainage of pleural effusion, pleural biopsy, partial decortication, talc pleurodesis.under the care of Dr. Roxan Hockey on 06/23/2014. 2) Systemic chemotherapy with carboplatin for AUC of 5 and Alimta 500 mg/M2 every 3 weeks. He is status post 6 cycles, with stable disease after cycle #6. 3) Maintenance chemotherapy with single agent Alimta 500 MG/M2 every 3 weeks. First dose 12/20/2014. He is status post 9 cycles discontinued secondary to disease progression. 4) Second line treatment with immunotherapy with Nivolumab 3 MG/KG every 2 weeks. Status post one cycle discontinued secondary to deterioration of his condition.  CURRENT THERAPY: Palliative and hospice care.  INTERVAL HISTORY: Henry [name] 64 y.o. male returns to the clinic today on a wheelchair for follow-up visit accompanied by his sister. The patient has been complaining of increasing fatigue and weakness and his condition has been deteriorating rapidly recently. He has rapid accumulation of the left pleural effusion as well as ascites. The patient underwent left Pleurx catheter placement by Dr. Roxan Hockey and he is currently undergoing drainage by home health. He gained around 200-300 ML every 2-3 days. The patient also has activation of the ascites and on 08/10/2015 he underwent paracentesis with drainage of 4.2 L of ascites. He came today with worsening abdominal distention and requesting refill her for ultrasound paracentesis  again. He continues to have shortness breath with minimal exertion. He has no fever or chills. He tolerated the first cycle of his immunotherapy fairly well. The patient was supposed to start cycle #2 of his immunotherapy today. He is here for evaluation and discussion of his treatment options.  MEDICAL HISTORY: Past Medical History  Diagnosis Date  . Hypertension   . GERD (gastroesophageal reflux disease)   . Hemochromatosis   . Hx of adenomatous colonic polyps   . Umbilical hernia     none since weight loss  . Diverticulosis   . Lumbar spondylosis     ankle  . Pleural effusion on right 12/16/13    per chest xray  . Shortness of breath     with  exertion  . Chronic pancreatitis   . IBS (irritable bowel syndrome)   . Lung cancer 06/2014  . 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 Pleural effusion 07-11-15  . Ascites   . On home oxygen therapy   . CHF (congestive heart failure)     ALLERGIES:  is allergic to other.  MEDICATIONS:  Current Outpatient Prescriptions  Medication Sig Dispense Refill  . acetaminophen-codeine (TYLENOL #3) 300-30 MG tablet TAKE 1-2 TABLETS BY MOUTH EVERY 4 HOURS AS NEEDED FOR MODERATE PAIN  0  . albuterol (PROVENTIL) (2.5 MG/3ML) 0.083% nebulizer solution Take 3 mLs (2.5 mg total) by nebulization every 6 (six) hours as needed for wheezing or shortness of breath. 75 mL 12  . dextromethorphan-guaiFENesin (MUCINEX DM) 30-600 MG per 12 hr tablet Take 1 tablet by mouth 2 (two) times daily as needed for cough. 30 tablet 0  . docusate sodium (COLACE) 100 MG capsule Take 1 capsule (100 mg total) by mouth 2 (two) times daily. 10 capsule 0  .  DULERA 200-5 MCG/ACT AERO Inhale 1 puff into the lungs 2 (two) times daily.  10  . EPIPEN 2-PAK 0.3 MG/0.3ML SOAJ injection See admin instructions.  1  . feeding supplement, ENSURE ENLIVE, (ENSURE ENLIVE) LIQD Take 237 mLs by mouth 2 (two) times daily between meals. (Patient taking differently: Take 237 mLs by mouth every other  day. ) 010 mL 12  . folic acid (FOLVITE) 1 MG tablet Take 1 tablet (1 mg total) by mouth daily. 30 tablet 3  . furosemide (LASIX) 40 MG tablet Take 40 mg by mouth every morning.   11  . KLOR-CON M10 10 MEQ tablet Take 10 mEq by mouth 2 (two) times daily.  4  . metoprolol succinate (TOPROL-XL) 50 MG 24 hr tablet   11  . morphine (MSIR) 15 MG tablet Take 1 tablet (15 mg total) by mouth every 4 (four) hours as needed for severe pain. 60 tablet 0  . oxyCODONE (OXY IR/ROXICODONE) 5 MG immediate release tablet Take 1-2 tablets (5-10 mg total) by mouth every 4 (four) hours as needed (pain). 40 tablet 0  . polyethylene glycol (MIRALAX / GLYCOLAX) packet TAKE 17 G BY MOUTH DAILY AS NEEDED FOR MODERATE CONSTIPATION.  0  . predniSONE (DELTASONE) 10 MG tablet Take 1 tablet (10 mg total) by mouth daily with breakfast. 30 tablet 1  . PROAIR HFA 108 (90 BASE) MCG/ACT inhaler Inhale 1 each into the lungs 2 (two) times daily.  3  . spironolactone (ALDACTONE) 25 MG tablet   4  . tamsulosin (FLOMAX) 0.4 MG CAPS capsule Take 0.4 mg by mouth daily.  11  . valsartan (DIOVAN) 160 MG tablet   10   No current facility-administered medications for this visit.    SURGICAL HISTORY:  Past Surgical History  Procedure Laterality Date  . Lumbar laminectomy  1983  . Knee arthrsoscopy  2003    left  . Tonsillectomy  1962  . Eus N/A 04/07/2013    Procedure: UPPER ENDOSCOPIC ULTRASOUND (EUS) LINEAR;  Surgeon: [name], MD;  Location: [facility];  Service: Endoscopy;  Laterality: N/A;  . Video assisted thoracoscopy (vats)/decortication Right 06/22/2014    Procedure: VIDEO ASSISTED THORACOSCOPY (VATS)/DECORTICATION;  Surgeon: [name], MD;  Location: [facility];  Service: Thoracic;  Laterality: Right;  . Pleural effusion drainage Right 06/22/2014    Procedure: DRAINAGE OF PLEURAL EFFUSION;  Surgeon: [name], MD;  Location: [facility];  Service: Thoracic;  Laterality: Right;  . Eus N/A 07/20/2014     Procedure: UPPER ENDOSCOPIC ULTRASOUND (EUS) LINEAR;  Surgeon: [name], MD;  Location: [facility];  Service: Endoscopy;  Laterality: N/A;  . [place] thora/paracentesis Right 08/10/15    Right lower abdomen.  4.2 liters drained.  . Chest tube insertion Left 08/13/2015    Procedure: INSERTION PLEURAL DRAINAGE CATHETER;  Surgeon: [name], MD;  Location: [facility];  Service: Thoracic;  Laterality: Left;    REVIEW OF SYSTEMS:  Constitutional: positive for anorexia, fatigue, malaise and weight loss Eyes: negative Ears, nose, mouth, throat, and face: negative Respiratory: positive for cough, dyspnea on exertion, pleurisy/chest pain, sputum and wheezing Cardiovascular: negative Gastrointestinal: positive for abdominal pain and Abdominal distention Genitourinary:negative Integument/breast: negative Hematologic/lymphatic: negative Musculoskeletal:positive for muscle weakness Neurological: negative Behavioral/Psych: negative Endocrine: negative Allergic/Immunologic: negative   PHYSICAL EXAMINATION: General appearance: alert, cooperative and no distress Head: Normocephalic, without obvious abnormality, atraumatic Neck: no adenopathy, no JVD, supple, symmetrical, trachea midline and thyroid not enlarged, symmetric, no tenderness/mass/nodules Lymph nodes:  Cervical, supraclavicular, and axillary nodes normal. Resp: clear to auscultation bilaterally Back: symmetric, no curvature. ROM normal. No CVA tenderness. Cardio: regular rate and rhythm, S1, S2 normal, no murmur, click, rub or gallop GI: soft, non-tender; bowel sounds normal; no masses,  no organomegaly Extremities: edema 1+ bilaterally Neurologic: Alert and oriented X 3, normal strength and tone. Normal symmetric reflexes. Normal coordination and gait  ECOG PERFORMANCE STATUS: 2 - Symptomatic, <50% confined to bed  Blood pressure 121/81, pulse 109, temperature 98 F (36.7 C), temperature source Oral, resp. rate 16, height 6'  (1.829 m), SpO2 98 %.  LABORATORY DATA: Lab Results  Component Value Date   WBC 14.1* 08/16/2015   HGB 15.2 08/16/2015   HCT 43.5 08/16/2015   MCV 97.2 08/16/2015   PLT 335 08/16/2015      Chemistry      Component Value Date/Time   NA 137 08/13/2015 1031   NA 140 07/26/2015 1009   K 3.6 08/13/2015 1031   K 3.9 07/26/2015 1009   CL 92* 08/13/2015 1031   CO2 36* 08/13/2015 1031   CO2 37* 07/26/2015 1009   BUN 21* 08/13/2015 1031   BUN 26.0 07/26/2015 1009   CREATININE 0.96 08/13/2015 1031   CREATININE 1.0 07/26/2015 1009      Component Value Date/Time   CALCIUM 9.3 08/13/2015 1031   CALCIUM 9.4 07/26/2015 1009   ALKPHOS 75 08/13/2015 1031   ALKPHOS 70 07/26/2015 1009   AST 23 08/13/2015 1031   AST 23 07/26/2015 1009   ALT 15* 08/13/2015 1031   ALT 19 07/26/2015 1009   BILITOT 0.6 08/13/2015 1031   BILITOT 0.74 07/26/2015 1009       RADIOGRAPHIC STUDIES: Dg Chest 1 View  07/30/2015   CLINICAL DATA:  Status post left thoracentesis.  EXAM: CHEST  1 VIEW  COMPARISON:  Chest x-ray on 07/29/2015  FINDINGS: There is decrease in left pleural fluid volume after thoracentesis. Some fluid remains. No pneumothorax. Smaller right pleural effusion is suspected.  IMPRESSION: No pneumothorax after left thoracentesis with reduction in pleural fluid volume. Smaller right pleural effusion is suspected.   Electronically Signed   By: Aletta Edouard M.D.   On: 07/30/2015 15:44   Dg Chest 2 View  08/13/2015   CLINICAL DATA:  Left-sided pleural effusion. Preoperative evaluation.  EXAM: CHEST  2 VIEW  COMPARISON:  Chest CT July 29, 2015; chest radiograph July 30, 2015  FINDINGS: There is a 2 x 1.7 cm nodular lesion in the right base, stable. There is elevation left hemidiaphragm with loculated effusion on the left in left base consolidation, stable. There is bibasilar atelectasis. There is asymmetric pleural thickening on the right, stable. No new opacity. Heart is upper normal in size  with pulmonary vascularity within normal limits. No adenopathy. No bone lesions.  IMPRESSION: Stable nodular lesion right base. Stable consolidation with effusion on the left. Stable asymmetric right apical pleural thickening. No new opacity. No change in cardiac silhouette.   Electronically Signed   By: Lowella Grip III M.D.   On: 08/13/2015 10:33   Dg Chest 2 View  07/29/2015   CLINICAL DATA:  Shortness of breath. Abdominal distention. History of lung cancer. Recent chemotherapy.  EXAM: CHEST  2 VIEW  COMPARISON:  07/26/2015  FINDINGS: Cardiomediastinal silhouette is grossly unchanged, with the cardiac silhouette being partially obscured. Large left pleural effusion is unchanged with associated compressive left upper and lower lobe atelectasis. Small right pleural effusion also does not appear significantly changed. Patchy  and nodular parenchymal opacities in the right lung base are unchanged. No definite new areas of airspace consolidation are seen. There is no pneumothorax. No acute osseous abnormality is seen.  IMPRESSION: Unchanged examination with large left and small right pleural effusions, associated compressive atelectasis, and chronic right basilar opacities as above.   Electronically Signed   By: Logan Bores M.D.   On: 07/29/2015 17:37   Dg Chest 2 View  07/26/2015   CLINICAL DATA:  Metastatic adenocarcinoma. Shortness of breath. Right lung cancer.  EXAM: CHEST  2 VIEW  COMPARISON:  Chest x-rays dated 07/20/2015, 07/14/2015 and 07/12/2015 and CT scan dated 07/10/2015  FINDINGS: There has been slight increase in the large left pleural effusion with further compressive atelectasis of the upper and lower lobes. Ill-defined nodular densities and chronic pleural effusion at the right base and in the right midzone appear unchanged. Pulmonary vascularity is normal.  No acute osseous abnormality.  IMPRESSION: Increased large left pleural effusion with further compression of the left upper and lower  lobes. No change on the right.   Electronically Signed   By: Lorriane Shire M.D.   On: 07/26/2015 12:48   Dg Chest 2 View  07/20/2015   CLINICAL DATA:  Follow-up left pleural effusion, persistent shortness of breath, cough, and congestion. History of lung malignancy, COPD, and previous heavy tobacco use.  EXAM: CHEST  2 VIEW  COMPARISON:  PA and lateral chest x-ray of July 14, 2015  FINDINGS: There is a persistent moderate size left pleural effusion with small right pleural effusion. The pulmonary interstitial markings are slightly less conspicuous today. The cardiac silhouette remains enlarged. The pulmonary vascularity is less engorged. There is stable tortuosity of the descending thoracic aorta. The bony thorax exhibits no acute abnormality.  IMPRESSION: Improvement in CHF with less interstitial edema. Stable moderate size left pleural effusion and small right pleural effusion.   Electronically Signed   By: David  Martinique M.D.   On: 07/20/2015 13:41   Dg Abd 1 View  07/29/2015   CLINICAL DATA:  Shortness of breath and abdominal distension. History of lung cancer with recent chemotherapy.  EXAM: ABDOMEN - 1 VIEW  COMPARISON:  CT abdomen and pelvis 07/10/2015  FINDINGS: There is a small to moderate amount of stool and gas in nondilated colon. There is a paucity of small bowel gas, which limits evaluation for obstruction. Increased density in the abdomen may partially reflect ascites, as was present on the prior CT. The hepatic shadow is incompletely visualized but appears mildly enlarged. The uppermost portion of the abdomen was incompletely imaged. Mild scoliosis and moderate disc degeneration is noted in the lumbar spine.  IMPRESSION: 1. Increased density in the abdomen which may reflect ascites. 2. Small to moderate amount of stool in the colon. A paucity of small bowel gas limits evaluation for small bowel obstruction.   Electronically Signed   By: Logan Bores M.D.   On: 07/29/2015 17:42   Ct Chest W  Contrast  07/29/2015   CLINICAL DATA:  Hypotension.  Lung cancer.  EXAM: CT CHEST, ABDOMEN, AND PELVIS WITH CONTRAST  TECHNIQUE: Multidetector CT imaging of the chest, abdomen and pelvis was performed following the standard protocol during bolus administration of intravenous contrast.  CONTRAST:  110m OMNIPAQUE IOHEXOL 300 MG/ML  SOLN  COMPARISON:  CT chest, abdomen, and pelvis 07/10/2015.  FINDINGS: CT CHEST FINDINGS  Small right axillary and subpectoral lymph nodes measure up to 7 mm in short axis, unchanged. No enlarged supraclavicular lymph  nodes are identified. Small, scattered mediastinal lymph nodes measure up to 7 mm in short axis and are stable to minimally smaller than on the prior study. The heart is normal in size and is shifted rightward. Centrilobular emphysema is again noted.  Small right pleural effusion has not significantly changed in size, with loculated components again seen anteriorly and in a subpulmonic location. Increased right-sided pleural density is consistent with prior talc pleurodesis. Consolidative and ground-glass opacity in the right lower lobe has not significantly changed. Nodular densities in the anterior aspect of the basilar right lower lobe and in the right middle lobe are unchanged, measuring up to 2.1 cm in size (series 7, image 40). Diffuse, mild thickening along the right major and minor fissures with areas of mild nodularity is unchanged.  Large left pleural effusion has mildly increased in size overall with some loculation anteriorly. Small focus of likely loculated pleural fluid posteriorly towards the apex is slightly smaller. There is increased compressive atelectasis on the left lower lobe and lingula, now with near complete left lower lobe collapse. No definite enhancing pleural nodules are identified. 3 mm left upper lobe nodule is unchanged (series 7, image 26). Small focus of chronic scarring in the left upper lobe is unchanged (series 7, image 12). No  suspicious lytic or blastic osseous lesions are identified in the chest.  CT ABDOMEN AND PELVIS FINDINGS  The liver is enlarged, measuring approximately 20 cm in craniocaudal length. Small low-density liver lesions are unchanged and compatible with cysts, measuring up to 1.4 cm in size. Gallbladder is grossly unremarkable. Spleen and kidneys are unremarkable. Mild thickening of both adrenal glands is again noted. 13 mm mildly complex/septated cystic lesion in the pancreatic head is unchanged and may represent an IPMN.  Oral contrast in the distal esophagus may reflect dysmotility or reflux. Oral contrast is present in multiple nondilated loops of small bowel without evidence of obstruction. Three polypoid soft tissue lesions in the ascending colon are less prominent in size than on the most recent prior study and similar in size to the 05/01/2015 examination, measuring up to approximately 1.7 cm (series 2 images 80-85). There is a moderate amount of stool in the colon.  Bladder is unremarkable. Prostate is enlarged. There is mild-to-moderate atherosclerotic calcification of the abdominal aorta and its major branch vessels. 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 The SMV and portal vein appear patent. No enlarged lymph nodes are identified. There is large volume ascites, increased from the prior abdominal CT. Diffuse body wall edema is present. 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 No new suspicious osseous lesions are identified. Advanced disc degeneration is present at L4-5.  IMPRESSION: 1. Increased size of large left pleural effusion which appears partially loculated. Near complete collapse of the left lower lobe. 2. Unchanged, partially loculated right pleural effusion. 3. Unchanged right lower lobe consolidation and nodular densities in the anterior right lung base. 4. Large volume abdominal ascites, increased from prior. 5.  Decreased size of polypoid lesions in the ascending colon. 6. Unchanged L1 sclerotic vertebral metastasis. No new bone metastases identified.   Electronically Signed   By: Logan Bores M.D.   On: 07/29/2015 19:30   Ct Abdomen Pelvis W Contrast  07/29/2015   CLINICAL DATA:  Hypotension.  Lung cancer.  EXAM: CT CHEST, ABDOMEN, AND PELVIS WITH CONTRAST  TECHNIQUE: Multidetector CT imaging of the chest, abdomen and pelvis was performed following the standard protocol  during bolus administration of intravenous contrast.  CONTRAST:  151m OMNIPAQUE IOHEXOL 300 MG/ML  SOLN  COMPARISON:  CT chest, abdomen, and pelvis 07/10/2015.  FINDINGS: CT CHEST FINDINGS  Small right axillary and subpectoral lymph nodes measure up to 7 mm in short axis, unchanged. No enlarged supraclavicular lymph nodes are identified. Small, scattered mediastinal lymph nodes measure up to 7 mm in short axis and are stable to minimally smaller than on the prior study. The heart is normal in size and is shifted rightward. Centrilobular emphysema is again noted.  Small right pleural effusion has not significantly changed in size, with loculated components again seen anteriorly and in a subpulmonic location. Increased right-sided pleural density is consistent with prior talc pleurodesis. Consolidative and ground-glass opacity in the right lower lobe has not significantly changed. Nodular densities in the anterior aspect of the basilar right lower lobe and in the right middle lobe are unchanged, measuring up to 2.1 cm in size (series 7, image 40). Diffuse, mild thickening along the right major and minor fissures with areas of mild nodularity is unchanged.  Large left pleural effusion has mildly increased in size overall with some loculation anteriorly. Small focus of likely loculated pleural fluid posteriorly towards the apex is slightly smaller. There is increased compressive atelectasis on the left lower lobe and lingula, now with near complete left  lower lobe collapse. No definite enhancing pleural nodules are identified. 3 mm left upper lobe nodule is unchanged (series 7, image 26). Small focus of chronic scarring in the left upper lobe is unchanged (series 7, image 12). No suspicious lytic or blastic osseous lesions are identified in the chest.  CT ABDOMEN AND PELVIS FINDINGS  The liver is enlarged, measuring approximately 20 cm in craniocaudal length. Small low-density liver lesions are unchanged and compatible with cysts, measuring up to 1.4 cm in size. Gallbladder is grossly unremarkable. Spleen and kidneys are unremarkable. Mild thickening of both adrenal glands is again noted. 13 mm mildly complex/septated cystic lesion in the pancreatic head is unchanged and may represent an IPMN.  Oral contrast in the distal esophagus may reflect dysmotility or reflux. Oral contrast is present in multiple nondilated loops of small bowel without evidence of obstruction. Three polypoid soft tissue lesions in the ascending colon are less prominent in size than on the most recent prior study and similar in size to the 05/01/2015 examination, measuring up to approximately 1.7 cm (series 2 images 80-85). There is a moderate amount of stool in the colon.  Bladder is unremarkable. Prostate is enlarged. There is mild-to-moderate atherosclerotic calcification of the abdominal aorta and its major branch vessels. 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 The SMV and portal vein appear patent. No enlarged lymph nodes are identified. There is large volume ascites, increased from the prior abdominal CT. Diffuse body wall edema is present. 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 No new suspicious osseous lesions are identified. Advanced disc degeneration is present at L4-5.  IMPRESSION: 1. Increased size of large left pleural effusion which appears partially loculated. Near complete collapse of the left  lower lobe. 2. Unchanged, partially loculated right pleural effusion. 3. Unchanged right lower lobe consolidation and nodular densities in the anterior right lung base. 4. Large volume abdominal ascites, increased from prior. 5. Decreased size of polypoid lesions in the ascending colon. 6. Unchanged L1 sclerotic vertebral metastasis. No new bone metastases identified.   Electronically Signed   By: AZenia Resides  Jeralyn Ruths M.D.   On: 07/29/2015 19:30   US Paracentesis  08/10/2015   CLINICAL DATA:  Metastatic lung cancer, recurrent malignant ascites. Request for therapeutic paracentesis.  EXAM: ULTRASOUND GUIDED RIGHT LOWER QUADRANT PARACENTESIS  COMPARISON:  None.  PROCEDURE: An ultrasound guided paracentesis was thoroughly discussed with the patient and questions answered. The benefits, risks, alternatives and complications were also discussed. The patient understands and wishes to proceed with the procedure. Written consent was obtained.  Ultrasound was performed to localize and mark an adequate pocket of fluid in the right lower quadrant of the abdomen. The area was then prepped and draped in the normal sterile fashion. 1% Lidocaine was used for local anesthesia. Under ultrasound guidance a 19 gauge Yueh catheter was introduced. Paracentesis was performed. The catheter was removed and a dressing applied.  COMPLICATIONS: None.  FINDINGS: A total of approximately 4.2 liters of blood tinged fluid was removed. A fluid sample was not sent for laboratory analysis.  IMPRESSION: Successful ultrasound guided paracentesis yielding 4.2 liters of ascites.  Read by:  Gareth Eagle, PA-C   Electronically Signed   By: Sandi Mariscal M.D.   On: 08/10/2015 13:09   US Paracentesis  07/31/2015   INDICATION: Metastatic lung cancer, recurrent malignant ascites. Request is made for therapeutic paracentesis.  EXAM: ULTRASOUND-GUIDED THERAPEUTIC PARACENTESIS  COMPARISON:  Prior paracentesis on 07/13/2015  MEDICATIONS: None.  COMPLICATIONS: None  immediate  TECHNIQUE: Informed written consent was obtained from the patient after a discussion of the risks, benefits and alternatives to treatment. A timeout was performed prior to the initiation of the procedure.  Initial ultrasound scanning demonstrates a moderate amount of ascites within the right lower abdominal quadrant. The right lower abdomen was prepped and draped in the usual sterile fashion. 1% lidocaine was used for local anesthesia. Under direct ultrasound guidance, a 19 gauge, 10-cm, Yueh catheter was introduced. An ultrasound image was saved for documentation purposed. The paracentesis was performed. The catheter was removed and a dressing was applied. The patient tolerated the procedure well without immediate post procedural complication.  FINDINGS: A total of approximately 3.2 liters of blood-tinged fluid was removed.  IMPRESSION: Successful ultrasound-guided therapeutic paracentesis yielding 3.2 liters of peritoneal fluid.  Read by: Rowe Robert, PA-C   Electronically Signed   By: Jacqulynn Cadet M.D.   On: 07/31/2015 11:35   Dg C-arm 1-60 Min-no Report  08/13/2015   CLINICAL DATA: surgery   C-ARM 1-60 MINUTES  Fluoroscopy was utilized by the requesting physician.  No radiographic  interpretation.    US Thoracentesis Asp Pleural Space W/img Guide  07/31/2015   INDICATION: Patient with history of lung and thyroid cancers, dyspnea, recurrent left pleural effusion. Request is made for diagnostic and therapeutic left thoracentesis .  EXAM: ULTRASOUND GUIDED DIAGNOSTIC AND THERAPEUTIC LEFT THORACENTESIS  COMPARISON:  Prior thoracentesis on 07/12/2015  MEDICATIONS: None  COMPLICATIONS: None immediate  TECHNIQUE: Informed written consent was obtained from the patient after a discussion of the risks, benefits and alternatives to treatment. A timeout was performed prior to the initiation of the procedure.  Initial ultrasound scanning demonstrates a moderate to large left pleural effusion. The lower  chest was prepped and draped in the usual sterile fashion. 1% lidocaine was used for local anesthesia.  An ultrasound image was saved for documentation purposes. A 6 Fr Safe-T-Centesis catheter was introduced. The thoracentesis was performed. The catheter was removed and a dressing was applied. The patient tolerated the procedure well without immediate post procedural complication. The patient was escorted  to have an upright chest radiograph.  FINDINGS: A total of approximately 820 cc of dark bloody fluid was removed. Requested samples were sent to the laboratory.  IMPRESSION: Successful ultrasound-guided diagnostic and therapeutic left sided thoracentesis yielding 820 cc of pleural fluid.  Read by: Rowe Robert, PA-C   Electronically Signed   By: Jerilynn Mages.  Shick M.D.   On: 07/31/2015 08:40   ASSESSMENT AND PLAN: This is a very pleasant 64 years old white male with metastatic adenocarcinoma of unknown primary questionable for upper gastrointestinal, pancreatic or biliary or primary lung cancer. He completed systemic chemotherapy with carboplatin and Alimta status post 6 cycles and tolerating his treatment fairly well with a stable disease. This was followed by maintenance chemotherapy with single agent Alimta status post 9 cycles. This was discontinued recently secondary to disease progression. The patient was started on treatment with immunotherapy but he continues to have deterioration of his condition. I had a lengthy discussion with the patient and his sister today about his current condition. I strongly recommended for him to consider palliative care and hospice referral at this point. The patient his sister agreed to the current plan. For the recurrent ascites, I will arrange for the patient to have ultrasound-guided paracentesis today. He was advised to call immediately if he has any concerning symptoms in the interval.  The patient voices understanding of current disease status and treatment options and  is in agreement with the current care plan.  All questions were answered. The patient knows to call the clinic with any problems, questions or concerns. We can certainly see the patient much sooner if necessary.  Disclaimer: This note was dictated with voice recognition software. Similar sounding words can inadvertently be transcribed and may not be corrected upon review.

## 2015-08-17 ENCOUNTER — Telehealth: Payer: Self-pay | Admitting: *Deleted

## 2015-08-17 ENCOUNTER — Ambulatory Visit (HOSPITAL_COMMUNITY): Admission: RE | Admit: 2015-08-17 | Payer: BLUE CROSS/BLUE SHIELD | Source: Ambulatory Visit

## 2015-08-17 DIAGNOSIS — C799 Secondary malignant neoplasm of unspecified site: Secondary | ICD-10-CM

## 2015-08-17 LAB — BODY FLUID CULTURE
Culture: NO GROWTH
GRAM STAIN: NONE SEEN

## 2015-08-17 NOTE — Telephone Encounter (Signed)
Submitted PA request for Dulera 200 thru Cover My Meds. Key: [id number].  Sent for review. Will await for response.

## 2015-08-17 NOTE — Telephone Encounter (Signed)
De Burrs, RN with Hospice. Appt for paracentesis 10/3 at 1045 at [facility]

## 2015-08-17 NOTE — Telephone Encounter (Signed)
Henry [name] (sister)  called states " Henry [name] had 4L drained yesterday and would like to make an appt for Monday to have another paracentesis done. He is filling up pretty fast and I don't want to wait until Monday to get this done. We have an appt with the Hospice Rn this afternoon to discuss everything. I told Henry [name] he wasn't sick enough for hospice."  Discussed with Henry [name] ( sister ) , Hospice will care for Henry [name] and assist with his quality of life. It's a wonderful service.     Henry [name] stated " they will talk to [place] this afternoon and we will see. I would also like for Dr. Julien Nordmann to increase [name]'s prednisone to '20mg'$  day. He was previously on '10mg'$  while on the immunotherapy. Now that he is not on it I think it should be increased so he can feel better." Acknowledged Linda's request, advised MD is out of office today and I will give him her concern. It may be Monday before I am able to call her back with additional information. Advised Scheduling will call to discuss appt date/time for paracentesis. Henry [name] verbalized understanding. No further concerns.  Order for paracentesis entered.   Call received from [place] at [facility] regarding pt admission to [facility] 9/29. RN and SW will be out to see pt this afternoon to discuss plan going forward. Helene Kelp advised she spoke to both sister and pt yesterday at their home;  pt was unsure if he would like to proceed with hospice in home or [facility].  Informed Helene Kelp I will call her back with pt appt. No further concerns at this time.

## 2015-08-19 LAB — ANAEROBIC CULTURE: Gram Stain: NONE SEEN

## 2015-08-20 ENCOUNTER — Ambulatory Visit (HOSPITAL_COMMUNITY)
Admission: RE | Admit: 2015-08-20 | Discharge: 2015-08-20 | Disposition: A | Discharge status: Home / Self Care | Payer: BLUE CROSS/BLUE SHIELD | Source: Ambulatory Visit | Attending: Internal Medicine | Admitting: Internal Medicine

## 2015-08-20 DIAGNOSIS — C3490 Malignant neoplasm of unspecified part of unspecified bronchus or lung: Secondary | ICD-10-CM | POA: Insufficient documentation

## 2015-08-20 DIAGNOSIS — C799 Secondary malignant neoplasm of unspecified site: Secondary | ICD-10-CM

## 2015-08-20 DIAGNOSIS — R180 Malignant ascites: Secondary | ICD-10-CM | POA: Diagnosis present

## 2015-08-20 NOTE — Procedures (Signed)
Ultrasound-guided therapeutic paracentesis performed yielding 3.2 L hazy, blood tinged fluid. No immediate complications.

## 2015-08-21 NOTE — Telephone Encounter (Signed)
Give samples from my office and f/u with ov and formulary before samples run out

## 2015-08-21 NOTE — Telephone Encounter (Signed)
Spoke with pt's goddaughter. She states that they have enrolled him in [facility] and he would no longer need medication from [place]. Nothing further was needed.

## 2015-08-21 NOTE — Telephone Encounter (Signed)
Per cover my meds. PA Response - Unfavorable. No alternatives provided. Please advise MW thanks

## 2015-08-22 ENCOUNTER — Telehealth: Payer: Self-pay | Admitting: Internal Medicine

## 2015-08-22 ENCOUNTER — Telehealth: Payer: Self-pay | Admitting: Medical Oncology

## 2015-08-23 ENCOUNTER — Telehealth: Payer: Self-pay | Admitting: Internal Medicine

## 2015-08-23 NOTE — Telephone Encounter (Signed)
Received death certificate 72/8/20

## 2015-08-30 ENCOUNTER — Other Ambulatory Visit: Payer: BLUE CROSS/BLUE SHIELD

## 2015-08-30 ENCOUNTER — Ambulatory Visit: Payer: BLUE CROSS/BLUE SHIELD

## 2015-08-30 ENCOUNTER — Ambulatory Visit: Payer: BLUE CROSS/BLUE SHIELD | Admitting: Internal Medicine

## 2015-09-12 ENCOUNTER — Ambulatory Visit: Payer: BLUE CROSS/BLUE SHIELD | Admitting: Internal Medicine

## 2015-09-12 ENCOUNTER — Other Ambulatory Visit: Payer: BLUE CROSS/BLUE SHIELD

## 2015-09-13 ENCOUNTER — Other Ambulatory Visit: Payer: BLUE CROSS/BLUE SHIELD

## 2015-09-13 ENCOUNTER — Ambulatory Visit: Payer: BLUE CROSS/BLUE SHIELD | Admitting: Internal Medicine

## 2015-09-13 ENCOUNTER — Ambulatory Visit: Payer: BLUE CROSS/BLUE SHIELD

## 2015-09-18 NOTE — Telephone Encounter (Signed)
Pt died this am at [facility].

## 2015-09-18 NOTE — Telephone Encounter (Signed)
PAL - moved 10/27 lab/fu to 10/26 - tx remains 10/27. Left message for patient re change and appointments for 10/26 and 10/27. Other appointments remain the same and patient to get new schedule when he comes in on 10/13.

## 2015-09-18 DEATH — deceased

## 2015-09-27 ENCOUNTER — Other Ambulatory Visit: Payer: Self-pay | Admitting: Nurse Practitioner

## 2015-10-02 ENCOUNTER — Ambulatory Visit: Payer: BLUE CROSS/BLUE SHIELD | Admitting: Vascular Surgery

## 2016-03-01 IMAGING — CR DG CHEST 2V
2 series · 2 of 2 positions shown · non-contrast
Comparison: 06/15/2014

CLINICAL DATA: Post thoracentesis

EXAM:
CHEST  2 VIEW

[w chest pa]
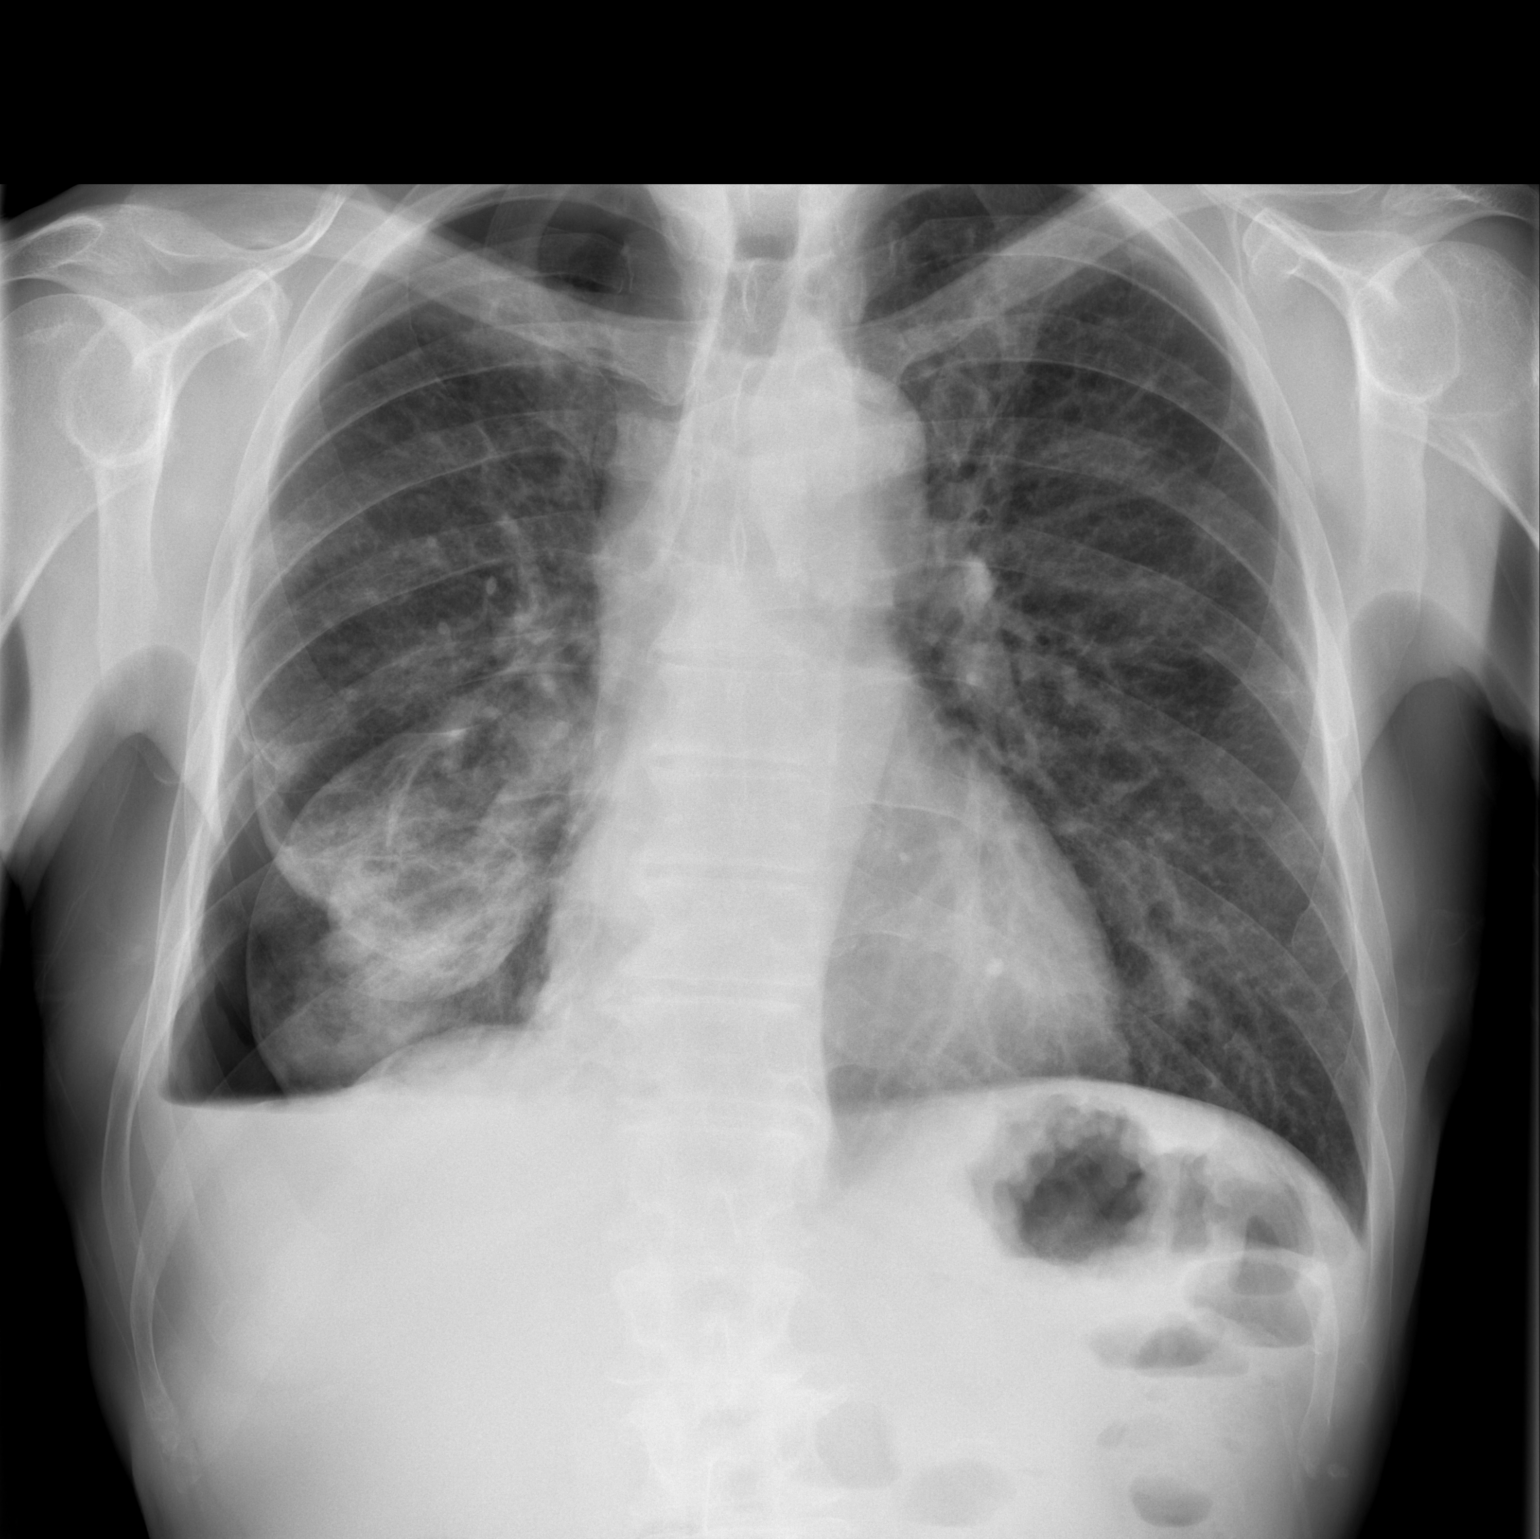

[w chest lat]
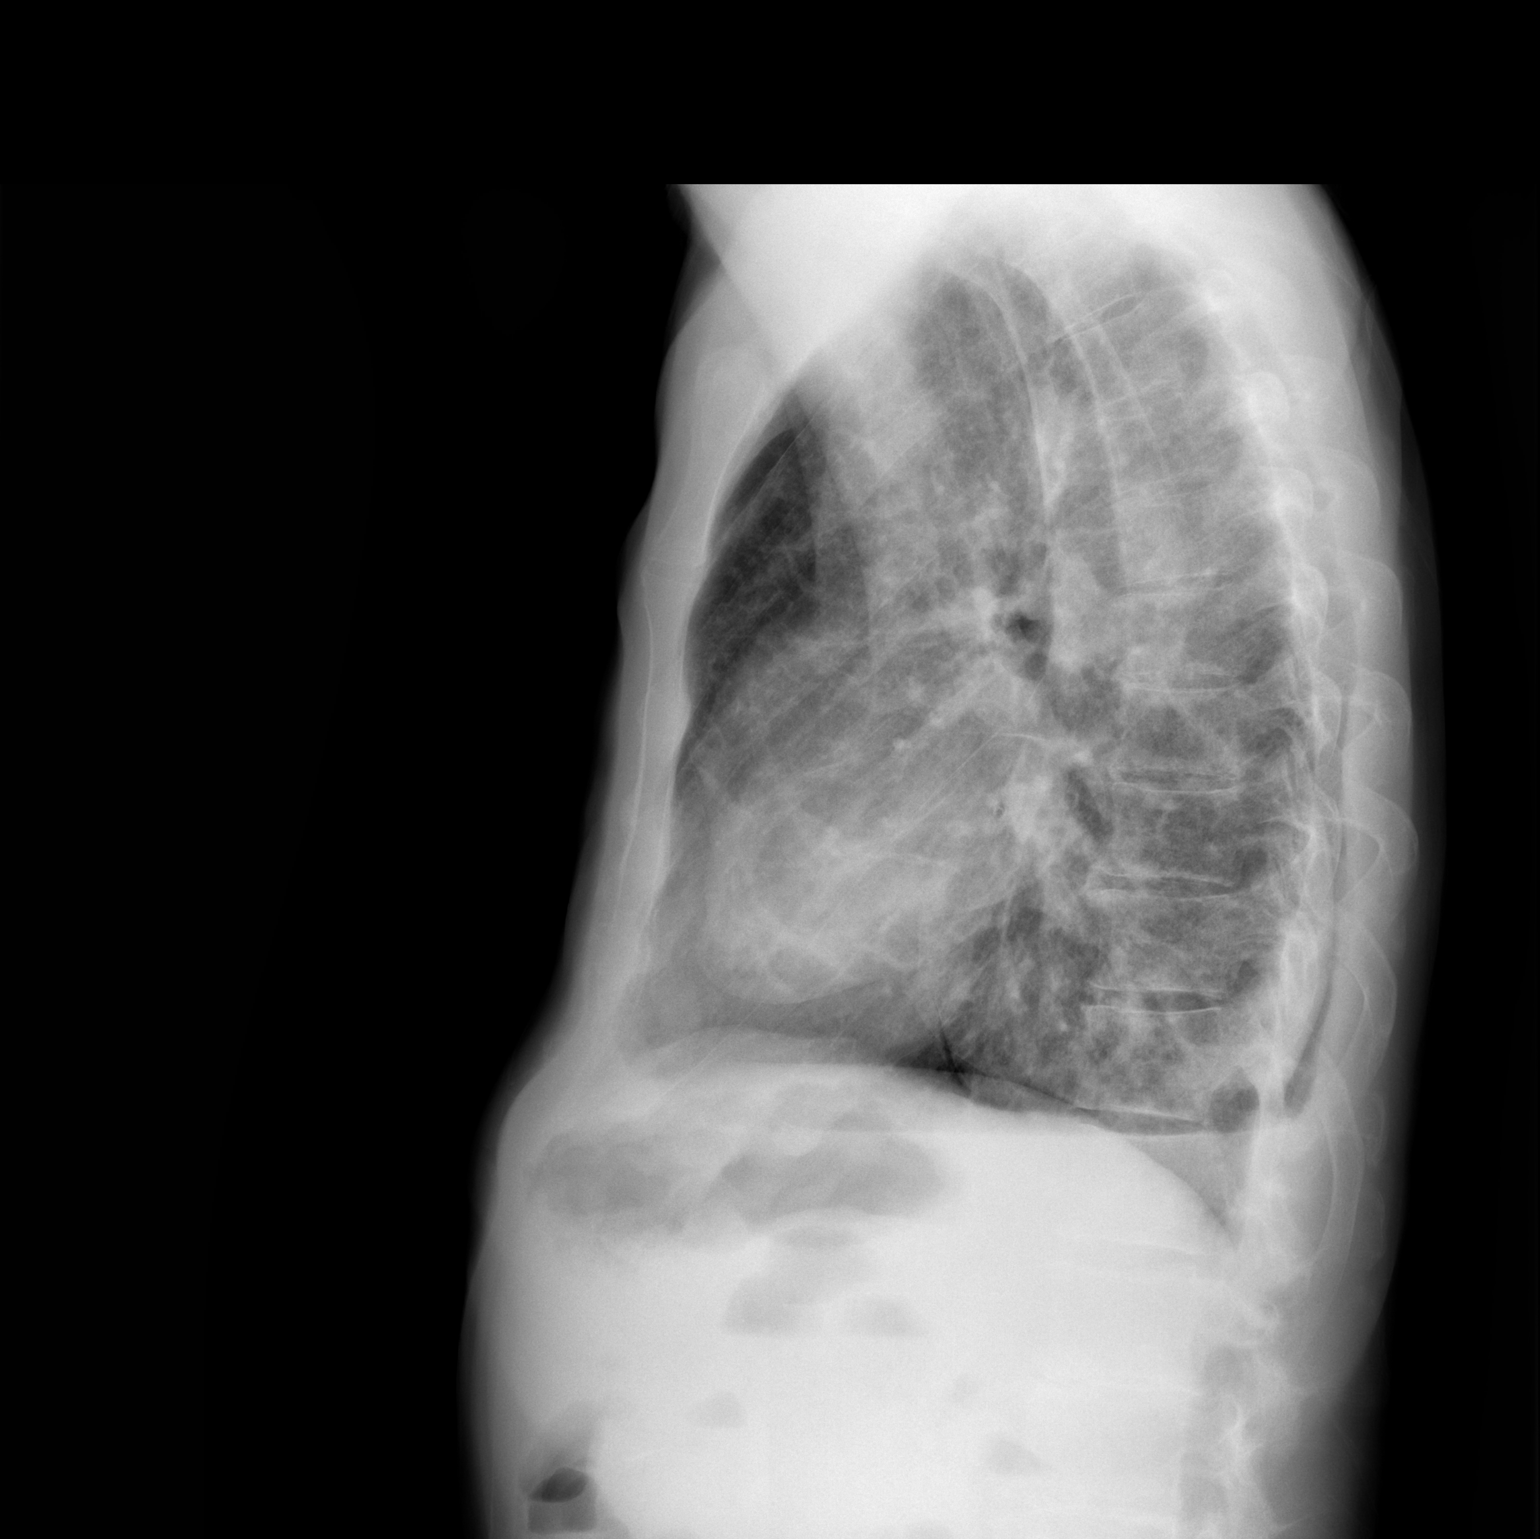

[2 of 2 positions shown; findings below may reference images not displayed]

FINDINGS: Significant decrease in right pleural effusion. Small pleural
effusion remains. Patient developed a small moderate right basilar
pneumothorax extending to the apex. Estimated 25% right
pneumothorax. There is right lower lobe atelectasis.

Left lung remains clear.
IMPRESSION: 25% right pneumothorax post thoracentesis.

These results will be called to the ordering clinician or
representative by the Radiologist Assistant, and communication
documented in the PACS or zVision Dashboard.

## 2016-03-12 IMAGING — CR DG CHEST 1V PORT
1 series · 1 of 1 positions shown · non-contrast
Comparison: 06/26/1949

CLINICAL DATA: Pneumothorax

EXAM:
PORTABLE CHEST - 1 VIEW

[AP]
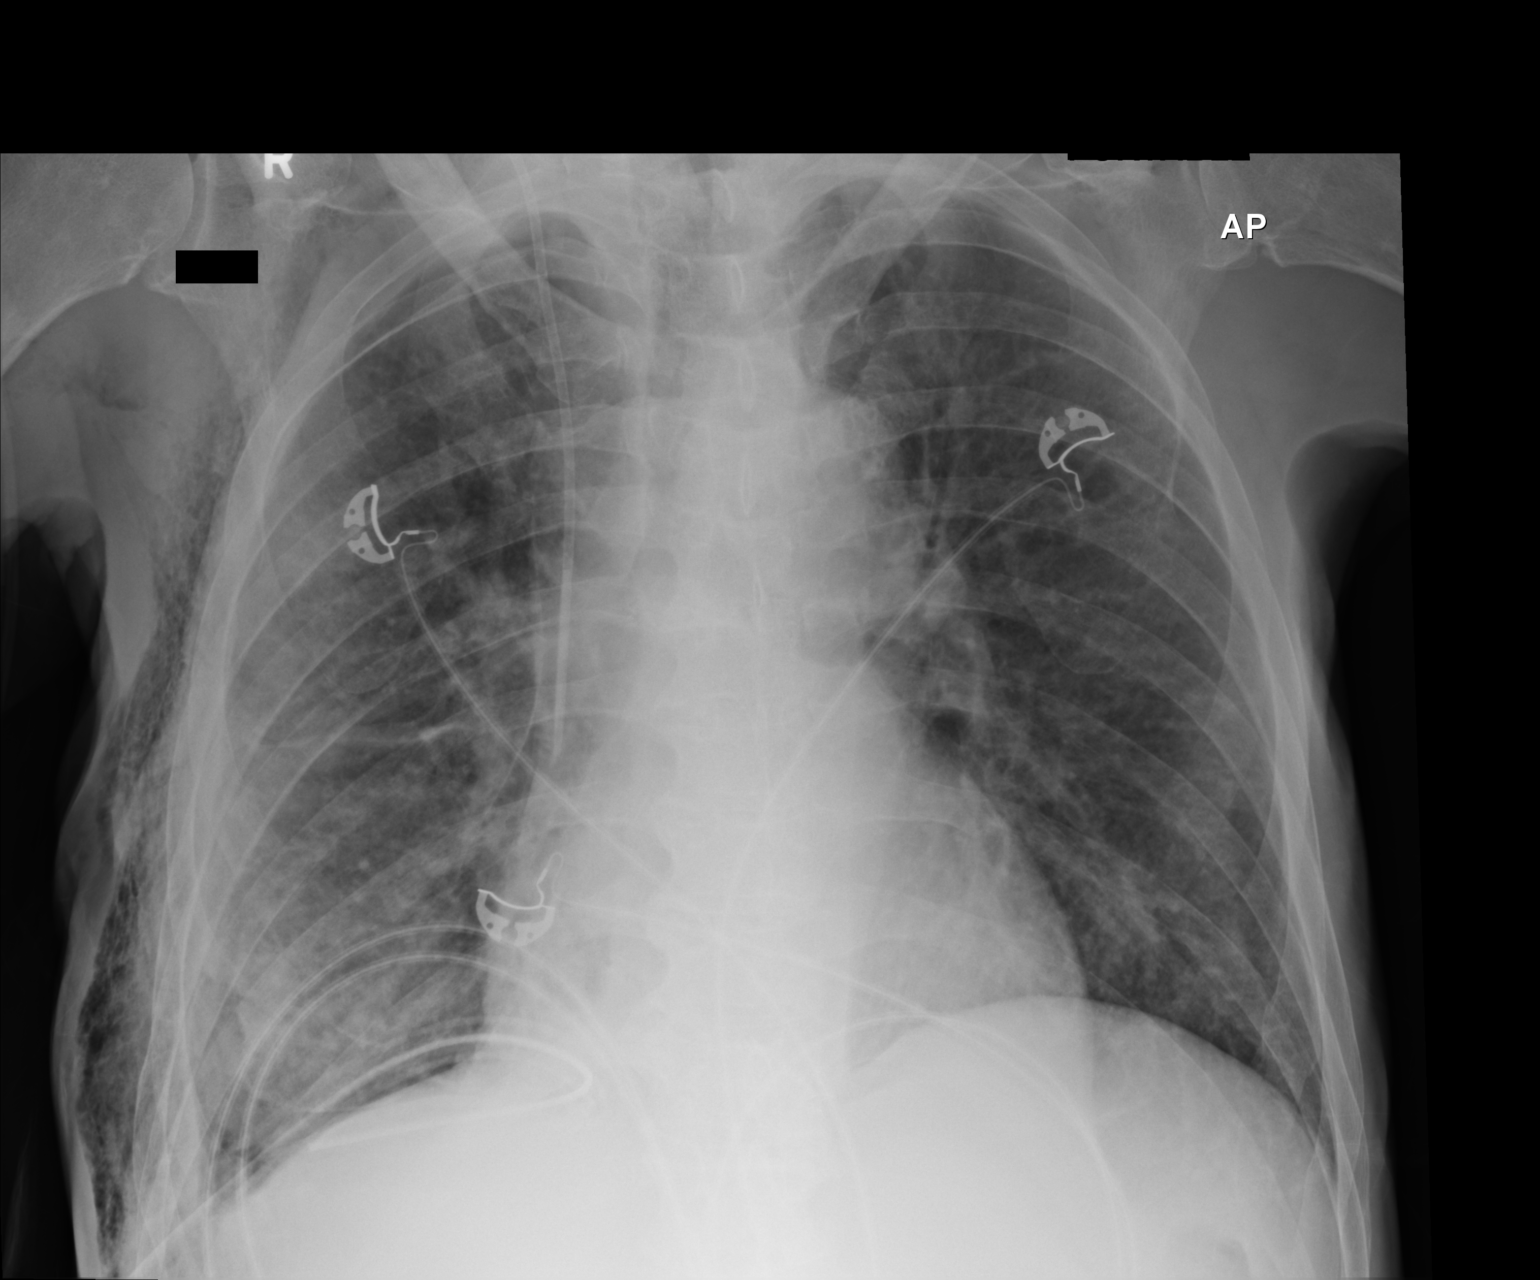

[1 of 1 positions shown; findings below may reference images not displayed]

FINDINGS: Cardiac shadow is stable. A a right-sided central venous line is
again seen. Small right-sided pneumothorax is again noted. There is
been interval removal of 1 of the 2 residual chest tubes. Persistent
right basilar infiltrate is seen. The left lung remains clear.
IMPRESSION: Interval removal of one right chest tube. One right chest tube
remains in the base. The right-sided pneumothorax is stable from the
prior exam. Remainder of the study is stable.

## 2016-07-18 IMAGING — US US THORACENTESIS ASP PLEURAL SPACE W/IMG GUIDE
1 series · 2 of 2 positions shown · non-contrast
Comparison: None.

MEDICATIONS:
None

COMPLICATIONS:
None immediate

INDICATION: Patient with history of lung and thyroid cancers, dyspnea, left
pleural effusion. Request is made for diagnostic and therapeutic
left thoracentesis.

EXAM:
ULTRASOUND GUIDED DIAGNOSTIC AND THERAPEUTIC LEFT THORACENTESIS
TECHNIQUE: Informed written consent was obtained from the patient after a
discussion of the risks, benefits and alternatives to treatment. A
timeout was performed prior to the initiation of the procedure.

[Series 1: us thoracentesis asp pleural space w/img guide · 0.27mm/px · 2 of 2 slices shown]
[im 1/2]
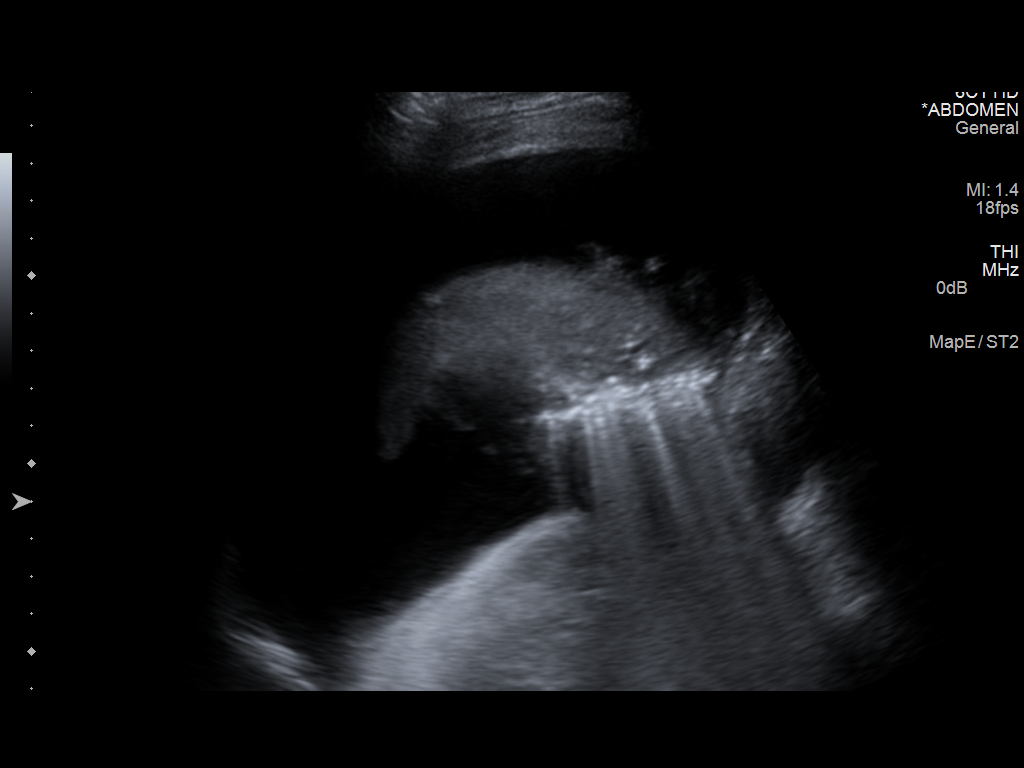
[im 2/2]
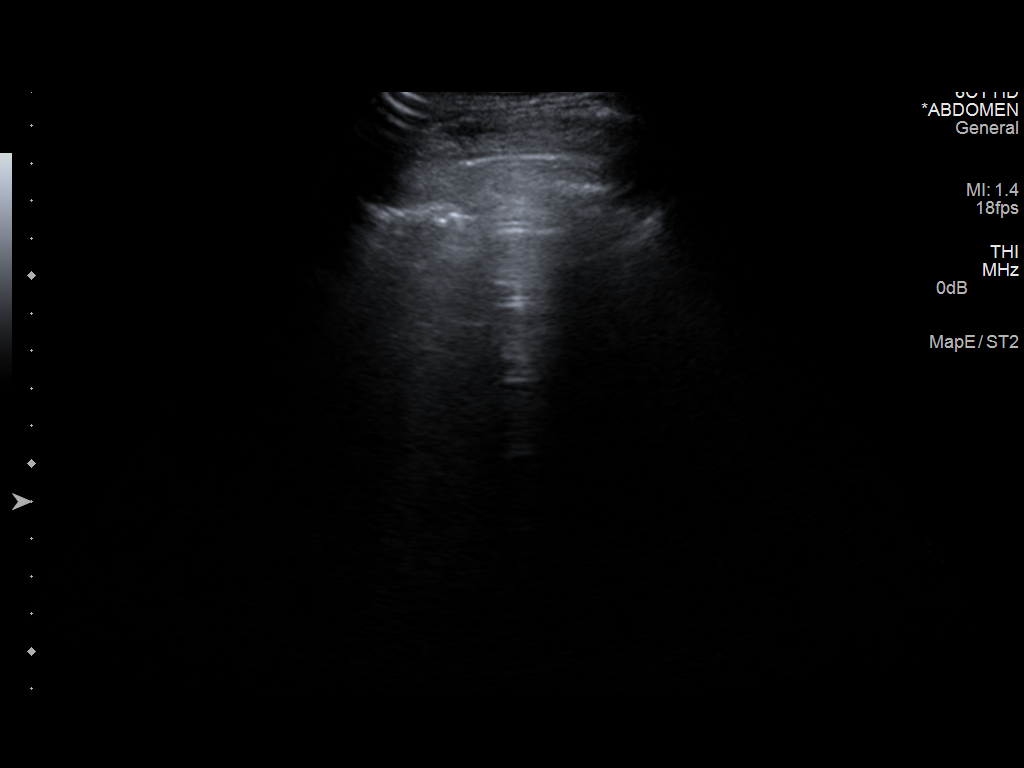

[2 of 2 positions shown; findings below may reference images not displayed]

Initial ultrasound scanning demonstrates a moderate to large left
pleural effusion. The lower chest was prepped and draped in the
usual sterile fashion. 1% lidocaine was used for local anesthesia.

An ultrasound image was saved for documentation purposes. A 6 Fr
Safe-T-Centesis catheter was introduced. The thoracentesis was
performed. The catheter was removed and a dressing was applied. The
patient tolerated the procedure well without immediate post
procedural complication. The patient was escorted to have an upright
chest radiograph.
FINDINGS: A total of approximately 1 liter of turbid, dark bloody fluid was
removed. Requested samples were sent to the laboratory.
IMPRESSION: Successful ultrasound-guided diagnostic/therapeutic left sided
thoracentesis yielding 1 liter of pleural fluid.

## 2016-07-19 IMAGING — US US PARACENTESIS
1 series · 7 of 7 positions shown · non-contrast
Comparison: None.

MEDICATIONS:
None.

COMPLICATIONS:
None immediate

INDICATION: Ascites, request for diagnostic and therapeutic paracentesis.

EXAM:
ULTRASOUND-GUIDED PARACENTESIS
TECHNIQUE: Informed written consent was obtained from the patient after a
discussion of the risks, benefits and alternatives to treatment. A
timeout was performed prior to the initiation of the procedure.

[Series 1: us paracentesis · 0.27mm/px · 7 of 7 slices shown]
[im 1/7]
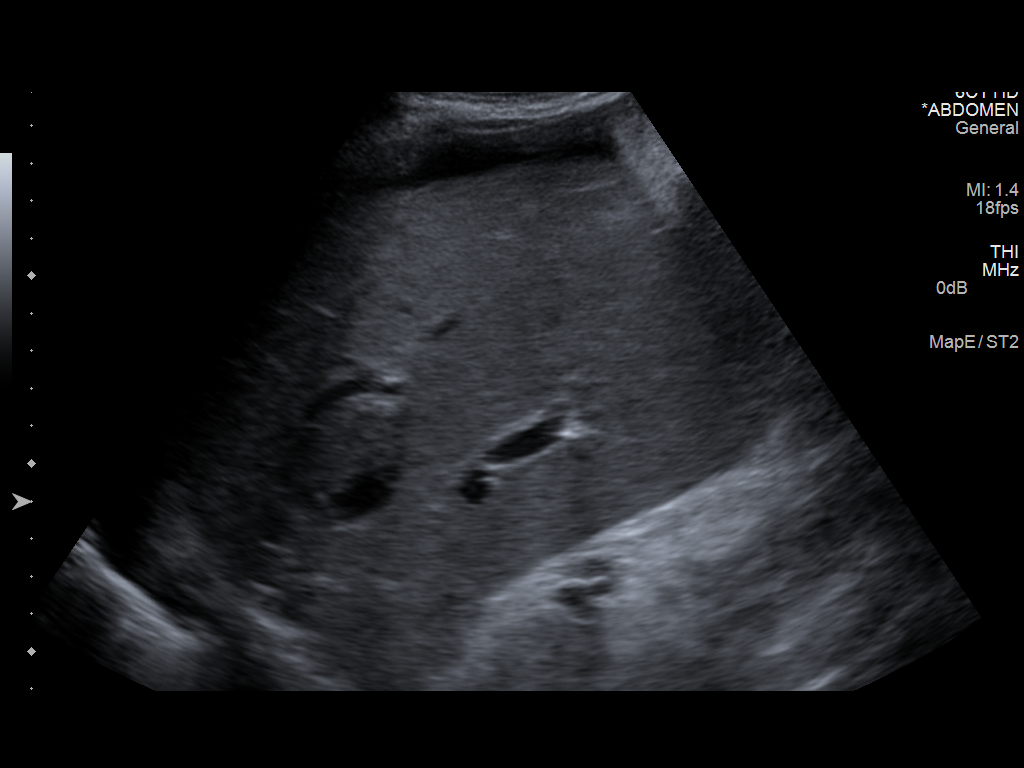
[im 2/7]
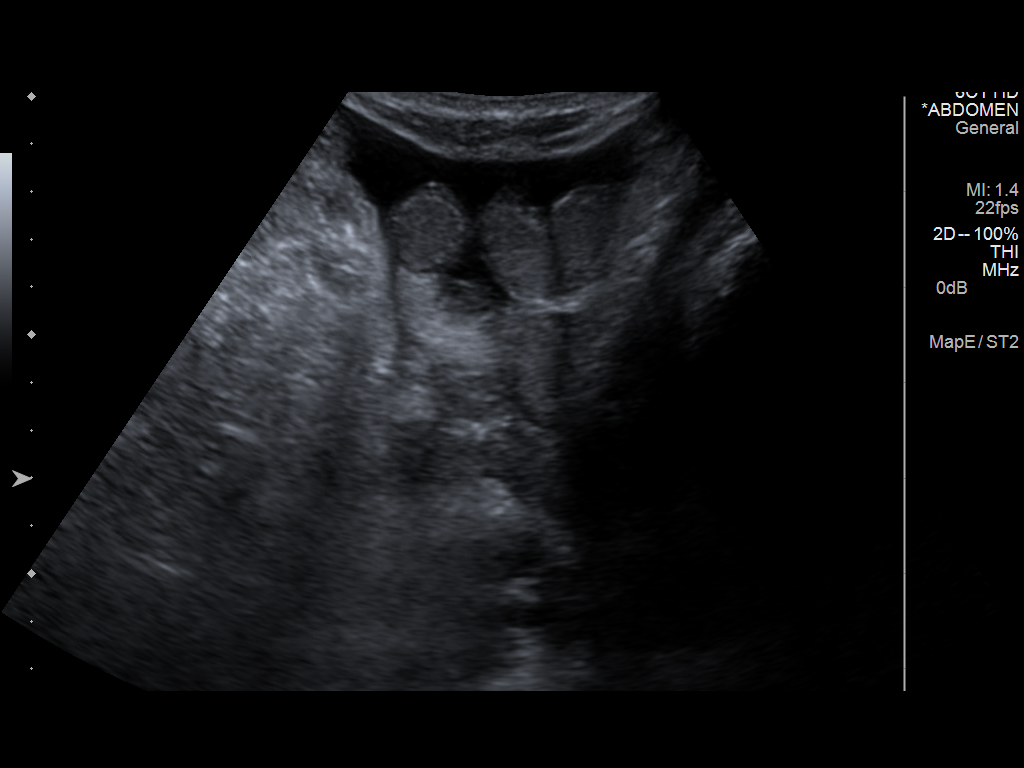
[im 3/7]
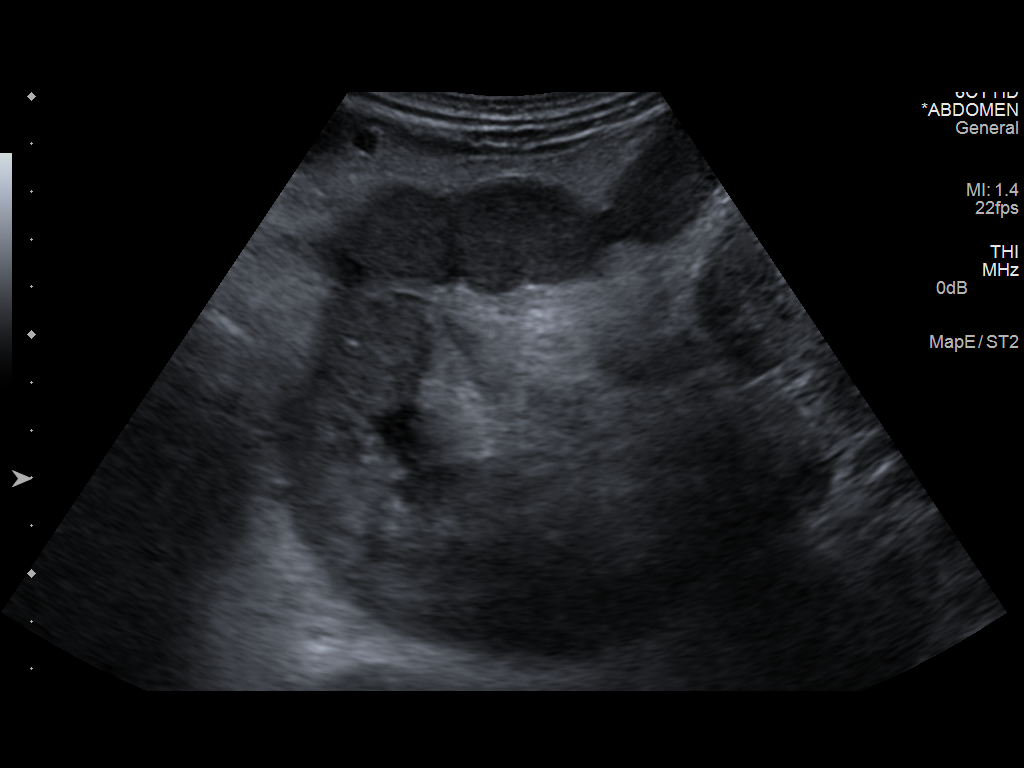
[im 4/7]
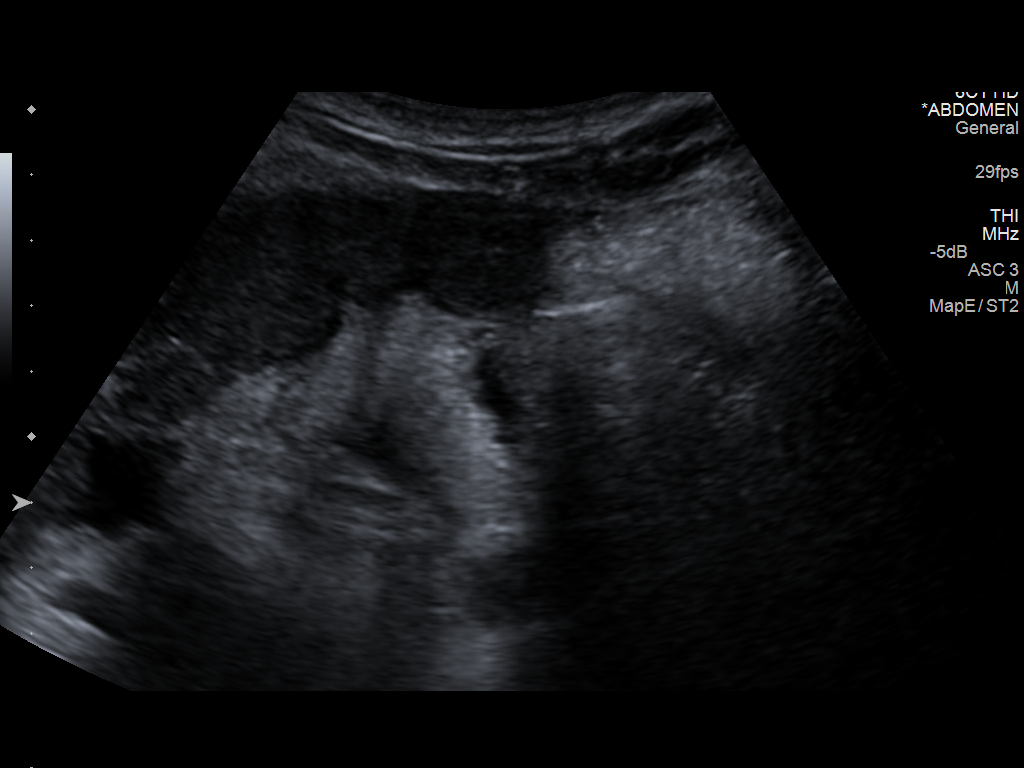
[im 5/7]
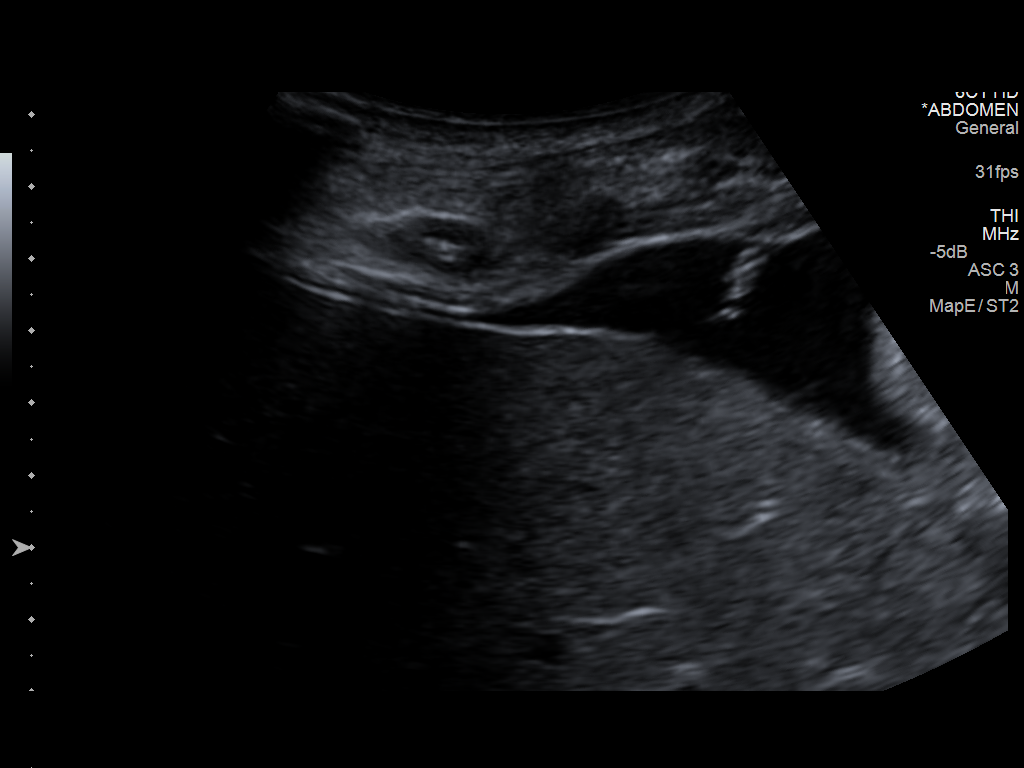
[im 6/7]
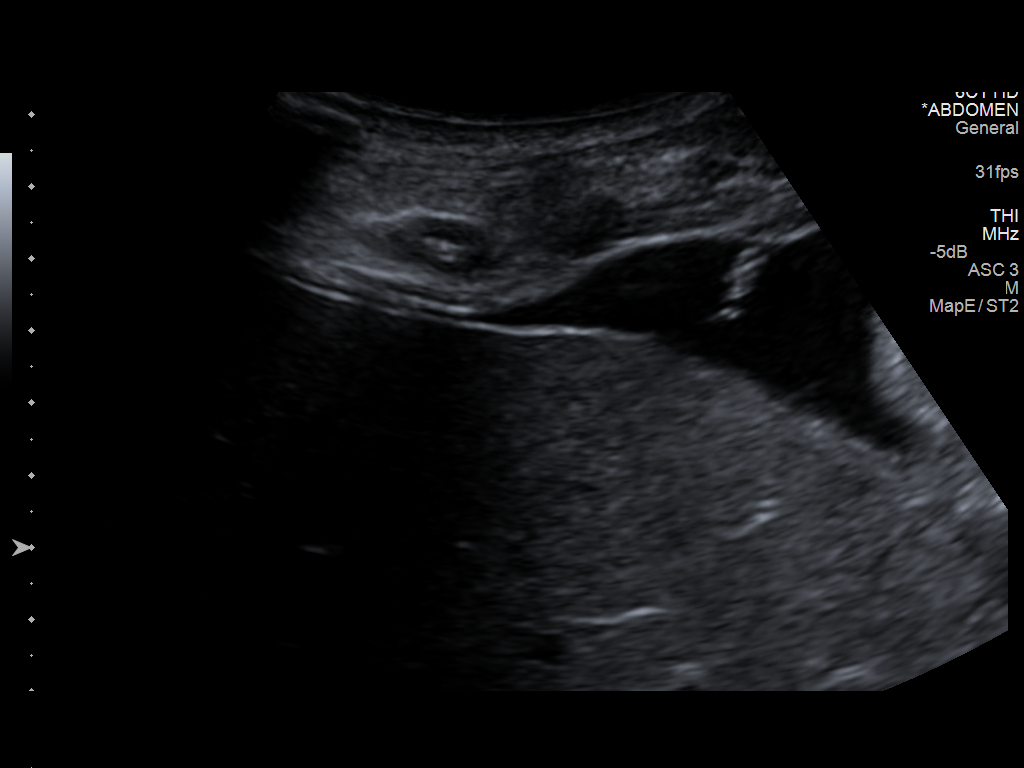
[im 7/7]
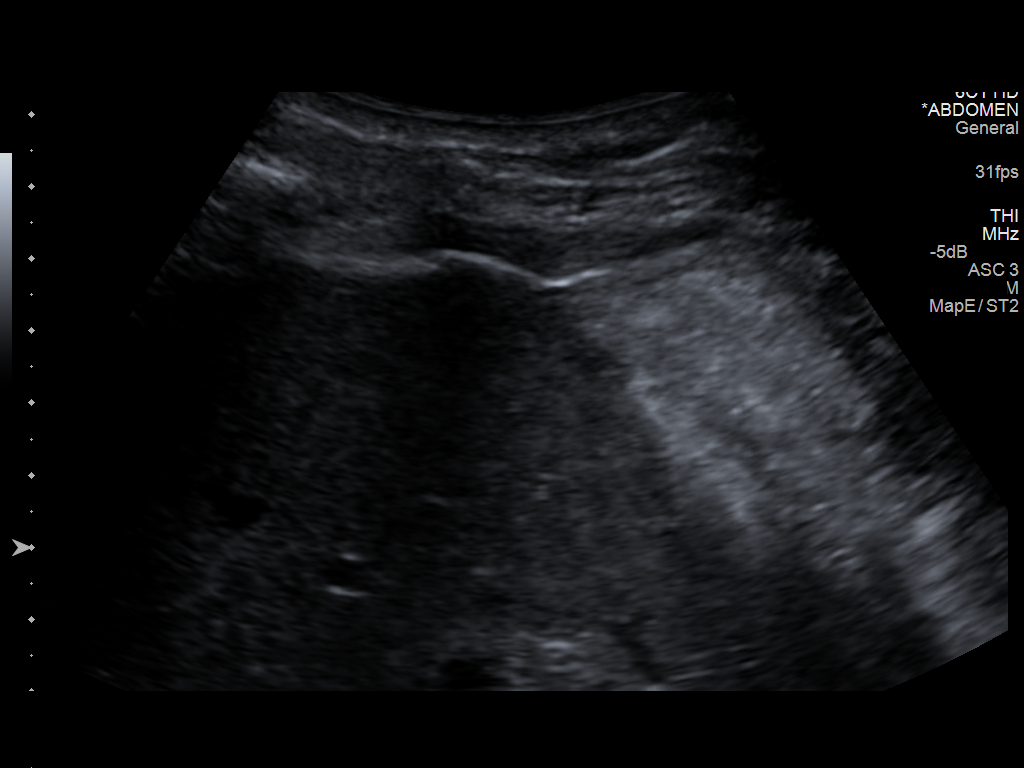

[7 of 7 positions shown; findings below may reference images not displayed]

Initial ultrasound scanning demonstrates a small amount of ascites
within the right upper abdominal quadrant. The right upper abdomen
was prepped and draped in the usual sterile fashion. 1% lidocaine
was used for local anesthesia.

Under direct ultrasound guidance, a 19 gauge, 7-cm, Yueh catheter
was introduced. An ultrasound image was saved for documentation
purposed. The paracentesis was performed. The catheter was removed
and a dressing was applied. The patient tolerated the procedure well
without immediate post procedural complication.
FINDINGS: A total of approximately 600 ml of serous fluid was removed. Samples
were sent to the laboratory as requested by the clinical team.
IMPRESSION: Successful ultrasound-guided paracentesis yielding 600 ml of
peritoneal fluid.
